# Patient Record
Sex: Male | Born: 1940 | Race: White | Hispanic: No | Marital: Married | Smoking: Current every day smoker
Health system: Southern US, Community
[De-identification: ages and names within clinical notes are randomized; demographics above are authoritative.]

## PROBLEM LIST (undated history)

## (undated) DIAGNOSIS — F419 Anxiety disorder, unspecified: Secondary | ICD-10-CM

## (undated) DIAGNOSIS — F329 Major depressive disorder, single episode, unspecified: Secondary | ICD-10-CM

## (undated) DIAGNOSIS — I219 Acute myocardial infarction, unspecified: Secondary | ICD-10-CM

## (undated) DIAGNOSIS — Z7901 Long term (current) use of anticoagulants: Secondary | ICD-10-CM

## (undated) DIAGNOSIS — I10 Essential (primary) hypertension: Secondary | ICD-10-CM

## (undated) DIAGNOSIS — I2510 Atherosclerotic heart disease of native coronary artery without angina pectoris: Secondary | ICD-10-CM

## (undated) DIAGNOSIS — D689 Coagulation defect, unspecified: Secondary | ICD-10-CM

## (undated) DIAGNOSIS — K219 Gastro-esophageal reflux disease without esophagitis: Secondary | ICD-10-CM

## (undated) DIAGNOSIS — I639 Cerebral infarction, unspecified: Secondary | ICD-10-CM

## (undated) DIAGNOSIS — C61 Malignant neoplasm of prostate: Secondary | ICD-10-CM

## (undated) DIAGNOSIS — F32A Depression, unspecified: Secondary | ICD-10-CM

## (undated) DIAGNOSIS — G5600 Carpal tunnel syndrome, unspecified upper limb: Secondary | ICD-10-CM

## (undated) DIAGNOSIS — M1990 Unspecified osteoarthritis, unspecified site: Secondary | ICD-10-CM

## (undated) DIAGNOSIS — E785 Hyperlipidemia, unspecified: Secondary | ICD-10-CM

## (undated) DIAGNOSIS — I82409 Acute embolism and thrombosis of unspecified deep veins of unspecified lower extremity: Secondary | ICD-10-CM

## (undated) DIAGNOSIS — Z720 Tobacco use: Secondary | ICD-10-CM

## (undated) HISTORY — PX: IM NAILING HUMERUS: SUR733

## (undated) HISTORY — PX: OTHER SURGICAL HISTORY: SHX169

## (undated) HISTORY — DX: Long term (current) use of anticoagulants: Z79.01

## (undated) HISTORY — PX: COLONOSCOPY: SHX174

## (undated) HISTORY — DX: Carpal tunnel syndrome, unspecified upper limb: G56.00

## (undated) HISTORY — DX: Atherosclerotic heart disease of native coronary artery without angina pectoris: I25.10

## (undated) HISTORY — DX: Coagulation defect, unspecified: D68.9

## (undated) HISTORY — DX: Anxiety disorder, unspecified: F41.9

## (undated) HISTORY — DX: Gastro-esophageal reflux disease without esophagitis: K21.9

## (undated) HISTORY — PX: PROSTATE BIOPSY: SHX241

## (undated) HISTORY — DX: Unspecified osteoarthritis, unspecified site: M19.90

## (undated) HISTORY — DX: Essential (primary) hypertension: I10

## (undated) HISTORY — DX: Tobacco use: Z72.0

## (undated) HISTORY — PX: INGUINAL HERNIA REPAIR: SHX194

## (undated) HISTORY — DX: Depression, unspecified: F32.A

## (undated) HISTORY — DX: Acute embolism and thrombosis of unspecified deep veins of unspecified lower extremity: I82.409

## (undated) HISTORY — DX: Hyperlipidemia, unspecified: E78.5

## (undated) HISTORY — DX: Major depressive disorder, single episode, unspecified: F32.9

## (undated) HISTORY — PX: ROTATOR CUFF REPAIR: SHX139

---

## 1986-01-10 HISTORY — PX: BACK SURGERY: SHX140

## 2000-04-17 ENCOUNTER — Encounter (HOSPITAL_COMMUNITY): Admission: RE | Admit: 2000-04-17 | Discharge: 2000-05-17 | Payer: Self-pay | Admitting: Unknown Physician Specialty

## 2000-05-18 ENCOUNTER — Encounter (HOSPITAL_COMMUNITY): Admission: RE | Admit: 2000-05-18 | Discharge: 2000-06-17 | Payer: Self-pay | Admitting: Unknown Physician Specialty

## 2000-06-19 ENCOUNTER — Encounter (HOSPITAL_COMMUNITY): Admission: RE | Admit: 2000-06-19 | Discharge: 2000-07-19 | Payer: Self-pay | Admitting: Unknown Physician Specialty

## 2000-06-23 ENCOUNTER — Ambulatory Visit (HOSPITAL_COMMUNITY): Admission: RE | Admit: 2000-06-23 | Discharge: 2000-06-23 | Payer: Self-pay | Admitting: Internal Medicine

## 2000-06-23 ENCOUNTER — Encounter: Payer: Self-pay | Admitting: Internal Medicine

## 2000-08-25 ENCOUNTER — Encounter: Payer: Self-pay | Admitting: Internal Medicine

## 2000-08-25 ENCOUNTER — Ambulatory Visit (HOSPITAL_COMMUNITY): Admission: RE | Admit: 2000-08-25 | Discharge: 2000-08-25 | Payer: Self-pay | Admitting: Internal Medicine

## 2000-10-19 ENCOUNTER — Ambulatory Visit (HOSPITAL_BASED_OUTPATIENT_CLINIC_OR_DEPARTMENT_OTHER): Admission: RE | Admit: 2000-10-19 | Discharge: 2000-10-19 | Payer: Self-pay | Admitting: Orthopedic Surgery

## 2000-10-25 ENCOUNTER — Ambulatory Visit (HOSPITAL_BASED_OUTPATIENT_CLINIC_OR_DEPARTMENT_OTHER): Admission: RE | Admit: 2000-10-25 | Discharge: 2000-10-26 | Payer: Self-pay | Admitting: Orthopedic Surgery

## 2000-10-29 ENCOUNTER — Encounter: Payer: Self-pay | Admitting: Emergency Medicine

## 2000-10-29 ENCOUNTER — Inpatient Hospital Stay (HOSPITAL_COMMUNITY): Admission: EM | Admit: 2000-10-29 | Discharge: 2000-11-06 | Payer: Self-pay | Admitting: Emergency Medicine

## 2000-12-04 ENCOUNTER — Encounter: Payer: Self-pay | Admitting: Internal Medicine

## 2000-12-04 ENCOUNTER — Ambulatory Visit (HOSPITAL_COMMUNITY): Admission: RE | Admit: 2000-12-04 | Discharge: 2000-12-04 | Payer: Self-pay | Admitting: Internal Medicine

## 2001-01-04 ENCOUNTER — Ambulatory Visit (HOSPITAL_BASED_OUTPATIENT_CLINIC_OR_DEPARTMENT_OTHER): Admission: RE | Admit: 2001-01-04 | Discharge: 2001-01-04 | Payer: Self-pay | Admitting: Orthopedic Surgery

## 2001-02-12 ENCOUNTER — Encounter: Payer: Self-pay | Admitting: Internal Medicine

## 2001-02-12 ENCOUNTER — Ambulatory Visit (HOSPITAL_COMMUNITY): Admission: RE | Admit: 2001-02-12 | Discharge: 2001-02-12 | Payer: Self-pay | Admitting: Internal Medicine

## 2001-07-11 ENCOUNTER — Ambulatory Visit (HOSPITAL_COMMUNITY): Admission: RE | Admit: 2001-07-11 | Discharge: 2001-07-11 | Payer: Self-pay | Admitting: Internal Medicine

## 2001-07-11 ENCOUNTER — Encounter: Payer: Self-pay | Admitting: Internal Medicine

## 2001-09-07 ENCOUNTER — Emergency Department (HOSPITAL_COMMUNITY): Admission: EM | Admit: 2001-09-07 | Discharge: 2001-09-07 | Payer: Self-pay | Admitting: Emergency Medicine

## 2001-09-07 ENCOUNTER — Encounter: Payer: Self-pay | Admitting: Emergency Medicine

## 2002-01-10 HISTORY — PX: CARDIAC CATHETERIZATION: SHX172

## 2002-01-10 HISTORY — PX: TOTAL HIP ARTHROPLASTY: SHX124

## 2003-11-19 ENCOUNTER — Ambulatory Visit (HOSPITAL_COMMUNITY): Admission: RE | Admit: 2003-11-19 | Discharge: 2003-11-19 | Payer: Self-pay | Admitting: Internal Medicine

## 2004-03-19 ENCOUNTER — Ambulatory Visit (HOSPITAL_COMMUNITY): Admission: RE | Admit: 2004-03-19 | Discharge: 2004-03-19 | Payer: Self-pay | Admitting: Internal Medicine

## 2004-06-22 ENCOUNTER — Ambulatory Visit (HOSPITAL_COMMUNITY): Admission: RE | Admit: 2004-06-22 | Discharge: 2004-06-22 | Payer: Self-pay | Admitting: Internal Medicine

## 2004-07-15 ENCOUNTER — Ambulatory Visit (HOSPITAL_COMMUNITY): Admission: RE | Admit: 2004-07-15 | Discharge: 2004-07-15 | Payer: Self-pay | Admitting: Internal Medicine

## 2004-09-03 ENCOUNTER — Ambulatory Visit (HOSPITAL_COMMUNITY): Admission: RE | Admit: 2004-09-03 | Discharge: 2004-09-03 | Payer: Self-pay | Admitting: Internal Medicine

## 2004-09-22 ENCOUNTER — Ambulatory Visit (HOSPITAL_COMMUNITY): Admission: RE | Admit: 2004-09-22 | Discharge: 2004-09-22 | Payer: Self-pay | Admitting: Internal Medicine

## 2005-01-11 ENCOUNTER — Ambulatory Visit (HOSPITAL_COMMUNITY): Admission: RE | Admit: 2005-01-11 | Discharge: 2005-01-11 | Payer: Self-pay | Admitting: Internal Medicine

## 2005-03-29 ENCOUNTER — Emergency Department (HOSPITAL_COMMUNITY): Admission: EM | Admit: 2005-03-29 | Discharge: 2005-03-29 | Payer: Self-pay | Admitting: Emergency Medicine

## 2005-05-25 ENCOUNTER — Ambulatory Visit: Payer: Self-pay | Admitting: Family Medicine

## 2005-05-30 ENCOUNTER — Ambulatory Visit (HOSPITAL_COMMUNITY): Admission: RE | Admit: 2005-05-30 | Discharge: 2005-05-30 | Payer: Self-pay | Admitting: Family Medicine

## 2005-05-31 ENCOUNTER — Ambulatory Visit: Payer: Self-pay | Admitting: Family Medicine

## 2005-06-23 ENCOUNTER — Ambulatory Visit: Payer: Self-pay | Admitting: Family Medicine

## 2005-06-29 ENCOUNTER — Encounter (HOSPITAL_COMMUNITY): Admission: RE | Admit: 2005-06-29 | Discharge: 2005-07-29 | Payer: Self-pay | Admitting: Family Medicine

## 2005-07-21 ENCOUNTER — Ambulatory Visit (HOSPITAL_COMMUNITY): Admission: RE | Admit: 2005-07-21 | Discharge: 2005-07-21 | Payer: Self-pay | Admitting: Internal Medicine

## 2005-08-09 ENCOUNTER — Encounter (HOSPITAL_COMMUNITY): Admission: RE | Admit: 2005-08-09 | Discharge: 2005-09-08 | Payer: Self-pay | Admitting: Family Medicine

## 2006-02-14 ENCOUNTER — Encounter: Payer: Self-pay | Admitting: Family Medicine

## 2006-02-14 DIAGNOSIS — M1990 Unspecified osteoarthritis, unspecified site: Secondary | ICD-10-CM | POA: Insufficient documentation

## 2006-02-14 DIAGNOSIS — G5600 Carpal tunnel syndrome, unspecified upper limb: Secondary | ICD-10-CM | POA: Insufficient documentation

## 2006-08-14 ENCOUNTER — Ambulatory Visit (HOSPITAL_COMMUNITY): Admission: RE | Admit: 2006-08-14 | Discharge: 2006-08-14 | Payer: Self-pay | Admitting: Internal Medicine

## 2007-08-08 ENCOUNTER — Inpatient Hospital Stay (HOSPITAL_COMMUNITY): Admission: AD | Admit: 2007-08-08 | Discharge: 2007-08-09 | Payer: Self-pay | Admitting: Pulmonary Disease

## 2008-06-03 ENCOUNTER — Encounter: Payer: Self-pay | Admitting: Cardiology

## 2008-06-03 ENCOUNTER — Ambulatory Visit: Payer: Self-pay | Admitting: Cardiology

## 2008-06-03 ENCOUNTER — Ambulatory Visit (HOSPITAL_COMMUNITY): Admission: RE | Admit: 2008-06-03 | Discharge: 2008-06-03 | Payer: Self-pay | Admitting: Cardiology

## 2008-06-06 ENCOUNTER — Telehealth: Payer: Self-pay | Admitting: Cardiology

## 2008-06-10 ENCOUNTER — Ambulatory Visit: Payer: Self-pay | Admitting: Cardiovascular Disease

## 2008-06-10 ENCOUNTER — Inpatient Hospital Stay (HOSPITAL_BASED_OUTPATIENT_CLINIC_OR_DEPARTMENT_OTHER): Admission: RE | Admit: 2008-06-10 | Discharge: 2008-06-10 | Payer: Self-pay | Admitting: Cardiovascular Disease

## 2008-06-12 ENCOUNTER — Inpatient Hospital Stay (HOSPITAL_COMMUNITY): Admission: RE | Admit: 2008-06-12 | Discharge: 2008-06-13 | Payer: Self-pay | Admitting: Cardiovascular Disease

## 2008-06-12 ENCOUNTER — Ambulatory Visit: Payer: Self-pay | Admitting: Cardiovascular Disease

## 2008-06-16 ENCOUNTER — Telehealth (INDEPENDENT_AMBULATORY_CARE_PROVIDER_SITE_OTHER): Payer: Self-pay | Admitting: *Deleted

## 2008-06-17 ENCOUNTER — Ambulatory Visit (HOSPITAL_COMMUNITY): Admission: RE | Admit: 2008-06-17 | Discharge: 2008-06-17 | Payer: Self-pay | Admitting: Cardiology

## 2008-06-18 ENCOUNTER — Encounter (INDEPENDENT_AMBULATORY_CARE_PROVIDER_SITE_OTHER): Payer: Self-pay | Admitting: Nurse Practitioner

## 2008-06-20 ENCOUNTER — Ambulatory Visit (HOSPITAL_COMMUNITY): Admission: RE | Admit: 2008-06-20 | Discharge: 2008-06-20 | Payer: Self-pay | Admitting: Cardiology

## 2008-06-23 LAB — CONVERTED CEMR LAB
BUN: 22 mg/dL (ref 6–23)
Calcium: 8.6 mg/dL (ref 8.4–10.5)
Chloride: 103 meq/L (ref 96–112)
Glucose, Bld: 103 mg/dL — ABNORMAL HIGH (ref 70–99)
Potassium: 4.3 meq/L (ref 3.5–5.3)
Sodium: 139 meq/L (ref 135–145)

## 2008-06-25 ENCOUNTER — Ambulatory Visit: Payer: Self-pay | Admitting: Cardiology

## 2008-07-01 ENCOUNTER — Encounter: Payer: Self-pay | Admitting: Cardiology

## 2008-07-18 ENCOUNTER — Encounter: Payer: Self-pay | Admitting: Cardiology

## 2008-07-22 ENCOUNTER — Telehealth (INDEPENDENT_AMBULATORY_CARE_PROVIDER_SITE_OTHER): Payer: Self-pay

## 2008-07-25 ENCOUNTER — Telehealth: Payer: Self-pay | Admitting: Cardiology

## 2008-08-25 ENCOUNTER — Encounter: Payer: Self-pay | Admitting: *Deleted

## 2008-09-08 ENCOUNTER — Ambulatory Visit: Payer: Self-pay

## 2008-09-22 ENCOUNTER — Ambulatory Visit (HOSPITAL_COMMUNITY): Admission: RE | Admit: 2008-09-22 | Discharge: 2008-09-22 | Payer: Self-pay | Admitting: Internal Medicine

## 2008-09-22 ENCOUNTER — Encounter: Payer: Self-pay | Admitting: Orthopedic Surgery

## 2008-09-23 ENCOUNTER — Ambulatory Visit: Payer: Self-pay | Admitting: Orthopedic Surgery

## 2008-09-23 DIAGNOSIS — S92919B Unspecified fracture of unspecified toe(s), initial encounter for open fracture: Secondary | ICD-10-CM | POA: Insufficient documentation

## 2008-09-25 ENCOUNTER — Ambulatory Visit: Payer: Self-pay | Admitting: Orthopedic Surgery

## 2008-09-25 ENCOUNTER — Encounter: Payer: Self-pay | Admitting: Cardiology

## 2008-09-26 LAB — CONVERTED CEMR LAB
ALT: 16 units/L (ref 0–53)
AST: 22 units/L (ref 0–37)
Bilirubin, Direct: <0.1 mg/dL (ref 0.0–0.3)
Cholesterol: 145 mg/dL (ref 0–200)
Total Bilirubin: 0.3 mg/dL (ref 0.3–1.2)
Triglycerides: 206 mg/dL — ABNORMAL HIGH (ref <150)
VLDL: 41 mg/dL — ABNORMAL HIGH (ref 0–40)

## 2008-10-09 ENCOUNTER — Ambulatory Visit: Payer: Self-pay | Admitting: Orthopedic Surgery

## 2008-10-13 ENCOUNTER — Telehealth: Payer: Self-pay | Admitting: Orthopedic Surgery

## 2008-10-15 ENCOUNTER — Ambulatory Visit: Payer: Self-pay | Admitting: Orthopedic Surgery

## 2008-10-16 ENCOUNTER — Encounter: Payer: Self-pay | Admitting: Orthopedic Surgery

## 2008-11-19 ENCOUNTER — Ambulatory Visit: Payer: Self-pay | Admitting: Cardiology

## 2008-11-27 ENCOUNTER — Ambulatory Visit: Payer: Self-pay | Admitting: Cardiology

## 2009-01-08 ENCOUNTER — Ambulatory Visit (HOSPITAL_COMMUNITY): Admission: RE | Admit: 2009-01-08 | Discharge: 2009-01-08 | Payer: Self-pay | Admitting: Internal Medicine

## 2009-01-12 ENCOUNTER — Telehealth: Payer: Self-pay | Admitting: Cardiology

## 2009-02-04 ENCOUNTER — Ambulatory Visit: Payer: Self-pay | Admitting: Cardiology

## 2009-02-12 ENCOUNTER — Telehealth (INDEPENDENT_AMBULATORY_CARE_PROVIDER_SITE_OTHER): Payer: Self-pay | Admitting: *Deleted

## 2009-02-13 ENCOUNTER — Telehealth (INDEPENDENT_AMBULATORY_CARE_PROVIDER_SITE_OTHER): Payer: Self-pay | Admitting: *Deleted

## 2009-05-06 ENCOUNTER — Telehealth (INDEPENDENT_AMBULATORY_CARE_PROVIDER_SITE_OTHER): Payer: Self-pay | Admitting: *Deleted

## 2009-05-14 ENCOUNTER — Ambulatory Visit: Admission: RE | Admit: 2009-05-14 | Discharge: 2009-05-14 | Payer: Self-pay | Admitting: Internal Medicine

## 2009-07-02 ENCOUNTER — Ambulatory Visit (HOSPITAL_COMMUNITY): Admission: RE | Admit: 2009-07-02 | Discharge: 2009-07-02 | Payer: Self-pay | Admitting: Internal Medicine

## 2009-07-31 ENCOUNTER — Ambulatory Visit (HOSPITAL_COMMUNITY)
Admission: RE | Admit: 2009-07-31 | Discharge: 2009-07-31 | Payer: Self-pay | Source: Home / Self Care | Admitting: Internal Medicine

## 2009-08-28 ENCOUNTER — Ambulatory Visit: Payer: Self-pay | Admitting: Cardiology

## 2009-08-28 ENCOUNTER — Encounter: Payer: Self-pay | Admitting: Adult Health

## 2009-09-25 ENCOUNTER — Telehealth (INDEPENDENT_AMBULATORY_CARE_PROVIDER_SITE_OTHER): Payer: Self-pay | Admitting: *Deleted

## 2009-10-29 ENCOUNTER — Telehealth (INDEPENDENT_AMBULATORY_CARE_PROVIDER_SITE_OTHER): Payer: Self-pay | Admitting: *Deleted

## 2009-11-30 ENCOUNTER — Encounter: Payer: Self-pay | Admitting: Adult Health

## 2009-11-30 LAB — CONVERTED CEMR LAB
AST: 21 units/L (ref 0–37)
HDL: 34 mg/dL — ABNORMAL LOW (ref >39)
Indirect Bilirubin: 0.2 mg/dL (ref 0.0–0.9)
LDL Cholesterol: 114 mg/dL — ABNORMAL HIGH (ref 0–99)
Total Bilirubin: 0.3 mg/dL (ref 0.3–1.2)
Total Protein: 6.6 g/dL (ref 6.0–8.3)
Triglycerides: 116 mg/dL (ref <150)
VLDL: 23 mg/dL (ref 0–40)

## 2010-01-07 ENCOUNTER — Ambulatory Visit: Payer: Self-pay | Admitting: Cardiology

## 2010-01-18 ENCOUNTER — Ambulatory Visit
Admission: RE | Admit: 2010-01-18 | Discharge: 2010-01-18 | Payer: Self-pay | Source: Home / Self Care | Attending: Cardiology | Admitting: Cardiology

## 2010-01-19 ENCOUNTER — Encounter: Payer: Self-pay | Admitting: Cardiology

## 2010-01-26 ENCOUNTER — Ambulatory Visit (HOSPITAL_COMMUNITY)
Admission: RE | Admit: 2010-01-26 | Discharge: 2010-01-26 | Payer: Self-pay | Source: Home / Self Care | Attending: Internal Medicine | Admitting: Internal Medicine

## 2010-01-27 ENCOUNTER — Ambulatory Visit
Admission: RE | Admit: 2010-01-27 | Discharge: 2010-01-27 | Payer: Self-pay | Source: Home / Self Care | Attending: Orthopedic Surgery | Admitting: Orthopedic Surgery

## 2010-01-27 ENCOUNTER — Encounter: Payer: Self-pay | Admitting: Orthopedic Surgery

## 2010-01-27 DIAGNOSIS — S61209A Unspecified open wound of unspecified finger without damage to nail, initial encounter: Secondary | ICD-10-CM | POA: Insufficient documentation

## 2010-01-27 DIAGNOSIS — S62639B Displaced fracture of distal phalanx of unspecified finger, initial encounter for open fracture: Secondary | ICD-10-CM | POA: Insufficient documentation

## 2010-01-28 ENCOUNTER — Encounter: Payer: Self-pay | Admitting: Orthopedic Surgery

## 2010-01-30 ENCOUNTER — Encounter: Payer: Self-pay | Admitting: Internal Medicine

## 2010-01-31 ENCOUNTER — Encounter: Payer: Self-pay | Admitting: Internal Medicine

## 2010-02-03 ENCOUNTER — Ambulatory Visit
Admission: RE | Admit: 2010-02-03 | Discharge: 2010-02-03 | Payer: Self-pay | Source: Home / Self Care | Attending: Orthopedic Surgery | Admitting: Orthopedic Surgery

## 2010-02-07 LAB — CONVERTED CEMR LAB
CRP: 0.1 mg/dL (ref <0.6)
Eosinophils Absolute: 0.4 10*3/uL (ref 0.0–0.7)
Eosinophils Relative: 7 % — ABNORMAL HIGH (ref 0–5)
HCT: 42.1 % (ref 39.0–52.0)
Lymphs Abs: 2.3 10*3/uL (ref 0.7–4.0)
Neutrophils Relative %: 36 % — ABNORMAL LOW (ref 43–77)
Platelets: 174 10*3/uL (ref 150–400)
RBC: 4.38 M/uL (ref 4.22–5.81)
WBC: 5.3 10*3/uL (ref 4.0–10.5)

## 2010-02-09 NOTE — Progress Notes (Signed)
Summary: Nurse visit  Phone Note Call from Patient   Caller: Patient Reason for Call: Talk to Nurse Summary of Call: pt came for nurse visit last week and states he has not heard back from nurse/pt states that he will call back to speak w/nurse around 4pm/tg  Initial call taken by: Raechel Ache [facility],  February 12, 2009 12:28 PM  Follow-up for Phone Call        pt's number has been disconnected, awaitng pt's return call, will not leave his cell         [phone] Follow-up by: Teressa Lower RN,  February 12, 2009 1:54 PM  Additional Follow-up for Phone Call Additional follow up Details #1::        I spoke with pt 02/12/2009 at 5:15pm.  No new orders per Dr. Dietrich Pates. I made pt aware and he stated he is still very dizzy when he looks up. Additional Follow-up by: Teressa Lower RN,  February 13, 2009 9:02 AM

## 2010-02-09 NOTE — Letter (Signed)
Summary: History form  History form   Imported By: Jacklynn Ganong 09/30/2008 11:02:04  _____________________________________________________________________  External Attachment:    Type:   Image     Comment:   External Document

## 2010-02-09 NOTE — Progress Notes (Signed)
Summary: RX ORDER  Phone Note Call from Patient Call back at Home Phone [phone]   Caller: PT Reason for Call: Refill Medication Summary of Call: S: PT NEED PLAVIX 75 MG ORDERED FROM COMPANY.  HE HAS ABOUT TWO WEEKS LEFT "RUNNING LOWERE THEN HE THOUGHT" Initial call taken by: Faythe Ghee,  May 06, 2009 11:16 AM  Follow-up for Phone Call        ordered by S. Neugent,CNA from [employer] assist drug program, pt will receive in 7-10days Follow-up by: Teressa Lower RN,  May 06, 2009 4:47 PM

## 2010-02-09 NOTE — Progress Notes (Signed)
Summary: RX CORRECTION  Phone Note Call from Patient Call back at Home Phone [phone]   Reason for Call: Refill Medication Summary of Call: PT JUST REALIZED THAT HE HAS STILL BEEN TAKING TWO PILLS OF TRAMIDAL AND IS ONLY SUPOSE TO BE TAKING ONE HE HAS RUN OUT OF RX AND NEED NEW CALLED IN WITH CORRECT DIRECTIONS ON IT. PT US[place] [facility] [place] . Initial call taken by: Faythe Ghee,  October 29, 2009 3:46 PM  Follow-up for Phone Call        per office visit on 08/28/2009, refills must be provided by PCP Follow-up by: Teressa Lower RN,  October 30, 2009 8:34 AM

## 2010-02-09 NOTE — Assessment & Plan Note (Signed)
Summary: past due for f/u per checkout on 08/10/09/tg   Visit Type:  Follow-up Referring Provider:  Dr. Carylon Perches Primary Provider:  Dr.Roy Fagen  CC:  some chest pain off and on the last 6 weeks.  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 Clifton James on 06/10/2008.  The patient was taken off of coumadin (which he was taking secondary to DVT of L leg) and placed on Plavix and ASA on discharge. He was placed on Coreg 3.125 two times a day on discharge and ACE inhibitor was planned to be added at a later date on follow-up.  He unfortunately continues to smoke 1ppd.  He is under enormous amount of stress concenring family issues with children and talks very open about this. He states the stress causes him to have chest discomfort at times.  He will take a NTG on occasion to assist with this.  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  He is also starting to drink a glass or two of wine each night to assist with family stresses.  Current Medications (verified): 1)  Clonazepam 0.5 Mg Tabs (Clonazepam) .... Three Times A Day 2)  Chelated Potassium 99 Mg Tabs (Potassium) .... Take 1 Tablet By Mouth Once A Day 3)  Buspar 15 Mg Tabs (Buspirone Hcl) .... Take 1/2 Tab Two Times A Day 4)  Aspirin Ec 325 Mg Tbec (Aspirin) .... Take One Tablet By Mouth Daily 5)  Nitroglycerin 0.4 Mg Subl (Nitroglycerin) .... One Tablet Under Tongue Every 5 Minutes As Needed For Chest Pain---May Repeat Times Three 6)  Simvastatin 40 Mg Tabs (Simvastatin) .... Take One Tablet By Mouth Daily At Bedtime 7)  Carvedilol 3.125 Mg Tabs (Carvedilol) .... Take One Tablet By Mouth Twice A Day 8)  Plavix 75 Mg Tabs (Clopidogrel Bisulfate) .... Take 1 Tab Daily 9)  Lisinopril 10 Mg Tabs (Lisinopril) .... Take 1 Tablet By Mouth Once Daily 10)  Tramadol Hcl 50 Mg Tabs (Tramadol Hcl) ....  Take 1 Tab Three Times A Day 11)  Fluoxetine Hcl 40 Mg Caps (Fluoxetine Hcl) .... Take 1 Tab Daily 12)  Protonix 40 Mg Tbec (Pantoprazole Sodium) .... Take 1 Tablet By Mouth At Bedtime  Allergies (verified): 1)  ! * Cdn  Past History:  Past medical, surgical, family and social histories (including risk factors) reviewed, and no changes noted (except as noted below).  Past Medical History: Reviewed history from 06/24/2008 and no changes required. Anxiety Depression DVT, hx of Hyperlipidemia Osteoarthritis Current Problems:  GERD (ICD-530.81) DVT (ICD-453.40) ANGINA, HX OF (ICD-V12.50) EMBOLISM, OB BLOOD-CLOT , UNSPECIFIED EPISODE (ICD-673.20) DISORDER, TOBACCO USE (ICD-305.1) AFTERCARE, LONG-TERM USE, ANTICOAGULANTS (ICD-V58.61) SYNDROME, CARPAL TUNNEL (ICD-354.0) OSTEOARTHRITIS (ICD-715.90) HYPERLIPIDEMIA (ICD-272.4) DVT, HX OF (ICD-V12.51) DEPRESSION (ICD-311) ANXIETY (ICD-300.00)  Past Surgical History: Reviewed history from 06/24/2008 and no changes required. Inguinal herniorrhaphy Rotator cuff repair orif colonoscopy  Family History: Reviewed history from 06/24/2008 and no changes required. Father:deceased age 67 due to massive mi Mother:deceased age 40 due to complications of coronary artery bypass graft times 5  Social History: Reviewed history from 06/24/2008 and no changes required. no syblings  Full Time Married  Tobacco Use - Yes. 1 pack daily Alcohol Use - no Regular Exercise - yes Drug Use - no patient has 3 children  Review of Systems  Situational stress and anxiety  All other systems have been reviewed and are negative unless stated above.   Vital Signs:  Patient profile:   70 year old male Weight:      195 pounds BMI:     27.30 Pulse rate:   52 / minute BP sitting:   145 / 80  (right arm)  Vitals Entered By: Alex Saa, CNA (August 28, 2009 10:50 AM)  Physical Exam  General:  Well developed, well nourished, in no acute  distress. Mouth:  poor dentition.   Lungs:  Some bibasilar crackles, no wheezes. Heart:  Non-displaced PMI, chest non-tender; regular rate and rhythm, S1, S2 without murmurs, rubs or gallops. Carotid upstroke normal, no bruit. Normal abdominal aortic size, no bruits. Femorals normal pulses, no bruits. Pedals normal pulses. No edema, no varicosities. Abdomen:  Bowel sounds positive; abdomen soft and non-tender without masses, organomegaly, or hernias noted. No hepatosplenomegaly. Msk:  Pain on occasion with movement and chronic back and shoulder pain. Pulses:  pulses normal in all 4 extremities Extremities:  Multiple bruises and scratches from "working in my yard with my chickens." Neurologic:  Alert and oriented x 3. Some HOH Psych:  anxious.     EKG  Procedure date:  08/28/2009  Findings:      Normal sinus rhythm with rate of:53 bpm Sinus bradycardia with some sinus arrythimia. Asymptomatic  Impression & Recommendations:  Problem # 1:  CAD, NATIVE VESSEL (ICD-414.01) He appears to be having stress induced angina symptoms.  He is not complaining of exerional chest pain.  He will continue on his BB and I have given him refills of NTG.  He is to do his best to eliminate stress of family issues as he can.  I have explained the affect of stress on coronary artery disease.  I have advised him not to overindulge on wine. His updated medication list for this problem includes:    Aspirin Ec 325 Mg Tbec (Aspirin) .Marland Kitchen... Take one tablet by mouth daily    Nitroglycerin 0.4 Mg Subl (Nitroglycerin) ..... One tablet under tongue every 5 minutes as needed for chest pain---may repeat times three    Carvedilol 3.125 Mg Tabs (Carvedilol) .Marland Kitchen... Take one tablet by mouth twice a day    Plavix 75 Mg Tabs (Clopidogrel bisulfate) .Marland Kitchen... Take 1 tab daily    Lisinopril 10 Mg Tabs (Lisinopril) .Marland Kitchen... Take 1 tablet by mouth once daily  Problem # 2:  TOBACCO ABUSE (ICD-305.1) Assessment: Comment Only Counselling is  completed.  No plans to change this.  Problem # 3:  OSTEOARTHRITIS (ICD-715.90) I have increased his tramadol to 100mg  three times a day but advised him to see primary care physician for continued refills on this.  Other Orders: Future Orders: T-Lipid Profile ([id number]) ... 11/30/2009 T-Hepatic Function [id number]) ... 11/30/2009  Patient Instructions: 1)  Your physician recommends that you schedule a follow-up appointment in: 6 months 2)  Your physician recommends that you return for lab work in: 3 months 3)  Your physician has recommended you make the following change in your medication: start taking Protonix 40mg  by mouth at bedtime and we refilled your Tramadol for a 1 month supply, please refer to you primary MD to continue refills. Prescriptions: PROTONIX 40 MG TBEC (PANTOPRAZOLE SODIUM) take 1 tablet by mouth at bedtime  #30 x 6   Entered by:   Alex Lara   Authorized by:   Alex Reining, Alex Lara   Signed by:   Alex Lara on  08/28/2009   Method used:   Electronically to        [facility]  [address] (retail)       [address]       [place], [place]  [address]       Ph: [phone]       Fax: [phone]   RxID:   [id number] TRAMADOL HCL 50 MG TABS (TRAMADOL HCL) take 1 tab three times a day  #90 x 0   Entered by:   Alex Lara   Authorized by:   Alex Reining, Alex Lara   Signed by:   Alex Lara on 84/13/2440   Method used:   Electronically to        [facility]  [address] (retail)       [address]       [place], [place]  [address]       Ph: [phone]       Fax: [phone]   RxID:   [id number] LISINOPRIL 10 MG TABS (LISINOPRIL) Take 1 tablet by mouth once daily  #30 x 6   Entered by:   Alex Lara   Authorized by:   Alex Reining, Alex Lara   Signed by:   Alex Lara on 30/16/0109   Method used:   Electronically to        [facility]  [address] (retail)       [address]       [place],  [place]  [address]       Ph: [phone]       Fax: [phone]   RxID:   [id number] NITROGLYCERIN 0.4 MG SUBL (NITROGLYCERIN) One tablet under tongue every 5 minutes as needed for chest pain---may repeat times three  #25 x 3   Entered by:   Alex Lara   Authorized by:   Alex Reining, Alex Lara   Signed by:   Alex Lara on 48/54/6270   Method used:   Electronically to        [facility]  [address] (retail)       [address]       [place], [place]  [address]       Ph: [phone]       Fax: [phone]   RxID:   [id number]

## 2010-02-09 NOTE — Progress Notes (Signed)
  Phone Note Call from Patient   Caller: Patient Call For: dizzy Summary of Call: pt states that when he looks up or down, he gets dizzy and sometimes has nausea.  I suggested he see his pcp, he stated he had been treated for " inner ear" in the past.  He also stated that when he felt this way he would take an extra lisinopril and I asked him to stop that practice.  He stated he would make an appt with Dr. Ouida Sills Initial call taken by: Teressa Lower RN,  February 13, 2009 9:24 AM  Follow-up for Phone Call        Dulce work.  I agree. Follow-up by: Kathlen Brunswick, MD, [facility],  February 13, 2009 9:39 AM

## 2010-02-09 NOTE — Letter (Signed)
Summary: [facility] [employer], [employer] at [employer]  [address], [place] [address]   Phone: [phone]  Fax: [phone]     August 28, 2009 MRN: [id number]   Alex Lara [address] [place], [place]  [address]      YOUR LAB WORK IS DUE  November 30, 2009 _________________________________________  Please go to [facility], located across the street from [facility] on the second floor.  Hours are Monday - Friday 7am until 7:30pm         Saturday 8am until 12noon    _X_  DO NOT EAT OR DRINK AFTER MIDNIGHT EVENING PRIOR TO LABWORK  __ YOUR LABWORK IS NOT FASTING --YOU MAY EAT PRIOR TO LABWORK

## 2010-02-09 NOTE — Progress Notes (Signed)
  Phone Note Call from Patient   Caller: Patient Reason for Call: Talk to Nurse Summary of Call: Yusef said there is no need for you, Tammy, to call Dr. Corie Chiquito office about refilling the tramodal.  They will not refill untill the 31st.  Dallyn wanted to know if Samara Deist would give him a rx for the tramodal.  If she will not no further action is required.  His phone number is [phone]. Initial call taken by: Alexis Goodell  Follow-up for Phone Call        pt had a rx for 90pills on 08/28/09, no refill Follow-up by: Teressa Lower RN,  September 25, 2009 1:16 PM

## 2010-02-09 NOTE — Progress Notes (Signed)
Summary: Question regarding Plavix  Phone Note Call from Patient Call back at [phone]   Caller: Patient Reason for Call: Talk to Nurse Summary of Call: pt wants to know how long he needs to be off of coumadin prior to going to dentist/tg Initial call taken by: Raechel Ache [facility],  January 12, 2009 2:02 PM  Follow-up for Phone Call        pt does not have to stop plavix for dentist per new guidelines Follow-up by: Teressa Lower RN,  January 12, 2009 3:25 PM     Appended Document: Question regarding Plavix Overdue for appointment.  Needs to be seen before I can assist with management of his meds.  [place] Bing, M.D.

## 2010-02-09 NOTE — Assessment & Plan Note (Signed)
Summary: bp check per pt request/tg  Nurse Visit   Vital Signs:  Patient profile:   70 year old male Height:      71 inches Weight:      205 pounds O2 Sat:      97 % on Room air Pulse rate:   56 / minute BP sitting:   158 / 71  (left arm)  Vitals Entered By: Teressa Lower RN (February 04, 2009 3:58 PM)  O2 Flow:  Room air  Visit Type:  nurse visit Referring Provider:  Dr. Carylon Perches Primary Provider:  Dr.Roy Fagen   History of Present Illness: pt feeling dizzy when he looks up,having a headache,  pt has a ringing in his ear.  right arm 152/95    58 shortness of breath, nausea ocass.  swelling in left leg   Current Medications (verified): 1)  Clonazepam 0.5 Mg Tabs (Clonazepam) .... Three Times A Day 2)  Paxil 40 Mg Tabs (Paroxetine Hcl) .... Once Daily 3)  Fish Oil 1000 Mg Caps (Omega-3 Fatty Acids) .... Take 1 Tablet By Mouth Once A Day 4)  Chelated Potassium 99 Mg Tabs (Potassium) .... Take 1 Tablet By Mouth Once A Day 5)  Eq Omeprazole 20 Mg Tbec (Omeprazole) .... Take 1 Tablet By Mouth Once A Day 6)  Buspar 15 Mg Tabs (Buspirone Hcl) .... Take 1 Tablet By Mouth Once A Day 7)  Aspirin Ec 325 Mg Tbec (Aspirin) .... Take One Tablet By Mouth Daily 8)  Nitroglycerin 0.4 Mg Subl (Nitroglycerin) .... One Tablet Under Tongue Every 5 Minutes As Needed For Chest Pain---May Repeat Times Three 9)  Simvastatin 40 Mg Tabs (Simvastatin) .... Take One Tablet By Mouth Daily At Bedtime 10)  Carvedilol 3.125 Mg Tabs (Carvedilol) .... Take One Tablet By Mouth Twice A Day 11)  Plavix 75 Mg Tabs (Clopidogrel Bisulfate) .... Take 1 Tab Daily 12)  Lisinopril 10 Mg Tabs (Lisinopril) .... Take 1 Tablet By Mouth Once Daily  Allergies (verified): 1)  ! * Cdn

## 2010-02-11 NOTE — Assessment & Plan Note (Signed)
Summary: fx finger/xr ap/fagan/bsf   Vital Signs:  Patient profile:   70 year old male Height:      72 inches Weight:      202 pounds Pulse rate:   70 / minute Resp:     16 per minute  Vitals Entered By: Fuller Canada MD (January 27, 2010 11:20 AM)  Visit Type:  new problem Referring Provider:  Dr. Carylon Perches Primary Provider:  Dr.Roy Fagen  CC:  left middle finger cut.  History of Present Illness: I saw Alex Lara in the office today for an initial visit.  He is a 70 years old man with the complaint of:  left middle finger injury.  DOI 01/25/10 cut with a skill saw.  Xrays taken [facility] 01/26/10 left middle finger.  PCP gave on Keflex 500mg  one by mouth three times a day for 10 days, will pick up today.  Tramadol for pain, also takes Clonazepam, Lisinopril, Plavix, ASA, Prozac, Meclizine.  Patient injured his finger with a skill saw he has a laceration at the end of the finger.  Looks clean.  Dressed with Xeroform and a gauze and a metal splint.        Allergies: 1)  ! * Cdn  Past History:  Past Surgical History: Inguinal herniorrhaphy stents in heart Rotator cuff repair orif colonoscopy  Social History: no syblings  Married  Tobacco Use - Yes. 1 pack daily Alcohol Use - no Regular Exercise - yes Drug Use - no patient has 3 children 12th grade ed  Review of Systems Constitutional:  Denies weight loss, weight gain, fever, chills, and fatigue. Cardiovascular:  Denies chest pain, palpitations, fainting, and murmurs. Respiratory:  Complains of snoring; denies short of breath, wheezing, couch, tightness, pain on inspiration, and snoring . Gastrointestinal:  Complains of heartburn; denies nausea, vomiting, diarrhea, constipation, and blood in your stools. Genitourinary:  Denies frequency, urgency, difficulty urinating, painful urination, flank pain, and bleeding in urine. Neurologic:  Complains of dizziness; denies numbness, tingling, unsteady gait,  tremors, and seizure. Musculoskeletal:  Complains of muscle pain; denies joint pain, swelling, instability, stiffness, redness, and heat. Endocrine:  Complains of heat or cold intolerance; denies excessive thirst and exessive urination. Psychiatric:  Complains of nervousness, depression, and anxiety; denies hallucinations. Skin:  Denies changes in the skin, poor healing, rash, itching, and redness. HEENT:  Denies blurred or double vision, eye pain, redness, and watering. Immunology:  Denies seasonal allergies, sinus problems, and allergic to bee stings. Hemoatologic:  Complains of brusing; denies easy bleeding.   Other Orders: Est. Patient Level II (78295)  Patient Instructions: 1)  Please schedule a follow-up appointment in 1 week.   Orders Added: 1)  Est. Patient Level II [62130]

## 2010-02-11 NOTE — Letter (Signed)
Summary: History form  History form   Imported By: Jacklynn Ganong 02/02/2010 15:07:27  _____________________________________________________________________  External Attachment:    Type:   Image     Comment:   External Document

## 2010-02-11 NOTE — Assessment & Plan Note (Signed)
Summary: 6 mthf/u per checkout on 08/28/09/tg   Visit Type:  Follow-up Referring Burns Timson:  Dr. Carylon Perches Primary Zaelyn Noack:  Dr.Roy Fagen   History of Present Illness: [name] is a  70 y/o CM with known history ofCAD with DES to RCA and mid LAD in 06/2008, now on ASA and Plavix.  On last visit he complained about stress induced chest pain requiring use of NTG.  He continues to smoke and drink wine (3-4) glasses at night.  He states that his stress level is better with his family and his chest pain is almost completely gone, as he has had to take Ntg twice only since last visit.  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  Preventive Screening-Counseling & Management  Alcohol-Tobacco     Alcohol drinks/day: 3     Alcohol type: wine     Alcohol Counseling: to decrease amount and/or frequency of alcohol intake     Smoking Status: current     Smoking Cessation Counseling: yes     Smoke Cessation Stage: precontemplative     Packs/Day: 1.5  Caffeine-Diet-Exercise     Does Patient Exercise: no     Exercise Counseling: to improve exercise regimen  Current Medications (verified): 1)  Clonazepam 0.5 Mg Tabs (Clonazepam) .... Three Times A Day 2)  Chelated Potassium 99 Mg Tabs (Potassium) .... Take 1 Tablet By Mouth Once A Day 3)  Buspar 15 Mg Tabs (Buspirone Hcl) .... Take 1/2 Tab Two Times A Day 4)  Aspirin Ec 325 Mg Tbec (Aspirin) .... Take One Tablet By Mouth Daily 5)  Nitroglycerin 0.4 Mg Subl (Nitroglycerin) .... One Tablet Under Tongue Every 5 Minutes As Needed For Chest Pain---May Repeat Times Three 6)  Simvastatin 40 Mg Tabs (Simvastatin) .... Take One Tablet By Mouth Daily At Bedtime 7)  Carvedilol 3.125 Mg Tabs (Carvedilol) .... Take One Tablet By Mouth Twice A Day 8)  Plavix 75 Mg Tabs (Clopidogrel Bisulfate) .... Take 1 Tab Daily 9)  Lisinopril 10 Mg Tabs (Lisinopril) .... Take 1 Tablet By Mouth Once Daily 10)  Tramadol Hcl 50 Mg Tabs (Tramadol Hcl)  .... Take 1 Tab Three Times A Day 11)  Fluoxetine Hcl 40 Mg Caps (Fluoxetine Hcl) .... Take 1 Tab Daily 12)  Fish Oil Double Strength 1200 Mg Caps (Omega-3 Fatty Acids) .... Take 1 Cap Daily 13)  Vitamin E 400 Unit Caps (Vitamin E) .... Take 1 Tab Daily 14)  Antivert 25 Mg Tabs (Meclizine Hcl) .... Take 1 Tab Three Times A Day 15)  Prilosec 20 Mg Cpdr (Omeprazole) .... Take Prn  Allergies (verified): 1)  ! * Cdn  Comments:  Nurse/Medical Assistant: patient brought meds reviewed with patient he takes prilosec occasional [facility] in [place] is patients pharmacy  Social History: Alcohol drinks/day:  3 Packs/Day:  1.5 Does Patient Exercise:  no  Review of Systems       All other systems have been reviewed and are negative unless stated above.   Vital Signs:  Patient profile:   70 year old male Weight:      205 pounds BMI:     28.70 Pulse rate:   48 / minute BP sitting:   148 / 87  (right arm)  Vitals Entered By: Dreama Saa, CNA (January 07, 2010 2:46 PM)  Physical Exam  General:  normal appearance.   Lungs:  Some inspiratory wheezing. No crackles. Heart:  Distant without MRG.  RRR. Pulses are palpable. Abdomen:  Bowel  sounds positive; abdomen soft and non-tender without masses, organomegaly, or hernias noted. No hepatosplenomegaly. Msk:  Back normal, normal gait. Muscle strength and tone normal. Pulses:  pulses normal in all 4 extremities Extremities:  No clubbing or cyanosis. Neurologic:  Alert and oriented x 3. Psych:  Normal affect.   Impression & Recommendations:  Problem # 1:  CAD, NATIVE VESSEL (ICD-414.01) He is without complaints of frequent chest pain as he complained about on last visit.  I have told him of my concerns with continued smoking in the setting of CAD. He has no desire to quit at this time.  He remains active and only complains of pain when stressed. We will see him in a year unless he becomes symptomatic.  We will repeat stress test at that  time. His updated medication list for this problem includes:    Aspirin Ec 325 Mg Tbec (Aspirin) .Marland Kitchen... Take one tablet by mouth daily    Nitroglycerin 0.4 Mg Subl (Nitroglycerin) ..... One tablet under tongue every 5 minutes as needed for chest pain---may repeat times three    Carvedilol 3.125 Mg Tabs (Carvedilol) .Marland Kitchen... Take one tablet by mouth twice a day    Plavix 75 Mg Tabs (Clopidogrel bisulfate) .Marland Kitchen... Take 1 tab daily    Lisinopril 10 Mg Tabs (Lisinopril) .Marland Kitchen... Take 1 tablet by mouth once daily  Problem # 2:  HYPERTENSION (ICD-401.9) Moderately controlled.  Will monitor this.  May need to increase coreg to 6.25 two times a day to keep BP less than 140/80 consistantly.   His updated medication list for this problem includes:    Aspirin Ec 325 Mg Tbec (Aspirin) .Marland Kitchen... Take one tablet by mouth daily    Carvedilol 3.125 Mg Tabs (Carvedilol) .Marland Kitchen... Take one tablet by mouth twice a day    Lisinopril 10 Mg Tabs (Lisinopril) .Marland Kitchen... Take 1 tablet by mouth once daily  Problem # 3:  TOBACCO ABUSE (ICD-305.1) He has no desire to quit.  Patient Instructions: 1)  Your physician recommends that you schedule a follow-up appointment in: 1 year 2)  Your physician recommends that you continue on your current medications as directed. Please refer to the Current Medication list given to you today.

## 2010-02-11 NOTE — Assessment & Plan Note (Signed)
Summary: 1 wk RE-CK LT MIDDLE FINGER/MEDICARE/CAF   Visit Type:  Follow-up Referring Provider:  Dr. Carylon Perches Primary Provider:  Dr.Roy Fagen  CC:  recheck finger.  History of Present Illness: I saw Alex Lara in the office today for an initial visit.  He is a 70 years old man with the complaint of:  left middle finger injury.  DOI 01/25/10 cut with a skill saw.  Xrays taken [facility] 01/26/10 left middle finger.  PCP gave on Keflex 500mg  one by mouth three times a day for 10 days,   Tramadol for pain, also takes Clonazepam, Lisinopril, Plavix, ASA, Prozac, Meclizine.  Patient injured his finger with a skill saw he has a laceration at the end of the finger.  Today is 1 week recheck wound.  Doing well.  Still on ATBS.  His finger continues to improve. He has an eschar at the tip. He has decreased sensation from previous injury. He should continue with his dressing changes and protection and come back in 3 weeks       Allergies: 1)  ! * Cdn   Other Orders: Est. Patient Level II (16109)  Patient Instructions: 1)  Please schedule a follow-up appointment as needed. 2)  Change to 3 weeks per  patient request   Orders Added: 1)  Est. Patient Level II [60454]

## 2010-02-11 NOTE — Letter (Signed)
Summary: Referral notes from Dr. Ouida Sills  Referral notes from Dr. Ouida Sills   Imported By: Jacklynn Ganong 02/05/2010 12:57:44  _____________________________________________________________________  External Attachment:    Type:   Image     Comment:   External Document

## 2010-02-11 NOTE — Assessment & Plan Note (Signed)
Summary: NURSE VISIT CHEST PAIN  Nurse Visit   Vital Signs:  Patient profile:   70 year old male Height:      71 inches Weight:      203 pounds O2 Sat:      94 % on Room air Pulse rate:   56 / minute BP sitting:   136 / 79  (left arm)  Vitals Entered By: Teressa Lower RN (January 18, 2010 4:09 PM)  O2 Flow:  Room air   Current Medications (verified): 1)  Clonazepam 0.5 Mg Tabs (Clonazepam) .... Three Times A Day 2)  Chelated Potassium 99 Mg Tabs (Potassium) .... Take 1 Tablet By Mouth Once A Day 3)  Buspar 15 Mg Tabs (Buspirone Hcl) .... Take 1/2 Tab Two Times A Day 4)  Aspirin Ec 325 Mg Tbec (Aspirin) .... Take One Tablet By Mouth Daily 5)  Nitroglycerin 0.4 Mg Subl (Nitroglycerin) .... One Tablet Under Tongue Every 5 Minutes As Needed For Chest Pain---May Repeat Times Three 6)  Simvastatin 40 Mg Tabs (Simvastatin) .... Take One Tablet By Mouth Daily At Bedtime 7)  Carvedilol 3.125 Mg Tabs (Carvedilol) .... Take One Tablet By Mouth Twice A Day 8)  Plavix 75 Mg Tabs (Clopidogrel Bisulfate) .... Take 1 Tab Daily 9)  Lisinopril 10 Mg Tabs (Lisinopril) .... Take 1 Tablet By Mouth Once Daily 10)  Tramadol Hcl 50 Mg Tabs (Tramadol Hcl) .... Take 1 Tab Three Times A Day 11)  Fluoxetine Hcl 40 Mg Caps (Fluoxetine Hcl) .... Take 1 Tab Daily 12)  Fish Oil Double Strength 1200 Mg Caps (Omega-3 Fatty Acids) .... Take 1 Cap Daily 13)  Vitamin E 400 Unit Caps (Vitamin E) .... Take 1 Tab Daily 14)  Antivert 25 Mg Tabs (Meclizine Hcl) .... Take 1 Tab Three Times A Day 15)  Prilosec 20 Mg Cpdr (Omeprazole) .... Take Prn  Allergies (verified): 1)  ! * Cdn  Visit Type:  triage nurse visit for cp Referring Provider:  Dr. Carylon Perches Primary Provider:  Dr.Roy Fagen   History of Present Illness: S: pt c/o cp over the weekend B: ov on 01/07/2010, no changes at that time A: cp with ekg, sinus brady, no other co, took nitro once on Friday, Saturday and Sunday R:   He has a history of stress  induced chest discomfort and continues to smoke. No appointment necessary at present unless chest pain becomes worse or more frequent.  He should go to ER if this occurs.  Joni Reining NP  Left detailed message on pt's phone after 2 message for pt to return call

## 2010-04-19 LAB — CBC
HCT: 41.7 % (ref 39.0–52.0)
Hemoglobin: 14.4 g/dL (ref 13.0–17.0)
Platelets: 139 10*3/uL — ABNORMAL LOW (ref 150–400)
Platelets: 146 10*3/uL — ABNORMAL LOW (ref 150–400)
RDW: 14 % (ref 11.5–15.5)
RDW: 14.4 % (ref 11.5–15.5)
WBC: 6.2 10*3/uL (ref 4.0–10.5)
WBC: 6.7 10*3/uL (ref 4.0–10.5)

## 2010-04-19 LAB — BASIC METABOLIC PANEL
BUN: 24 mg/dL — ABNORMAL HIGH (ref 6–23)
Calcium: 8.5 mg/dL (ref 8.4–10.5)
Chloride: 102 mEq/L (ref 96–112)
GFR calc Af Amer: >60 mL/min (ref >60)
GFR calc non Af Amer: >60 mL/min (ref >60)
Glucose, Bld: 97 mg/dL (ref 70–99)
Potassium: 3.9 mEq/L (ref 3.5–5.1)
Potassium: 4 mEq/L (ref 3.5–5.1)
Sodium: 134 mEq/L — ABNORMAL LOW (ref 135–145)
Sodium: 138 mEq/L (ref 135–145)

## 2010-04-19 LAB — PROTIME-INR: Prothrombin Time: 15.1 seconds (ref 11.6–15.2)

## 2010-04-29 ENCOUNTER — Other Ambulatory Visit: Payer: Self-pay | Admitting: Adult Health

## 2010-04-29 ENCOUNTER — Other Ambulatory Visit: Payer: Self-pay | Admitting: Cardiology

## 2010-05-25 NOTE — H&P (Signed)
NAME:  Alex Lara, Alex Lara                ACCOUNT NO.:  [id number]   MEDICAL RECORD NO.:  [id number]          PATIENT TYPE:  INP   LOCATION:  A314                          FACILITY:  [facility]   PHYSICIAN:  Edward L. Juanetta Gosling, M.D.DATE OF BIRTH:  1940/05/04   DATE OF ADMISSION:  08/08/2007  DATE OF DISCHARGE:  [facility]                              HISTORY & PHYSICAL   REASON FOR ADMISSION:  Elevated prothrombin time.   HISTORY:  Alex Lara is a 70 year old who has been on Coumadin for some  time and who went to get his protime checked because he was having a  little bit of a nosebleed, and after he lost his teeth, he had some  bleeding around his gums and he was noted to have a PT that was greater  than 90 and INR of greater than 9.  He was advised to come to the  hospital at that point.   PAST MEDICAL HISTORY:  His past medical history is positive for DVT,  which is a complication of a leg fracture.  He had been on Coumadin he  says for about 3 or 4 years.  He has not had any changes in medication.  He has not had any other changes.  He has not had any changes in his  Coumadin dose.  His Coumadin dose has been stable for months and he has  been feeling okay.  His past medical history is positive for anxiety and  depression.  Surgically, he has had a hernia repair and rotator cuff  repair.   SOCIAL HISTORY:  He does not use any alcohol.  He works as doing odd  jobs.  He has a previous history of smoking, but he does not use any  alcohol.  He does not use any illicit drugs.  He is married, lives at  home with his family.   FAMILY HISTORY:  Negative for any sort of clotting problems as far as he  knows.   CURRENT MEDICATIONS:  1. Klonopin 0.5 p.o. 4 times a day on a p.r.n. basis.  2. Ibuprofen 400 mg b.i.d. p.r.n.  3. BuSpar 15 mg one-half tablet p.o. b.i.d.  4. Prozac 40 mg daily.  5. Coumadin, he takes a total of 8 mg daily.  He says when it was      checked about a month ago, it was okay.   ALLERGIES:  He is allergic to VIOXX and CODEINE.   PHYSICAL EXAMINATION:  GENERAL:  Well-developed, well-nourished male who  is in no acute distress.  VITALS SIGNS:  Temperature is 97.8, pulse 67, respirations 18, blood  pressure 126/95, O2 sats 99% on room air, height 73 inches, and weight  93.89 kg.  HEENT:  Pupils are reactive.  Nose and throat are clear.  NECK:  Supple without masses.  He does not have any jugular venous  distention.  CHEST:  Clear without wheezes, rales, or rhonchi.  HEART:  Regular without murmur, gallop, or rub.  ABDOMEN:  Soft.  He has not noticed any blood in his stool.  I did not  do a rectal  exam now.  He is going to have stools collected for blood.   ASSESSMENT:  He has a markedly elevated prothrombin time, which is of  unknown cause.  He has not changed any medications.  He has not had any  other changes.  He has not changed the dose.   PLAN:  My plan then is for him to go and get vitamin K.  Since he is not  actively bleeding right now, I am not going to give him any fresh frozen  plasma, but I will if he develops any active bleeding.  He is going to  be on a regular diet and we will recheck his labs in the morning.  His  CBC is pending as is his __________.  If he is anemic, then I probably  will go ahead and transfuse him, and at that point, probably give some  fresh frozen plasma.      Edward L. Juanetta Gosling, M.D.  Electronically Signed     ELH/MEDQ  D:  08/08/2007  T:  08/09/2007  Job:  [id number]

## 2010-05-25 NOTE — Assessment & Plan Note (Signed)
[facility]                       [place] CARDIOLOGY OFFICE NOTE   KINGSTON, SHAWGO                       MRN:          [id number]  DATE:06/25/2008                            DOB:          Feb 01, 1940    CARDIOLOGIST:  Gerrit Friends. Dietrich Pates, MD, [facility]   PRIMARY CARE PHYSICIAN:  Kingsley Callander. Ouida Sills, MD.   REASON FOR VISIT:  Postcatheterization follow-up.   HISTORY OF PRESENT ILLNESS:  Alex Lara is a 70 year old male patient who  recently was evaluated by Gerrit Friends. Dietrich Pates, MD, [facility] for chest  discomfort.  He was set up for cardiac catheterization.  This revealed  two-vessel CAD with an 80% mid LAD stenosis and a totally occluded mid  RCA with left-to-right collaterals.  He also had evidence of ischemic  cardiomyopathy with an EF of 35% and hypokinesis of the inferobasal  wall.  He was brought back for percutaneous intervention by Verne Carrow, MD on June 12, 2008.  He received two overlapping Xience drug-  eluting stents to the proximal and mid LAD.  It was noted that he would  need to remain on Plavix and aspirin for at least a year.  ACE inhibitor  therapy was also initiated.  The patient had some baseline bradycardia  and had been placed on Toprol recently, but seemed to be tolerating it.  He returns to office today for follow-up.  He notes that he is  dramatically better.  He denies any significant chest discomfort or  shortness of breath with exertion.  He has had a couple of atypical  chest pains that have lasted just briefly.  He denies orthopnea, PND or  pedal edema.  He denies any pain at his groin site.  He denies syncope  or near-syncope.   CURRENT MEDICATIONS:  The patient did not bring his medicines with him  today.  We believe that he is taking:  Clonazepam 0.5 mg four times a day p.r.n., Warfarin as directed, Fish  oil 1000 mg daily, Aspirin 81 mg daily, Fluoxetine 40 mg daily, Plavix  75 mg daily,  Toprol XL 25 mg daily,  Simvastatin 40 mg daily, Lisinopril 10 mg daily,  BuSpar 15 mg b.i.d., Potassium 8 mEq daily, Tramadol p.r.n., Advil  p.r.n., Nitroglycerin p.r.n.   PHYSICAL EXAMINATION:  He is a well-nourished, well-developed male in no  acute distress.  Blood pressure is 116/75, pulse 53, weight 197 pounds.  HEENT:  Normal.  NECK:  Without JVD.  LYMPH:  Without lymphadenopathy.  CARDIAC:  Normal S1 and S2.  Regular rate and rhythm.  LUNGS:  Clear to auscultation bilaterally.  ABDOMEN:  Soft and nontender.  EXTREMITIES:  Without edema.  NEUROLOGIC:  He is alert and oriented x3.  Cranial nerves II-XII are  grossly intact.  SKIN:  Warm and dry.  VASCULAR EXAM:  Right femoral arteriotomy site without hematoma or  bruit.   Electrocardiogram reveals sinus bradycardia with a heart rate of 51,  leftward axis, inferior Q-waves with T-wave inversions, PACs,  interventricular conduction delay.  No significant change since previous  tracing.   ASSESSMENT/PLAN:  1. Coronary artery disease.  As noted above, the patient has a totally      occluded right coronary artery with left-to-right collaterals.  He      is status post drug-eluting stent placement x2 to the left anterior      descending artery.  He is now on Plavix and full-dose aspirin.  I      discussed this case further with Gerrit Friends. Dietrich Pates, MD, [facility] and      we have recommended that he discontinue Coumadin secondary to      increased risks of bleeding.  The patient had a deep vein      thrombosis post leg fracture 6 years ago.  He has not had a      recurrence of deep vein thrombosis since that time.  The patient      will remain on Plavix and aspirin for at least a year.  In the      event that he comes off of Plavix we can certainly consider whether      or not Coumadin should be reinitiated at that time.  We will be in      touch with Kingsley Callander. Ouida Sills, MD to inform him of this medication      change.  2. Ischemic cardiomyopathy with an ejection  fraction of 35%.  This      will likely recover post percutaneous coronary intervention.      However, the patient will be treated medically at this time.  His      Toprol will be transitioned over her carvedilol 3.125 mg b.i.d.  I      will bring him back in a couple of weeks after the change to      reassess his heart rate and blood pressure.  Otherwise, he seems to      be on a good medical regimen with angiotension-converting enzyme      inhibitor therapy.  He will likely require follow up      echocardiography in the next 2-3 months to reassess his left      ventricular function.  If there is no recovery he may be a      candidate for prophylactic implantable cardioverter-defibrillator      implantation.  3. Hypertension.  This is very well-controlled.  He will continue his      current medications as outlined above with medication changes as      outlined.  4. Dyslipidemia.  He will follow-up lipids and LFTs in 3 months.  5. Chronic obstructive pulmonary disease with ongoing tobacco abuse.      He has been advised to discontinue smoking.  He is trying to quit.   DISPOSITION:  He will be brought back in follow up with Gerrit Friends.  Dietrich Pates, MD, [facility] in the next 3 months or sooner p.r.n.     Tereso Newcomer, PA-C  Electronically Signed      Gerrit Friends. Dietrich Pates, MD, [facility]  Electronically Signed   SW/MedQ  DD: 06/25/2008  DT: 06/25/2008  Job #: [id number]   cc:   Kingsley Callander. Ouida Sills, MD

## 2010-05-25 NOTE — Cardiovascular Report (Signed)
NAME:  Alex Lara, Alex Lara NO.:  [id number]   MEDICAL RECORD NO.:  [id number]          PATIENT TYPE:  INP   LOCATION:  2501                         FACILITY:  [facility]   PHYSICIAN:  Verne Carrow, MDDATE OF BIRTH:  [birth year]   DATE OF PROCEDURE:  06/12/2008  DATE OF DISCHARGE:                            CARDIAC CATHETERIZATION   PRIMARY CARDIOLOGIST:  Gerrit Friends. Dietrich Pates, MD, [facility]   PRIMARY CARE PHYSICIAN:  Kingsley Callander. Ouida Sills, MD   PROCEDURES PERFORMED:  Percutaneous coronary intervention with placement  of 2 overlapping Xience drug-eluting stent in the proximal and mid left  anterior descending coronary artery.   OPERATOR:  Verne Carrow, MD   INDICATIONS:  This is a 70 year old patient with a history of  longstanding tobacco abuse, hypertension, hyperlipidemia and a strong  family history of coronary artery disease who has had recent complaints  of chest discomfort.  He was brought into the outpatient cardiac  catheterization laboratory on June 10, 2008, and had a diagnostic left  heart catheterization.  This showed a total obstruction in the right  coronary artery with left-to-right collaterals.  It also showed high-  grade stenosis in the proximal and mid left anterior descending coronary  artery.  The patient was brought back into our inpatient cardiac  catheterization laboratory today for planned percutaneous coronary  intervention.   PROCEDURE IN DETAIL:  The patient was brought to the main cardiac  catheterization laboratory after signing informed consent for procedure.  The right groin was prepped and draped in sterile fashion.  Lidocaine 1%  was used for local anesthesia.  A 6-French sheath was inserted into the  right femoral artery without difficulty.  Once the sheath was  successfully entered, the patient was given a bolus of Angiomax and a  drip started.  Of note, the patient had been loaded with 300 mg of  Plavix 2 days ago.  An XB LAD 4.0  guiding catheter was used to  selectively engage the left main coronary artery.  Once the ACT was  greater than 200 a cougar Intracoronary wire was passed down into the  distal left anterior descending coronary artery without difficulty.  I  started by performing inflations with an 2.5- x 20-mm balloon extending  from the mid LAD back to the proximal segment.  This showed that the  stenosis yielded well to balloon angioplasty.  I then placed a 2.5- x 15-  mm Xience drug-eluting stent in the mid left anterior descending.  There  was a considerable size discrepancy of the left anterior descending in  the midportion as compared to the proximal portion.  The vessel tapered  in caliber after the takeoff of a large diagonal branch.  Next, deployed  a 3.0- x 23-mm.  I excise ENT:  A drug-eluting stent in overlapping  fashion with first stent in the proximal LAD.  This stent balloon was  removed and a 2.75 x 12-mm noncompliant balloon was passed down to the  distal stent and placed in the mid LAD.  This was inflated to 10  atmospheres.  This gave [place] a diameter of  2.77 mm.  I then passed a 3.25  x 12-mm noncompliant balloon down into the stented segment.  The overlap  of the stents was dilated with this balloon.  This balloon was pulled  back to the proximal edge of the proximal stent and inflated again.  This was taken up to 16 atmospheres which gave [place] a diameter of 3.36 mm.  There was an excellent angiographic result.  The stenosis.  The stenosis  taken from 80% down to 0%.  There was TIMI III flow down the LAD at the  end of the procedure.  Of note, there was slight plaque shift into the  ostium of the diagonal branch.  However, there was good flow down this  vessel at the end of the procedure.  The patient tolerated procedure  well and taken to the holding area in stable condition.   IMPRESSION:  1. Successful percutaneous coronary intervention with placement of two      overlapping signs  Xience drug-eluting stents in the proximal and      mid left anterior descending coronary artery.   RECOMMENDATIONS:  The patient should be continued on aspirin, Plavix for  at least a year.  He does have a history of bradycardia, but has been  tolerating low-dose beta-blocker at this time.  We will plan on  continuing his beta-blocker and statin therapy.  I will start him on an  ACE inhibitor secondary to his elevated blood pressure and his mild-to-  moderate ischemic cardiomyopathy.  The patient be monitored closely  today and then we will discharged to home tomorrow.      Verne Carrow, MD  Electronically Signed     CM/MEDQ  D:  06/12/2008  T:  06/12/2008  Job:  [id number]

## 2010-05-25 NOTE — Cardiovascular Report (Signed)
NAME:  Alex Lara, Alex Lara NO.:  [id number]   MEDICAL RECORD NO.:  [id number]          PATIENT TYPE:  OIB   LOCATION:  1961                         FACILITY:  [facility]   PHYSICIAN:  Verne Carrow, MDDATE OF BIRTH:  08-21-1940   DATE OF PROCEDURE:  06/10/2008  DATE OF DISCHARGE:  06/10/2008                            CARDIAC CATHETERIZATION   PRIMARY CARDIOLOGIST:  Gerrit Friends. Dietrich Pates, MD, [facility]   PRIMARY CARE PHYSICIAN:  Kingsley Callander. Ouida Sills, MD   PROCEDURES PERFORMED:  1. Left heart catheterization.  2. Selective coronary angiography.  3. Left ventricular angiogram.   OPERATOR:  Verne Carrow, MD   INDICATIONS:  Chest pain in a 70 year old Caucasian male with a history  of longstanding tobacco abuse, hypertension, hyperlipidemia, and a  strong family history of coronary artery disease.   PROCEDURE IN DETAIL:  The patient was brought into the outpatient  cardiac catheterization laboratory after signing informed consent for  the procedure.  The right groin was prepped and draped in a sterile  fashion.  Lidocaine 1% was used for local anesthesia.  A 4-French sheath  was inserted into the right femoral artery without difficulty.  A JL-5  diagnostic catheter was used to selectively engage the left coronary  system.  A 3DRC diagnostic catheter was used to selectively engage and  inject the right coronary artery.  A 4-French pigtail catheter was used  to perform the left ventricular angiogram.  The patient tolerated the  procedure well.   HEMODYNAMIC FINDINGS:  Central aortic pressure 149/75.  Left ventricular  pressure 151/19.  Left ventricular end-diastolic pressure 29.   ANGIOGRAPHIC FINDINGS:  1. The left main coronary artery had no evidence of disease.  2. The left anterior descending coronary artery is a large vessel that      courses to the apex and gives off a large size diagonal branch of      the midportion.  There are several moderate-to-large sized  septal      perforator branches.  There is a segment in the midportion of the      vessel that has serial 80% lesions.  The first diagonal branch has      plaque disease only.  The distal LAD has minimal plaque disease of      20%.  3. Circumflex coronary artery gives off two marginal branches.  The      first marginal branch is a moderate-to-large size branch, has      minimal plaque disease only.  There is no significant disease noted      in this system.  4. The right coronary artery is a dominant vessel that has a 30%      proximal occlusion and a 100% mid occlusion.  The distal right      coronary artery posterior descending artery and posterolateral      branch fills from left-to-right collaterals.  5. Left ventricular angiogram was performed in the RAO projection and      shows segmental left ventricular dysfunction with inferobasilar      hypokinesis.  Overall ejection fraction is estimated at 35%.  IMPRESSION:  1. Double-vessel coronary artery disease.  2. Moderate segmental left ventricular dysfunction.   RECOMMENDATIONS:  The patient will be loaded with 300 mg of Plavix  today.  We will then start aspirin 325 mg once daily and Plavix 75 mg  once daily.  We will continue to hold his Coumadin and we will continue  his beta-blocker.  We will plan on performing percutaneous coronary  intervention of the mid left anterior descending coronary artery on  Friday, June 13, 2008, in the main cath lab.  The patient will be  discharged home later today if he is clinically stable after his 2 hours  of bedrest.      Verne Carrow, MD  Electronically Signed     CM/MEDQ  D:  06/10/2008  T:  06/10/2008  Job:  [id number]   cc:   Gerrit Friends. Dietrich Pates, MD, [facility]  Kingsley Callander. Ouida Sills, MD

## 2010-05-25 NOTE — Discharge Summary (Signed)
NAME:  Alex Lara, SASSO NO.:  [id number]   MEDICAL RECORD NO.:  [id number]          PATIENT TYPE:  INP   LOCATION:  2501                         FACILITY:  [facility]   PHYSICIAN:  Verne Carrow, MDDATE OF BIRTH:  November 15, 1940   DATE OF ADMISSION:  06/12/2008  DATE OF DISCHARGE:  06/13/2008                               DISCHARGE SUMMARY   PRIMARY CARE PHYSICIAN:  Kingsley Callander. Ouida Sills, MD   CARDIOLOGIST:  Verne Carrow, MD   DISCHARGING DIAGNOSES:  1. Coronary artery disease, status post cardiac catheterization with      successful percutaneous coronary intervention with placement of two      overlapping XIENCE drug-eluting stents in the proximal and mid left      anterior descending artery.  2. Ongoing tobacco abuse.  3. Hypertension.  4. Dyslipidemia.  5. History of deep venous thromboses, requiring chronic      anticoagulation therapy.  6. History of depression and anxiety.   HOSPITAL COURSE:  Alex Lara is a 70 year old Caucasian gentleman who  recently evaluated by Dr. Dietrich Pates at the [facility] office for  complaints of chest discomfort.  The patient was referred to [facility]  for further evaluation and underwent cardiac catheterization, outpatient  on June 10, 2008.  The patient was found to have double-vessel coronary  artery disease with moderate segmental left ventricular dysfunction with  an ejection fraction of 35%.  The patient was loaded with 300 mg of  Plavix and then started on aspirin and Plavix with plans to return for  intervention, which the patient did not show on June 12, 2008.  Results  as stated above.  The patient tolerated procedure without complications.  Dr. Clifton James has been examined and assessed the patient on day of  discharge.  Vital signs stable.  Potassium 3.9, creatinine 0.8.  Hematocrit 41.7, platelets 139,000.  The patient is being discharged  home after cardiac rehabilitation education has been provided.  He has  been given  the post PCI instructions.  He will follow up with Dr.  Clifton James in 2-3 weeks.  I will arrange this prior to him leaving today.  He will follow up with [facility] for PT/INR on Tuesday.  He will  also have a BMET drawn at that time, I have given him a prescription for  that lab work.  Dr.  Ouida Sills manages the patient's Coumadin level.  The  chemistry will be faxed to Dr. Gibson Ramp office.   MEDICATIONS AT TIME OF DISCHARGE:  1. Aspirin 81.  2. Plavix 75.  3. Toprol-XL 25.  4. Simvastatin 40.  5. Lisinopril 10.  6. Warfarin, the patient previously took 6 mg a day, will continue      this dose at time of discharge.  His INR was 1.2 yesterday.  7. Clonazepam as previously taken.  8. Fish oil 1000 mg daily.  9. BuSpar 50 mg b.i.d.  10.Fluoxetine 40 mg daily.  11.KCl 8 mEq daily.  12.Nitroglycerin as needed.  The patient was instructed to stop the      Prilosec.   DURATION OF DISCHARGE ENCOUNTERED:  Over  30 minutes.      Dorian Pod, ACNP      Verne Carrow, MD  Electronically Signed    MB/MEDQ  D:  06/13/2008  T:  06/13/2008  Job:  [id number]

## 2010-05-25 NOTE — Letter (Signed)
Jun 03, 2008    Kingsley Callander. Ouida Sills, MD  [address]  [place], [place] [address]   RE:  Alex Lara, Alex Lara  MRN:  [id number]  /  DOB:  [birth year]   Dear Channing Mutters:   It was my pleasure evaluating Mr. Dartanyan Deasis in the office today in  consultation at your request for chest discomfort.  This gentleman has  significant cardiovascular risk factors, most notably 50 pack-year  history of cigarette smoking that continues at one pack per day and  moderate hyperlipidemia with total cholesterol of approximately 270 and  LDL of 170.  He was treated briefly for hyperlipidemia in the past, but  discontinued that medication.  He has had borderline hypertension, but  has refused to take medication for that problem.  He has a strong family  history for coronary disease.   Over the past 2 months, he has noted decreased exercise tolerance in his  work in [employer].  With moderate exercise, he notes chest discomfort  that is mild to moderate and somewhat oppressive and difficult to  characterize, associated with dyspnea, which is the predominant symptom.  With a few minutes of rest, the symptoms resolve.  Once he resumes  activity, he often does not experience any additional chest discomfort  or dyspnea, even if he increases his level of activity.  He notes no  nausea nor diaphoresis.  There has been no progression in the frequency  or severity of these symptoms.   PAST MEDICAL HISTORY:  Otherwise notable for an ORIF for a left leg  fracture in 2004.  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  He has mild  residual edema in the left leg without discomfort or skin breakdown.   CURRENT MEDICATIONS:  1. Clonazepam 0.5 mg b.i.d. p.r.n.  2. Warfarin as directed.  3. Fish oil 1000 mg daily.  4. KCl 8 mEq daily.  5. Omeprazole 20 mg daily.  6. Aspirin 81 mg daily.  7. Buspirone 15 mg b.i.d.  8. Fluoxetine 40 mg daily.  9. Tramadol and Advil  p.r.n.   ALLERGIES:  Mr. Grant has had no true drug allergies.  He experiences  nausea with CODEINE.   SOCIAL HISTORY:  Self employed in a Dealer; married with  3 adult children; no excessive use of alcohol.   FAMILY HISTORY:  Father died at age 48 due to myocardial infarction.  Mother underwent CABG surgery late in life and subsequently died due to  cardiac disease.   REVIEW OF SYSTEMS:  Notable for need for corrective lenses for reading,  some hearing loss, upper dentures, occasional palpitations, a history of  murmur as a child, history of peptic ulcer disease at age 23, a remote  history of jaundice, GERD.  All other systems reviewed and are negative.   EXAM:  CONSTITUTIONAL:  Very pleasant, talkative gentleman in no acute  distress.  VITAL SIGNS:  The weight is 203, blood pressure 155/100, heart rate 65  and regular, respirations 14 and unlabored.  Afebrile.  HEENT:  Anicteric sclerae; normal lids and conjunctivae; normal oral  mucosa.  NECK:  No jugular venous distention; normal carotid upstrokes without  bruits.  ENDOCRINE:  No thyromegaly.  HEMATOPOIETIC:  No adenopathy.  SKIN:  No significant lesions.  PSYCHIATRIC:  Alert and oriented; normal affect.  LUNGS:  Increased AP diameter; increased expiratory phase without  wheezing or rhonchi.  CARDIAC:  Normal first and second heart sounds; no  murmur nor gallop  appreciated.  ABDOMEN:  Soft and nontender; normal bowel sounds; no organomegaly.  EXTREMITIES:  Distal pulses intact; no edema.  NEUROMUSCULAR:  Symmetric strength and tone; normal cranial nerves.   LABORATORY:  From August 2009 revealed a normal urine analysis, a normal  CBC and a normal chemistry profile.  Total cholesterol was 271 with  triglycerides of 321, HDL 31 and LDL of 176.   EKG:  Normal sinus rhythm; left axis deviation; right ventricular  conduction delay; slightly delayed R-wave progression.   IMPRESSION:  Mr. Grundman has symptoms  that are highly suggestive of  exertional angina.  The only non-classic aspect of his description is  the resolution with a single episode and subsequent ability to exercise  to a higher degree of activity.  This pattern is suggestive of walks  through angina.  After 2 months of symptoms, it is unlikely they will  resolve spontaneously.  Accordingly, a stress test will probably not be  of much value with a probability of disease of at least 50%.  The risks  and benefits of cardiac catheterization were discussed including death,  myocardial infarction, CVA and renal failure.  The patient agrees to  proceed.  An outpatient procedure will be set up.  He will hold  Coumadin.  Basic labs and his chest x-ray are pending.  I will reassess  this nice gentleman after coronary are angiography has been completed.   He certainly requires improved control of risk factors.  Metoprolol will  be started for borderline hypertension and for coronary disease.  Due to  his resting bradycardia, he may not tolerate much of this drug.  Starring dose will be 25 mg.  Aspirin will also be started as well  simvastatin 40 mg daily.  He was given a prescription for sublingual  nitroglycerin and told to use these for any prolonged episodes and come  to the emergency department should one occur.  He was encouraged to  reconsider his previous decision not to stop smoking.    Sincerely,      Gerrit Friends. Dietrich Pates, MD, [facility]  Electronically Signed    RMR/MedQ  DD: 06/03/2008  DT: 06/03/2008  Job #: [id number]

## 2010-05-25 NOTE — Discharge Summary (Signed)
NAME:  Alex Lara, Alex Lara                ACCOUNT NO.:  [id number]   MEDICAL RECORD NO.:  [id number]          PATIENT TYPE:  INP   LOCATION:  A314                          FACILITY:  [facility]   PHYSICIAN:  Kingsley Callander. Ouida Sills, MD       DATE OF BIRTH:  Nov 06, 1940   DATE OF ADMISSION:  08/08/2007  DATE OF DISCHARGE:  07/30/2009LH                               DISCHARGE SUMMARY   DISCHARGE DIAGNOSES:  1. Epistaxis.  2. Supratherapeutic INR  3. Depression/anxiety.  4. History of deep venous thrombosis.   DISCHARGE MEDICATIONS:  1. BuSpar 7.5 mg b.i.d.  2. Klonopin 0.5 mg q.i.d. p.r.n.  3. Fluoxetine 40 mg daily.   HOSPITAL COURSE:  This patient is a 70 year old male who presented with  epistaxis and bleeding from several superficial skin areas.  He was  anticoagulated with Coumadin.  His Coumadin dose had been stable since  February.  His hemoglobin was 14.7.  His INR was greater than 9.8.  Coumadin was stopped.  He was treated with vitamin K twice.  His INR  dropped to 7.4 on the morning of August 09, 2007.  His bleeding stopped.  He was hemodynamically stable and was felt to be stable for discharge on  the evening of August 09, 2007.  He will have another INR tomorrow morning  and we will hold Coumadin and we will avoid any anti-inflammatories  including aspirin, ibuprofen, or naproxen.   Additional modifications in his Coumadin therapy will be made as an  outpatient.      Kingsley Callander. Ouida Sills, MD  Electronically Signed     ROF/MEDQ  D:  08/09/2007  T:  08/10/2007  Job:  [id number]

## 2010-05-28 NOTE — Op Note (Signed)
[place]. [facility]  Patient:    Alex Lara, Alex Lara Visit Number: [id number] MRN: [id number]          Service Type: DSU Location: [facility] Attending Physician:  Ronne Binning Dictated by:   Nicki Reaper, M.D. Proc. Date: 10/25/00 Admit Date:  10/25/2000 Discharge Date: 10/26/2000                             Operative Report  PREOPERATIVE DIAGNOSIS:  Status post cat bite, incision and drainage felon with extension and probable flexor sheath infection, left thumb.  POSTOPERATIVE DIAGNOSIS:  Status post cat bite, incision and drainage felon with extension and probable flexor sheath infection, left thumb.  OPERATION PERFORMED:  Incision and drainage radial bursa, left thumb, placement of irrigation catheters.  SURGEON:  Nicki Reaper, M.D.  ANESTHESIA:  General.  ANESTHESIOLOGIST:  Dr. Katrinka Blazing.  INDICATIONS FOR PROCEDURE:  The patient is a 70 year old male who suffered a cat bite to the pulp of his thumb.  He has undergone incision and drainage; however, has had progressive pain and swelling.  He is now seen with swelling in the flexor sheath and forearm which has increased over the past 24 hours.  DESCRIPTION OF PROCEDURE:  The patient was brought to the operating room where a general anesthetic was carried out without difficulty.  He was prepped and draped using Betadine scrub and solution with the left arm free.  The limb was elevated for exsanguination.  Tourniquet was inflated to 250 mmHg after exsanguination of the forearm proximal to the wrist and above the elbow.  The prior area of drainage was opened.  Purulent material was immediately encountered.  A second incision was then made along the flexor sheath, metacarpophalangeal joint and purulence was encountered at the flexor sheath. This area was opened.  A third incision was made on the distal forearm, just radial to the palmaris longus.  Dissection carried down radial to the medial nerve and  flexor tendons ulnar to flexor carpi radialis protecting the palmar cutaneous branch of the median nerve.  The FPL tendon sheath was opened proximally and purulence was again noted.  Cultures were taken from both aerobic cultures.  A #5 infant feeding tube was then threaded proximally to distally to the midportion of the forearm.  The sheath was then copiously irrigated with bacitracin containing solution from proximal to distal with good flow through the metacarpophalangeal area opening.  An IV catheter was then placed in the distal wound and the sheath irrigated from a distal to proximal direction, again exiting at the metacarpophalangeal joint incision. After copiously irrigating the distal component, the IV catheter was placed into the metacarpophalangeal joint incision.  The sheath was then irrigated until easy flow was obtained through the catheter from a proximal to a distal direction.  A vessel loop was placed into the distal wound.  A sterile compressive dressing and splint was applied.  The patient tolerated the procedure well and was taken to the recovery room for observation in satisfactory condition.  He will be admitted for overnight stay for pain control, irrigation and IV antibiotics. Dictated by:   Nicki Reaper, M.D. Attending Physician:  Ronne Binning DD:  10/25/00 TD:  10/26/00 Job: [id number]

## 2010-05-28 NOTE — Consult Note (Signed)
[place]. [facility]  Patient:    Alex Lara, Alex Lara Visit Number: [id number] MRN: [id number]          Service Type: SUR Location: 5000 [id number] Attending Physician:  Marlowe Shores Dictated by:   Artist Pais Mina Marble, M.D. Proc. Date: 10/29/00 Admit Date:  10/29/2000                            Consultation Report  REASON FOR CONSULTATION:  Left hand pain and swelling status post cat bite, status post I&D this past Wednesday on his left hand, wrist, and thumb.  HISTORY OF PRESENT ILLNESS:  Mr. Diop is a 70 year old right-hand dominant male who was bitten by a cat approximately 10-15 days ago.  Has been treated by my partner, Dr. Cindee Salt, with an I&D this past Wednesday, the 16th, involving the thumb and forearm who presents today with a 12-24 hour history of increasing pain and swelling despite intravenous Unasyn therapy.  He is an otherwise healthy 70 year old male right-hand dominant.  ALLERGIES:  CODEINE.  CURRENT MEDICATIONS:  None.  PAST MEDICAL HISTORY:  None.  PAST SURGICAL HISTORY:  Shoulder surgery in the past two years.  FAMILY HISTORY:  Noncontributory.  SOCIAL HISTORY:  Noncontributory.  PHYSICAL EXAMINATION  GENERAL:  Well-nourished male, pleasant.  Alert and oriented x 3.  EXTREMITIES:  Hand and wrist on the left:  He has significant swelling on the forearms and antecubital fossa distally.  He has no streaking up the arm, no significant adenopathy, but significant swelling.  He has a 2.5 cm incision in the midline over the palmaris longus area with purulent material in it.  He also has a fair amount of ______ formed swelling in the hand and wrist area and the thumb.  He has pain over the mid palmar space, pain in the thenar space, and subjective numbness and tingling in his fingers.  LABORATORIES:  X-rays in the emergency department show some questionable soft tissue gas in the thumb soft tissues as well as possible  foreign body consistent with possible cat tooth remnant.  IMPRESSION:  A 70 year old male status post cat bite with persistent pain and swelling despite one incision and drainage and intravenous antibiotics.  At this point in time recommendations would be to take him to the operating room for more extensive irrigation and debridement, possible flexor sheath I&D with indwelling catheters.  We also contacted Dr. Ninetta Lights at the infectious disease service to see if there is any other antibiotic therapy that needs to be undertaken.  We will get him admitted to the hospital. Dictated by:   Artist Pais. Mina Marble, M.D. Attending Physician:  Marlowe Shores DD:  10/29/00 TD:  10/30/00 Job: [id number]

## 2010-05-28 NOTE — Op Note (Signed)
[place]. [facility]  Patient:    Alex Lara, Alex Lara Visit Number: [id number] MRN: [id number]          Service Type: Attending:  Nicki Reaper, M.D. Dictated by:   Nicki Reaper, M.D. Proc. Date: 01/04/01                             Operative Report  PREOPERATIVE DIAGNOSIS:  Status post cat bite with infection left arm.  POSTOPERATIVE DIAGNOSIS:  Status post cat bite with infection left arm.  OPERATION:  Incision and drainage left forearm.  SURGEON:  Nicki Reaper, M.D.  ASSISTANT:  Neita Carp, R.N.  ANESTHESIA:  Axillary block.  ANESTHESIOLOGIST:  Maren Beach, M.D.  INDICATIONS:  The patient is a 70 year old male who has had an infection secondary to a cat bite.  He has undergone multiple incision and drainages for this including his entire forearm carpal tunnel.  He is admitted now with a reformation of an abscess on the distal forearm.  DESCRIPTION OF PROCEDURE:  The patient was brought to the operating room where an axillary block was carried out without difficulty.  He was prepped and draped using Betadine scrub and solution with the left arm free.  The arm was elevated for exsanguination and the tourniquet placed high on the arm.  The tourniquet was inflated to 250 mmHg.  The pointing area was opened.  Purulent material was immediately encountered.  This was found to be down to the flexor to the ring finger.  The abscess cavity was followed up to the drainage area in the palm which had sealed.  An irrigation catheter was placed, and the area debrided, and then copiously irrigated with Bacitracin containing saline solution.  The irrigation catheter was left in place.  The proximal wound was packed open which had been significantly opened.  A sterile compressive dressing and splint were applied.  The patient tolerated the procedure well. DISPOSITION:  He is discharged on Augmentin, and Talwin NX.  He will return on Monday for whirlpool and  repacking. Dictated by:   Nicki Reaper, M.D. Attending:  Nicki Reaper, M.D. DD:  01/04/01 TD:  01/04/01 Job: [id number]

## 2010-05-28 NOTE — Consult Note (Signed)
NAME:  Alex Lara, Alex Lara                          ACCOUNT NO.:  [id number]   MEDICAL RECORD NO.:  [id number]                   PATIENT TYPE:  EMS   LOCATION:  ED                                   FACILITY:  [facility]   PHYSICIAN:  Gracelyn Nurse, M.D.              DATE OF BIRTH:  1940/05/04   DATE OF CONSULTATION:  DATE OF DISCHARGE:  09/07/2001                                   CONSULTATION   REASON FOR CONSULTATION:  Chest pain.   HISTORY OF PRESENT ILLNESS:  This is a 70 year old white male who presents  with left-sided chest pain.  It started last night.  The pain lasted a few  minutes.  The pain is provoked by lying down and relieved by sitting up.  The pain is under the left rib cage but does not radiate.  He has no  shortness of breath.  No nausea or vomiting.   PAST MEDICAL HISTORY:  Hyperlipidemia, tobacco abuse, depression, and right  shoulder surgery.  Also a normal stress test five years ago.   ALLERGIES:  1. CODEINE.  2. VIOXX.   MEDICATIONS:  Lorazepam p.r.n. and Effexor 37.5 mg.  Both medications were  stopped by the patient.   SOCIAL HISTORY:  Tobacco--smokes a pack of cigarettes a day.  Drinks no  alcohol.  He is married, three children.  He works as a Music therapist.   FAMILY HISTORY:  Mother 60 had a CABG.  Father 31 died of MI.   REVIEW OF SYSTEMS:  As per HPI.  He has depression.  All other systems are  reviewed and are normal.   PHYSICAL EXAMINATION:  VITAL SIGNS:  Temperature 99.3 degrees, pulse 64,  respirations 24, blood pressure 107/76.  GENERAL:  This is a well-nourished white male in no acute distress.  HEENT:  Pupils are equal, round, and reactive to light.  Equal ocular  movement intact.  Oral mucosa is moist.  CARDIOVASCULAR:  Regular rate and rhythm.  No murmurs.  LUNGS:  Clear to auscultation.  ABDOMEN:  Soft, nontender, nondistended.  Bowel sounds are positive.  EXTREMITIES:  No edema.  NEUROLOGIC:  Cranial nerves II-XII grossly intact.  SKIN:   Well tanned and moist.  No rash.   LABORATORY DATA:  EKG shows sinus bradycardia with a left anterior  fascicular block.  Compared to EKG on March 05, 1998, it is unchanged.  CT of the chest shows no pericardial effusion and no PE.   Labs:  Sodium is 135, potassium 4.0, chloride 106, CO2 24, BUN 17,  creatinine 0.9, glucose 101, CK 59, MB 2.4, troponin-I 0.01.  White blood  cell 7.4, hemoglobin 16.8, platelets 255.   ASSESSMENT AND PLAN:  Atypical chest pain.  Cardiac enzymes are negative.  EKG is unchanged from previous.  CT scan is negative for pleural effusion  and pericardial effusion.  The patient had a stress test five years ago  which was normal.  The chest pain is very atypical with being provoked by  lying down and relieved by sitting up.  I feel this is noncardiac in nature;  however, feel like he does need to have some stress testing at sometime in  the future.  Discussed this with the patient.  He feels comfortable with  going home.  He has a followup appointment with Dr. Ouida Sills on Tuesday.  I  discussed about following up with him and possibly scheduling for a stress  test as an outpatient.  Also, I warned the patient that if he has any other  symptoms,  return of chest pain, shortness of breath, radiation of chest  pain, any of these symptoms to return to the emergency room as soon as  possible.                                                Gracelyn Nurse, M.D.    JDJ/MEDQ  D:  09/07/2001  T:  09/08/2001  Job:  [id number]   cc:   Kingsley Callander. Ouida Sills, M.D.   Janetta Hora. Hulan Saas, M.D.

## 2010-05-28 NOTE — Op Note (Signed)
[place]. [facility]  Patient:    BABYBOY, LOYA Visit Number: [id number] MRN: [id number]          Service Type: SUR Location: 5000 5023 01 Attending Physician:  Marlowe Shores Dictated by:   Nicki Reaper, M.D. Proc. Date: 11/01/00 Admit Date:  10/29/2000                             Operative Report  PREOPERATIVE DIAGNOSIS:  Status post cat bite, left thumb.  POSTOPERATIVE DIAGNOSIS:  Status post cat bite, left thumb.  PROCEDURE:  Incision and drainage of deep palmar space, forearm, left arm.  SURGEON:  Nicki Reaper, M.D.  ASSISTANT:  Joaquin Courts, R.N.  ANESTHESIA:  General.  ANESTHESIOLOGIST:  Halford Decamp, M.D.  HISTORY:  The patient is a 70 year old male who was bitten by a cat in the pulp of his thumb.  He has undergone several procedures in an effort to get this infection under control, including drainage of a flexor pollicis longus tendon sheath infection, a palmar space infection.  He is admitted now with continued swelling, pain, fever.  DESCRIPTION OF PROCEDURE:  The patient is brought to the operating room, where a general endotracheal intubation anesthesia was carried out without difficulty.  He was prepped and draped using Betadine scrub and solution and left arm free.  The old sutures were removed from the palmar space thenar eminence drainage.  The wound was extended proximally and distally, the carpal tunnel released.  Very significant pressure was present.  A deep palmar space infection was immediately encountered on sweeping away the flexor tendons. This area was entirely opened.  The forearm was opened with purulence deep to the flexor tendon.  The flexor tendon showed moderate swelling, moderate fluid formation to all flexor, superficialis, and profundus.  The median nerve showed significant compression with significant hyperemia and hour glass deformity as it passed through the carpal canal.  The entire wound was  then copiously irrigated with saline and the area debrided of necrotic tissue, which was primarily tenosynovium.  The FPL tendons were intact.  One liter of bacitracin-containing saline solution was placed.  A vessel loop drain was placed in the depths of the wound and the remainder packed.  One 3-0 nylon suture was used to close the zigzag portion of the incision for the carpal retinaculum, carpal tunnel release, to cover the median nerve was applied.  A sterile compressive dressing and splint to the thumb and fingers was applied. The patient tolerated the procedure well and was taken to the recovery room for observation in satisfactory condition.  He is admitted to the hospital for IV antibiotics, and infectious disease consultation, which has been obtained. Dictated by:   Nicki Reaper, M.D. Attending Physician:  Marlowe Shores DD:  11/01/00 TD:  11/02/00 Job: [id number]

## 2010-05-28 NOTE — Op Note (Signed)
[place]. [facility]  Patient:    ZEFERINO, MOUNTS Visit Number: [id number] MRN: [id number]          Service Type: SUR Location: 5000 5023 01 Attending Physician:  Marlowe Shores Dictated by:   Artist Pais Mina Marble, M.D. Proc. Date: 10/29/00 Admit Date:  10/29/2000                             Operative Report  PREOPERATIVE DIAGNOSIS: Chronic infection of left hand, status post cat bite.  POSTOPERATIVE DIAGNOSIS: Chronic infection of left hand, status post cat bite.  OPERATION/PROCEDURE: Decompression and irrigation and debridement of mid palmar space and thenar space, forearm antebrachial fascia and flexor pollicis longus tendon sheath, as well as dorsum of the hand x 2.  SURGEON: Artist Pais. Mina Marble, M.D.  ASSISTANT: None.  ANESTHESIA: General.  ANESTHETIST: ______.  TOURNIQUET TIME: Thirty-five minutes.  COMPLICATIONS: None.  DRAINS: None.  Two indwelling catheters were left in the FPL sheath proximally and distally, as well as the carpal canal, and the wounds were packed open with Betadine-soaked 4 x 4s.  DESCRIPTION OF PROCEDURE: The patient was taken to the operating room and after the induction of adequate general anesthesia the left upper extremity was prepped and draped in the usual sterile fashion.  The arm was elevated for five minutes and the tourniquet inflated to 250 mm Hg.  At this point in time, previous I&D incision at the MP flexion crease and distally in the thumb and in the forearm, distally at the wrist flexion crease, were extended.  Cultures were taken from the FPL tendon sheath and purulent material was identified at the MP flexion crease as well as distally.  Distally debridement was undertaken in the soft tissues down to the FPL insertion and in the MP flexion crease it was extended over toward the thenar area and the thenar space was opened and decompressed as well.  At this point in time a mid palmar  incision was made over the index, long, and ring fingers and dissection was carried down through the subcutaneous tissue to the palmar fascia until the flexion sheaths were identified.  The flexor sheaths had edema-type fluid but no purulent material in them, and this area was fully irrigated.  The proximal-most incision in the forearm in the area of the wrist flexion crease was extended for 5 cm and the median nerve identified and retracted to the ulnar side.  A significant amount of scarring and debris was removed from the median nerve proximal to the carpal canal.  The FPL tendon was identified and it, too, was debrided of a fair amount of clot and nonviable material, and at this point in time a #5 pediatric feeding tube was placed into the FPL sheath; i.e., the carpal canal, and irrigated using antibiotic impregnated solution. Two separate dorsal incisions were made over the dorsum of the hand and purulent material was also encountered there, and it was cultured as well.  At this point in time, the FPL sheath both proximally and distally as well as the carpal tunnel were irrigated with a liter of antibiotic impregnated normal saline, followed by a liter of normal saline.  Next, the indwelling catheters were left, one at the FPL area in the A-1 pulley and the MP flexion, one proximally in the area of the carpal canal.  These wounds were then loosely closed with 4-0 nylon in simple sutures before packing was placed  in each of the wounds using Betadine-soaked 4 x 4s.  Xeroform was used to pack the distal thumb wound.  The patient was then placed in a sterile dressing of 4 x 4s, Kerlix, casting and a volar splint, and a Coban wrap.  The patient tolerated the procedure well and went to the recovery room in stable fashion. Dictated by:   Artist Pais Mina Marble, M.D. Attending Physician:  Marlowe Shores DD:  10/29/00 TD:  10/30/00 Job: [id number]

## 2010-05-28 NOTE — Op Note (Signed)
[place]. [facility]  Patient:    CORMAC, WINT Visit Number: [id number] MRN: [id number]          Service Type: Attending:  Nicki Reaper, M.D. Dictated by:   Nicki Reaper, M.D. Proc. Date: 10/19/00 Adm. Date:  10/19/00                             Operative Report  PREOPERATIVE DIAGNOSIS:  Cat bite felon left thumb.  POSTOPERATIVE DIAGNOSIS:  Cat bite felon left thumb.  OPERATION:  Incision and drainage cat bite left thumb.  SURGEON:  Nicki Reaper, M.D.  ASSISTANT:  R.N.  ANESTHESIA:  Metacarpal block.  HISTORY:  The patient is a 70 year old right hand dominant male who suffered a cat bite to his left thumb approximately one week ago.  He has had one attempt at drainage which was unsuccessful.  He has been on Augmentin.  PROCEDURE:  The patient was brought to the operating room.  A metacarpal block given with 1% xylocaine without epinephrine.  He was prepped and draped using DuraPrep.  A tourniquet was placed high on the arm and was inflated to 250 mmHg.  An oblique incision was made using the old incision and carried down through the subcutaneous tissues.  With blunt and sharp dissection this was carried down to the depths of the wound down into the distal phalanx.  The entire area was opened and cultures were taken.  A mild amount of serous purulent fluid was encountered.  Cultures were taken for both aerobic and anaerobic cultures.  The wound was irrigated and packed open.  A sterile compressive dressing was applied.  The patient tolerated the procedure well.  He was discharged home to return on Monday on Augmentin and Talwin NX. Dictated by:   Nicki Reaper, M.D. Attending:  Nicki Reaper, M.D. DD:  10/19/00 TD:  10/19/00 Job: [id number]

## 2010-05-28 NOTE — Discharge Summary (Signed)
[place]. [facility]  Patient:    CHRISTY, FRIEDE Visit Number: [id number] MRN: [id number]          Service Type: SUR Location: 5000 5023 01 Attending Physician:  Marlowe Shores Dictated by:   Artist Pais Mina Marble, M.D. Admit Date:  10/29/2000 Discharge Date: 11/06/2000                             Discharge Summary  DISCHARGE DIAGNOSIS:  Left hand infection, status post cat fight.  PROCEDURE:  Irrigation and debridement of left hand, wrist and thumb on multiple occasions.  HISTORY OF PRESENT ILLNESS:  Mr. Zander Ingham is a 70 year old male who had underwent I&D of his left thumb and wrist by Dr. Cindee Salt three days prior to admission.  He presented on October 20, with pain, swelling and discharge from his operative sites.  He was an otherwise healthy, 70 year old, male.  ALLERGY:  CODEINE.  MEDICATION:  Unasyn.  PAST MEDICAL HISTORY:  Left shoulder surgery two years ago.  FAMILY HISTORY:  No significant family history.  SOCIAL HISTORY:  No significant social history.  HOSPITAL COURSE:  He was taken to the operating room that night for irrigation and debridement.  Postoperatively, he was placed on continued Unasyn. Infectious disease was also consulted and vancomycin was added as he did grow out some beta Streptococcus.  He continued doing fairly well postoperatively. However, on postop day #4, he had significant pain and swelling.  He was taken back to the operating room.  He was redrained where a large abscess was found. He had two subsequent washouts for a total of three washouts in the hospital. His care was taken over by Dr. Merlyn Lot.  He was placed on the antibiotics and his wounds were left open.  He was discharged on October 28.  FOLLOWUP:  Follow up with Dr. Merlyn Lot the next day in the office.  DISCHARGE MEDICATIONS:  IV vancomycin and IV Unasyn per ID. Dictated by:   Artist Pais Mina Marble, M.D. Attending Physician:  Marlowe Shores DD:  11/22/00 TD:  11/22/00 Job: [id number]

## 2010-06-29 ENCOUNTER — Ambulatory Visit: Payer: Self-pay | Admitting: Orthopedic Surgery

## 2010-06-29 ENCOUNTER — Encounter: Payer: Self-pay | Admitting: Orthopedic Surgery

## 2010-09-01 ENCOUNTER — Other Ambulatory Visit: Payer: Self-pay

## 2010-09-01 ENCOUNTER — Telehealth: Payer: Self-pay | Admitting: Cardiology

## 2010-09-01 MED ORDER — CLOPIDOGREL BISULFATE 75 MG PO TABS: 75.0000 mg | ORAL_TABLET | Freq: Every day | ORAL | Status: DC

## 2010-09-01 NOTE — Telephone Encounter (Signed)
Patient wants to know if he has been taken off of Plavix.  Says if he has not, he needs RX called in. He was initially getting his Plavix from [facility]. Will need refill called in to [facility] in [place]. / tg

## 2010-10-08 LAB — CBC
HCT: 43
Hemoglobin: 14.7
Hemoglobin: 15.1
MCHC: 34.1
RDW: 14.5
WBC: 6.7

## 2010-10-08 LAB — COMPREHENSIVE METABOLIC PANEL
Alkaline Phosphatase: 74
BUN: 20
CO2: 24
Chloride: 107
GFR calc non Af Amer: >60
Glucose, Bld: 98
Potassium: 3.7
Total Bilirubin: 0.5

## 2010-10-08 LAB — DIFFERENTIAL
Basophils Absolute: 0
Basophils Absolute: 0
Basophils Relative: 0
Basophils Relative: 1
Monocytes Absolute: 0.7
Monocytes Absolute: 0.9
Neutro Abs: 3.2
Neutro Abs: 4.3
Neutrophils Relative %: 47

## 2010-10-08 LAB — PROTIME-INR
INR: >9.8
Prothrombin Time: >90 — ABNORMAL HIGH

## 2010-10-08 LAB — OCCULT BLOOD X 1 CARD TO LAB, STOOL
Fecal Occult Bld: NEGATIVE
Fecal Occult Bld: NEGATIVE

## 2010-10-19 ENCOUNTER — Other Ambulatory Visit (HOSPITAL_COMMUNITY): Payer: Self-pay | Admitting: Internal Medicine

## 2010-10-19 DIAGNOSIS — N281 Cyst of kidney, acquired: Secondary | ICD-10-CM

## 2010-10-19 DIAGNOSIS — R109 Unspecified abdominal pain: Secondary | ICD-10-CM

## 2010-10-21 ENCOUNTER — Ambulatory Visit (HOSPITAL_COMMUNITY)
Admission: RE | Admit: 2010-10-21 | Discharge: 2010-10-21 | Disposition: A | Discharge status: Home / Self Care | Payer: Medicare Other | Source: Ambulatory Visit | Attending: Internal Medicine | Admitting: Internal Medicine

## 2010-10-21 DIAGNOSIS — R1031 Right lower quadrant pain: Secondary | ICD-10-CM | POA: Insufficient documentation

## 2010-10-21 DIAGNOSIS — N281 Cyst of kidney, acquired: Secondary | ICD-10-CM

## 2010-10-21 DIAGNOSIS — R109 Unspecified abdominal pain: Secondary | ICD-10-CM

## 2011-01-12 ENCOUNTER — Encounter: Payer: Self-pay | Admitting: Cardiology

## 2011-01-12 ENCOUNTER — Ambulatory Visit (INDEPENDENT_AMBULATORY_CARE_PROVIDER_SITE_OTHER): Payer: BLUE CROSS/BLUE SHIELD | Admitting: Cardiology

## 2011-01-12 DIAGNOSIS — I10 Essential (primary) hypertension: Secondary | ICD-10-CM

## 2011-01-12 DIAGNOSIS — M1990 Unspecified osteoarthritis, unspecified site: Secondary | ICD-10-CM

## 2011-01-12 DIAGNOSIS — I82409 Acute embolism and thrombosis of unspecified deep veins of unspecified lower extremity: Secondary | ICD-10-CM | POA: Insufficient documentation

## 2011-01-12 DIAGNOSIS — I2510 Atherosclerotic heart disease of native coronary artery without angina pectoris: Secondary | ICD-10-CM

## 2011-01-12 DIAGNOSIS — K219 Gastro-esophageal reflux disease without esophagitis: Secondary | ICD-10-CM | POA: Insufficient documentation

## 2011-01-12 DIAGNOSIS — Z720 Tobacco use: Secondary | ICD-10-CM

## 2011-01-12 DIAGNOSIS — E785 Hyperlipidemia, unspecified: Secondary | ICD-10-CM | POA: Insufficient documentation

## 2011-01-12 DIAGNOSIS — F17200 Nicotine dependence, unspecified, uncomplicated: Secondary | ICD-10-CM

## 2011-01-12 MED ORDER — LISINOPRIL 20 MG PO TABS: 20.0000 mg | ORAL_TABLET | Freq: Every day | ORAL | Status: DC

## 2011-01-12 MED ORDER — CARVEDILOL 12.5 MG PO TABS: 12.5000 mg | ORAL_TABLET | Freq: Two times a day (BID) | ORAL | Status: DC

## 2011-01-12 NOTE — Assessment & Plan Note (Signed)
Mr. Sahakian is doing generally well with respect to coronary disease.  His current symptoms likely reflect physical deconditioning and being overweight.  Increased activity and decrease caloric intake were recommended.

## 2011-01-12 NOTE — Assessment & Plan Note (Signed)
Based upon the most recent lipid profile from 1 year ago, modification of the patient's lipid lowering regime will likely be necessary.  Repeat serum measurements will be obtained prior to changing his medication.

## 2011-01-12 NOTE — Patient Instructions (Signed)
Your physician recommends that you schedule a follow-up appointment in: 2 months  Your physician recommends that you return for lab work in: Within the week  Your physician has recommended you make the following change in your medication:  1 - INCREASE Coreg (Carvedilol) to 6.25 mg twice a day for 2 weeks then 12.5 mg twice a day thereafter 2 - INCREASE Lisinopril to 20 mg daily  Your physician has requested that you regularly monitor and record your blood pressure readings at home. Please use the same machine at the same time of day to check your readings and record them to bring to your follow-up visit.

## 2011-01-12 NOTE — Assessment & Plan Note (Addendum)
Importance of tobacco cessation discussed with patient.  He has never previously even attempted to quit.  He will do so over the next few months.  If unsuccessful, pharmacologic therapy will be considered.

## 2011-01-12 NOTE — Assessment & Plan Note (Addendum)
Blood pressure control somewhat suboptimal.   Carvedilol will be increased in a stepwise fashion to 12.5 mg b.i.d. And lisinopril to 20 mg per day.  Patient will monitor blood pressure at a local pharmacy.

## 2011-01-12 NOTE — Progress Notes (Signed)
Patient ID: Alex Lara, male   DOB: Oct 27, 1940, 71 y.o.   MRN: [id number] HPI: Return visit as scheduled for this very pleasant gentleman with coronary artery disease and multiple cardiovascular risk factors.  Since his last visit, he has done quite well.  He experiences momentary episodes of dyspnea on rare occasions and similarly has very infrequent episodes of atypical chest discomfort.  In season, he is active in the [employer], but he has been sedentary of late with some increasing dyspnea and decline in exercise tolerance.  Patient was seen in the emergency department for abdominal discomfort in 2011.  CT Scan at that time was unremarkable except for small bilateral inguinal hernias, small renal cysts and mild atherosclerotic changes of the abdominal aorta.  Prior to Admission medications   Medication Sig Start Date End Date Taking? Authorizing Provider  Ascorbic Acid (VITAMIN C) 1000 MG tablet Take 1,000 mg by mouth daily.     Yes Historical Provider, MD  aspirin 81 MG tablet Take by mouth daily.     Yes Historical Provider, MD  busPIRone (BUSPAR) 15 MG tablet Take 7.5 mg by mouth daily.     Yes Historical Provider, MD  cholecalciferol (VITAMIN D) 400 UNITS TABS Take 400 Units by mouth daily.     Yes Historical Provider, MD  clonazePAM (KLONOPIN) 0.5 MG tablet Take 0.5 mg by mouth 4 (four) times daily as needed.     Yes Historical Provider, MD  clopidogrel (PLAVIX) 75 MG tablet Take 1 tablet (75 mg total) by mouth daily. 09/01/10 09/01/11 Yes Alex Reining, NP  fish oil-omega-3 fatty acids 1000 MG capsule Take 2 g by mouth daily.     Yes Historical Provider, MD  FLUoxetine (PROZAC) 20 MG capsule Take 40 mg by mouth daily.     Yes Historical Provider, MD  ibuprofen (ADVIL,MOTRIN) 100 MG tablet Take 100 mg by mouth every 6 (six) hours as needed.     Yes Historical Provider, MD  Misc Natural Products (OSTEO BI-FLEX JOINT SHIELD PO) Take by mouth.     Yes Historical Provider, MD    nitroGLYCERIN (NITROSTAT) 0.4 MG SL tablet Place 0.4 mg under the tongue every 5 (five) minutes as needed.     Yes Historical Provider, MD  pantoprazole (PROTONIX) 40 MG tablet Take 40 mg by mouth daily.     Yes Historical Provider, MD  simvastatin (ZOCOR) 40 MG tablet Take 40 mg by mouth at bedtime.     Yes Historical Provider, MD  TraMADol HCl 50 MG TBDP Take 50 mg by mouth 3 (three) times daily.     Yes Historical Provider, MD  vitamin E (VITAMIN E) 400 UNIT capsule Take 400 Units by mouth daily.     Yes Historical Provider, MD  carvedilol (COREG) 12.5 MG tablet Take 1 tablet (12.5 mg total) by mouth 2 (two) times daily with a meal. 01/12/11 01/12/12  Alex Friends. Wen Merced, MD  lisinopril (PRINIVIL,ZESTRIL) 20 MG tablet Take 1 tablet (20 mg total) by mouth daily. 01/12/11 01/12/12  Alex Friends. Alex Pates, MD    No Known Allergies    Past medical history, social history, and family history reviewed and updated.  ROS: Denies orthopnea, PND, pedal edema, palpitations, lightheadedness or syncope.  PHYSICAL EXAM: BP 156/89  Pulse 58  Ht 6\' 1"  (1.854 m)  Wt 98.884 kg (218 lb)  BMI 28.76 kg/m2; 15 pound weight gain over the past year General-Well developed; no acute distress Body habitus-proportionate weight and height Neck-No JVD; no carotid  bruits Lungs-clear lung fields; resonant to percussion; increased A-P diameter Cardiovascular-normal PMI; normal S1 and S2; modest systolic ejection murmur Abdomen-normal bowel sounds; soft and non-tender without masses or organomegaly Musculoskeletal-No deformities, no cyanosis or clubbing Neurologic-Normal cranial nerves; symmetric strength and tone Skin-Warm, no significant lesions Extremities-distal pulses intact except for a decreased left posterior tibial; trace edema  EKG: Sinus bradycardia at a rate of 59 bpm; within normal limits.  No previous tracing for comparison.  ASSESSMENT AND PLAN:  [place] Bing, MD 01/12/2011 3:44 PM

## 2011-01-14 LAB — CBC
HCT: 40.4 % (ref 39.0–52.0)
MCHC: 33.2 g/dL (ref 30.0–36.0)
Platelets: 157 10*3/uL (ref 150–400)
RDW: 14.2 % (ref 11.5–15.5)
WBC: 4.1 10*3/uL (ref 4.0–10.5)

## 2011-01-14 LAB — COMPREHENSIVE METABOLIC PANEL
ALT: 23 U/L (ref 0–53)
AST: 32 U/L (ref 0–37)
Albumin: 4.1 g/dL (ref 3.5–5.2)
Alkaline Phosphatase: 78 U/L (ref 39–117)
Potassium: 4.7 mEq/L (ref 3.5–5.3)
Sodium: 137 mEq/L (ref 135–145)
Total Protein: 6.8 g/dL (ref 6.0–8.3)

## 2011-01-14 LAB — LIPID PANEL
Total CHOL/HDL Ratio: 6.2 Ratio
VLDL: 47 mg/dL — ABNORMAL HIGH (ref 0–40)

## 2011-02-03 ENCOUNTER — Telehealth: Payer: Self-pay | Admitting: Cardiology

## 2011-02-03 ENCOUNTER — Other Ambulatory Visit: Payer: Self-pay | Admitting: Cardiology

## 2011-02-03 NOTE — Telephone Encounter (Signed)
PT WAS TOOK OFF OF CARVEDILOL AND SWITCHED TO LISINOPRIL. HE SAYS IT MAKES HIS STOMIC HURT EVERYDAY AND WOULD LIKE [place] TO CALL HIM IN LISINOPRIL AGAIN

## 2011-02-04 ENCOUNTER — Telehealth: Payer: Self-pay | Admitting: *Deleted

## 2011-02-04 ENCOUNTER — Other Ambulatory Visit: Payer: Self-pay | Admitting: *Deleted

## 2011-02-18 ENCOUNTER — Telehealth: Payer: Self-pay | Admitting: *Deleted

## 2011-02-18 NOTE — Telephone Encounter (Signed)
Patient having stomach cramps since starting on Coreg.  Please advise.

## 2011-02-19 NOTE — Telephone Encounter (Signed)
According to the series of messages, patient was changed to lisinopril, which also caused abdominal discomfort, but according to the OV note he was already taking lisinopril.  It is unclear to me what he is now taking.  He had a history of abdominal discomfort not apparently related to medication.  These issues would best to managed by Dr. Ouida Sills.  I will defer adjustment to antihypertensive regime to him.

## 2011-02-21 ENCOUNTER — Other Ambulatory Visit (HOSPITAL_COMMUNITY): Payer: Self-pay | Admitting: Internal Medicine

## 2011-02-21 ENCOUNTER — Telehealth: Payer: Self-pay | Admitting: *Deleted

## 2011-02-21 ENCOUNTER — Ambulatory Visit (HOSPITAL_COMMUNITY)
Admission: RE | Admit: 2011-02-21 | Discharge: 2011-02-21 | Disposition: A | Discharge status: Home / Self Care | Payer: Medicare Other | Source: Ambulatory Visit | Attending: Internal Medicine | Admitting: Internal Medicine

## 2011-02-21 DIAGNOSIS — R0602 Shortness of breath: Secondary | ICD-10-CM

## 2011-02-21 NOTE — Telephone Encounter (Signed)
Patient advised to contact Dr Ouida Sills if cramping continued, per Dr Dietrich Pates, as he does not feel it is from his medication regimen.  Pt is to see PCP today and states that stomach cramping has subsided at this point.

## 2011-02-22 ENCOUNTER — Other Ambulatory Visit (HOSPITAL_COMMUNITY): Payer: Self-pay | Admitting: Internal Medicine

## 2011-02-22 DIAGNOSIS — R0602 Shortness of breath: Secondary | ICD-10-CM

## 2011-02-22 DIAGNOSIS — R7989 Other specified abnormal findings of blood chemistry: Secondary | ICD-10-CM

## 2011-02-24 ENCOUNTER — Other Ambulatory Visit (HOSPITAL_COMMUNITY): Payer: Self-pay | Admitting: Internal Medicine

## 2011-02-24 ENCOUNTER — Ambulatory Visit (HOSPITAL_COMMUNITY)
Admission: RE | Admit: 2011-02-24 | Discharge: 2011-02-24 | Disposition: A | Discharge status: Home / Self Care | Payer: Medicare Other | Source: Ambulatory Visit | Attending: Internal Medicine | Admitting: Internal Medicine

## 2011-02-24 DIAGNOSIS — R7989 Other specified abnormal findings of blood chemistry: Secondary | ICD-10-CM

## 2011-02-24 DIAGNOSIS — R0602 Shortness of breath: Secondary | ICD-10-CM | POA: Insufficient documentation

## 2011-02-24 DIAGNOSIS — R911 Solitary pulmonary nodule: Secondary | ICD-10-CM | POA: Insufficient documentation

## 2011-02-24 MED ORDER — IOHEXOL 350 MG/ML SOLN
100.0000 mL | Freq: Once | INTRAVENOUS | Status: AC | PRN
  Administered 2011-02-24: 100 mL via INTRAVENOUS

## 2011-04-01 ENCOUNTER — Ambulatory Visit: Payer: BLUE CROSS/BLUE SHIELD | Admitting: Cardiology

## 2011-04-11 ENCOUNTER — Ambulatory Visit: Payer: Self-pay | Admitting: Cardiology

## 2011-05-03 ENCOUNTER — Ambulatory Visit (INDEPENDENT_AMBULATORY_CARE_PROVIDER_SITE_OTHER): Payer: Medicare Other | Admitting: Adult Health

## 2011-05-03 ENCOUNTER — Encounter: Payer: Self-pay | Admitting: Adult Health

## 2011-05-03 VITALS — BP 124/78 | HR 56 | Resp 16 | Ht 73.0 in | Wt 212.0 lb

## 2011-05-03 DIAGNOSIS — E785 Hyperlipidemia, unspecified: Secondary | ICD-10-CM

## 2011-05-03 DIAGNOSIS — I2510 Atherosclerotic heart disease of native coronary artery without angina pectoris: Secondary | ICD-10-CM

## 2011-05-03 DIAGNOSIS — Z720 Tobacco use: Secondary | ICD-10-CM

## 2011-05-03 DIAGNOSIS — I10 Essential (primary) hypertension: Secondary | ICD-10-CM

## 2011-05-03 DIAGNOSIS — F17200 Nicotine dependence, unspecified, uncomplicated: Secondary | ICD-10-CM

## 2011-05-03 NOTE — Assessment & Plan Note (Signed)
He is followed by Dr Ouida Sills for assessment of this per yearly physical.

## 2011-05-03 NOTE — Assessment & Plan Note (Signed)
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 He had an episode of what appears to be CHF with fluid retention which occurred slowly over a 2 week period with wt gain.  Dr. Ouida Sills diuresed him back to his baseline wt, and he is feeling better. He continues on lasix prn, which he states is a 3 times a week. I would like him to be on daily lasix at least 20 mg. He wants to continue the prn dosing.  I will have an echocardiogram completed to evaluate his systolic function on current medical management with need to adjust his medications. If he continues to have significant LV dysfx, will need to discuss the possibility of ICD or repeat CV study for continued ischemia with known CAD. This will be at Dr. Marvel Plan discretion on follow-up.

## 2011-05-03 NOTE — Patient Instructions (Signed)
**Note De-identified Barnes Florek Obfuscation** Your physician recommends that you continue on your current medications as directed. Please refer to the Current Medication list given to you today. Your physician has requested that you have an echocardiogram. Echocardiography is a painless test that uses sound waves to create images of your heart. It provides your doctor with information about the size and shape of your heart and how well your heart's chambers and valves are working. This procedure takes approximately one hour. There are no restrictions for this procedure. Your physician recommends that you schedule a follow-up appointment in: 1 month. 

## 2011-05-03 NOTE — Assessment & Plan Note (Signed)
We have discussed this and he has no plans to quit at this time. I have discussed that this increases his risk of recurrent ischemic event. He verbalizes understanding but states he is under too much stress right now.

## 2011-05-03 NOTE — Assessment & Plan Note (Signed)
Blood pressure is well controlled at present.

## 2011-05-03 NOTE — Progress Notes (Signed)
HPI: Alex Lara is a 71 y/o patient of Dr. Dietrich Pates we are seeing for ongoing assessment and treatment of CAD with history of hypertension, hyperlipidemia, ongoing tobacco abuse. He was recently seen by Dr.Fagen about 5 weeks ago for increased LEE and fluid retention. He was placed on Lasix 40 mg with a 16 lb wt loss. He had follow-up labs (which I am requesting) after diureses, which the patient states were "ok." He states he still feels a little tired but is not working regularly. His most recent cardiac catheterization in 2010 demonstrated EF of 35%. Review of records does not show that he had been on diuretics regularly. He is now on lasix 40 mg prn only.      He unfortunately continues to smoke. He states that he is under a lot of stress with work and family life and has no plans to quit. He continues on antianxiety medications prescribed by Dr. Ouida Sills. He is without complaint today of DOE, or chest discomfort.  No Known Allergies  Current Outpatient Prescriptions  Medication Sig Dispense Refill  . Ascorbic Acid (VITAMIN C) 1000 MG tablet Take 1,000 mg by mouth daily.        Marland Kitchen aspirin 81 MG tablet Take by mouth daily.        . busPIRone (BUSPAR) 15 MG tablet Take 7.5 mg by mouth daily.        . carvedilol (COREG) 12.5 MG tablet Take 1 tablet (12.5 mg total) by mouth 2 (two) times daily with a meal.  180 tablet  2  . cholecalciferol (VITAMIN D) 400 UNITS TABS Take 400 Units by mouth daily.        . clonazePAM (KLONOPIN) 0.5 MG tablet Take 0.5 mg by mouth 4 (four) times daily as needed.        . clopidogrel (PLAVIX) 75 MG tablet Take 1 tablet (75 mg total) by mouth daily.  30 tablet  6  . dicyclomine (BENTYL) 10 MG capsule Take 10 mg by mouth as needed.      . fish oil-omega-3 fatty acids 1000 MG capsule Take 2 g by mouth daily.        Marland Kitchen FLUoxetine (PROZAC) 20 MG capsule Take 40 mg by mouth daily.        . furosemide (LASIX) 40 MG tablet Take 40 mg by mouth as needed.      Marland Kitchen lisinopril  (PRINIVIL,ZESTRIL) 20 MG tablet Take 1 tablet (20 mg total) by mouth daily.  90 tablet  3  . nitroGLYCERIN (NITROSTAT) 0.4 MG SL tablet Place 0.4 mg under the tongue every 5 (five) minutes as needed.        . pantoprazole (PROTONIX) 40 MG tablet Take 40 mg by mouth daily.        . simvastatin (ZOCOR) 40 MG tablet Take 40 mg by mouth at bedtime.        . TraMADol HCl 50 MG TBDP Take 50 mg by mouth 3 (three) times daily.        . vitamin E (VITAMIN E) 400 UNIT capsule Take 400 Units by mouth daily.          Past Medical History  Diagnosis Date  . Arteriosclerotic cardiovascular disease (ASCVD)     06/2008: DES to the RCA and LAD  . Hyperlipidemia   . Tobacco abuse   . Chronic anticoagulation   . Deep vein thrombosis   . Degenerative joint disease   . Carpal tunnel syndrome   . Anxiety and  depression   . Gastroesophageal reflux disease   . Hypertension     Past Surgical History  Procedure Date  . Inguinal hernia repair   . Rotator cuff repair   . Orif-unknown fracture   . Colonoscopy ?  Date  . Total hip arthroplasty 2004    Left    JXB:JYNWGN of systems complete and found to be negative unless listed above  PHYSICAL EXAM BP 124/78  Pulse 56  Resp 16  Ht 6\' 1"  (1.854 m)  Wt 212 lb (96.163 kg)  BMI 27.97 kg/m2  General: Well developed, well nourished, in no acute distress Head: Eyes PERRLA, No xanthomas.   Normal cephalic and atramatic  Lungs: Clear bilaterally to auscultation and percussion. Heart: HRRR S1 S2, without MRG.  Pulses are 2+ & equal.  No carotid bruit. No JVD.  No abdominal bruits. No femoral bruits. Abdomen: Bowel sounds are positive, abdomen soft and non-tender without masses or  Hernia's noted. Msk:  Back normal, normal gait. Normal strength and tone for age. Extremities: No clubbing, cyanosis or non-pitting edema to the left leg.  DP +1 Neuro: Alert and oriented X 3. Psych:  Good affect, responds appropriately    ASSESSMENT AND PLAN

## 2011-05-05 ENCOUNTER — Ambulatory Visit (HOSPITAL_COMMUNITY)
Admission: RE | Admit: 2011-05-05 | Discharge: 2011-05-05 | Disposition: A | Discharge status: Home / Self Care | Payer: Medicare Other | Source: Ambulatory Visit | Attending: Adult Health | Admitting: Adult Health

## 2011-05-05 DIAGNOSIS — I369 Nonrheumatic tricuspid valve disorder, unspecified: Secondary | ICD-10-CM

## 2011-05-05 DIAGNOSIS — E785 Hyperlipidemia, unspecified: Secondary | ICD-10-CM | POA: Insufficient documentation

## 2011-05-05 DIAGNOSIS — I2510 Atherosclerotic heart disease of native coronary artery without angina pectoris: Secondary | ICD-10-CM | POA: Insufficient documentation

## 2011-05-05 DIAGNOSIS — I10 Essential (primary) hypertension: Secondary | ICD-10-CM | POA: Insufficient documentation

## 2011-05-05 NOTE — Progress Notes (Signed)
*  PRELIMINARY RESULTS* Echocardiogram 2D Echocardiogram has been performed.  Alex Lara 05/05/2011, 12:39 PM

## 2011-05-22 ENCOUNTER — Other Ambulatory Visit: Payer: Self-pay | Admitting: Adult Health

## 2011-06-02 ENCOUNTER — Ambulatory Visit: Payer: Medicare Other | Admitting: Cardiology

## 2011-06-02 ENCOUNTER — Ambulatory Visit: Payer: Medicare Other | Admitting: Adult Health

## 2011-07-27 ENCOUNTER — Other Ambulatory Visit: Payer: Self-pay | Admitting: Cardiology

## 2012-01-05 ENCOUNTER — Emergency Department (HOSPITAL_COMMUNITY): Payer: Medicare Other

## 2012-01-05 ENCOUNTER — Emergency Department (HOSPITAL_COMMUNITY)
Admission: EM | Admit: 2012-01-05 | Discharge: 2012-01-05 | Disposition: A | Discharge status: Home / Self Care | Payer: Medicare Other | Attending: Emergency Medicine | Admitting: Emergency Medicine

## 2012-01-05 DIAGNOSIS — Z8739 Personal history of other diseases of the musculoskeletal system and connective tissue: Secondary | ICD-10-CM | POA: Insufficient documentation

## 2012-01-05 DIAGNOSIS — Z86718 Personal history of other venous thrombosis and embolism: Secondary | ICD-10-CM | POA: Insufficient documentation

## 2012-01-05 DIAGNOSIS — I10 Essential (primary) hypertension: Secondary | ICD-10-CM | POA: Insufficient documentation

## 2012-01-05 DIAGNOSIS — W278XXA Contact with other nonpowered hand tool, initial encounter: Secondary | ICD-10-CM | POA: Insufficient documentation

## 2012-01-05 DIAGNOSIS — S61209A Unspecified open wound of unspecified finger without damage to nail, initial encounter: Secondary | ICD-10-CM

## 2012-01-05 DIAGNOSIS — Y92009 Unspecified place in unspecified non-institutional (private) residence as the place of occurrence of the external cause: Secondary | ICD-10-CM | POA: Insufficient documentation

## 2012-01-05 DIAGNOSIS — F17200 Nicotine dependence, unspecified, uncomplicated: Secondary | ICD-10-CM | POA: Insufficient documentation

## 2012-01-05 DIAGNOSIS — IMO0002 Reserved for concepts with insufficient information to code with codable children: Secondary | ICD-10-CM | POA: Insufficient documentation

## 2012-01-05 DIAGNOSIS — E785 Hyperlipidemia, unspecified: Secondary | ICD-10-CM | POA: Insufficient documentation

## 2012-01-05 DIAGNOSIS — I2510 Atherosclerotic heart disease of native coronary artery without angina pectoris: Secondary | ICD-10-CM | POA: Insufficient documentation

## 2012-01-05 DIAGNOSIS — F341 Dysthymic disorder: Secondary | ICD-10-CM | POA: Insufficient documentation

## 2012-01-05 DIAGNOSIS — Z7982 Long term (current) use of aspirin: Secondary | ICD-10-CM | POA: Insufficient documentation

## 2012-01-05 DIAGNOSIS — Z79899 Other long term (current) drug therapy: Secondary | ICD-10-CM | POA: Insufficient documentation

## 2012-01-05 DIAGNOSIS — Y9389 Activity, other specified: Secondary | ICD-10-CM | POA: Insufficient documentation

## 2012-01-05 DIAGNOSIS — K219 Gastro-esophageal reflux disease without esophagitis: Secondary | ICD-10-CM | POA: Insufficient documentation

## 2012-01-05 MED ORDER — SULFAMETHOXAZOLE-TRIMETHOPRIM 800-160 MG PO TABS: 1.0000 | ORAL_TABLET | Freq: Two times a day (BID) | ORAL | Status: DC

## 2012-01-05 NOTE — ED Provider Notes (Signed)
History     CSN: [id number]  Arrival date & time 01/05/12  [place]   First MD Initiated Contact with Patient 01/05/12 1855      No chief complaint on file.   (Consider location/radiation/quality/duration/timing/severity/associated sxs/prior treatment) HPI Comments: Complains of laceration to left thumb. Patient reports that he accidentally cut the end of his thumb on a table saw.. Occurred at home just prior to arrival. Patient complains of mild to moderate pain.   Past Medical History  Diagnosis Date  . Arteriosclerotic cardiovascular disease (ASCVD)     06/2008: DES to the RCA and LAD  . Hyperlipidemia   . Tobacco abuse   . Chronic anticoagulation   . Deep vein thrombosis   . Degenerative joint disease   . Carpal tunnel syndrome   . Anxiety and depression   . Gastroesophageal reflux disease   . Hypertension     Past Surgical History  Procedure Date  . Inguinal hernia repair   . Rotator cuff repair   . Orif-unknown fracture   . Colonoscopy ?  Date  . Total hip arthroplasty 2004    Left    Family History  Problem Relation Age of Onset  . Coronary artery disease Father   . Coronary artery disease Mother     died post-CABG    History  Substance Use Topics  . Smoking status: Current Every Day Smoker -- 1.0 packs/day for 55 years  . Smokeless tobacco: Not on file  . Alcohol Use: No      Review of Systems  Skin: Positive for wound.    Allergies  Review of patient's allergies indicates no known allergies.  Home Medications   Current Outpatient Rx  Name  Route  Sig  Dispense  Refill  . VITAMIN C 1000 MG PO TABS   Oral   Take 1,000 mg by mouth daily.           . ASPIRIN 81 MG PO TABS   Oral   Take by mouth daily.           . BUSPIRONE HCL 15 MG PO TABS   Oral   Take 7.5 mg by mouth daily.           Marland Kitchen CARVEDILOL 12.5 MG PO TABS   Oral   Take 1 tablet (12.5 mg total) by mouth 2 (two) times daily with a meal.   180 tablet   2   .  CHOLECALCIFEROL 400 UNITS PO TABS   Oral   Take 400 Units by mouth daily.           Marland Kitchen CLONAZEPAM 0.5 MG PO TABS   Oral   Take 0.5 mg by mouth 4 (four) times daily as needed.           . CLOPIDOGREL BISULFATE 75 MG PO TABS      TAKE ONE TABLET BY MOUTH EVERY DAY   30 tablet   6   . DICYCLOMINE HCL 10 MG PO CAPS   Oral   Take 10 mg by mouth as needed.         . OMEGA-3 FATTY ACIDS 1000 MG PO CAPS   Oral   Take 2 g by mouth daily.           Marland Kitchen FLUOXETINE HCL 20 MG PO CAPS   Oral   Take 40 mg by mouth daily.           . FUROSEMIDE 40 MG PO TABS   Oral  Take 40 mg by mouth as needed.         Marland Kitchen LISINOPRIL 20 MG PO TABS   Oral   Take 1 tablet (20 mg total) by mouth daily.   90 tablet   3   . NITROSTAT 0.4 MG SL SUBL      DISSOLVE ONE TABLET UNDER THE TONGUE EVERY 5 MINUTES AS NEEDED FOR CHEST PAIN.  DO NOT EXCEED A TOTAL OF 3 DOSES IN 15 MINUTES   25 each   1     Patient needs office visit appointment.   Marland Kitchen PANTOPRAZOLE SODIUM 40 MG PO TBEC   Oral   Take 40 mg by mouth daily.           Marland Kitchen SIMVASTATIN 40 MG PO TABS   Oral   Take 40 mg by mouth at bedtime.           . TRAMADOL HCL 50 MG PO TBDP   Oral   Take 50 mg by mouth 3 (three) times daily.           Marland Kitchen VITAMIN E 400 UNITS PO CAPS   Oral   Take 400 Units by mouth daily.             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 Oozing blood.    ED Course  Procedures (including critical care time)  Labs Reviewed - No data to display No results found.   No diagnosis found.    MDM  Patient presents for evaluation of a laceration to the left thumb. Patient cut his thumb on a table saw. He has an avulsion of the distal tip of the thumb just across the distal nail bed. No repairs  necessary. Mood was cleaned here in the ER. He has continuous oozing of blood because he takes Plavix. Hemostatic dressing was placed on the wound. Patient will keep this in place until tomorrow, then perform local wound care. Patient reports that his tetanus is up to date, last administered approximately 3 years ago.        Gilda Crease, MD 01/05/12 2035

## 2012-01-05 NOTE — ED Notes (Signed)
Cut L thumb w/table saw.  Cut off tip of thumb just above top of nail.

## 2012-01-05 NOTE — ED Notes (Signed)
Patient with no complaints at this time. Respirations even and unlabored. Skin warm/dry. Discharge instructions reviewed with patient at this time. Patient given opportunity to voice concerns/ask questions. Patient discharged at this time and left Emergency Department with steady gait.   

## 2012-01-05 NOTE — ED Notes (Signed)
Dressing applied to thumb using quick-clot gauze.

## 2012-03-08 ENCOUNTER — Other Ambulatory Visit: Payer: Self-pay | Admitting: Cardiology

## 2012-03-08 NOTE — Telephone Encounter (Signed)
Patient has either cancelled or no showed last 9 appointments.  Letter will be sent to schedule appointment and script will be deferred to PCP until he is seen in the office.

## 2012-03-26 ENCOUNTER — Telehealth: Payer: Self-pay | Admitting: *Deleted

## 2012-03-26 ENCOUNTER — Other Ambulatory Visit: Payer: Self-pay | Admitting: *Deleted

## 2012-03-26 MED ORDER — CARVEDILOL 12.5 MG PO TABS: 12.5000 mg | ORAL_TABLET | Freq: Two times a day (BID) | ORAL | Status: DC

## 2012-03-26 NOTE — Telephone Encounter (Signed)
Discussed with patient that lisinopril needed to be refilled by Dr Ouida Sills, since dose has been managed by him.  1 month of Coreg sent and a follow up appointment made with Joni Reining on Friday to review all meds and plan, as he is past due for follow up.  Will notify pharmacy.  Verbalized understanding.

## 2012-03-26 NOTE — Telephone Encounter (Signed)
Noted incoming fax from [facility] to request refill on pt carvedilol 12.5mg  one tablet by mouth BID w meals, noted pt last OV pt experienced cramping from BP medication and pt was advised to have PCP manage BP medication regimen also noted instructions on incoming fax for the refills to come from pt PCP, declined refill and noted per PCP to fill

## 2012-03-28 NOTE — Progress Notes (Signed)
HPI: Mr. Broner is a 72 y/o patient of Dr. Dietrich Pates we are seeing for ongoing assessment and treatment of CAD with history of hypertension, hyperlipidemia, ongoing tobacco abuse.  He was placed on Lasix 40 mg with a 16 lb wt loss. He had follow-up labs (which I am requesting) after diureses, which the patient states were "ok." He states he still feels a little tired but is not working regularly. His most recent cardiac catheterization in 2010 demonstrated EF of 35%. He is now on lasix 40 mg prn only. He was last seen in April 2013 and had a followup echocardiogram completed shortly thereafter. This revealed normal LV function with an EF of 55-60% with grade 1 diastolic dysfunction. He is here for medication refills and ongoing follow up.   Has been seen by Dr. Ouida Sills within the last 6 weeks and has been placed on Lasix 40 mg daily. Has lost wt again. Feels better. No further edema but is complaining of DOE. He admits to eating a lot of hotdogs, chips and cold cuts, but does not add salt to his foods. He denies chest pain or LEE. He is active.        No Known Allergies  Current Outpatient Prescriptions  Medication Sig Dispense Refill  . Ascorbic Acid (VITAMIN C) 1000 MG tablet Take 1,000 mg by mouth daily.        Marland Kitchen aspirin 81 MG tablet Take by mouth daily.        . carvedilol (COREG) 12.5 MG tablet Take 12.5 mg by mouth daily. TAKES HALF TAB IN AM, HALF TAB IN PM      . clonazePAM (KLONOPIN) 0.5 MG tablet Take 0.5 mg by mouth 4 (four) times daily as needed. TAKE 2 TABS IN AM, TWO TABS AT LUNCH AND ONE TAB AT NIGHT      . clopidogrel (PLAVIX) 75 MG tablet TAKE ONE TABLET BY MOUTH EVERY DAY  30 tablet  6  . fish oil-omega-3 fatty acids 1000 MG capsule Take 2 g by mouth daily.        Marland Kitchen FLUoxetine (PROZAC) 20 MG capsule Take 40 mg by mouth daily.        . furosemide (LASIX) 40 MG tablet Take 40 mg by mouth daily.       Marland Kitchen lisinopril (PRINIVIL,ZESTRIL) 20 MG tablet Take 20 mg by mouth daily. TAKE HALF  TABLET IN AM, HALF TABLET AT NIGHT      . NEXIUM 40 MG capsule       . NITROSTAT 0.4 MG SL tablet DISSOLVE ONE TABLET UNDER THE TONGUE EVERY 5 MINUTES AS NEEDED FOR CHEST PAIN.  DO NOT EXCEED A TOTAL OF 3 DOSES IN 15 MINUTES  25 each  1  . simvastatin (ZOCOR) 40 MG tablet Take 20 mg by mouth at bedtime.       . TraMADol HCl 50 MG TBDP Take 50 mg by mouth 3 (three) times daily.         No current facility-administered medications for this visit.    Past Medical History  Diagnosis Date  . Arteriosclerotic cardiovascular disease (ASCVD)     06/2008: DES to the RCA and LAD  . Hyperlipidemia   . Tobacco abuse   . Chronic anticoagulation   . Deep vein thrombosis   . Degenerative joint disease   . Carpal tunnel syndrome   . Anxiety and depression   . Gastroesophageal reflux disease   . Hypertension     Past Surgical History  Procedure Laterality Date  . Inguinal hernia repair    . Rotator cuff repair    . Orif-unknown fracture    . Colonoscopy  ?  Date  . Total hip arthroplasty  2004    Left    ZOX:[id number] of systems complete and found to be negative unless listed above  PHYSICAL EXAM BP 115/70  Pulse 62  Ht 6' (1.829 m)  Wt 208 lb 4 oz (94.462 kg)  BMI 28.24 kg/m2  General: Well developed, well nourished, in no acute distress Head: Eyes PERRLA, No xanthomas.   Normal cephalic and atraumatic Several missing teeth.  Lungs: Clear bilaterally to auscultation and percussion. Heart: HRRR S1 S2, without MRG.  Pulses are 2+ & equal.            No carotid bruit. No JVD.  No abdominal bruits. No femoral bruits. Abdomen: Bowel sounds are positive, abdomen soft and non-tender without masses or                  Hernia's noted. Msk:  Back normal, normal gait. Normal strength and tone for age. Extremities: No clubbing, cyanosis or edema.  DP +1 Neuro: Alert and oriented X 3. Psych:  Good affect, responds appropriately  WUJ:WJXBJ bradycardia rate of 58 bpm.  ASSESSMENT AND PLAN

## 2012-03-29 ENCOUNTER — Ambulatory Visit (INDEPENDENT_AMBULATORY_CARE_PROVIDER_SITE_OTHER): Payer: Medicare Other | Admitting: Adult Health

## 2012-03-29 ENCOUNTER — Encounter: Payer: Self-pay | Admitting: Adult Health

## 2012-03-29 ENCOUNTER — Other Ambulatory Visit: Payer: Self-pay | Admitting: Adult Health

## 2012-03-29 VITALS — BP 115/70 | HR 62 | Ht 72.0 in | Wt 208.2 lb

## 2012-03-29 DIAGNOSIS — Z720 Tobacco use: Secondary | ICD-10-CM

## 2012-03-29 DIAGNOSIS — E785 Hyperlipidemia, unspecified: Secondary | ICD-10-CM

## 2012-03-29 DIAGNOSIS — I2510 Atherosclerotic heart disease of native coronary artery without angina pectoris: Secondary | ICD-10-CM

## 2012-03-29 DIAGNOSIS — F17200 Nicotine dependence, unspecified, uncomplicated: Secondary | ICD-10-CM

## 2012-03-29 DIAGNOSIS — I7090 Unspecified atherosclerosis: Secondary | ICD-10-CM

## 2012-03-29 DIAGNOSIS — I10 Essential (primary) hypertension: Secondary | ICD-10-CM

## 2012-03-29 NOTE — Assessment & Plan Note (Signed)
Ending of increased dyspnea on exertion but denies chest pain, fluid retention, or fatigue. He has been seen by Dr. Ouida Sills within the last 6 weeks due to fluid overload. He was placed on Lasix 40 mg daily as opposed to when necessary. He has loss 6-8 pounds since starting to take Lasix daily. I would not plan a stress Myoview at this time is he is not having complaints of chest pressure or discomfort and remains active.  Due to use of diuretic, I will check a BMET to evaluate kidney function. I discussed with him low-sodium diet. He eats a lot of cold cuts and a minimum of 5-6 hot dogs a day. I have discussed with him the salt content of hot dogs and other cold cuts. He is aware of it but states that he will try and cut back as that is one of his favorite foods. Last echocardiogram one year ago revealed normal LV function, echocardiogram a year prior to that revealed an EF of 35%. We will repeat this echo as he has been having ongoing complaints of fluid retention and is in need of Lasix daily.

## 2012-03-29 NOTE — Assessment & Plan Note (Signed)
Unfortunately he continues to smoke. I have discussed smoking cessation with him along with cardiovascular risk factors associated with ongoing use. He verbalizes understanding but is not willing to stop at this time. I advised him that his breathing status may be related to lung disease associated with his smoking habit.

## 2012-03-29 NOTE — Patient Instructions (Addendum)
Your physician recommends that you schedule a follow-up appointment in: 3 months  Your physician has requested that you have an echocardiogram. Echocardiography is a painless test that uses sound waves to create images of your heart. It provides your doctor with information about the size and shape of your heart and how well your heart's chambers and valves are working. This procedure takes approximately one hour. There are no restrictions for this procedure.  Low salt diet

## 2012-03-29 NOTE — Assessment & Plan Note (Signed)
He continues on statin therapy. Lab work is usually drawn by Dr. Ouida Sills. He is to see Dr. Ouida Sills next month for physical exam. We will defer fasting studies to Dr. Ouida Sills.

## 2012-03-29 NOTE — Assessment & Plan Note (Signed)
Blood pressure is excellent control on current medication regimen. We will not make any changes in his medicines, he will continue take labetalol 12.5 mg daily, along with lisinopril 20 mg daily. BMET is ordered.

## 2012-03-29 NOTE — Progress Notes (Deleted)
Name: Alex Lara    DOB: 05-Apr-1940  Age: 72 y.o.  MR#: [id number]       PCP:  Carylon Perches, MD      Insurance: Payor: [employer] [place] [employer]  Plan: BLUE MEDICARE  Product Type: *No Product type*    CC:    Chief Complaint  Patient presents with  . Hypertension  NOTED MULTIPLE MEDICATION CHANGES, FAGAN CHANGED CARVEDILOL 12.5MG  AND LISINOPRIL 20MG  AS HALF TAB IN THE AM AND HALF A TAB IN PM DUE TO FEELING DRUNK, NOTED CHANGE LAST FALL AND NO CONCERNS SINCE CHANGE   VS Filed Vitals:   03/29/12 1526  BP: 115/70  Pulse: 62  Height: 6' (1.829 m)  Weight: 208 lb 4 oz (94.462 kg)    Weights Current Weight  03/29/12 208 lb 4 oz (94.462 kg)  01/05/12 203 lb (92.08 kg)  05/03/11 212 lb (96.163 kg)    Blood Pressure  BP Readings from Last 3 Encounters:  03/29/12 115/70  01/05/12 169/99  05/03/11 124/78     Admit date:  (Not on file) Last encounter with RMR:  Visit date not found   Allergy Review of patient's allergies indicates no known allergies.  Current Outpatient Prescriptions  Medication Sig Dispense Refill  . Ascorbic Acid (VITAMIN C) 1000 MG tablet Take 1,000 mg by mouth daily.        Marland Kitchen aspirin 81 MG tablet Take by mouth daily.        . carvedilol (COREG) 12.5 MG tablet Take 12.5 mg by mouth daily. TAKES HALF TAB IN AM, HALF TAB IN PM      . clonazePAM (KLONOPIN) 0.5 MG tablet Take 0.5 mg by mouth 4 (four) times daily as needed. TAKE 2 TABS IN AM, TWO TABS AT LUNCH AND ONE TAB AT NIGHT      . clopidogrel (PLAVIX) 75 MG tablet TAKE ONE TABLET BY MOUTH EVERY DAY  30 tablet  6  . fish oil-omega-3 fatty acids 1000 MG capsule Take 2 g by mouth daily.        Marland Kitchen FLUoxetine (PROZAC) 20 MG capsule Take 40 mg by mouth daily.        . furosemide (LASIX) 40 MG tablet Take 40 mg by mouth daily.       Marland Kitchen lisinopril (PRINIVIL,ZESTRIL) 20 MG tablet Take 20 mg by mouth daily. TAKE HALF TABLET IN AM, HALF TABLET AT NIGHT      . NEXIUM 40 MG capsule       . NITROSTAT 0.4 MG SL  tablet DISSOLVE ONE TABLET UNDER THE TONGUE EVERY 5 MINUTES AS NEEDED FOR CHEST PAIN.  DO NOT EXCEED A TOTAL OF 3 DOSES IN 15 MINUTES  25 each  1  . simvastatin (ZOCOR) 40 MG tablet Take 20 mg by mouth at bedtime.       . TraMADol HCl 50 MG TBDP Take 50 mg by mouth 3 (three) times daily.         No current facility-administered medications for this visit.    Discontinued Meds:    Medications Discontinued During This Encounter  Medication Reason  . busPIRone (BUSPAR) 15 MG tablet Error  . cholecalciferol (VITAMIN D) 400 UNITS TABS Error  . dicyclomine (BENTYL) 10 MG capsule Error  . vitamin E (VITAMIN E) 400 UNIT capsule Error  . sulfamethoxazole-trimethoprim (SEPTRA DS) 800-160 MG per tablet Error  . pantoprazole (PROTONIX) 40 MG tablet Error  . carvedilol (COREG) 12.5 MG tablet   . lisinopril (PRINIVIL,ZESTRIL)  20 MG tablet     Patient Active Problem List  Diagnosis  . OSTEOARTHRITIS  . Arteriosclerotic cardiovascular disease (ASCVD)  . Hyperlipidemia  . Tobacco abuse  . Deep vein thrombosis  . Gastroesophageal reflux disease  . Hypertension    LABS    Component Value Date/Time   NA 137 01/12/2011 1505   NA 139 06/18/2008 0144   NA 134* 06/13/2008 0555   K 4.7 01/12/2011 1505   K 4.3 06/18/2008 0144   K 3.9 06/13/2008 0555   CL 102 01/12/2011 1505   CL 103 06/18/2008 0144   CL 102 06/13/2008 0555   CO2 22 01/12/2011 1505   CO2 23 06/18/2008 0144   CO2 25 06/13/2008 0555   GLUCOSE 105* 01/12/2011 1505   GLUCOSE 103* 06/18/2008 0144   GLUCOSE 103* 06/13/2008 0555   BUN 17 01/12/2011 1505   BUN 22 06/18/2008 0144   BUN 16 06/13/2008 0555   CREATININE 1.32 01/12/2011 1505   CREATININE 1.17 06/18/2008 0144   CREATININE 0.88 06/13/2008 0555   CREATININE 0.95 06/12/2008 0603   CALCIUM 9.1 01/12/2011 1505   CALCIUM 8.6 06/18/2008 0144   CALCIUM 8.6 06/13/2008 0555   GFRNONAA >60 06/13/2008 0555   GFRNONAA >60 06/12/2008 0603   GFRNONAA >60 08/08/2007 1657   GFRAA  Value: >60        The eGFR has been calculated  using the MDRD equation. This calculation has not been validated in all clinical situations. eGFR's persistently <60 mL/min signify possible Chronic Kidney Disease. 06/13/2008 0555   GFRAA  Value: >60        The eGFR has been calculated using the MDRD equation. This calculation has not been validated in all clinical situations. eGFR's persistently <60 mL/min signify possible Chronic Kidney Disease. 06/12/2008 0603   GFRAA  Value: >60        The eGFR has been calculated using the MDRD equation. This calculation has not been validated in all clinical 08/08/2007 1657   CMP     Component Value Date/Time   NA 137 01/12/2011 1505   K 4.7 01/12/2011 1505   CL 102 01/12/2011 1505   CO2 22 01/12/2011 1505   GLUCOSE 105* 01/12/2011 1505   BUN 17 01/12/2011 1505   CREATININE 1.32 01/12/2011 1505   CREATININE 1.17 06/18/2008 0144   CALCIUM 9.1 01/12/2011 1505   PROT 6.8 01/12/2011 1505   ALBUMIN 4.1 01/12/2011 1505   AST 32 01/12/2011 1505   ALT 23 01/12/2011 1505   ALKPHOS 78 01/12/2011 1505   BILITOT 0.5 01/12/2011 1505   GFRNONAA >60 06/13/2008 0555   GFRAA  Value: >60        The eGFR has been calculated using the MDRD equation. This calculation has not been validated in all clinical situations. eGFR's persistently <60 mL/min signify possible Chronic Kidney Disease. 06/13/2008 0555       Component Value Date/Time   WBC 4.1 01/12/2011 1505   WBC 5.3 10/15/2008 0000   WBC 6.2 06/13/2008 0550   HGB 13.4 01/12/2011 1505   HGB 13.7 10/15/2008 0000   HGB 14.4 06/13/2008 0550   HCT 40.4 01/12/2011 1505   HCT 42.1 10/15/2008 0000   HCT 41.7 06/13/2008 0550   MCV 93.1 01/12/2011 1505   MCV 96.1 10/15/2008 0000   MCV 94.1 06/13/2008 0550    Lipid Panel     Component Value Date/Time   CHOL 156 01/12/2011 1505   TRIG 233* 01/12/2011 1505   HDL 25* 01/12/2011 1505  CHOLHDL 6.2 01/12/2011 1505   VLDL 47* 01/12/2011 1505   LDLCALC 84 01/12/2011 1505    ABG No results found for this basename: phart, pco2, pco2art, po2, po2art, hco3, tco2, acidbasedef, o2sat      No results found for this basename: TSH   BNP (last 3 results) No results found for this basename: PROBNP,  in the last 8760 hours Cardiac Panel (last 3 results) No results found for this basename: CKTOTAL, CKMB, TROPONINI, RELINDX,  in the last 72 hours  Iron/TIBC/Ferritin No results found for this basename: iron, tibc, ferritin     EKG Orders placed in visit on 03/29/12  . EKG 12-LEAD     Prior Assessment and Plan Problem List as of 03/29/2012     ICD-9-CM   OSTEOARTHRITIS   Arteriosclerotic cardiovascular disease (ASCVD)   Last Assessment & Plan   05/03/2011 Office Visit Written 05/03/2011  4:21 PM by Jodelle Gross, NP     Per cardiac cath he has systolic dysfunction with EF of 35% but has had no follow-up echo since then. He had an episode of what appears to be CHF with fluid retention which occurred slowly over a 2 week period with wt gain.  Dr. Ouida Sills diuresed him back to his baseline wt, and he is feeling better. He continues on lasix prn, which he states is a 3 times a week. I would like him to be on daily lasix at least 20 mg. He wants to continue the prn dosing.  I will have an echocardiogram completed to evaluate his systolic function on current medical management with need to adjust his medications. If he continues to have significant LV dysfx, will need to discuss the possibility of ICD or repeat CV study for continued ischemia with known CAD. This will be at Dr. Marvel Plan discretion on follow-up.    Hyperlipidemia   Last Assessment & Plan   05/03/2011 Office Visit Written 05/03/2011  4:23 PM by Jodelle Gross, NP     He is followed by Dr Ouida Sills for assessment of this per yearly physical.    Tobacco abuse   Last Assessment & Plan   05/03/2011 Office Visit Written 05/03/2011  4:24 PM by Jodelle Gross, NP     We have discussed this and he has no plans to quit at this time. I have discussed that this increases his risk of recurrent ischemic event. He verbalizes  understanding but states he is under too much stress right now.     Deep vein thrombosis   Gastroesophageal reflux disease   Hypertension   Last Assessment & Plan   05/03/2011 Office Visit Written 05/03/2011  4:21 PM by Jodelle Gross, NP     Blood pressure is well controlled at present.        Imaging: No results found.

## 2012-03-30 ENCOUNTER — Encounter: Payer: Self-pay | Admitting: *Deleted

## 2012-03-30 LAB — BASIC METABOLIC PANEL
BUN: 32 mg/dL — ABNORMAL HIGH (ref 6–23)
CO2: 25 mEq/L (ref 19–32)
Chloride: 104 mEq/L (ref 96–112)
Creat: 1.31 mg/dL (ref 0.50–1.35)
Potassium: 4 mEq/L (ref 3.5–5.3)

## 2012-04-05 ENCOUNTER — Ambulatory Visit (HOSPITAL_COMMUNITY)
Admission: RE | Admit: 2012-04-05 | Discharge: 2012-04-05 | Disposition: A | Discharge status: Home / Self Care | Payer: Medicare Other | Source: Ambulatory Visit | Attending: Adult Health | Admitting: Adult Health

## 2012-04-05 DIAGNOSIS — I2510 Atherosclerotic heart disease of native coronary artery without angina pectoris: Secondary | ICD-10-CM

## 2012-04-05 DIAGNOSIS — F17200 Nicotine dependence, unspecified, uncomplicated: Secondary | ICD-10-CM | POA: Insufficient documentation

## 2012-04-05 DIAGNOSIS — E785 Hyperlipidemia, unspecified: Secondary | ICD-10-CM | POA: Insufficient documentation

## 2012-04-05 DIAGNOSIS — I509 Heart failure, unspecified: Secondary | ICD-10-CM | POA: Insufficient documentation

## 2012-04-05 DIAGNOSIS — I10 Essential (primary) hypertension: Secondary | ICD-10-CM

## 2012-04-05 DIAGNOSIS — I517 Cardiomegaly: Secondary | ICD-10-CM

## 2012-04-05 NOTE — Progress Notes (Signed)
*  PRELIMINARY RESULTS* Echocardiogram 2D Echocardiogram has been performed.  Alex Lara 04/05/2012, 11:57 AM

## 2012-05-18 ENCOUNTER — Other Ambulatory Visit: Payer: Self-pay | Admitting: Adult Health

## 2012-06-01 ENCOUNTER — Other Ambulatory Visit: Payer: Self-pay | Admitting: Adult Health

## 2012-06-02 ENCOUNTER — Other Ambulatory Visit: Payer: Self-pay | Admitting: Adult Health

## 2012-06-05 NOTE — Telephone Encounter (Signed)
rx sent to pharmacy by e-script  

## 2012-07-18 ENCOUNTER — Other Ambulatory Visit: Payer: Self-pay | Admitting: *Deleted

## 2012-07-18 MED ORDER — CLOPIDOGREL BISULFATE 75 MG PO TABS: ORAL_TABLET | ORAL | Status: DC

## 2012-11-01 ENCOUNTER — Encounter: Payer: Self-pay | Admitting: Adult Health

## 2012-11-01 ENCOUNTER — Ambulatory Visit (INDEPENDENT_AMBULATORY_CARE_PROVIDER_SITE_OTHER): Payer: Medicare Other | Admitting: Adult Health

## 2012-11-01 VITALS — BP 147/84 | HR 65 | Ht 73.0 in | Wt 209.0 lb

## 2012-11-01 DIAGNOSIS — I82402 Acute embolism and thrombosis of unspecified deep veins of left lower extremity: Secondary | ICD-10-CM

## 2012-11-01 DIAGNOSIS — I2510 Atherosclerotic heart disease of native coronary artery without angina pectoris: Secondary | ICD-10-CM

## 2012-11-01 DIAGNOSIS — F17200 Nicotine dependence, unspecified, uncomplicated: Secondary | ICD-10-CM

## 2012-11-01 DIAGNOSIS — I10 Essential (primary) hypertension: Secondary | ICD-10-CM

## 2012-11-01 DIAGNOSIS — Z720 Tobacco use: Secondary | ICD-10-CM

## 2012-11-01 DIAGNOSIS — I7090 Unspecified atherosclerosis: Secondary | ICD-10-CM

## 2012-11-01 DIAGNOSIS — E785 Hyperlipidemia, unspecified: Secondary | ICD-10-CM

## 2012-11-01 DIAGNOSIS — I82409 Acute embolism and thrombosis of unspecified deep veins of unspecified lower extremity: Secondary | ICD-10-CM

## 2012-11-01 NOTE — Assessment & Plan Note (Signed)
Currently moderately controlled. He is on lasix with lisinopril and coreg. He continues to eat salty foods despite education. He has decreased his lasix per Dr. Ouida Sills due to urinary frequency at night. He states that he is also being treated for prostatitis.  Continue to monitor his status. No changes in medications at this time.

## 2012-11-01 NOTE — Progress Notes (Signed)
Name: Alex Lara    DOB: 1940/09/17  Age: 72 y.o.  MR#: [id number]       PCP:  Carylon Perches, MD      Insurance: Payor: [employer] / Plan: BLUE MEDICARE / Product Type: *No Product type* /   CC:    Chief Complaint  Patient presents with  . Coronary Artery Disease  . Hypertension    VS Filed Vitals:   11/01/12 1308  BP: 147/84  Pulse: 65  Height: 6\' 1"  (1.854 m)  Weight: 209 lb (94.802 kg)    Weights Current Weight  11/01/12 209 lb (94.802 kg)  03/29/12 208 lb 4 oz (94.462 kg)  01/05/12 203 lb (92.08 kg)    Blood Pressure  BP Readings from Last 3 Encounters:  11/01/12 147/84  03/29/12 115/70  01/05/12 169/99     Admit date:  (Not on file) Last encounter with RMR:  06/02/2012   Allergy Review of patient's allergies indicates no known allergies.  Current Outpatient Prescriptions  Medication Sig Dispense Refill  . Ascorbic Acid (VITAMIN C) 1000 MG tablet Take 1,000 mg by mouth daily.        Marland Kitchen aspirin 81 MG tablet Take by mouth daily.        . carvedilol (COREG) 12.5 MG tablet Take 12.5 mg by mouth daily. TAKES HALF TAB IN AM, HALF TAB IN PM      . carvedilol (COREG) 12.5 MG tablet TAKE ONE TABLET BY MOUTH TWICE DAILY  60 tablet  3  . clonazePAM (KLONOPIN) 0.5 MG tablet Take 0.5 mg by mouth 4 (four) times daily as needed. TAKE 2 TABS IN AM, TWO TABS AT LUNCH AND ONE TAB AT NIGHT      . clopidogrel (PLAVIX) 75 MG tablet TAKE ONE TABLET BY MOUTH ONCE DAILY  30 tablet  0  . fish oil-omega-3 fatty acids 1000 MG capsule Take 2 g by mouth daily.        Marland Kitchen FLUoxetine (PROZAC) 20 MG capsule Take 40 mg by mouth daily.        . furosemide (LASIX) 40 MG tablet Take 20 mg by mouth daily.       . Multiple Vitamins-Minerals (CENTRUM SILVER PO) Take 1 tablet by mouth daily.      Marland Kitchen NEXIUM 40 MG capsule       . NITROSTAT 0.4 MG SL tablet DISSOLVE ONE TABLET UNDER THE TONGUE EVERY 5 MINUTES AS NEEDED FOR CHEST PAIN.  DO NOT EXCEED A TOTAL OF 3 DOSES IN 15 MINUTES   25 each  1  . simvastatin (ZOCOR) 40 MG tablet Take 20 mg by mouth at bedtime.       . TraMADol HCl 50 MG TBDP Take 50 mg by mouth 3 (three) times daily.        Marland Kitchen lisinopril (PRINIVIL,ZESTRIL) 20 MG tablet Take 20 mg by mouth daily. TAKE HALF TABLET IN AM, HALF TABLET AT NIGHT       No current facility-administered medications for this visit.    Discontinued Meds:   There are no discontinued medications.  Patient Active Problem List   Diagnosis Date Noted  . Arteriosclerotic cardiovascular disease (ASCVD)   . Hyperlipidemia   . Tobacco abuse   . Deep vein thrombosis   . Gastroesophageal reflux disease   . Hypertension   . OSTEOARTHRITIS 02/14/2006    LABS    Component Value Date/Time   NA 137 03/29/2012 1713   NA 137 01/12/2011 1505  NA 139 06/18/2008 0144   K 4.0 03/29/2012 1713   K 4.7 01/12/2011 1505   K 4.3 06/18/2008 0144   CL 104 03/29/2012 1713   CL 102 01/12/2011 1505   CL 103 06/18/2008 0144   CO2 25 03/29/2012 1713   CO2 22 01/12/2011 1505   CO2 23 06/18/2008 0144   GLUCOSE 82 03/29/2012 1713   GLUCOSE 105* 01/12/2011 1505   GLUCOSE 103* 06/18/2008 0144   BUN 32* 03/29/2012 1713   BUN 17 01/12/2011 1505   BUN 22 06/18/2008 0144   CREATININE 1.31 03/29/2012 1713   CREATININE 1.32 01/12/2011 1505   CREATININE 1.17 06/18/2008 0144   CREATININE 0.88 06/13/2008 0555   CREATININE 0.95 06/12/2008 0603   CALCIUM 9.2 03/29/2012 1713   CALCIUM 9.1 01/12/2011 1505   CALCIUM 8.6 06/18/2008 0144   GFRNONAA >60 06/13/2008 0555   GFRNONAA >60 06/12/2008 0603   GFRNONAA >60 08/08/2007 1657   GFRAA  Value: >60        The eGFR has been calculated using the MDRD equation. This calculation has not been validated in all clinical situations. eGFR's persistently <60 mL/min signify possible Chronic Kidney Disease. 06/13/2008 0555   GFRAA  Value: >60        The eGFR has been calculated using the MDRD equation. This calculation has not been validated in all clinical situations. eGFR's persistently <60 mL/min signify possible  Chronic Kidney Disease. 06/12/2008 0603   GFRAA  Value: >60        The eGFR has been calculated using the MDRD equation. This calculation has not been validated in all clinical 08/08/2007 1657   CMP     Component Value Date/Time   NA 137 03/29/2012 1713   K 4.0 03/29/2012 1713   CL 104 03/29/2012 1713   CO2 25 03/29/2012 1713   GLUCOSE 82 03/29/2012 1713   BUN 32* 03/29/2012 1713   CREATININE 1.31 03/29/2012 1713   CREATININE 1.17 06/18/2008 0144   CALCIUM 9.2 03/29/2012 1713   PROT 6.8 01/12/2011 1505   ALBUMIN 4.1 01/12/2011 1505   AST 32 01/12/2011 1505   ALT 23 01/12/2011 1505   ALKPHOS 78 01/12/2011 1505   BILITOT 0.5 01/12/2011 1505   GFRNONAA >60 06/13/2008 0555   GFRAA  Value: >60        The eGFR has been calculated using the MDRD equation. This calculation has not been validated in all clinical situations. eGFR's persistently <60 mL/min signify possible Chronic Kidney Disease. 06/13/2008 0555       Component Value Date/Time   WBC 4.1 01/12/2011 1505   WBC 5.3 10/15/2008 0000   WBC 6.2 06/13/2008 0550   HGB 13.4 01/12/2011 1505   HGB 13.7 10/15/2008 0000   HGB 14.4 06/13/2008 0550   HCT 40.4 01/12/2011 1505   HCT 42.1 10/15/2008 0000   HCT 41.7 06/13/2008 0550   MCV 93.1 01/12/2011 1505   MCV 96.1 10/15/2008 0000   MCV 94.1 06/13/2008 0550    Lipid Panel     Component Value Date/Time   CHOL 156 01/12/2011 1505   TRIG 233* 01/12/2011 1505   HDL 25* 01/12/2011 1505   CHOLHDL 6.2 01/12/2011 1505   VLDL 47* 01/12/2011 1505   LDLCALC 84 01/12/2011 1505    ABG No results found for this basename: phart, pco2, pco2art, po2, po2art, hco3, tco2, acidbasedef, o2sat     No results found for this basename: TSH   BNP (last 3 results) No results found for this basename:  PROBNP,  in the last 8760 hours Cardiac Panel (last 3 results) No results found for this basename: CKTOTAL, CKMB, TROPONINI, RELINDX,  in the last 72 hours  Iron/TIBC/Ferritin No results found for this basename: iron, tibc, ferritin     EKG Orders  placed in visit on 03/29/12  . EKG 12-LEAD     Prior Assessment and Plan Problem List as of 11/01/2012     Cardiovascular and Mediastinum   Arteriosclerotic cardiovascular disease (ASCVD)   Last Assessment & Plan   03/29/2012 Office Visit Written 03/29/2012  4:07 PM by Jodelle Gross, NP     Ending of increased dyspnea on exertion but denies chest pain, fluid retention, or fatigue. He has been seen by Dr. Ouida Sills within the last 6 weeks due to fluid overload. He was placed on Lasix 40 mg daily as opposed to when necessary. He has loss 6-8 pounds since starting to take Lasix daily. I would not plan a stress Myoview at this time is he is not having complaints of chest pressure or discomfort and remains active.  Due to use of diuretic, I will check a BMET to evaluate kidney function. I discussed with him low-sodium diet. He eats a lot of cold cuts and a minimum of 5-6 hot dogs a day. I have discussed with him the salt content of hot dogs and other cold cuts. He is aware of it but states that he will try and cut back as that is one of his favorite foods. Last echocardiogram one year ago revealed normal LV function, echocardiogram a year prior to that revealed an EF of 35%. We will repeat this echo as he has been having ongoing complaints of fluid retention and is in need of Lasix daily.    Deep vein thrombosis   Hypertension   Last Assessment & Plan   03/29/2012 Office Visit Written 03/29/2012  4:07 PM by Jodelle Gross, NP     Blood pressure is excellent control on current medication regimen. We will not make any changes in his medicines, he will continue take labetalol 12.5 mg daily, along with lisinopril 20 mg daily. BMET is ordered.      Digestive   Gastroesophageal reflux disease     Musculoskeletal and Integument   OSTEOARTHRITIS     Other   Hyperlipidemia   Last Assessment & Plan   03/29/2012 Office Visit Written 03/29/2012  4:08 PM by Jodelle Gross, NP     He continues on  statin therapy. Lab work is usually drawn by Dr. Ouida Sills. He is to see Dr. Ouida Sills next month for physical exam. We will defer fasting studies to Dr. Ouida Sills.    Tobacco abuse   Last Assessment & Plan   03/29/2012 Office Visit Written 03/29/2012  4:09 PM by Jodelle Gross, NP     Unfortunately he continues to smoke. I have discussed smoking cessation with him along with cardiovascular risk factors associated with ongoing use. He verbalizes understanding but is not willing to stop at this time. I advised him that his breathing status may be related to lung disease associated with his smoking habit.        Imaging: No results found.

## 2012-11-01 NOTE — Assessment & Plan Note (Signed)
Now between 1-1 1/2 ppd. He states he "burns more that he smokes." He does not want to entertain quitting at this time despite his CVRF and CAD.

## 2012-11-01 NOTE — Assessment & Plan Note (Signed)
He is without cardiac complaints. He continues to smoke and eat unhealthy foods which I have again re-educated him about. He does not appear inclined to want to make changes, despite my warnings to the contrary. We will continue to do our best with risk management strategies. He will continue on ASA,Coreg and statin.

## 2012-11-01 NOTE — Patient Instructions (Signed)
Your physician recommends that you schedule a follow-up appointment in: 6 months You will receive a reminder letter two months in advance reminding you to call and schedule your appointment. If you don't receive this letter, please contact our office.  Your physician recommends that you continue on your current medications as directed. Please refer to the Current Medication list given to you today.     

## 2012-11-01 NOTE — Assessment & Plan Note (Signed)
Continues on Plavix with chronic LLE edema.

## 2012-11-01 NOTE — Progress Notes (Signed)
HPI: [name] is a 72 year-old former patient of Dr. Dietrich Pates we are seeing for ongoing assessment of treatment of CAD with history of hypertension, hyperlipidemia, ongoing tobacco abuse. He will be reassigned to Dr. Christiane Ha branch. In the office in March of 2014. The patient is having issues with fluid retention, with most recent echocardiogram demonstrating an EF of 55-60% with grade 1 diastolic dysfunction. The patient admits to dietary noncompliance. Echocardiogram was repeated.   This revealed LVEF of 60-65% with small region of mild basilar hypokinesis. The cavity size is normal wall thickness was increased in a pattern of mild LVH.    He comes today with no change in status. He continues to smoke 1 ppd and eat salty foods. He states that he got a phone call from his daughter yesterday stating that she has been diagnosed with Stage IV melanoma. He is distressed about that. 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 He is now reduced on his lasix to 20 mg daily from 40 mg daily due to nocturnal urinary frequency.      No Known Allergies  Current Outpatient Prescriptions  Medication Sig Dispense Refill  . Ascorbic Acid (VITAMIN C) 1000 MG tablet Take 1,000 mg by mouth daily.        Marland Kitchen aspirin 81 MG tablet Take by mouth daily.        . carvedilol (COREG) 12.5 MG tablet Take 12.5 mg by mouth daily. TAKES HALF TAB IN AM, HALF TAB IN PM      . carvedilol (COREG) 12.5 MG tablet TAKE ONE TABLET BY MOUTH TWICE DAILY  60 tablet  3  . clonazePAM (KLONOPIN) 0.5 MG tablet Take 0.5 mg by mouth 4 (four) times daily as needed. TAKE 2 TABS IN AM, TWO TABS AT LUNCH AND ONE TAB AT NIGHT      . clopidogrel (PLAVIX) 75 MG tablet TAKE ONE TABLET BY MOUTH ONCE DAILY  30 tablet  0  . fish oil-omega-3 fatty acids 1000 MG capsule Take 2 g by mouth daily.        Marland Kitchen FLUoxetine (PROZAC) 20 MG capsule Take 40 mg by mouth daily.        . furosemide (LASIX) 40 MG tablet Take 20 mg by mouth daily.       . Multiple  Vitamins-Minerals (CENTRUM SILVER PO) Take 1 tablet by mouth daily.      Marland Kitchen NEXIUM 40 MG capsule       . NITROSTAT 0.4 MG SL tablet DISSOLVE ONE TABLET UNDER THE TONGUE EVERY 5 MINUTES AS NEEDED FOR CHEST PAIN.  DO NOT EXCEED A TOTAL OF 3 DOSES IN 15 MINUTES  25 each  1  . simvastatin (ZOCOR) 40 MG tablet Take 20 mg by mouth at bedtime.       . TraMADol HCl 50 MG TBDP Take 50 mg by mouth 3 (three) times daily.        Marland Kitchen lisinopril (PRINIVIL,ZESTRIL) 20 MG tablet Take 20 mg by mouth daily. TAKE HALF TABLET IN AM, HALF TABLET AT NIGHT       No current facility-administered medications for this visit.    Past Medical History  Diagnosis Date  . Arteriosclerotic cardiovascular disease (ASCVD)     06/2008: DES to the RCA and LAD  . Hyperlipidemia   . Tobacco abuse   . Chronic anticoagulation   . Deep vein thrombosis   . Degenerative joint disease   . Carpal tunnel syndrome   . Anxiety and  depression   . Gastroesophageal reflux disease   . Hypertension     Past Surgical History  Procedure Laterality Date  . Inguinal hernia repair    . Rotator cuff repair    . Orif-unknown fracture    . Colonoscopy  ?  Date  . Total hip arthroplasty  2004    Left    ROS: Review of systems complete and found to be negative unless listed above  PHYSICAL EXAM BP 147/84  Pulse 65  Ht 6\' 1"  (1.854 m)  Wt 209 lb (94.802 kg)  BMI 27.58 kg/m2  General: Well developed, well nourished, in no acute distress Head: Eyes PERRLA, No xanthomas.   Normal cephalic and atramatic  Lungs: Inspiratory wheezes, with bibasilar crackles, cleared cough.  Heart: HRRR S1 S2, without MRG.  Pulses are 2+ & equal.            No carotid bruit. No JVD.  No abdominal bruits. No femoral bruits. Abdomen: Bowel sounds are positive, abdomen soft, distended and non-tender without masses or                  Hernia's noted. Msk:  Back normal, normal gait. Normal strength and tone for age. Extremities: No clubbing, cyanosis 1+  pitting edema L>R..  DP +1 Neuro: Alert and oriented X 3. Psych:  Good affect, responds appropriately    ASSESSMENT AND PLAN

## 2012-11-01 NOTE — Assessment & Plan Note (Signed)
He is followed by Dr. Ouida Sills for labs. He was recently seen by Dr. Ouida Sills yesterday. Adjustment of atorvastatin per PCP.

## 2012-11-28 ENCOUNTER — Other Ambulatory Visit: Payer: Self-pay | Admitting: Adult Health

## 2013-02-13 ENCOUNTER — Other Ambulatory Visit (HOSPITAL_COMMUNITY): Payer: Self-pay | Admitting: Internal Medicine

## 2013-02-13 ENCOUNTER — Ambulatory Visit (HOSPITAL_COMMUNITY)
Admission: RE | Admit: 2013-02-13 | Discharge: 2013-02-13 | Disposition: A | Discharge status: Home / Self Care | Payer: Medicare Other | Source: Ambulatory Visit | Attending: Internal Medicine | Admitting: Internal Medicine

## 2013-02-13 DIAGNOSIS — M7989 Other specified soft tissue disorders: Secondary | ICD-10-CM | POA: Insufficient documentation

## 2013-02-13 DIAGNOSIS — L97809 Non-pressure chronic ulcer of other part of unspecified lower leg with unspecified severity: Secondary | ICD-10-CM | POA: Insufficient documentation

## 2013-02-13 DIAGNOSIS — L97909 Non-pressure chronic ulcer of unspecified part of unspecified lower leg with unspecified severity: Secondary | ICD-10-CM

## 2013-02-13 DIAGNOSIS — M7730 Calcaneal spur, unspecified foot: Secondary | ICD-10-CM | POA: Insufficient documentation

## 2013-04-29 NOTE — Progress Notes (Signed)
HPI: Mr. Greenleaf is a 73 year-old former patient of Dr. Lattie Haw we are seeing for ongoing assessment of treatment of CAD with history of hypertension, hyperlipidemia, ongoing tobacco abuse. He will be reassigned to Dr. Rockney Ghee office.. The patient was having issues with fluid retention, with most recent echocardiogram demonstrating an EF of 60-65% 2014 with small region of mild basilar hypokinesis. LVH was noted. The patient was counseled on last visit on low sodium diet.  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 The patient unfortunately continues to smoke, and drinks a fifth and a half of whiskey daily. He also unfortunately continues to salty foods.    He is currently being treated for a pretibial wound on the left and has been at the wound clinic. Has stopped going but is wearing support dose and treating with over-the-counter antibiotics. As far as I can see in his past medical records he has not had ABIs completed but has had venous take place in the past and had been ruled out for DVT. He offers no complaints of intermittent claudication symptoms.    No Known Allergies  Current Outpatient Prescriptions  Medication Sig Dispense Refill  . Ascorbic Acid (VITAMIN C) 1000 MG tablet Take 1,000 mg by mouth daily.        Marland Kitchen aspirin 81 MG tablet Take by mouth daily.        . carvedilol (COREG) 12.5 MG tablet TAKE ONE TABLET BY MOUTH TWICE DAILY  60 tablet  6  . clonazePAM (KLONOPIN) 0.5 MG tablet Take 0.5 mg by mouth 4 (four) times daily as needed. TAKE 2 TABS IN AM, TWO TABS AT LUNCH AND ONE TAB AT NIGHT      . clopidogrel (PLAVIX) 75 MG tablet Take 1 tablet (75 mg total) by mouth once.  30 tablet  6  . fish oil-omega-3 fatty acids 1000 MG capsule Take 2 g by mouth daily.        Marland Kitchen FLUoxetine (PROZAC) 20 MG capsule Take 40 mg  by mouth daily.        . furosemide (LASIX) 40 MG tablet Take 20 mg by mouth daily.       Marland Kitchen lisinopril (PRINIVIL,ZESTRIL) 20 MG tablet Take 1 tablet (20 mg total) by mouth daily. TAKE HALF TABLET IN AM, HALF TABLET AT NIGHT  90 tablet  3  . Multiple Vitamins-Minerals (CENTRUM SILVER PO) Take 1 tablet by mouth daily.      Marland Kitchen NEXIUM 40 MG capsule       . NITROSTAT 0.4 MG SL tablet DISSOLVE ONE TABLET UNDER THE TONGUE EVERY 5 MINUTES AS NEEDED FOR CHEST PAIN.  DO NOT EXCEED A TOTAL OF 3 DOSES IN 15 MINUTES  25 each  1  . simvastatin (ZOCOR) 40 MG tablet Take 20 mg by mouth at bedtime.       . TraMADol HCl 50 MG TBDP Take 50 mg by mouth 3 (three) times daily.         No current facility-administered medications for this visit.    Past Medical History  Diagnosis Date  . Arteriosclerotic cardiovascular disease (ASCVD)     06/2008: DES to the RCA and LAD  . Hyperlipidemia   . Tobacco abuse   . Chronic anticoagulation   . Deep vein thrombosis   . Degenerative joint disease   . Carpal  tunnel syndrome   . Anxiety and depression   . Gastroesophageal reflux disease   . Hypertension     Past Surgical History  Procedure Laterality Date  . Inguinal hernia repair    . Rotator cuff repair    . Orif-unknown fracture    . Colonoscopy  ?  Date  . Total hip arthroplasty  2004    Left    ROS: Review of systems complete and found to be negative unless listed above  PHYSICAL EXAM BP 138/71  Pulse 55  Ht 6' (1.829 m)  Wt 208 lb (94.348 kg)  BMI 28.20 kg/m2 General: Well developed, well nourished, in no acute distress Head: Eyes PERRLA, No xanthomas.   Normal cephalic and atramatic  Lungs: Clear bilaterally to auscultation and percussion. Heart: HRRR S1 S2, bradycardia without MRG.  Pulses are 2+ & equal.            No carotid bruit. No JVD.  No abdominal bruits. No femoral bruits. Abdomen: Bowel sounds are positive, abdomen soft and non-tender without masses or                  Hernia's  noted. Obese but nondistended Msk:  Back normal, limping gait on the left. Normal strength and tone for age. Extremities: No clubbing, cyanosis or edema.  DP +1 Neuro: Alert and oriented X 3. Hard of hearing Psych:  Good affect, responds appropriately  EKG:  Sinus bradycardia with sinus arrhythmia, PACs, rate of 53 beats per minute.  ASSESSMENT AND PLAN

## 2013-04-30 ENCOUNTER — Ambulatory Visit (INDEPENDENT_AMBULATORY_CARE_PROVIDER_SITE_OTHER): Payer: Medicare Other | Admitting: Adult Health

## 2013-04-30 ENCOUNTER — Encounter: Payer: Self-pay | Admitting: Adult Health

## 2013-04-30 ENCOUNTER — Encounter: Payer: Self-pay | Admitting: *Deleted

## 2013-04-30 VITALS — BP 138/71 | HR 55 | Ht 72.0 in | Wt 208.0 lb

## 2013-04-30 DIAGNOSIS — I10 Essential (primary) hypertension: Secondary | ICD-10-CM

## 2013-04-30 DIAGNOSIS — Z720 Tobacco use: Secondary | ICD-10-CM

## 2013-04-30 DIAGNOSIS — F17200 Nicotine dependence, unspecified, uncomplicated: Secondary | ICD-10-CM

## 2013-04-30 DIAGNOSIS — I7090 Unspecified atherosclerosis: Secondary | ICD-10-CM

## 2013-04-30 DIAGNOSIS — E785 Hyperlipidemia, unspecified: Secondary | ICD-10-CM

## 2013-04-30 DIAGNOSIS — I2510 Atherosclerotic heart disease of native coronary artery without angina pectoris: Secondary | ICD-10-CM

## 2013-04-30 MED ORDER — LISINOPRIL 20 MG PO TABS: 20.0000 mg | ORAL_TABLET | Freq: Every day | ORAL | Status: DC

## 2013-04-30 NOTE — Patient Instructions (Addendum)
Your physician recommends that you schedule a follow-up appointment in: 6 months with Dr Virgina Jock will receive a reminder letter two months in advance reminding you to call and schedule your appointment. If you don't receive this letter, please contact our office.  Your physician recommends that you continue on your current medications as directed. Please refer to the Current Medication list given to you today.   Your physician has requested that you have en exercise stress myoview. For further information please visit [email or web address]. Please follow instruction sheet, as given.

## 2013-04-30 NOTE — Assessment & Plan Note (Signed)
There are significant concerns her cardiac status with ongoing tobacco abuse EtOH abuse and noncompliance issues. He is becoming more fatigued but tries to remain active. His had stents to his hospital left anterior descending artery which was completed in 2010. With no followup stress test completed since that time. With ongoing risk factors which have not been modified, Howard P. stress Myoview to evaluate for worsening ischemia are new areas of concern. He has been advised in the clinic to hold his carvedilol the night before and the morning of his stress test. Otherwise he is systolic smoking and drinking alcohol.  Most recent echocardiogram was completed in 2014 revealing a normal ejection fraction of 6065% with a small region of mild bibasilar hypokinesis. He does have some mild LVH noted present study. We will rereview his EF based on nuclear medicine study. He will continue carvedilol, aspirin, Lasix, lisinopril and simvastatin.

## 2013-04-30 NOTE — Assessment & Plan Note (Signed)
Labs are followed by Dr. Willey Blade to his office. He will continue to have this done per Dr. Willey Blade timing.

## 2013-04-30 NOTE — Assessment & Plan Note (Signed)
He has been counseled yet again on his smoking. This is not a lifestyle change is willing to make at this time. I discussed with him progression of CAD as a result along with lung disease. He verbalizes understanding

## 2013-04-30 NOTE — Assessment & Plan Note (Signed)
Pressure is currently well controlled. Will not make any changes in his medication regimen.

## 2013-04-30 NOTE — Progress Notes (Deleted)
Name: Alex Lara    DOB: February 20, 1940  Age: 73 y.o.  MR#: [id number]       PCP:  Asencion Noble, MD      Insurance: Payor: [employer] / Plan: BLUE MEDICARE / Product Type: *No Product type* /   CC:    Chief Complaint  Patient presents with  . Coronary Artery Disease    VS Filed Vitals:   04/30/13 1308  BP: 138/71  Pulse: 55  Height: 6' (1.829 m)  Weight: 208 lb (94.348 kg)    Weights Current Weight  04/30/13 208 lb (94.348 kg)  11/01/12 209 lb (94.802 kg)  03/29/12 208 lb 4 oz (94.462 kg)    Blood Pressure  BP Readings from Last 3 Encounters:  04/30/13 138/71  11/01/12 147/84  03/29/12 115/70     Admit date:  (Not on file) Last encounter with RMR:  11/28/2012   Allergy Review of patient's allergies indicates no known allergies.  Current Outpatient Prescriptions  Medication Sig Dispense Refill  . Ascorbic Acid (VITAMIN C) 1000 MG tablet Take 1,000 mg by mouth daily.        Marland Kitchen aspirin 81 MG tablet Take by mouth daily.        . carvedilol (COREG) 12.5 MG tablet TAKE ONE TABLET BY MOUTH TWICE DAILY  60 tablet  6  . clonazePAM (KLONOPIN) 0.5 MG tablet Take 0.5 mg by mouth 4 (four) times daily as needed. TAKE 2 TABS IN AM, TWO TABS AT LUNCH AND ONE TAB AT NIGHT      . clopidogrel (PLAVIX) 75 MG tablet Take 1 tablet (75 mg total) by mouth once.  30 tablet  6  . fish oil-omega-3 fatty acids 1000 MG capsule Take 2 g by mouth daily.        Marland Kitchen FLUoxetine (PROZAC) 20 MG capsule Take 40 mg by mouth daily.        . furosemide (LASIX) 40 MG tablet Take 20 mg by mouth daily.       Marland Kitchen lisinopril (PRINIVIL,ZESTRIL) 20 MG tablet Take 1 tablet (20 mg total) by mouth daily. TAKE HALF TABLET IN AM, HALF TABLET AT NIGHT  90 tablet  3  . Multiple Vitamins-Minerals (CENTRUM SILVER PO) Take 1 tablet by mouth daily.      Marland Kitchen NEXIUM 40 MG capsule       . NITROSTAT 0.4 MG SL tablet DISSOLVE ONE TABLET UNDER THE TONGUE EVERY 5 MINUTES AS NEEDED FOR CHEST PAIN.  DO NOT EXCEED A  TOTAL OF 3 DOSES IN 15 MINUTES  25 each  1  . simvastatin (ZOCOR) 40 MG tablet Take 20 mg by mouth at bedtime.       . TraMADol HCl 50 MG TBDP Take 50 mg by mouth 3 (three) times daily.         No current facility-administered medications for this visit.    Discontinued Meds:    Medications Discontinued During This Encounter  Medication Reason  . carvedilol (COREG) 12.5 MG tablet Error  . lisinopril (PRINIVIL,ZESTRIL) 20 MG tablet Reorder    Patient Active Problem List   Diagnosis Date Noted  . Arteriosclerotic cardiovascular disease (ASCVD)   . Hyperlipidemia   . Tobacco abuse   . Deep vein thrombosis   . Gastroesophageal reflux disease   . Hypertension   . OSTEOARTHRITIS 02/14/2006    LABS    Component Value Date/Time   NA 137 03/29/2012 1713   NA 137 01/12/2011 1505  NA 139 06/18/2008 0144   K 4.0 03/29/2012 1713   K 4.7 01/12/2011 1505   K 4.3 06/18/2008 0144   CL 104 03/29/2012 1713   CL 102 01/12/2011 1505   CL 103 06/18/2008 0144   CO2 25 03/29/2012 1713   CO2 22 01/12/2011 1505   CO2 23 06/18/2008 0144   GLUCOSE 82 03/29/2012 1713   GLUCOSE 105* 01/12/2011 1505   GLUCOSE 103* 06/18/2008 0144   BUN 32* 03/29/2012 1713   BUN 17 01/12/2011 1505   BUN 22 06/18/2008 0144   CREATININE 1.31 03/29/2012 1713   CREATININE 1.32 01/12/2011 1505   CREATININE 1.17 06/18/2008 0144   CREATININE 0.88 06/13/2008 0555   CREATININE 0.95 06/12/2008 0603   CALCIUM 9.2 03/29/2012 1713   CALCIUM 9.1 01/12/2011 1505   CALCIUM 8.6 06/18/2008 0144   GFRNONAA >60 06/13/2008 0555   GFRNONAA >60 06/12/2008 0603   GFRNONAA >60 08/08/2007 1657   GFRAA  Value: >60        The eGFR has been calculated using the MDRD equation. This calculation has not been validated in all clinical situations. eGFR's persistently <60 mL/min signify possible Chronic Kidney Disease. 06/13/2008 0555   GFRAA  Value: >60        The eGFR has been calculated using the MDRD equation. This calculation has not been validated in all clinical situations. eGFR's  persistently <60 mL/min signify possible Chronic Kidney Disease. 06/12/2008 0603   GFRAA  Value: >60        The eGFR has been calculated using the MDRD equation. This calculation has not been validated in all clinical 08/08/2007 1657   CMP     Component Value Date/Time   NA 137 03/29/2012 1713   K 4.0 03/29/2012 1713   CL 104 03/29/2012 1713   CO2 25 03/29/2012 1713   GLUCOSE 82 03/29/2012 1713   BUN 32* 03/29/2012 1713   CREATININE 1.31 03/29/2012 1713   CREATININE 1.17 06/18/2008 0144   CALCIUM 9.2 03/29/2012 1713   PROT 6.8 01/12/2011 1505   ALBUMIN 4.1 01/12/2011 1505   AST 32 01/12/2011 1505   ALT 23 01/12/2011 1505   ALKPHOS 78 01/12/2011 1505   BILITOT 0.5 01/12/2011 1505   GFRNONAA >60 06/13/2008 0555   GFRAA  Value: >60        The eGFR has been calculated using the MDRD equation. This calculation has not been validated in all clinical situations. eGFR's persistently <60 mL/min signify possible Chronic Kidney Disease. 06/13/2008 0555       Component Value Date/Time   WBC 4.1 01/12/2011 1505   WBC 5.3 10/15/2008 0000   WBC 6.2 06/13/2008 0550   HGB 13.4 01/12/2011 1505   HGB 13.7 10/15/2008 0000   HGB 14.4 06/13/2008 0550   HCT 40.4 01/12/2011 1505   HCT 42.1 10/15/2008 0000   HCT 41.7 06/13/2008 0550   MCV 93.1 01/12/2011 1505   MCV 96.1 10/15/2008 0000   MCV 94.1 06/13/2008 0550    Lipid Panel     Component Value Date/Time   CHOL 156 01/12/2011 1505   TRIG 233* 01/12/2011 1505   HDL 25* 01/12/2011 1505   CHOLHDL 6.2 01/12/2011 1505   VLDL 47* 01/12/2011 1505   LDLCALC 84 01/12/2011 1505    ABG No results found for this basename: phart, pco2, pco2art, po2, po2art, hco3, tco2, acidbasedef, o2sat     No results found for this basename: TSH   BNP (last 3 results) No results found for this basename:  PROBNP,  in the last 8760 hours Cardiac Panel (last 3 results) No results found for this basename: CKTOTAL, CKMB, TROPONINI, RELINDX,  in the last 72 hours  Iron/TIBC/Ferritin No results found for this basename:  iron, tibc, ferritin     EKG Orders placed in visit on 04/30/13  . EKG 12-LEAD     Prior Assessment and Plan Problem List as of 04/30/2013     Cardiovascular and Mediastinum   Arteriosclerotic cardiovascular disease (ASCVD)   Last Assessment & Plan   11/01/2012 Office Visit Written 11/01/2012  1:32 PM by Lendon Colonel, NP     He is without cardiac complaints. He continues to smoke and eat unhealthy foods which I have again re-educated him about. He does not appear inclined to want to make changes, despite my warnings to the contrary. We will continue to do our best with risk management strategies. He will continue on ASA,Coreg and statin.    Deep vein thrombosis   Last Assessment & Plan   11/01/2012 Office Visit Written 11/01/2012  1:36 PM by Lendon Colonel, NP     Continues on Plavix with chronic LLE edema.     Hypertension   Last Assessment & Plan   11/01/2012 Office Visit Written 11/01/2012  1:35 PM by Lendon Colonel, NP     Currently moderately controlled. He is on lasix with lisinopril and coreg. He continues to eat salty foods despite education. He has decreased his lasix per Dr. Willey Blade due to urinary frequency at night. He states that he is also being treated for prostatitis.  Continue to monitor his status. No changes in medications at this time.      Digestive   Gastroesophageal reflux disease     Musculoskeletal and Integument   OSTEOARTHRITIS     Other   Hyperlipidemia   Last Assessment & Plan   11/01/2012 Office Visit Written 11/01/2012  1:33 PM by Lendon Colonel, NP     He is followed by Dr. Willey Blade for labs. He was recently seen by Dr. Willey Blade yesterday. Adjustment of atorvastatin per PCP.    Tobacco abuse   Last Assessment & Plan   11/01/2012 Office Visit Written 11/01/2012  1:36 PM by Lendon Colonel, NP     Now between 1-1 1/2 ppd. He states he "burns more that he smokes." He does not want to entertain quitting at this time despite his CVRF  and CAD.        Imaging: No results found.

## 2013-06-26 ENCOUNTER — Encounter (HOSPITAL_BASED_OUTPATIENT_CLINIC_OR_DEPARTMENT_OTHER): Payer: Medicare Other | Attending: General Surgery

## 2013-10-15 ENCOUNTER — Other Ambulatory Visit: Payer: Self-pay | Admitting: Adult Health

## 2013-11-05 ENCOUNTER — Emergency Department (HOSPITAL_COMMUNITY)
Admission: EM | Admit: 2013-11-05 | Discharge: 2013-11-05 | Disposition: A | Discharge status: Home / Self Care | Payer: Medicare Other | Attending: Emergency Medicine | Admitting: Emergency Medicine

## 2013-11-05 ENCOUNTER — Emergency Department (HOSPITAL_COMMUNITY): Payer: Medicare Other

## 2013-11-05 ENCOUNTER — Encounter (HOSPITAL_COMMUNITY): Payer: Self-pay | Admitting: Emergency Medicine

## 2013-11-05 DIAGNOSIS — M1990 Unspecified osteoarthritis, unspecified site: Secondary | ICD-10-CM | POA: Insufficient documentation

## 2013-11-05 DIAGNOSIS — I2510 Atherosclerotic heart disease of native coronary artery without angina pectoris: Secondary | ICD-10-CM | POA: Insufficient documentation

## 2013-11-05 DIAGNOSIS — Z79899 Other long term (current) drug therapy: Secondary | ICD-10-CM | POA: Diagnosis not present

## 2013-11-05 DIAGNOSIS — R4182 Altered mental status, unspecified: Secondary | ICD-10-CM | POA: Diagnosis present

## 2013-11-05 DIAGNOSIS — Z7982 Long term (current) use of aspirin: Secondary | ICD-10-CM | POA: Insufficient documentation

## 2013-11-05 DIAGNOSIS — I10 Essential (primary) hypertension: Secondary | ICD-10-CM | POA: Diagnosis not present

## 2013-11-05 DIAGNOSIS — K219 Gastro-esophageal reflux disease without esophagitis: Secondary | ICD-10-CM | POA: Diagnosis not present

## 2013-11-05 DIAGNOSIS — F329 Major depressive disorder, single episode, unspecified: Secondary | ICD-10-CM | POA: Insufficient documentation

## 2013-11-05 DIAGNOSIS — Z8669 Personal history of other diseases of the nervous system and sense organs: Secondary | ICD-10-CM | POA: Diagnosis not present

## 2013-11-05 DIAGNOSIS — Z7902 Long term (current) use of antithrombotics/antiplatelets: Secondary | ICD-10-CM | POA: Diagnosis not present

## 2013-11-05 DIAGNOSIS — F419 Anxiety disorder, unspecified: Secondary | ICD-10-CM

## 2013-11-05 DIAGNOSIS — Z720 Tobacco use: Secondary | ICD-10-CM | POA: Diagnosis not present

## 2013-11-05 DIAGNOSIS — Z86718 Personal history of other venous thrombosis and embolism: Secondary | ICD-10-CM | POA: Diagnosis not present

## 2013-11-05 DIAGNOSIS — E785 Hyperlipidemia, unspecified: Secondary | ICD-10-CM | POA: Diagnosis not present

## 2013-11-05 LAB — URINALYSIS, ROUTINE W REFLEX MICROSCOPIC
Bilirubin Urine: NEGATIVE
GLUCOSE, UA: NEGATIVE mg/dL
HGB URINE DIPSTICK: NEGATIVE
Ketones, ur: NEGATIVE mg/dL
LEUKOCYTES UA: NEGATIVE
Nitrite: NEGATIVE
PH: 6 (ref 5.0–8.0)
Protein, ur: NEGATIVE mg/dL
SPECIFIC GRAVITY, URINE: 1.013 (ref 1.005–1.030)
Urobilinogen, UA: 0.2 mg/dL (ref 0.0–1.0)

## 2013-11-05 LAB — CBC
HEMATOCRIT: 41.7 % (ref 39.0–52.0)
Hemoglobin: 14.4 g/dL (ref 13.0–17.0)
MCH: 33.6 pg (ref 26.0–34.0)
MCHC: 34.5 g/dL (ref 30.0–36.0)
MCV: 97.2 fL (ref 78.0–100.0)
Platelets: 163 10*3/uL (ref 150–400)
RBC: 4.29 MIL/uL (ref 4.22–5.81)
RDW: 13.7 % (ref 11.5–15.5)
WBC: 4.3 10*3/uL (ref 4.0–10.5)

## 2013-11-05 LAB — COMPREHENSIVE METABOLIC PANEL
ALBUMIN: 3.9 g/dL (ref 3.5–5.2)
ALK PHOS: 86 U/L (ref 39–117)
ALT: 37 U/L (ref 0–53)
ANION GAP: 13 (ref 5–15)
AST: 39 U/L — AB (ref 0–37)
BILIRUBIN TOTAL: 0.3 mg/dL (ref 0.3–1.2)
BUN: 16 mg/dL (ref 6–23)
CHLORIDE: 102 meq/L (ref 96–112)
CO2: 24 mEq/L (ref 19–32)
Calcium: 9.2 mg/dL (ref 8.4–10.5)
Creatinine, Ser: 1.37 mg/dL — ABNORMAL HIGH (ref 0.50–1.35)
GFR calc Af Amer: 58 mL/min — ABNORMAL LOW (ref >90)
GFR calc non Af Amer: 50 mL/min — ABNORMAL LOW (ref >90)
Glucose, Bld: 94 mg/dL (ref 70–99)
POTASSIUM: 4.4 meq/L (ref 3.7–5.3)
SODIUM: 139 meq/L (ref 137–147)
Total Protein: 7.5 g/dL (ref 6.0–8.3)

## 2013-11-05 LAB — PROTIME-INR
INR: 1 (ref 0.00–1.49)
Prothrombin Time: 13.3 seconds (ref 11.6–15.2)

## 2013-11-05 NOTE — Discharge Instructions (Signed)
Generalized Anxiety Disorder Generalized anxiety disorder (GAD) is a mental disorder. It interferes with life functions, including relationships, work, and school. GAD is different from normal anxiety, which everyone experiences at some point in their lives in response to specific life events and activities. Normal anxiety actually helps us prepare for and get through these life events and activities. Normal anxiety goes away after the event or activity is over.  GAD causes anxiety that is not necessarily related to specific events or activities. It also causes excess anxiety in proportion to specific events or activities. The anxiety associated with GAD is also difficult to control. GAD can vary from mild to severe. People with severe GAD can have intense waves of anxiety with physical symptoms (panic attacks).  SYMPTOMS The anxiety and worry associated with GAD are difficult to control. This anxiety and worry are related to many life events and activities and also occur more days than not for 6 months or longer. People with GAD also have three or more of the following symptoms (one or more in children):  Restlessness.   Fatigue.  Difficulty concentrating.   Irritability.  Muscle tension.  Difficulty sleeping or unsatisfying sleep. DIAGNOSIS GAD is diagnosed through an assessment by your health care provider. Your health care provider will ask you questions aboutyour mood,physical symptoms, and events in your life. Your health care provider may ask you about your medical history and use of alcohol or drugs, including prescription medicines. Your health care provider may also do a physical exam and blood tests. Certain medical conditions and the use of certain substances can cause symptoms similar to those associated with GAD. Your health care provider may refer you to a mental health specialist for further evaluation. TREATMENT The following therapies are usually used to treat GAD:    Medication. Antidepressant medication usually is prescribed for long-term daily control. Antianxiety medicines may be added in severe cases, especially when panic attacks occur.   Talk therapy (psychotherapy). Certain types of talk therapy can be helpful in treating GAD by providing support, education, and guidance. A form of talk therapy called cognitive behavioral therapy can teach you healthy ways to think about and react to daily life events and activities.  Stress managementtechniques. These include yoga, meditation, and exercise and can be very helpful when they are practiced regularly. A mental health specialist can help determine which treatment is best for you. Some people see improvement with one therapy. However, other people require a combination of therapies. Document Released: 04/23/2012 Document Revised: 05/13/2013 Document Reviewed: 04/23/2012 [employer] Patient Information 2015 [employer]. This information is not intended to replace advice given to you by your health care provider. Make sure you discuss any questions you have with your health care provider.  

## 2013-11-05 NOTE — ED Notes (Signed)
Pt. Reports alerted mental status, per wife has slowly been progressing x 1 year. Pt. States "I don't have control over myself". Also reports increased urination and intermittent lower abdominal pain that radiates to right flank.

## 2013-11-05 NOTE — ED Notes (Signed)
Per Daughter: Pt has had an increasing ETOH consumption for past several months. Pt is noncompliant with medication/ETOH cessation. Pt admits to increasing depression x2 months. Denies any HI/SI. No plan or means at this time. "I just don't feel right in the head."

## 2013-11-05 NOTE — ED Provider Notes (Signed)
CSN: [id number]     Arrival date & time 11/05/13  1243 History   First MD Initiated Contact with Patient 11/05/13 1757     Chief Complaint  Patient presents with  . Altered Mental Status     (Consider location/radiation/quality/duration/timing/severity/associated sxs/prior Treatment) HPI Comments: Patient presents to the emergency department with chief complaint of reported altered mental status. He is accompanied by family members, state that he has become forgetful, forgetting what the week it is, as well as forgetting what activities he is currently involved in. The patient's wife states that the symptoms and worsening over the past year, but have been much worse this week. Patient denies any headaches, weakness, numbness. Denies any slurred speech. Denies any history of stroke. He denies any associated chest pain, abdominal pain, nausea, vomiting. States that he does have intermittent shortness of breath, but has not noticed any recently. He has a history of CAD and mild CHF, for which he is taking Lasix. Additionally, patient states that he feels anxious and depressed frequently. He denies any SI or HI. He states that his anxiety is starting to control his life.  The history is provided by the patient. No language interpreter was used.    Past Medical History  Diagnosis Date  . Arteriosclerotic cardiovascular disease (ASCVD)     06/2008: DES to the RCA and LAD  . Hyperlipidemia   . Tobacco abuse   . Chronic anticoagulation   . Deep vein thrombosis   . Degenerative joint disease   . Carpal tunnel syndrome   . Anxiety and depression   . Gastroesophageal reflux disease   . Hypertension    Past Surgical History  Procedure Laterality Date  . Inguinal hernia repair    . Rotator cuff repair    . Orif-unknown fracture    . Colonoscopy  ?  Date  . Total hip arthroplasty  2004    Left   Family History  Problem Relation Age of Onset  . Coronary artery disease Father   . Coronary  artery disease Mother     died post-CABG   History  Substance Use Topics  . Smoking status: Current Every Day Smoker -- 1.00 packs/day for 55 years  . Smokeless tobacco: Not on file  . Alcohol Use: Yes     Comment: 1.5 bottles daily of whiskey - 2 bottles of wine daily    Review of Systems  Constitutional: Negative for fever and chills.  Respiratory: Negative for shortness of breath.   Cardiovascular: Negative for chest pain.  Gastrointestinal: Negative for nausea, vomiting, diarrhea and constipation.  Genitourinary: Negative for dysuria.  Psychiatric/Behavioral: Positive for confusion and agitation. The patient is nervous/anxious.   All other systems reviewed and are negative.     Allergies  Codeine  Home Medications   Prior to Admission medications   Medication Sig Start Date End Date Taking? Authorizing Provider  Ascorbic Acid (VITAMIN C) 1000 MG tablet Take 1,000 mg by mouth daily.      Historical Provider, MD  aspirin 81 MG tablet Take by mouth daily.      Historical Provider, MD  carvedilol (COREG) 12.5 MG tablet TAKE ONE TABLET BY MOUTH TWICE DAILY 11/28/12   Lendon Colonel, NP  clonazePAM (KLONOPIN) 0.5 MG tablet Take 0.5 mg by mouth 4 (four) times daily as needed. TAKE 2 TABS IN AM, TWO TABS AT LUNCH AND ONE TAB AT NIGHT    Historical Provider, MD  clopidogrel (PLAVIX) 75 MG tablet TAKE ONE TABLET  BY MOUTH ONCE DAILY 10/15/13   Lendon Colonel, NP  fish oil-omega-3 fatty acids 1000 MG capsule Take 2 g by mouth daily.      Historical Provider, MD  FLUoxetine (PROZAC) 20 MG capsule Take 40 mg by mouth daily.      Historical Provider, MD  furosemide (LASIX) 40 MG tablet Take 20 mg by mouth daily.     Historical Provider, MD  lisinopril (PRINIVIL,ZESTRIL) 20 MG tablet Take 1 tablet (20 mg total) by mouth daily. TAKE HALF TABLET IN AM, HALF TABLET AT NIGHT 04/30/13 07/16/14  Lendon Colonel, NP  Multiple Vitamins-Minerals (CENTRUM SILVER PO) Take 1 tablet by mouth  daily.    Historical Provider, MD  NEXIUM 40 MG capsule  03/01/12   Historical Provider, MD  NITROSTAT 0.4 MG SL tablet DISSOLVE ONE TABLET UNDER THE TONGUE EVERY 5 MINUTES AS NEEDED FOR CHEST PAIN.  DO NOT EXCEED A TOTAL OF 3 DOSES IN 15 MINUTES 07/27/11   Yehuda Savannah, MD  simvastatin (ZOCOR) 40 MG tablet Take 20 mg by mouth at bedtime.     Historical Provider, MD  TraMADol HCl 50 MG TBDP Take 50 mg by mouth 3 (three) times daily.      Historical Provider, MD   BP 147/80  Pulse 65  Temp(Src) 98.5 F (36.9 C) (Oral)  Resp 18  SpO2 99% Physical Exam  Nursing note and vitals reviewed. Constitutional: He is oriented to person, place, and time. He appears well-developed and well-nourished.  HENT:  Head: Normocephalic and atraumatic.  Eyes: Conjunctivae and EOM are normal. Pupils are equal, round, and reactive to light. Right eye exhibits no discharge. Left eye exhibits no discharge. No scleral icterus.  Neck: Normal range of motion. Neck supple. No JVD present.  Cardiovascular: Normal rate, regular rhythm and normal heart sounds.  Exam reveals no gallop and no friction rub.   No murmur heard. Pulmonary/Chest: Effort normal and breath sounds normal. No respiratory distress. He has no wheezes. He has no rales. He exhibits no tenderness.  Abdominal: Soft. He exhibits no distension and no mass. There is no tenderness. There is no rebound and no guarding.  Musculoskeletal: Normal range of motion. He exhibits no edema and no tenderness.  Neurological: He is alert and oriented to person, place, and time.  Skin: Skin is warm and dry.  Psychiatric: He has a normal mood and affect. His behavior is normal. Judgment and thought content normal.    ED Course  Procedures (including critical care time) Results for orders placed during the hospital encounter of 11/05/13  CBC      Result Value Ref Range   WBC 4.3  4.0 - 10.5 K/uL   RBC 4.29  4.22 - 5.81 MIL/uL   Hemoglobin 14.4  13.0 - 17.0 g/dL    HCT 41.7  39.0 - 52.0 %   MCV 97.2  78.0 - 100.0 fL   MCH 33.6  26.0 - 34.0 pg   MCHC 34.5  30.0 - 36.0 g/dL   RDW 13.7  11.5 - 15.5 %   Platelets 163  150 - 400 K/uL  COMPREHENSIVE METABOLIC PANEL      Result Value Ref Range   Sodium 139  137 - 147 mEq/L   Potassium 4.4  3.7 - 5.3 mEq/L   Chloride 102  96 - 112 mEq/L   CO2 24  19 - 32 mEq/L   Glucose, Bld 94  70 - 99 mg/dL   BUN 16  6 -  23 mg/dL   Creatinine, Ser 1.37 (*) 0.50 - 1.35 mg/dL   Calcium 9.2  8.4 - 10.5 mg/dL   Total Protein 7.5  6.0 - 8.3 g/dL   Albumin 3.9  3.5 - 5.2 g/dL   AST 39 (*) 0 - 37 U/L   ALT 37  0 - 53 U/L   Alkaline Phosphatase 86  39 - 117 U/L   Total Bilirubin 0.3  0.3 - 1.2 mg/dL   GFR calc non Af Amer 50 (*) >90 mL/min   GFR calc Af Amer 58 (*) >90 mL/min   Anion gap 13  5 - 15  PROTIME-INR      Result Value Ref Range   Prothrombin Time 13.3  11.6 - 15.2 seconds   INR 1.00  0.00 - 1.49  URINALYSIS, ROUTINE W REFLEX MICROSCOPIC      Result Value Ref Range   Color, Urine YELLOW  YELLOW   APPearance CLOUDY (*) CLEAR   Specific Gravity, Urine 1.013  1.005 - 1.030   pH 6.0  5.0 - 8.0   Glucose, UA NEGATIVE  NEGATIVE mg/dL   Hgb urine dipstick NEGATIVE  NEGATIVE   Bilirubin Urine NEGATIVE  NEGATIVE   Ketones, ur NEGATIVE  NEGATIVE mg/dL   Protein, ur NEGATIVE  NEGATIVE mg/dL   Urobilinogen, UA 0.2  0.0 - 1.0 mg/dL   Nitrite NEGATIVE  NEGATIVE   Leukocytes, UA NEGATIVE  NEGATIVE   Ct Head Wo Contrast  11/05/2013   CLINICAL DATA:  Increasing alcohol consumption for several months. Depression. Confusion.  EXAM: CT HEAD WITHOUT CONTRAST  TECHNIQUE: Contiguous axial images were obtained from the base of the skull through the vertex without intravenous contrast.  COMPARISON:  07/12/2011  FINDINGS: Prominence of the sulci and ventricles identified compatible with brain atrophy. The cerebral and cerebellar hemispheres are otherwise normal in attenuation and morphology. No acute cortical infarct,  hemorrhage, or mass lesion ispresent. No significant extra-axial fluid collection is present. The paranasal sinuses andmastoid air cells are clear. The osseous skull is intact.  IMPRESSION: 1. No acute intracranial abnormalities. 2. Mild brain atrophy.   Electronically Signed   By: Kerby Moors M.D.   On: 11/05/2013 18:42      EKG Interpretation None      MDM   Final diagnoses:  Altered mental status  Anxiety    Patient with complaint of becoming forgetful.  No true altered mental status.  Patient is appropriate.  He is alert and oriented.  He states that he thinks that most of his symptoms are related to anxiety.  He is currently being treated for anxiety and depression.  He denies any SI/HI.  Labs and workup today are reassuring.  Discussed the patient with Dr. Audie Pinto, who agrees that the patient can be discharged to home.  Offered TTS consult tonight for anxiety, but patient declined and will instead take outpatient resources.  He feels well and is stable and ready for discharge. He states that he would like to go home.    Montine Circle, PA-C 11/06/13 [phone]

## 2013-11-05 NOTE — ED Notes (Signed)
Patient transported to CT 

## 2013-11-07 NOTE — ED Provider Notes (Signed)
Medical screening examination/treatment/procedure(s) were performed by non-physician practitioner and as supervising physician I was immediately available for consultation/collaboration.   Dot Lanes, MD 11/07/13 [phone]

## 2013-11-25 NOTE — Progress Notes (Signed)
HPI: [name] is a 73 y/o patient yet to be established with Dr. Bronson Lara or Dr. Harl Lara we are following for ongoing assessment and management of CAD, Hypertension and Hyperlipidemia. He was last seen in April of 2015. He was stable from cardiac standpoint at that time and continued on his medical regimen. He is here for 6 month follow up.   He comes today without cardiac complaints but talks at length about drinking heavily and his depression. He was drinking up to 3 bottles of wine a day and continued to take his Klonopin, antidepressant and Plavix. He was seen in ER for AMS on 11/05/2013.Marland Kitchen He refused admission to [facility] for alcoholism. States he will beat it on his own.  Allergies  Allergen Reactions  . Codeine Nausea And Vomiting and Other (See Comments)    "go fuzzy"    Current Outpatient Prescriptions  Medication Sig Dispense Refill  . Ascorbic Acid (VITAMIN C) 1000 MG tablet Take 1,000 mg by mouth daily.      Marland Kitchen aspirin 81 MG tablet Take by mouth daily.      . clonazePAM (KLONOPIN) 0.5 MG tablet Take 0.5 mg by mouth 3 (three) times daily as needed for anxiety.     . clopidogrel (PLAVIX) 75 MG tablet Take 75 mg by mouth daily.   0  . FLUoxetine (PROZAC) 20 MG capsule Take 40 mg by mouth daily.      . furosemide (LASIX) 40 MG tablet Take 20 mg by mouth daily.     . Ibuprofen (ADVIL) 200 MG CAPS Take 600 mg by mouth 3 (three) times daily as needed (pain).    . Ibuprofen-Diphenhydramine HCl (ADVIL PM) 200-25 MG CAPS Take 1 tablet by mouth at bedtime as needed (sleep).    Marland Kitchen lisinopril (PRINIVIL,ZESTRIL) 20 MG tablet Take 20 mg by mouth daily.     . magnesium oxide (MAG-OX) 400 MG tablet Take 400 mg by mouth daily.    . Multiple Vitamins-Minerals (CENTRUM SILVER PO) Take 1 tablet by mouth daily.    Marland Kitchen NEXIUM 40 MG capsule Take 40 mg by mouth daily.     . simvastatin (ZOCOR) 40 MG tablet Take 20 mg by mouth at bedtime.     . carvedilol (COREG) 12.5 MG tablet Take 12.5 mg by  mouth 2 (two) times daily with a meal.    . fish oil-omega-3 fatty acids 1000 MG capsule Take 2 g by mouth daily.       No current facility-administered medications for this visit.    Past Medical History  Diagnosis Date  . Arteriosclerotic cardiovascular disease (ASCVD)     06/2008: DES to the RCA and LAD  . Hyperlipidemia   . Tobacco abuse   . Chronic anticoagulation   . Deep vein thrombosis   . Degenerative joint disease   . Carpal tunnel syndrome   . Anxiety and depression   . Gastroesophageal reflux disease   . Hypertension     Past Surgical History  Procedure Laterality Date  . Inguinal hernia repair    . Rotator cuff repair    . Orif-unknown fracture    . Colonoscopy  ?  Date  . Total hip arthroplasty  2004    Left    ROS: Review of systems complete and found to be negative unless listed above  PHYSICAL EXAM BP 128/82 mmHg  Pulse 69  Ht 6' (1.829 m)  Wt 210 lb (95.255 kg)  BMI 28.47 kg/m2 General: Well developed, well  nourished, in no acute distress Head: Eyes PERRLA, No xanthomas.   Normal cephalic and atramatic  Lungs: Clear bilaterally to auscultation and percussion. Heart: HRRR S1 S2, without MRG.  Pulses are 2+ & equal.            No carotid bruit. No JVD.  No abdominal bruits. No femoral bruits. Abdomen: Bowel sounds are positive, abdomen soft and non-tender without masses or                  Hernia's noted. Msk:  Back normal, normal gait. Normal strength and tone for age. Extremities: No clubbing, cyanosis or edema.  DP +1 Neuro: Alert and oriented X 3. Psych:  Good affect, responds appropriately, talks at length about ETOH abuse problem and family hx, suicide attempts.    ASSESSMENT AND PLAN

## 2013-11-26 ENCOUNTER — Encounter: Payer: Self-pay | Admitting: Adult Health

## 2013-11-26 ENCOUNTER — Ambulatory Visit (INDEPENDENT_AMBULATORY_CARE_PROVIDER_SITE_OTHER): Payer: Medicare Other | Admitting: Adult Health

## 2013-11-26 VITALS — BP 128/82 | HR 69 | Ht 72.0 in | Wt 210.0 lb

## 2013-11-26 DIAGNOSIS — Z720 Tobacco use: Secondary | ICD-10-CM

## 2013-11-26 DIAGNOSIS — F1010 Alcohol abuse, uncomplicated: Secondary | ICD-10-CM

## 2013-11-26 DIAGNOSIS — I2510 Atherosclerotic heart disease of native coronary artery without angina pectoris: Secondary | ICD-10-CM

## 2013-11-26 DIAGNOSIS — I10 Essential (primary) hypertension: Secondary | ICD-10-CM

## 2013-11-26 NOTE — Progress Notes (Deleted)
Name: Alex Lara    DOB: 03/08/1940  Age: 73 y.o.  MR#: [id number]       PCP:  Asencion Noble, MD      Insurance: Payor: [place] / Plan: BLUE MEDICARE / Product Type: *No Product type* /   CC:    Chief Complaint  Patient presents with  . Coronary Artery Disease  . Hypertension    VS Filed Vitals:   11/26/13 1500  BP: 128/82  Pulse: 69  Height: 6' (1.829 m)  Weight: 210 lb (95.255 kg)    Weights Current Weight  11/26/13 210 lb (95.255 kg)  04/30/13 208 lb (94.348 kg)  11/01/12 209 lb (94.802 kg)    Blood Pressure  BP Readings from Last 3 Encounters:  11/26/13 128/82  11/05/13 152/89  04/30/13 138/71     Admit date:  (Not on file) Last encounter with [facility]:  10/15/2013   Allergy Codeine  Current Outpatient Prescriptions  Medication Sig Dispense Refill  . Ascorbic Acid (VITAMIN C) 1000 MG tablet Take 1,000 mg by mouth daily.      Marland Kitchen aspirin 81 MG tablet Take by mouth daily.      . clonazePAM (KLONOPIN) 0.5 MG tablet Take 0.5 mg by mouth 3 (three) times daily as needed for anxiety.     . clopidogrel (PLAVIX) 75 MG tablet Take 75 mg by mouth daily.   0  . FLUoxetine (PROZAC) 20 MG capsule Take 40 mg by mouth daily.      . furosemide (LASIX) 40 MG tablet Take 20 mg by mouth daily.     . Ibuprofen (ADVIL) 200 MG CAPS Take 600 mg by mouth 3 (three) times daily as needed (pain).    . Ibuprofen-Diphenhydramine HCl (ADVIL PM) 200-25 MG CAPS Take 1 tablet by mouth at bedtime as needed (sleep).    Marland Kitchen lisinopril (PRINIVIL,ZESTRIL) 20 MG tablet Take 20 mg by mouth daily.     . magnesium oxide (MAG-OX) 400 MG tablet Take 400 mg by mouth daily.    . Multiple Vitamins-Minerals (CENTRUM SILVER PO) Take 1 tablet by mouth daily.    Marland Kitchen NEXIUM 40 MG capsule Take 40 mg by mouth daily.     . simvastatin (ZOCOR) 40 MG tablet Take 20 mg by mouth at bedtime.     . carvedilol (COREG) 12.5 MG tablet Take 12.5 mg by mouth 2 (two) times daily with a meal.    . fish  oil-omega-3 fatty acids 1000 MG capsule Take 2 g by mouth daily.       No current facility-administered medications for this visit.    Discontinued Meds:   There are no discontinued medications.  Patient Active Problem List   Diagnosis Date Noted  . Arteriosclerotic cardiovascular disease (ASCVD)   . Hyperlipidemia   . Tobacco abuse   . Deep vein thrombosis   . Gastroesophageal reflux disease   . Hypertension   . OSTEOARTHRITIS 02/14/2006    LABS    Component Value Date/Time   NA 139 11/05/2013 1321   NA 137 03/29/2012 1713   NA 137 01/12/2011 1505   K 4.4 11/05/2013 1321   K 4.0 03/29/2012 1713   K 4.7 01/12/2011 1505   CL 102 11/05/2013 1321   CL 104 03/29/2012 1713   CL 102 01/12/2011 1505   CO2 24 11/05/2013 1321   CO2 25 03/29/2012 1713   CO2 22 01/12/2011 1505   GLUCOSE 94 11/05/2013 1321   GLUCOSE 82 03/29/2012 1713  GLUCOSE 105* 01/12/2011 1505   BUN 16 11/05/2013 1321   BUN 32* 03/29/2012 1713   BUN 17 01/12/2011 1505   CREATININE 1.37* 11/05/2013 1321   CREATININE 1.31 03/29/2012 1713   CREATININE 1.32 01/12/2011 1505   CREATININE 1.17 06/18/2008 0144   CREATININE 0.88 06/13/2008 0555   CALCIUM 9.2 11/05/2013 1321   CALCIUM 9.2 03/29/2012 1713   CALCIUM 9.1 01/12/2011 1505   GFRNONAA 50* 11/05/2013 1321   GFRNONAA >60 06/13/2008 0555   GFRNONAA >60 06/12/2008 0603   GFRAA 58* 11/05/2013 1321   GFRAA  06/13/2008 0555    >60        The eGFR has been calculated using the MDRD equation. This calculation has not been validated in all clinical situations. eGFR's persistently <60 mL/min signify possible Chronic Kidney Disease.   GFRAA  06/12/2008 0603    >60        The eGFR has been calculated using the MDRD equation. This calculation has not been validated in all clinical situations. eGFR's persistently <60 mL/min signify possible Chronic Kidney Disease.   CMP     Component Value Date/Time   NA 139 11/05/2013 1321   K 4.4 11/05/2013  1321   CL 102 11/05/2013 1321   CO2 24 11/05/2013 1321   GLUCOSE 94 11/05/2013 1321   BUN 16 11/05/2013 1321   CREATININE 1.37* 11/05/2013 1321   CREATININE 1.31 03/29/2012 1713   CALCIUM 9.2 11/05/2013 1321   PROT 7.5 11/05/2013 1321   ALBUMIN 3.9 11/05/2013 1321   AST 39* 11/05/2013 1321   ALT 37 11/05/2013 1321   ALKPHOS 86 11/05/2013 1321   BILITOT 0.3 11/05/2013 1321   GFRNONAA 50* 11/05/2013 1321   GFRAA 58* 11/05/2013 1321       Component Value Date/Time   WBC 4.3 11/05/2013 1321   WBC 4.1 01/12/2011 1505   WBC 5.3 10/15/2008 0000   HGB 14.4 11/05/2013 1321   HGB 13.4 01/12/2011 1505   HGB 13.7 10/15/2008 0000   HCT 41.7 11/05/2013 1321   HCT 40.4 01/12/2011 1505   HCT 42.1 10/15/2008 0000   MCV 97.2 11/05/2013 1321   MCV 93.1 01/12/2011 1505   MCV 96.1 10/15/2008 0000    Lipid Panel     Component Value Date/Time   CHOL 156 01/12/2011 1505   TRIG 233* 01/12/2011 1505   HDL 25* 01/12/2011 1505   CHOLHDL 6.2 01/12/2011 1505   VLDL 47* 01/12/2011 1505   LDLCALC 84 01/12/2011 1505    ABG No results found for: PHART, PCO2ART, PO2ART, HCO3, TCO2, ACIDBASEDEF, O2SAT   No results found for: TSH BNP (last 3 results) No results for input(s): PROBNP in the last 8760 hours. Cardiac Panel (last 3 results) No results for input(s): CKTOTAL, CKMB, TROPONINI, RELINDX in the last 72 hours.  Iron/TIBC/Ferritin/ %Sat No results found for: IRON, TIBC, FERRITIN, IRONPCTSAT   EKG Orders placed or performed in visit on 04/30/13  . EKG 12-Lead     Prior Assessment and Plan Problem List as of 11/26/2013      Cardiovascular and Mediastinum   Arteriosclerotic cardiovascular disease (ASCVD)   Last Assessment & Plan   04/30/2013 Office Visit Written 04/30/2013  1:47 PM by Lendon Colonel, NP    There are significant concerns her cardiac status with ongoing tobacco abuse EtOH abuse and noncompliance issues. He is becoming more fatigued but tries to remain active. His had  stents to his hospital left anterior descending artery which was completed in 2010. With  no followup stress test completed since that time. With ongoing risk factors which have not been modified, Howard P. stress Myoview to evaluate for worsening ischemia are new areas of concern. He has been advised in the clinic to hold his carvedilol the night before and the morning of his stress test. Otherwise he is systolic smoking and drinking alcohol.  Most recent echocardiogram was completed in 2014 revealing a normal ejection fraction of 6065% with a small region of mild bibasilar hypokinesis. He does have some mild LVH noted present study. We will rereview his EF based on nuclear medicine study. He will continue carvedilol, aspirin, Lasix, lisinopril and simvastatin.    Deep vein thrombosis   Last Assessment & Plan   11/01/2012 Office Visit Written 11/01/2012  1:36 PM by Lendon Colonel, NP    Continues on Plavix with chronic LLE edema.     Hypertension   Last Assessment & Plan   04/30/2013 Office Visit Written 04/30/2013  1:47 PM by Lendon Colonel, NP    Pressure is currently well controlled. Will not make any changes in his medication regimen.      Digestive   Gastroesophageal reflux disease     Musculoskeletal and Integument   OSTEOARTHRITIS     Other   Hyperlipidemia   Last Assessment & Plan   04/30/2013 Office Visit Written 04/30/2013  1:48 PM by Lendon Colonel, NP    Labs are followed by Dr. Willey Blade to his office. He will continue to have this done per Dr. Willey Blade timing.    Tobacco abuse   Last Assessment & Plan   04/30/2013 Office Visit Written 04/30/2013  1:48 PM by Lendon Colonel, NP     He has been counseled yet again on his smoking. This is not a lifestyle change is willing to make at this time. I discussed with him progression of CAD as a result along with lung disease. He verbalizes understanding        Imaging: Ct Head Wo Contrast  11/05/2013   CLINICAL DATA:   Increasing alcohol consumption for several months. Depression. Confusion.  EXAM: CT HEAD WITHOUT CONTRAST  TECHNIQUE: Contiguous axial images were obtained from the base of the skull through the vertex without intravenous contrast.  COMPARISON:  07/12/2011  FINDINGS: Prominence of the sulci and ventricles identified compatible with brain atrophy. The cerebral and cerebellar hemispheres are otherwise normal in attenuation and morphology. No acute cortical infarct, hemorrhage, or mass lesion ispresent. No significant extra-axial fluid collection is present. The paranasal sinuses andmastoid air cells are clear. The osseous skull is intact.  IMPRESSION: 1. No acute intracranial abnormalities. 2. Mild brain atrophy.   Electronically Signed   By: Kerby Moors M.D.   On: 11/05/2013 18:42

## 2013-11-26 NOTE — Assessment & Plan Note (Signed)
Currently well controlled. No changes in medications with warnings as discussed above.

## 2013-11-26 NOTE — Assessment & Plan Note (Signed)
Ongoing. No plans to quit despite multiple recommendations and counseling to do so.

## 2013-11-26 NOTE — Patient Instructions (Signed)
Your physician recommends that you schedule a follow-up appointment in: [facility] physician recommends that you return for lab work CBC/CMP  [place]  Your physician recommends that you continue on your current medications as directed. Please refer to the Current Medication list given to you today.  Thank you for choosing [facility]!!

## 2013-11-26 NOTE — Assessment & Plan Note (Signed)
I am very concerned about his ETOH abuse in the setting of Plavix use. I have explained to him that alcohol should not be taken wit his cardiac medications. If he were to become drunk and fall, he could injure himself and cause bleeding on this antiplatlet medication. He is also on anxiety medications with the ETOH, which is equally dangerous, with use of Plavix.  Although he verbalizes understanding, he does not wish to seek help with ETOH abuse. I have warned him of the dangers of continuing this lifestyle. I have given him literature on AA, and local counselors from [place].

## 2013-11-27 LAB — CBC WITH DIFFERENTIAL/PLATELET
BASOS ABS: 0 10*3/uL (ref 0.0–0.1)
Basophils Relative: 0 % (ref 0–1)
Eosinophils Absolute: 0.2 10*3/uL (ref 0.0–0.7)
Eosinophils Relative: 3 % (ref 0–5)
HCT: 38.8 % — ABNORMAL LOW (ref 39.0–52.0)
Hemoglobin: 13.7 g/dL (ref 13.0–17.0)
LYMPHS PCT: 34 % (ref 12–46)
Lymphs Abs: 1.9 10*3/uL (ref 0.7–4.0)
MCH: 33.1 pg (ref 26.0–34.0)
MCHC: 35.3 g/dL (ref 30.0–36.0)
MCV: 93.7 fL (ref 78.0–100.0)
MONOS PCT: 15 % — AB (ref 3–12)
MPV: 10.9 fL (ref 9.4–12.4)
Monocytes Absolute: 0.9 10*3/uL (ref 0.1–1.0)
NEUTROS ABS: 2.7 10*3/uL (ref 1.7–7.7)
Neutrophils Relative %: 48 % (ref 43–77)
PLATELETS: 204 10*3/uL (ref 150–400)
RBC: 4.14 MIL/uL — ABNORMAL LOW (ref 4.22–5.81)
RDW: 14.2 % (ref 11.5–15.5)
WBC: 5.7 10*3/uL (ref 4.0–10.5)

## 2013-11-27 LAB — COMPLETE METABOLIC PANEL WITH GFR
ALBUMIN: 4.1 g/dL (ref 3.5–5.2)
ALK PHOS: 74 U/L (ref 39–117)
ALT: 27 U/L (ref 0–53)
AST: 27 U/L (ref 0–37)
BUN: 19 mg/dL (ref 6–23)
CALCIUM: 8.7 mg/dL (ref 8.4–10.5)
CHLORIDE: 103 meq/L (ref 96–112)
CO2: 26 mEq/L (ref 19–32)
Creat: 1.38 mg/dL — ABNORMAL HIGH (ref 0.50–1.35)
GFR, Est African American: 59 mL/min — ABNORMAL LOW
GFR, Est Non African American: 51 mL/min — ABNORMAL LOW
GLUCOSE: 106 mg/dL — AB (ref 70–99)
Potassium: 3.5 mEq/L (ref 3.5–5.3)
SODIUM: 136 meq/L (ref 135–145)
Total Bilirubin: 0.4 mg/dL (ref 0.2–1.2)
Total Protein: 7 g/dL (ref 6.0–8.3)

## 2013-11-29 ENCOUNTER — Telehealth: Payer: Self-pay

## 2013-11-29 ENCOUNTER — Encounter: Payer: Self-pay | Admitting: *Deleted

## 2013-11-29 NOTE — Telephone Encounter (Signed)
-----   Message from Lendon Colonel, NP sent at 11/28/2013 11:59 AM EST ----- Labs reviewed. Essentially unchanged since hospitalization. Continue medications that we have corrected and discussed on last office visit. He is to continue to stop clonidine and procardia. Take the metoprolol

## 2013-11-29 NOTE — Telephone Encounter (Signed)
Pt mailed letter, have attempted to call with no answer or call back for 2 days.

## 2014-02-03 ENCOUNTER — Encounter: Payer: Self-pay | Admitting: Orthopedic Surgery

## 2014-02-03 ENCOUNTER — Ambulatory Visit (INDEPENDENT_AMBULATORY_CARE_PROVIDER_SITE_OTHER): Payer: Medicare Other | Admitting: Orthopedic Surgery

## 2014-02-03 ENCOUNTER — Ambulatory Visit (INDEPENDENT_AMBULATORY_CARE_PROVIDER_SITE_OTHER): Payer: Medicare Other

## 2014-02-03 VITALS — BP 118/73 | Ht 72.0 in | Wt 210.0 lb

## 2014-02-03 DIAGNOSIS — M67912 Unspecified disorder of synovium and tendon, left shoulder: Secondary | ICD-10-CM

## 2014-02-03 DIAGNOSIS — M25512 Pain in left shoulder: Secondary | ICD-10-CM

## 2014-02-03 DIAGNOSIS — S46112A Strain of muscle, fascia and tendon of long head of biceps, left arm, initial encounter: Secondary | ICD-10-CM

## 2014-02-03 DIAGNOSIS — S46212A Strain of muscle, fascia and tendon of other parts of biceps, left arm, initial encounter: Secondary | ICD-10-CM

## 2014-02-03 NOTE — Progress Notes (Signed)
Patient ID: Alex Lara, male   DOB: [birth year], 74 y.o.   MRN: [id number]  Chief Complaint  Patient presents with  . Shoulder Problem    Left shoulder pain, no known injury    HPI Alex Lara is a 74 y.o. male.  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 He rates the pain 6 out of 10 and says it's constant. He tried some Advil didn't help. He did not lose any for elevation. He had a right rotator cuff repair distal clavicle excision and acromioplasty many years back. Review of systems fatigue during the years dental problem shortness of breath breathing issues at times ankle leg pain frequent urination depression anxiety back pain muscle weakness and joint pain the other systems were reviewed and were normal  Medical history of mental illness history of blood clot heart attack alcoholism  Previous right shoulder surgery and back surgery. Medications are listed and will be recorded in the scanned documents. Medication allergies none he said his family history is negative social history he does smoke he self-employed  I will list his medicines. He has delayed release E's omeprazole Plavix fluoxetine lisinopril prednisone levofloxacin pro-air magnesium oxide clonazepam simvastatin tramadol Prozac Coreg Lasix aspirin HPI  Review of Systems Review of Systems   Past Medical History  Diagnosis Date  . Arteriosclerotic cardiovascular disease (ASCVD)     06/2008: DES to the RCA and LAD  . Hyperlipidemia   . Tobacco abuse   . Chronic anticoagulation   . Deep vein thrombosis   . Degenerative joint disease   . Carpal tunnel syndrome   . Anxiety and depression   . Gastroesophageal reflux disease   . Hypertension     Past Surgical History  Procedure Laterality Date  . Inguinal hernia repair    . Rotator  cuff repair    . Orif-unknown fracture    . Colonoscopy  ?  Date  . Total hip arthroplasty  2004    Left    Family History  Problem Relation Age of Onset  . Coronary artery disease Father   . Coronary artery disease Mother     died post-CABG    Social History History  Substance Use Topics  . Smoking status: Current Every Day Smoker -- 1.00 packs/day for 55 years  . Smokeless tobacco: Not on file  . Alcohol Use: Yes     Comment: 1.5 bottles daily of whiskey - 2 bottles of wine daily    Allergies  Allergen Reactions  . Codeine Nausea And Vomiting and Other (See Comments)    "go fuzzy"    Current Outpatient Prescriptions  Medication Sig Dispense Refill  . Ascorbic Acid (VITAMIN C) 1000 MG tablet Take 1,000 mg by mouth daily.      Marland Kitchen aspirin 81 MG tablet Take by mouth daily.      . carvedilol (COREG) 12.5 MG tablet Take 12.5 mg by mouth 2 (two) times daily with a meal.    . clonazePAM (KLONOPIN) 0.5 MG tablet Take 0.5 mg by mouth 3 (three) times daily as needed for anxiety.     . clopidogrel (PLAVIX) 75 MG tablet Take 75 mg by mouth daily.   0  . fish oil-omega-3 fatty acids 1000 MG capsule Take 2 g by mouth daily.      Marland Kitchen FLUoxetine (PROZAC) 20  MG capsule Take 40 mg by mouth daily.      . furosemide (LASIX) 40 MG tablet Take 20 mg by mouth daily.     . Ibuprofen (ADVIL) 200 MG CAPS Take 600 mg by mouth 3 (three) times daily as needed (pain).    . Ibuprofen-Diphenhydramine HCl (ADVIL PM) 200-25 MG CAPS Take 1 tablet by mouth at bedtime as needed (sleep).    Marland Kitchen lisinopril (PRINIVIL,ZESTRIL) 20 MG tablet Take 20 mg by mouth daily.     . magnesium oxide (MAG-OX) 400 MG tablet Take 400 mg by mouth daily.    . Multiple Vitamins-Minerals (CENTRUM SILVER PO) Take 1 tablet by mouth daily.    Marland Kitchen NEXIUM 40 MG capsule Take 40 mg by mouth daily.     . simvastatin (ZOCOR) 40 MG tablet Take 20 mg by mouth at bedtime.      No current facility-administered medications for this visit.        Physical Exam Blood pressure 118/73, height 6' (1.829 m), weight 210 lb (95.255 kg). Physical Exam Gen. appearance normal frame small ectomorphic. Oriented 3. Mood affect normal. Ambulation normal. Cervical and thoracic spine no tenderness to palpation slight decreased range of motion in the cervical spine no instability muscle tone normal skin intact  Thoracic spine skin normal muscle tone normal slight kyphosis slight decrease extension no tenderness  Right shoulder previous surgical scars noted strength in the cuff is normal stability normal motion normal no tenderness He has palpable tenderness in the front of the shoulder over the biceps tendon has a Popeye sign his shoulder motion is actually normal shoulder stable he has good strength in his rotator cuff scans intact he has good distal pulse sensation is normal lymph nodes are negative   Medical decision making Data Reviewed Today's x-ray ordered reviewed independent interpretation 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  . Left shoulder pain   . Disorder of left rotator cuff Yes   biceps tendon proximal rupture?      Plan    He decided on nonoperative treatment so we gave him a subacromial injection  Follow-up as needed  Procedure note the subacromial injection shoulder left   Verbal consent was obtained to inject the  Left   Shoulder  Timeout was completed to confirm the injection site is a subacromial space of the  left  shoulder  Medication used Depo-Medrol 40 mg and lidocaine 1% 3 cc  Anesthesia was provided by ethyl chloride  The injection was performed in the left  posterior subacromial space. After pinning the skin with alcohol and anesthetized the skin with ethyl chloride the subacromial space was injected using a 20-gauge needle. There were no complications  Sterile dressing was applied.

## 2014-02-21 ENCOUNTER — Telehealth: Payer: Self-pay | Admitting: *Deleted

## 2014-02-21 NOTE — Telephone Encounter (Signed)
Pt wants to know if he can start giving blood again. He will call us back for answer/tmj

## 2014-02-21 NOTE — Telephone Encounter (Signed)
Will forward to Ms Purcell Nails NP for advise

## 2014-05-05 ENCOUNTER — Other Ambulatory Visit: Payer: Self-pay | Admitting: Adult Health

## 2014-05-19 ENCOUNTER — Other Ambulatory Visit: Payer: Self-pay | Admitting: Adult Health

## 2014-06-10 ENCOUNTER — Ambulatory Visit (INDEPENDENT_AMBULATORY_CARE_PROVIDER_SITE_OTHER): Payer: Medicare Other | Admitting: Orthopedic Surgery

## 2014-06-10 ENCOUNTER — Encounter: Payer: Self-pay | Admitting: Orthopedic Surgery

## 2014-06-10 VITALS — BP 122/83 | Ht 72.0 in | Wt 213.0 lb

## 2014-06-10 DIAGNOSIS — M67911 Unspecified disorder of synovium and tendon, right shoulder: Secondary | ICD-10-CM

## 2014-06-10 DIAGNOSIS — M67912 Unspecified disorder of synovium and tendon, left shoulder: Secondary | ICD-10-CM

## 2014-06-10 NOTE — Progress Notes (Signed)
Patient ID: MARQUIE ADERHOLD, male   DOB: July 23, 1940, 74 y.o.   MRN: [id number] Chief Complaint  Patient presents with  . Follow-up    bilateral shoulder pain   Patient has requested injections in both shoulders BP 122/83 mmHg  Ht 6' (1.829 m)  Wt 213 lb (96.616 kg)  BMI 28.88 kg/m2  Procedure note the subacromial injection shoulder left   Verbal consent was obtained to inject the  Left   Shoulder  Timeout was completed to confirm the injection site is a subacromial space of the  left  shoulder  Medication used Depo-Medrol 40 mg and lidocaine 1% 3 cc  Anesthesia was provided by ethyl chloride  The injection was performed in the left  posterior subacromial space. After pinning the skin with alcohol and anesthetized the skin with ethyl chloride the subacromial space was injected using a 20-gauge needle. There were no complications  Sterile dressing was applied.    Procedure note the subacromial injection shoulder RIGHT  Verbal consent was obtained to inject the  RIGHT   Shoulder  Timeout was completed to confirm the injection site is a subacromial space of the  RIGHT  shoulder   Medication used Depo-Medrol 40 mg and lidocaine 1% 3 cc  Anesthesia was provided by ethyl chloride  The injection was performed in the RIGHT  posterior subacromial space. After pinning the skin with alcohol and anesthetized the skin with ethyl chloride the subacromial space was injected using a 20-gauge needle. There were no complications  Sterile dressing was applied.

## 2014-09-19 ENCOUNTER — Other Ambulatory Visit: Payer: Self-pay | Admitting: Adult Health

## 2014-09-25 ENCOUNTER — Ambulatory Visit (INDEPENDENT_AMBULATORY_CARE_PROVIDER_SITE_OTHER): Payer: Medicare Other | Admitting: Adult Health

## 2014-09-25 ENCOUNTER — Encounter: Payer: Self-pay | Admitting: Adult Health

## 2014-09-25 VITALS — BP 130/78 | HR 65 | Ht 72.0 in | Wt 208.6 lb

## 2014-09-25 DIAGNOSIS — I10 Essential (primary) hypertension: Secondary | ICD-10-CM | POA: Diagnosis not present

## 2014-09-25 MED ORDER — CLOPIDOGREL BISULFATE 75 MG PO TABS: 75.0000 mg | ORAL_TABLET | Freq: Every day | ORAL | Status: DC

## 2014-09-25 MED ORDER — SIMVASTATIN 40 MG PO TABS: 40.0000 mg | ORAL_TABLET | Freq: Every day | ORAL | Status: DC

## 2014-09-25 MED ORDER — METOPROLOL TARTRATE 25 MG PO TABS: 12.5000 mg | ORAL_TABLET | Freq: Two times a day (BID) | ORAL | Status: DC

## 2014-09-25 NOTE — Patient Instructions (Signed)
Your physician wants you to follow-up in: 6 months with Dr. Harl Bowie. You will receive a reminder letter in the mail two months in advance. If you don't receive a letter, please call our office to schedule the follow-up appointment.  Your physician has recommended you make the following change in your medication:   START TAKING:   PLAVIX 75 MG DAILY SIMVASTATIN 40 MG DAILY METOPROLOL 12.5 MG TWO TIMES DAILY  Thank you for choosing [facility]!

## 2014-09-25 NOTE — Progress Notes (Signed)
Cardiology Office Note   Date:  09/25/2014   ID:  Alex Lara, DOB Jun 06, 1940, MRN [id number]  PCP:  Asencion Noble, MD  Cardiologist:  To be est Jory Sims, NP   Chief Complaint  Patient presents with  . Coronary Artery Disease      History of Present Illness: Alex Lara is a 74 y.o. male who presents for Alex Lara is a 74 y/o patient yet to be established with Dr. Bronson Ing or Dr. Harl Bowie we are following for ongoing assessment and management of CAD, Hypertension and Hyperlipidemia. He was last seen in April of 2015. He was stable from cardiac standpoint at that time and continued on his medical regimen.  He comes today without complaints. He is not taking several of his cardiac medications, these include carvedilol, Plavix, lisinopril and simvastatin. He states that they made him feel bad and he just stopped taking them. He continues to drink beer but has cut down to a 6 pk a week. He occasionally takes lasix prn. He is "managing my own body."   Labs are followed by Dr. Willey Blade.     Past Medical History  Diagnosis Date  . Arteriosclerotic cardiovascular disease (ASCVD)     06/2008: DES to the RCA and LAD  . Hyperlipidemia   . Tobacco abuse   . Chronic anticoagulation   . Deep vein thrombosis   . Degenerative joint disease   . Carpal tunnel syndrome   . Anxiety and depression   . Gastroesophageal reflux disease   . Hypertension     Past Surgical History  Procedure Laterality Date  . Inguinal hernia repair    . Rotator cuff repair    . Orif-unknown fracture    . Colonoscopy  ?  Date  . Total hip arthroplasty  2004    Left     Current Outpatient Prescriptions  Medication Sig Dispense Refill  . aspirin 81 MG tablet Take by mouth daily.      . clonazePAM (KLONOPIN) 0.5 MG tablet Take 0.5 mg by mouth 3 (three) times daily as needed for anxiety.     . fish oil-omega-3 fatty acids 1000 MG capsule Take 2 g by mouth daily.      . Ibuprofen-Diphenhydramine HCl (ADVIL  PM) 200-25 MG CAPS Take 1 tablet by mouth at bedtime as needed (sleep).    Marland Kitchen NEXIUM 40 MG capsule Take 40 mg by mouth daily.     . Ascorbic Acid (VITAMIN C) 1000 MG tablet Take 1,000 mg by mouth daily.      . carvedilol (COREG) 12.5 MG tablet Take 12.5 mg by mouth 2 (two) times daily with a meal.    . clopidogrel (PLAVIX) 75 MG tablet Take 1 tablet (75 mg total) by mouth daily. 90 tablet 3  . FLUoxetine (PROZAC) 20 MG capsule Take 40 mg by mouth daily.      . furosemide (LASIX) 40 MG tablet Take 20 mg by mouth daily.     . Ibuprofen (ADVIL) 200 MG CAPS Take 600 mg by mouth 3 (three) times daily as needed (pain).    Marland Kitchen lisinopril (PRINIVIL,ZESTRIL) 20 MG tablet Take 20 mg by mouth daily.     . magnesium oxide (MAG-OX) 400 MG tablet Take 400 mg by mouth daily.    . metoprolol tartrate (LOPRESSOR) 25 MG tablet Take 0.5 tablets (12.5 mg total) by mouth 2 (two) times daily. 90 tablet 3  . Multiple Vitamins-Minerals (CENTRUM SILVER PO) Take 1 tablet by mouth daily.    Marland Kitchen  simvastatin (ZOCOR) 40 MG tablet Take 1 tablet (40 mg total) by mouth at bedtime. 90 tablet 3   No current facility-administered medications for this visit.    Allergies:   Codeine    Social History:  The patient  reports that he has been smoking.  He does not have any smokeless tobacco history on file. He reports that he drinks alcohol.   Family History:  The patient's family history includes Coronary artery disease in his father and mother.    ROS: All other systems are reviewed and negative. Unless otherwise mentioned in H&P    PHYSICAL EXAM: VS:  BP 130/78 mmHg  Pulse 65  Ht 6' (1.829 m)  Wt 208 lb 9.6 oz (94.62 kg)  BMI 28.28 kg/m2  SpO2 94% , BMI Body mass index is 28.28 kg/(m^2). GEN: Well nourished, well developed, in no acute distress HEENT: normal Neck: no JVD, carotid bruits, or masses Cardiac: RRR; no murmurs, rubs, or gallops,no edema  Respiratory:  clear to auscultation bilaterally, normal work of  breathing GI: soft, nontender, nondistended, + BS MS: no deformity or atrophyDiminsihed pulses in the left LE.  Skin: warm and dry, no rash Neuro:  Strength and sensation are intact Psych: euthymic mood, full affect  Recent Labs: 11/26/2013: ALT 27; BUN 19; Creat 1.38*; Hemoglobin 13.7; Platelets 204; Potassium 3.5; Sodium 136    Lipid Panel    Component Value Date/Time   CHOL 156 01/12/2011 1505   TRIG 233* 01/12/2011 1505   HDL 25* 01/12/2011 1505   CHOLHDL 6.2 01/12/2011 1505   VLDL 47* 01/12/2011 1505   LDLCALC 84 01/12/2011 1505      Wt Readings from Last 3 Encounters:  09/25/14 208 lb 9.6 oz (94.62 kg)  06/10/14 213 lb (96.616 kg)  02/03/14 210 lb (95.255 kg)      ASSESSMENT AND PLAN:  1.  CAD: Stents to RCA and LAD. He has not been adherent to his medication regimen. I have counseled him on this and the need to have cardio[proetcitve medications on board.Marland Kitchen He is willing to go back on BB. I will change this to metoprolol 12.5 mg BID, he will restart simvastatin and Plavix. He will come back in 3 months to reassess his status.   2. Hypertension: Essentially controlled. He is not on ACE. I will wait to do this on next appt to avoid hypotension with reinstitution of low dose BB. He took lasix yesterday and states he urinated a lot. He takes this prn.   3. Ongoing tobacco abuse: I have counseled him on smoking cessation <10 minutes. He is not willing to stop at this time  4.ETOH abuse: He states he has cut down to a 6pk a week.   5. Medical Non-adherence: He is counseled on the need to take medications as directed to improve cardiac status and prevent further occurrences. He verbalizes understanding.   Current medicines are reviewed at length with the patient today.    Labs/ tests ordered today include: None  Orders Placed This Encounter  Procedures  . EKG 12-Lead   EKG: NSR with sinus bradycardia. Rate of 59 bpm.  Disposition:   FU with 6  months,  Signed, Jory Sims, NP  09/25/2014 4:10 PM    [facility] [address], [place], [place] [address] Phone: [phone]; Fax: [phone]

## 2014-09-25 NOTE — Progress Notes (Deleted)
Name: Alex Lara    DOB: 1940-02-18  Age: 74 y.o.  MR#: [id number]       PCP:  Asencion Noble, MD      Insurance: Payor: [place] / Plan: BCBS MEDICARE / Product Type: *No Product type* /   CC:    Chief Complaint  Patient presents with  . Coronary Artery Disease    VS Filed Vitals:   09/25/14 1406  BP: 130/78  Pulse: 65  Height: 6' (1.829 m)  Weight: 208 lb 9.6 oz (94.62 kg)  SpO2: 94%    Weights Current Weight  09/25/14 208 lb 9.6 oz (94.62 kg)  06/10/14 213 lb (96.616 kg)  02/03/14 210 lb (95.255 kg)    Blood Pressure  BP Readings from Last 3 Encounters:  09/25/14 130/78  06/10/14 122/83  02/03/14 118/73     Admit date:  (Not on file) Last encounter with RMR:  09/19/2014   Allergy Codeine  Current Outpatient Prescriptions  Medication Sig Dispense Refill  . aspirin 81 MG tablet Take by mouth daily.      . clonazePAM (KLONOPIN) 0.5 MG tablet Take 0.5 mg by mouth 3 (three) times daily as needed for anxiety.     . fish oil-omega-3 fatty acids 1000 MG capsule Take 2 g by mouth daily.      . Ibuprofen-Diphenhydramine HCl (ADVIL PM) 200-25 MG CAPS Take 1 tablet by mouth at bedtime as needed (sleep).    Marland Kitchen NEXIUM 40 MG capsule Take 40 mg by mouth daily.     . Ascorbic Acid (VITAMIN C) 1000 MG tablet Take 1,000 mg by mouth daily.      . carvedilol (COREG) 12.5 MG tablet Take 12.5 mg by mouth 2 (two) times daily with a meal.    . carvedilol (COREG) 12.5 MG tablet TAKE ONE TABLET BY MOUTH TWICE DAILY 60 tablet 11  . clopidogrel (PLAVIX) 75 MG tablet Take 75 mg by mouth daily.   0  . clopidogrel (PLAVIX) 75 MG tablet TAKE ONE TABLET BY MOUTH ONCE DAILY ** PLEASE MAKE APPOINTMENT FOR FURTHER REFILLS ** 30 tablet 1  . FLUoxetine (PROZAC) 20 MG capsule Take 40 mg by mouth daily.      . furosemide (LASIX) 40 MG tablet Take 20 mg by mouth daily.     . Ibuprofen (ADVIL) 200 MG CAPS Take 600 mg by mouth 3 (three) times daily as needed (pain).    Marland Kitchen lisinopril  (PRINIVIL,ZESTRIL) 20 MG tablet Take 20 mg by mouth daily.     Marland Kitchen lisinopril (PRINIVIL,ZESTRIL) 20 MG tablet TAKE ONE-HALF TABLET BY MOUTH IN THE MORNING AND ONE-HALF TAB AT NIGHT 90 tablet 0  . magnesium oxide (MAG-OX) 400 MG tablet Take 400 mg by mouth daily.    . Multiple Vitamins-Minerals (CENTRUM SILVER PO) Take 1 tablet by mouth daily.    . simvastatin (ZOCOR) 40 MG tablet Take 20 mg by mouth at bedtime.      No current facility-administered medications for this visit.    Discontinued Meds:   There are no discontinued medications.  Patient Active Problem List   Diagnosis Date Noted  . Arteriosclerotic cardiovascular disease (ASCVD)   . Hyperlipidemia   . Tobacco abuse   . Deep vein thrombosis   . Gastroesophageal reflux disease   . Hypertension   . OSTEOARTHRITIS 02/14/2006    LABS    Component Value Date/Time   NA 136 11/26/2013 1033   NA 139 11/05/2013 1321   NA 137 03/29/2012  1713   K 3.5 11/26/2013 1033   K 4.4 11/05/2013 1321   K 4.0 03/29/2012 1713   CL 103 11/26/2013 1033   CL 102 11/05/2013 1321   CL 104 03/29/2012 1713   CO2 26 11/26/2013 1033   CO2 24 11/05/2013 1321   CO2 25 03/29/2012 1713   GLUCOSE 106* 11/26/2013 1033   GLUCOSE 94 11/05/2013 1321   GLUCOSE 82 03/29/2012 1713   BUN 19 11/26/2013 1033   BUN 16 11/05/2013 1321   BUN 32* 03/29/2012 1713   CREATININE 1.38* 11/26/2013 1033   CREATININE 1.37* 11/05/2013 1321   CREATININE 1.31 03/29/2012 1713   CREATININE 1.32 01/12/2011 1505   CREATININE 1.17 06/18/2008 0144   CREATININE 0.88 06/13/2008 0555   CALCIUM 8.7 11/26/2013 1033   CALCIUM 9.2 11/05/2013 1321   CALCIUM 9.2 03/29/2012 1713   GFRNONAA 51* 11/26/2013 1033   GFRNONAA 50* 11/05/2013 1321   GFRNONAA >60 06/13/2008 0555   GFRNONAA >60 06/12/2008 0603   GFRAA 59* 11/26/2013 1033   GFRAA 58* 11/05/2013 1321   GFRAA  06/13/2008 0555    >60        The eGFR has been calculated using the MDRD equation. This calculation has not  been validated in all clinical situations. eGFR's persistently <60 mL/min signify possible Chronic Kidney Disease.   GFRAA  06/12/2008 0603    >60        The eGFR has been calculated using the MDRD equation. This calculation has not been validated in all clinical situations. eGFR's persistently <60 mL/min signify possible Chronic Kidney Disease.   CMP     Component Value Date/Time   NA 136 11/26/2013 1033   K 3.5 11/26/2013 1033   CL 103 11/26/2013 1033   CO2 26 11/26/2013 1033   GLUCOSE 106* 11/26/2013 1033   BUN 19 11/26/2013 1033   CREATININE 1.38* 11/26/2013 1033   CREATININE 1.37* 11/05/2013 1321   CALCIUM 8.7 11/26/2013 1033   PROT 7.0 11/26/2013 1033   ALBUMIN 4.1 11/26/2013 1033   AST 27 11/26/2013 1033   ALT 27 11/26/2013 1033   ALKPHOS 74 11/26/2013 1033   BILITOT 0.4 11/26/2013 1033   GFRNONAA 51* 11/26/2013 1033   GFRNONAA 50* 11/05/2013 1321   GFRAA 59* 11/26/2013 1033   GFRAA 58* 11/05/2013 1321       Component Value Date/Time   WBC 5.7 11/26/2013 1033   WBC 4.3 11/05/2013 1321   WBC 4.1 01/12/2011 1505   HGB 13.7 11/26/2013 1033   HGB 14.4 11/05/2013 1321   HGB 13.4 01/12/2011 1505   HCT 38.8* 11/26/2013 1033   HCT 41.7 11/05/2013 1321   HCT 40.4 01/12/2011 1505   MCV 93.7 11/26/2013 1033   MCV 97.2 11/05/2013 1321   MCV 93.1 01/12/2011 1505    Lipid Panel     Component Value Date/Time   CHOL 156 01/12/2011 1505   TRIG 233* 01/12/2011 1505   HDL 25* 01/12/2011 1505   CHOLHDL 6.2 01/12/2011 1505   VLDL 47* 01/12/2011 1505   LDLCALC 84 01/12/2011 1505    ABG No results found for: PHART, PCO2ART, PO2ART, HCO3, TCO2, ACIDBASEDEF, O2SAT   No results found for: TSH BNP (last 3 results) No results for input(s): BNP in the last 8760 hours.  ProBNP (last 3 results) No results for input(s): PROBNP in the last 8760 hours.  Cardiac Panel (last 3 results) No results for input(s): CKTOTAL, CKMB, TROPONINI, RELINDX in the last 72 hours.   Iron/TIBC/Ferritin/ %Sat No  results found for: IRON, TIBC, FERRITIN, IRONPCTSAT   EKG Orders placed or performed in visit on 09/25/14  . EKG 12-Lead     Prior Assessment and Plan Problem List as of 09/25/2014      Cardiovascular and Mediastinum   Arteriosclerotic cardiovascular disease (ASCVD)   Last Assessment & Plan 11/26/2013 Office Visit Written 11/26/2013  4:31 PM by Lendon Colonel, NP    I am very concerned about his ETOH abuse in the setting of Plavix use. I have explained to him that alcohol should not be taken wit his cardiac medications. If he were to become drunk and fall, he could injure himself and cause bleeding on this antiplatlet medication. He is also on anxiety medications with the ETOH, which is equally dangerous, with use of Plavix.  Although he verbalizes understanding, he does not wish to seek help with ETOH abuse. I have warned him of the dangers of continuing this lifestyle. I have given him literature on AA, and local counselors from [employer].       Deep vein thrombosis   Last Assessment & Plan 11/01/2012 Office Visit Written 11/01/2012  1:36 PM by Lendon Colonel, NP    Continues on Plavix with chronic LLE edema.       Hypertension   Last Assessment & Plan 11/26/2013 Office Visit Written 11/26/2013  4:31 PM by Lendon Colonel, NP    Currently well controlled. No changes in medications with warnings as discussed above.         Digestive   Gastroesophageal reflux disease     Musculoskeletal and Integument   OSTEOARTHRITIS     Other   Hyperlipidemia   Last Assessment & Plan 04/30/2013 Office Visit Written 04/30/2013  1:48 PM by Lendon Colonel, NP    Labs are followed by Dr. Willey Blade to his office. He will continue to have this done per Dr. Willey Blade timing.      Tobacco abuse   Last Assessment & Plan 11/26/2013 Office Visit Written 11/26/2013  4:32 PM by Lendon Colonel, NP    Ongoing. No plans to quit despite multiple recommendations and  counseling to do so.           Imaging: No results found.

## 2014-09-26 ENCOUNTER — Other Ambulatory Visit: Payer: Self-pay

## 2014-09-26 MED ORDER — CLOPIDOGREL BISULFATE 75 MG PO TABS: 75.0000 mg | ORAL_TABLET | Freq: Every day | ORAL | Status: DC

## 2014-11-04 ENCOUNTER — Telehealth: Payer: Self-pay

## 2014-11-04 NOTE — Telephone Encounter (Signed)
Wife reports pt saw [place] on 9/15 and on 9/20 his personally changed. He sits in chair all day sleeping,stays awake at night.states he has "worries in his mind" and waves at his head.Has history of altered mental status in past.Per Arnold Long NP, she suggested he should keep follow up with Dr.Fagan as planned. Also told wife to take pt to ED if he becomes a threat to himself or others. Wife agreed with plan

## 2014-11-13 ENCOUNTER — Encounter: Payer: Self-pay | Admitting: Orthopedic Surgery

## 2014-11-13 ENCOUNTER — Ambulatory Visit (INDEPENDENT_AMBULATORY_CARE_PROVIDER_SITE_OTHER): Payer: Medicare Other | Admitting: Orthopedic Surgery

## 2014-11-13 ENCOUNTER — Ambulatory Visit (INDEPENDENT_AMBULATORY_CARE_PROVIDER_SITE_OTHER): Payer: Medicare Other

## 2014-11-13 VITALS — BP 122/70 | Ht 72.0 in | Wt 208.6 lb

## 2014-11-13 DIAGNOSIS — S46112A Strain of muscle, fascia and tendon of long head of biceps, left arm, initial encounter: Secondary | ICD-10-CM

## 2014-11-13 DIAGNOSIS — M25512 Pain in left shoulder: Secondary | ICD-10-CM

## 2014-11-13 DIAGNOSIS — M67911 Unspecified disorder of synovium and tendon, right shoulder: Secondary | ICD-10-CM | POA: Diagnosis not present

## 2014-11-13 DIAGNOSIS — S46212A Strain of muscle, fascia and tendon of other parts of biceps, left arm, initial encounter: Secondary | ICD-10-CM

## 2014-11-13 DIAGNOSIS — M67912 Unspecified disorder of synovium and tendon, left shoulder: Secondary | ICD-10-CM | POA: Diagnosis not present

## 2014-11-13 NOTE — Progress Notes (Signed)
Chief complaint pain left shoulder  The patient said he was seen here in May and then 3 days later fell and injured his left shoulder picking up something with his arm supinated and below the horizontal. Sounds more like a biceps tendon rupture and he does have Popeye sign but he is also complaining of aching pain in the deltoid region upper shoulder and anterior joint line with decreased range of motion weakness in and him increased symptoms with overhead activity  System review his chest pain is under control with stents were placed after his heart attack is not short of breath no fever no chills but he has depression  His exam was as follows  Past Medical History  Diagnosis Date  . Arteriosclerotic cardiovascular disease (ASCVD)     06/2008: DES to the RCA and LAD  . Hyperlipidemia   . Tobacco abuse   . Chronic anticoagulation   . Deep vein thrombosis   . Degenerative joint disease   . Carpal tunnel syndrome   . Anxiety and depression   . Gastroesophageal reflux disease   . Hypertension      BP 122/70 mmHg  Ht 6' (1.829 m)  Wt 208 lb 9.6 oz (94.62 kg)  BMI 28.28 kg/m2  He is thin framed small frame and oriented 3 mood flat axillary lymph nodes bilaterally normal he has Popeye sign on the left tenderness in the anterior joint line decreased range of motion in the shoulder. Flexion 120 active 150 passive pain between 100 2050. Negative apprehension stable. Mild weakness rotator cuff supraspinous. Skin intact sensation normal pulses good  Right shoulder positive impingement sign stiffness clicking popping  Note left shoulder was popping as well with motion  X-ray left shoulder chronic rotator cuff changes with proximal migration of the humerus this was personally reviewed and interpreted by me see my report.  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 After pinning the skin with alcohol and anesthetized the skin with ethyl chloride the subacromial space was injected using a 20-gauge needle. There were no complications  Sterile dressing was applied.    As he was finishing of the visit he says  can't I have one of my right shoulder to  So we put one in his right shoulder when he had a positive impingement exam   Procedure note the subacromial injection shoulder RIGHT  Verbal consent was obtained to inject the  RIGHT   Shoulder  Timeout was completed to confirm the injection site is a subacromial space of the  RIGHT  shoulder   Medication used Depo-Medrol 40 mg and lidocaine 1% 3 cc  Anesthesia was provided by ethyl chloride  The injection was performed in the RIGHT  posterior subacromial space. After pinning the skin with alcohol and anesthetized the skin with ethyl chloride the subacromial space was injected using a 20-gauge needle. There were no complications  Sterile dressing was applied.

## 2015-01-08 ENCOUNTER — Ambulatory Visit (HOSPITAL_COMMUNITY): Payer: Self-pay | Admitting: Psychiatry

## 2015-02-25 ENCOUNTER — Ambulatory Visit (HOSPITAL_COMMUNITY): Payer: Self-pay | Admitting: Psychiatry

## 2015-03-06 ENCOUNTER — Telehealth: Payer: Self-pay | Admitting: Adult Health

## 2015-03-06 NOTE — Telephone Encounter (Signed)
Please send letter stating that he has DES that was placed in 2014. He is safe to proceed with MRI with this stent. No restrictions on drug eluding stents for MRI;s.

## 2015-03-06 NOTE — Telephone Encounter (Signed)
Can't all new stents have MRI's

## 2015-03-06 NOTE — Telephone Encounter (Signed)
Patient states he needs letter stating what kind of stents he has faxed to [phone] Attn:Brenda. Patient needs MRI. /t g

## 2015-03-09 ENCOUNTER — Other Ambulatory Visit (HOSPITAL_COMMUNITY): Payer: Self-pay | Admitting: Orthopedic Surgery

## 2015-03-09 DIAGNOSIS — R52 Pain, unspecified: Secondary | ICD-10-CM

## 2015-03-09 NOTE — Telephone Encounter (Signed)
Letter faxed.

## 2015-03-12 ENCOUNTER — Telehealth (HOSPITAL_COMMUNITY): Payer: Self-pay | Admitting: *Deleted

## 2015-03-19 ENCOUNTER — Ambulatory Visit (HOSPITAL_COMMUNITY)
Admission: RE | Admit: 2015-03-19 | Discharge: 2015-03-19 | Disposition: A | Discharge status: Home / Self Care | Payer: Medicare Other | Source: Ambulatory Visit | Attending: Orthopedic Surgery | Admitting: Orthopedic Surgery

## 2015-03-19 DIAGNOSIS — M25512 Pain in left shoulder: Secondary | ICD-10-CM | POA: Insufficient documentation

## 2015-03-19 DIAGNOSIS — M19012 Primary osteoarthritis, left shoulder: Secondary | ICD-10-CM | POA: Insufficient documentation

## 2015-03-19 DIAGNOSIS — R52 Pain, unspecified: Secondary | ICD-10-CM

## 2015-03-19 DIAGNOSIS — M75102 Unspecified rotator cuff tear or rupture of left shoulder, not specified as traumatic: Secondary | ICD-10-CM | POA: Diagnosis not present

## 2015-03-24 ENCOUNTER — Encounter: Payer: Self-pay | Admitting: *Deleted

## 2015-03-24 ENCOUNTER — Ambulatory Visit (INDEPENDENT_AMBULATORY_CARE_PROVIDER_SITE_OTHER): Payer: Medicare Other | Admitting: Cardiology

## 2015-03-24 ENCOUNTER — Encounter: Payer: Self-pay | Admitting: Cardiology

## 2015-03-24 VITALS — BP 107/77 | HR 74 | Ht 72.0 in | Wt 218.0 lb

## 2015-03-24 DIAGNOSIS — E785 Hyperlipidemia, unspecified: Secondary | ICD-10-CM

## 2015-03-24 DIAGNOSIS — I10 Essential (primary) hypertension: Secondary | ICD-10-CM | POA: Diagnosis not present

## 2015-03-24 DIAGNOSIS — Z136 Encounter for screening for cardiovascular disorders: Secondary | ICD-10-CM

## 2015-03-24 DIAGNOSIS — I2510 Atherosclerotic heart disease of native coronary artery without angina pectoris: Secondary | ICD-10-CM

## 2015-03-24 MED ORDER — PRAVASTATIN SODIUM 20 MG PO TABS: 20.0000 mg | ORAL_TABLET | Freq: Every evening | ORAL | Status: DC

## 2015-03-24 NOTE — Patient Instructions (Signed)
Your physician wants you to follow-up in: [place] DR. BRANCH You will receive a reminder letter in the mail two months in advance. If you don't receive a letter, please call our office to schedule the follow-up appointment.  Your physician has recommended you make the following change in your medication:   STOP PLAVIX  START PRAVASTATIN 20 MG DAILY  Your physician has requested that you have an abdominal aorta duplex. During this test, an ultrasound is used to evaluate the aorta. Allow 30 minutes for this exam. Do not eat after midnight the day before and avoid carbonated beverages  Thank you for choosing [facility]!!

## 2015-03-24 NOTE — Progress Notes (Signed)
Patient ID: [id number] PETRIN, male   DOB: [birth year], 75 y.o.   MRN: [id number]     Clinical Summary Mr. Natt is a 75 y.o.male last seen by NP Purcell Nails, this is our first visit together. He is seen for the following medical problems.  1. CAD - history of  DES x 2 to LAD in 06/2008. Documented PMH on epic reports stent to RCA at that time as well however I do not see this indicated in any cath reports - from prior notes mixed history of medication compliance.   - no recent chest pain. Denies any SOB or DOE.  2. HTN - compliant with meds  3. Hyperlipidemia - he stopped simvastatin on his own. He states it caused him to feel very fatigued.    4. Shoulder surgery - being considered for surgery - reports can walk up 1 flight of stairs without troubles. Can run over 15 minutes without troubles, example running after his chickens at home in his yard.   Past Medical History  Diagnosis Date  . Arteriosclerotic cardiovascular disease (ASCVD)     06/2008: DES to the RCA and LAD  . Hyperlipidemia   . Tobacco abuse   . Chronic anticoagulation   . Deep vein thrombosis ([place])   . Degenerative joint disease   . Carpal tunnel syndrome   . Anxiety and depression   . Gastroesophageal reflux disease   . Hypertension      Allergies  Allergen Reactions  . Codeine Nausea And Vomiting and Other (See Comments)    "go fuzzy"     Current Outpatient Prescriptions  Medication Sig Dispense Refill  . Ascorbic Acid (VITAMIN C) 1000 MG tablet Take 1,000 mg by mouth daily.      Marland Kitchen aspirin 81 MG tablet Take by mouth daily.      . carvedilol (COREG) 12.5 MG tablet Take 12.5 mg by mouth 2 (two) times daily with a meal.    . clonazePAM (KLONOPIN) 0.5 MG tablet Take 0.5 mg by mouth 3 (three) times daily as needed for anxiety.     . clopidogrel (PLAVIX) 75 MG tablet Take 1 tablet (75 mg total) by mouth daily. 90 tablet 3  . fish oil-omega-3 fatty acids 1000 MG capsule Take 2 g by mouth daily.      Marland Kitchen  FLUoxetine (PROZAC) 20 MG capsule Take 40 mg by mouth daily.      . furosemide (LASIX) 40 MG tablet Take 20 mg by mouth daily.     . Ibuprofen (ADVIL) 200 MG CAPS Take 600 mg by mouth 3 (three) times daily as needed (pain).    . Ibuprofen-Diphenhydramine HCl (ADVIL PM) 200-25 MG CAPS Take 1 tablet by mouth at bedtime as needed (sleep).    Marland Kitchen lisinopril (PRINIVIL,ZESTRIL) 20 MG tablet Take 20 mg by mouth daily.     . magnesium oxide (MAG-OX) 400 MG tablet Take 400 mg by mouth daily.    . metoprolol tartrate (LOPRESSOR) 25 MG tablet Take 0.5 tablets (12.5 mg total) by mouth 2 (two) times daily. 90 tablet 3  . Multiple Vitamins-Minerals (CENTRUM SILVER PO) Take 1 tablet by mouth daily.    Marland Kitchen NEXIUM 40 MG capsule Take 40 mg by mouth daily.     . simvastatin (ZOCOR) 40 MG tablet Take 1 tablet (40 mg total) by mouth at bedtime. 90 tablet 3   No current facility-administered medications for this visit.     Past Surgical History  Procedure Laterality Date  . Inguinal hernia  repair    . Rotator cuff repair    . Orif-unknown fracture    . Colonoscopy  ?  Date  . Total hip arthroplasty  2004    Left     Allergies  Allergen Reactions  . Codeine Nausea And Vomiting and Other (See Comments)    "go fuzzy"      Family History  Problem Relation Age of Onset  . Coronary artery disease Father   . Coronary artery disease Mother     died post-CABG     Social History Mr. Stvil reports that he has been smoking.  He does not have any smokeless tobacco history on file. Mr. Running reports that he drinks alcohol.   Review of Systems CONSTITUTIONAL: No weight loss, fever, chills, weakness or fatigue.  HEENT: Eyes: No visual loss, blurred vision, double vision or yellow sclerae.No hearing loss, sneezing, congestion, runny nose or sore throat.  SKIN: No rash or itching.  CARDIOVASCULAR: per HPI RESPIRATORY: No shortness of breath, cough or sputum.  GASTROINTESTINAL: No anorexia, nausea, vomiting  or diarrhea. No abdominal pain or blood.  GENITOURINARY: No burning on urination, no polyuria NEUROLOGICAL: No headache, dizziness, syncope, paralysis, ataxia, numbness or tingling in the extremities. No change in bowel or bladder control.  MUSCULOSKELETAL: No muscle, back pain, joint pain or stiffness.  LYMPHATICS: No enlarged nodes. No history of splenectomy.  PSYCHIATRIC: No history of depression or anxiety.  ENDOCRINOLOGIC: No reports of sweating, cold or heat intolerance. No polyuria or polydipsia.  Marland Kitchen   Physical Examination Filed Vitals:   03/24/15 1324  BP: 107/77  Pulse: 74   Filed Vitals:   03/24/15 1324  Height: 6' (1.829 m)  Weight: 218 lb (98.884 kg)    Gen: resting comfortably, no acute distress HEENT: no scleral icterus, pupils equal round and reactive, no palptable cervical adenopathy,  CV: RRR, no m/r/g, no jvd Resp: Clear to auscultation bilaterally GI: abdomen is soft, non-tender, non-distended, normal bowel sounds, no hepatosplenomegaly MSK: extremities are warm, no edema.  Skin: warm, no rash Neuro:  no focal deficits Psych: appropriate affect   Diagnostic Studies 03/2012 echo Study Conclusions  Left ventricle: LVEF is approximately 60 to 65% with small region of mild basilar hypokinesis. The cavity size was normal. Wall thickness was increased in a pattern of mild LVH.       Transthoracic echocardiography. M-mode, complete 2D, spectral Doppler, and color Doppler. Height: Height: 182.9cm. Height: 72in. Weight: Weight: 94.3kg. Weight: 207.6lb. Body mass index: BMI: 28.2kg/m^2. Body surface area:  BSA: 2.4m^2. Patient status: Outpatient. Location: Echo laboratory.     Assessment and Plan  1. CAD - no current symptoms - continue current meds. Stop plavix as no indication for continued use  2. HTN - at goal, continue current meds  3. Hyperlipidemia - reports symptoms on simva, will try pravastatin 20mg  daily  4.  Preopeartive evaluation - he is being considered for shoulder surgery.He has no active acute cardiac conditions. He can tolerate >4METs without limitation by report. Recommend proceeding with surgery as planned  5. AAA screen - male >84 yo smoke, AAA screening US is indicated.     Arnoldo Lenis, M.D.

## 2015-03-25 ENCOUNTER — Encounter: Payer: Self-pay | Admitting: *Deleted

## 2015-04-01 ENCOUNTER — Other Ambulatory Visit: Payer: Self-pay

## 2015-04-13 ENCOUNTER — Encounter (HOSPITAL_COMMUNITY): Payer: Self-pay

## 2015-04-13 ENCOUNTER — Ambulatory Visit (HOSPITAL_COMMUNITY): Payer: Medicare Other | Attending: Orthopedic Surgery

## 2015-04-13 DIAGNOSIS — M6281 Muscle weakness (generalized): Secondary | ICD-10-CM | POA: Insufficient documentation

## 2015-04-13 DIAGNOSIS — M25512 Pain in left shoulder: Secondary | ICD-10-CM | POA: Insufficient documentation

## 2015-04-13 DIAGNOSIS — Z9889 Other specified postprocedural states: Secondary | ICD-10-CM | POA: Insufficient documentation

## 2015-04-13 DIAGNOSIS — M25612 Stiffness of left shoulder, not elsewhere classified: Secondary | ICD-10-CM

## 2015-04-13 NOTE — Therapy (Signed)
[place] [place], [place], [address] Phone: [phone]   Fax:  [phone]  Occupational Therapy Evaluation  Patient Details  Name: Alex Lara MRN: [id number] Date of Birth: [birth year] Referring Provider: Earlie Server, MD  Encounter Date: 04/13/2015      OT End of Session - 04/13/15 1218    Visit Number 1   Number of Visits 24   Date for OT Re-Evaluation 06/12/15  mini reassess: 05/11/15   Authorization Type BCBS Medicare - $40 co pay   Authorization Time Period before 10th visit   Authorization - Visit Number 1   Authorization - Number of Visits 10   OT Start Time 1105   OT Stop Time 1145   OT Time Calculation (min) 40 min   Activity Tolerance Patient tolerated treatment well   Behavior During Therapy [facility] for tasks assessed/performed      Past Medical History  Diagnosis Date  . Arteriosclerotic cardiovascular disease (ASCVD)     06/2008: DES to the RCA and LAD  . Hyperlipidemia   . Tobacco abuse   . Chronic anticoagulation   . Deep vein thrombosis ([place])   . Degenerative joint disease   . Carpal tunnel syndrome   . Anxiety and depression   . Gastroesophageal reflux disease   . Hypertension     Past Surgical History  Procedure Laterality Date  . Inguinal hernia repair    . Rotator cuff repair    . Orif-unknown fracture    . Colonoscopy  ?  Date  . Total hip arthroplasty  2004    Left    There were no vitals filed for this visit.  Visit Diagnosis:  S/P rotator cuff surgery - Plan: Ot plan of care cert/re-cert  Pain in left shoulder - Plan: Ot plan of care cert/re-cert  Muscle weakness (generalized) - Plan: Ot plan of care cert/re-cert  Stiffness of shoulder joint, left - Plan: Ot plan of care cert/re-cert      Subjective Assessment - 04/13/15 1151    Subjective  S: I don't like ice.    Pertinent History Patient is a 75 y/o male S/P left shoulder scope and RCR. Surgery was completed on  04/08/15 per patient. Patient was told to wear sling at all times for 4 weeks. Dr. French Ana has referred patient to occupational therapy for evaluation and treatment.    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    Currently in Pain? Yes   Pain Score 6    Pain Location Shoulder   Pain Orientation Left   Pain Descriptors / Indicators Constant;Aching   Pain Type Surgical pain   Pain Radiating Towards N/A   Pain Onset 1 to 4 weeks ago   Pain Frequency Constant   Aggravating Factors  Movement   Pain Relieving Factors Pain medication   Effect of Pain on Daily Activities Unable to complete any activity with LUE.    Multiple Pain Sites No           [facility] OT Assessment - 04/13/15 1108    Assessment   Diagnosis left shoulder scope and RCR   Referring Provider Earlie Server, MD   Onset Date 04/08/15  surgery   Prior Therapy None   Precautions   Precautions Shoulder   Type of Shoulder Precautions Pt was told to wear sling for 4 weeks (05/06/15) Complete P/ROM for 4 weeks.  Begin AA/ROM on 05/06/15. Begin A/ROM at 6 weeks (05/20/15)  Begin strengthening at 8 weeks (06/03/15).   Shoulder Interventions Shoulder sling/immobilizer   Restrictions   Weight Bearing Restrictions Yes   Balance Screen   Has the patient fallen in the past 6 months Yes   How many times? 1   Has the patient had a decrease in activity level because of a fear of falling?  No   Is the patient reluctant to leave their home because of a fear of falling?  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    Mobility   Mobility  Status Independent   Written Expression   Dominant Hand Right   Vision - History   Baseline Vision No visual deficits   Cognition   Overall Cognitive Status Within Functional Limits for tasks assessed   ROM / Strength   AROM / PROM / Strength Strength;AROM;PROM   Palpation   Palpation comment Max fascial restrictions in left upper arm, trapezius, and scapularis region.    AROM   Overall AROM  Unable to assess;Due to precautions   Overall AROM Comments --   AROM Assessment Site Shoulder   Right/Left Shoulder Left   Left Shoulder Flexion --   Left Shoulder ABduction --   Left Shoulder Internal Rotation --   Left Shoulder External Rotation --   PROM   Overall PROM Comments Assessed supine. IR/er adducted.   PROM Assessment Site Shoulder   Right/Left Shoulder Left   Left Shoulder Flexion 115 Degrees   Left Shoulder ABduction 120 Degrees   Left Shoulder Internal Rotation 90 Degrees   Left Shoulder External Rotation 35 Degrees   Strength   Overall Strength Unable to assess;Due to precautions   Strength Assessment Site Shoulder   Right/Left Shoulder Left                         OT Education - 04/13/15 1129    Education provided Yes   Education Details table slides, pendulums, A/ROM wrist, P/ROM elbow   Person(s) Educated Patient   Methods Explanation;Demonstration;Verbal cues;Handout;Tactile cues   Comprehension Verbalized understanding;Returned demonstration          OT Short Term Goals - 04/13/15 1226    OT SHORT TERM GOAL #1   Title Patient will be educated and independent with HEP to increase functional performance during daily tasks using LUE.    Time 6   Period Weeks   Status New   OT SHORT TERM GOAL #2   Title Patient will increase P/ROM to WNL to increase ability to get shirts on and off with less difficulty.    Time 6   Period Weeks   Status New   OT SHORT TERM GOAL #3   Title Patient will increase LUE strength to 3+/5 to increase ability  to complete task under shoulder level.    Time 6   Period Weeks   Status New   OT SHORT TERM GOAL #4   Title Patient will decrease fascial restrictions to mod amount to increase functional mobility in LUE that is needed during daily tasks.  Time 6   Period Weeks   Status New   OT SHORT TERM GOAL #5   Title Patient will decrease pain level to 4/10 in LUE during daily tasks.   Time 6   Period Weeks   Status New           OT Long Term Goals - 2015-04-14 1231    OT LONG TERM GOAL #1   Title Patient will return to highest level of independence with all daily tasks using LUE.    Time 12   Period Weeks   Status New   OT LONG TERM GOAL #2   Title Patient will increase A/ROM to WNL to increase ability to complete overhead tasks.    Time 12   Period Weeks   Status New   OT LONG TERM GOAL #3   Title Patient will increase strength in LUE to 4+/5 to be able to return to required work tasks.    Time 12   Period Weeks   Status New   OT LONG TERM GOAL #4   Title Patient will decrease pain level to 2/10 or less when completing daily tasks using LUE.    Time 12   Period Weeks   Status New   OT LONG TERM GOAL #5   Title Patient will decrease fascial restrictions to min amount or less to increase functional mobility needed to complete daily tasks.   Time 12   Period Weeks   Status New               Plan - 2015/04/14 04/13/19    Clinical Impression Statement A: patient is a 75 y/o male S/P left scope and RCR causing increased pain and fascial restrictions and decreased ROM and strength resulting in difficulty with functional performance during daily tasks using LUE.   Pt will benefit from skilled therapeutic intervention in order to improve on the following deficits (Retired) Decreased strength;Pain;Impaired UE functional use;Increased fascial restricitons;Decreased range of motion   Rehab Potential Excellent   OT Frequency 2x / week   OT Duration 12 weeks   OT  Treatment/Interventions Self-care/ADL training;Ultrasound;DME and/or AE instruction;Scar mobilization;Passive range of motion;Patient/family education;Cryotherapy;Electrical Stimulation;Moist Heat;Therapeutic activities;Therapeutic exercises;Manual Therapy   Plan P: Skilled OT services needed to increase functional performance during daily tasks using LUE. Treatment Plan: myofascial release, manual stretching , P/ROM, AA/ROM, A/ROM, general and scapular strengthening.    Consulted and Agree with Plan of Care Patient          G-Codes - 2015-04-14 12-Apr-1236    Functional Assessment Tool Used FOTO score: 19/100 (81% impaired)   Functional Limitation Carrying, moving and handling objects   Carrying, Moving and Handling Objects Current Status [phone]) At least 80 percent but less than 100 percent impaired, limited or restricted   Carrying, Moving and Handling Objects Goal Status [id number]) At least 20 percent but less than 40 percent impaired, limited or restricted      Problem List Patient Active Problem List   Diagnosis Date Noted  . Arteriosclerotic cardiovascular disease (ASCVD)   . Hyperlipidemia   . Tobacco abuse   . Deep vein thrombosis ([place])   . Gastroesophageal reflux disease   . Hypertension   . OSTEOARTHRITIS 02/14/2006    Ailene Ravel, OTR/L,CBIS  [phone]  April 14, 2015, 12:41 PM  [facility] [address] [place], [place], [address] Phone: [phone]   Fax:  [phone]  Name: Alex Lara MRN: [id number] Date of Birth: 1940/05/26

## 2015-04-13 NOTE — Patient Instructions (Signed)
TOWEL SLIDES COMPLETE FOR 1-3 MINUTES, 3-5 TIMES PER DAY  SHOULDER: Flexion On Table   Place hands on table, elbows straight. Move hips away from body. Press hands down into table.  Abduction (Passive)   With arm out to side, resting on table, lower head toward arm, keeping trunk away from table.  Copyright  VHI. All rights reserved.     Internal Rotation (Assistive)   Seated with elbow bent at right angle and held against side, slide arm on table surface in an inward arc. Activity: Use this motion to brush crumbs off the table.  Copyright  VHI. All rights reserved.    COMPLETE PENDULUM EXERCISES FOR 30 SECONDS TO A MINUTE EACH, 3-5 TIMES PER DAY. ROM: Pendulum (Side-to-Side)     http://orth.exer.us/792   Copyright  VHI. All rights reserved.  Pendulum Forward/Back   Bend forward 90 at waist, using table for support. Rock body forward and back to swing arm.  Copyright  VHI. All rights reserved.  Pendulum Circular   Bend forward 90 at waist, leaning on table for support. Rock body in a circular pattern to move arm clockwise then counterclockwise.  Copyright  VHI. All rights reserved.  AROM: Wrist Extension   With right palm down, bend wrist up.   Copyright  VHI. All rights reserved.   AROM: Wrist Flexion   With right palm up, bend wrist up. Repeat ___10_ times per set. Do __1__ sets per session. Do __3__ sessions per day.  Copyright  VHI. All rights reserved.   AROM: Forearm Pronation / Supination   With right arm in handshake position, slowly rotate palm down until stretch is felt. Relax. Then rotate palm up until stretch is felt. Repeat __10__ times per set. Do _1___ sets per session. Do __3__ sessions per day.  Copyright  VHI. All rights reserved.   AFlexion (Passive)   Use other hand to bend elbow, with thumb toward same shoulder. Do NOT force this motion.   Copyright  VHI. All rights reserved.    With left hand palm up, gently  bend elbow as far as possible. Then straighten arm as far as possible.   Copyright  VHI. All rights reserved.

## 2015-04-21 ENCOUNTER — Encounter (HOSPITAL_COMMUNITY): Payer: Self-pay

## 2015-04-21 NOTE — Therapy (Signed)
[place], [place], [address] Phone: [phone]   Fax:  [phone]  Patient Details  Name: Alex Lara MRN: [id number] Date of Birth: 1940/08/08 Referring Provider:  No ref. provider found  Encounter Date: 04/21/2015 OCCUPATIONAL THERAPY DISCHARGE SUMMARY  Visits from Start of Care: 1  Current functional level related to goals / functional outcomes: No goals have been met. Patient only completed initial OT evaluation.    Remaining deficits: Patient called therapy clinic and stated that he needs to cancel all therapy appointments. He said the dr told him that strenuous therapy was being done right now and that's not what he wanted.  Unsure if this is accurate as patient has only completed initial OT evaluation on shoulder and no treatment has been completed.     Education / Equipment: HEP provided including: table slides, pendulums, A/ROM wrist, P/ROM elbow Plan: Patient agrees to discharge.  Patient goals were not met. Patient is being discharged due to meeting the stated rehab goals.  ?????       Ailene Ravel, OTR/L,CBIS  [phone]  04/21/2015, 4:13 PM  [facility] [address] [place], [place], [address] Phone: [phone]   Fax:  [phone]

## 2015-04-22 ENCOUNTER — Other Ambulatory Visit: Payer: Self-pay

## 2015-04-22 ENCOUNTER — Encounter (HOSPITAL_COMMUNITY): Payer: Self-pay

## 2015-04-24 ENCOUNTER — Encounter (HOSPITAL_COMMUNITY): Payer: Self-pay | Admitting: Occupational Therapy

## 2015-04-28 ENCOUNTER — Encounter (HOSPITAL_COMMUNITY): Payer: Self-pay | Admitting: Occupational Therapy

## 2015-04-30 ENCOUNTER — Encounter (HOSPITAL_COMMUNITY): Payer: Self-pay | Admitting: Occupational Therapy

## 2015-05-05 ENCOUNTER — Encounter (HOSPITAL_COMMUNITY): Payer: Self-pay

## 2015-05-07 ENCOUNTER — Encounter (HOSPITAL_COMMUNITY): Payer: Self-pay | Admitting: Occupational Therapy

## 2015-05-12 ENCOUNTER — Encounter (HOSPITAL_COMMUNITY): Payer: Self-pay

## 2015-05-14 ENCOUNTER — Encounter (HOSPITAL_COMMUNITY): Payer: Self-pay

## 2015-05-19 ENCOUNTER — Encounter (HOSPITAL_COMMUNITY): Payer: Self-pay | Admitting: Occupational Therapy

## 2015-05-21 ENCOUNTER — Encounter (HOSPITAL_COMMUNITY): Payer: Self-pay

## 2015-05-26 ENCOUNTER — Encounter (HOSPITAL_COMMUNITY): Payer: Self-pay

## 2015-05-28 ENCOUNTER — Encounter (HOSPITAL_COMMUNITY): Payer: Self-pay

## 2015-10-26 ENCOUNTER — Encounter: Payer: Self-pay | Admitting: Orthopedic Surgery

## 2015-10-26 ENCOUNTER — Ambulatory Visit (INDEPENDENT_AMBULATORY_CARE_PROVIDER_SITE_OTHER): Payer: Medicare Other | Admitting: Orthopedic Surgery

## 2015-10-26 ENCOUNTER — Ambulatory Visit (INDEPENDENT_AMBULATORY_CARE_PROVIDER_SITE_OTHER): Payer: Medicare Other

## 2015-10-26 DIAGNOSIS — M25511 Pain in right shoulder: Secondary | ICD-10-CM

## 2015-10-26 NOTE — Progress Notes (Signed)
Chief Complaint  Patient presents with  . Shoulder Pain    right shoulder pain   HPI   75 year old male had chronic history of right shoulder pain recently had a left rotator cuff repair in [facility] in March 2017  Right shoulder dislocation of the pain the quality is dull ache severity is moderate duration is several months timing is worse with exercise context worsening and recurrent  Review of Systems  Constitutional: Negative for chills and fever.  Cardiovascular: Negative for chest pain.  Psychiatric/Behavioral: Positive for depression.    Past Medical History:  Diagnosis Date  . Anxiety and depression   . Arteriosclerotic cardiovascular disease (ASCVD)    06/2008: DES to the RCA and LAD  . Carpal tunnel syndrome   . Chronic anticoagulation   . Deep vein thrombosis ([place])   . Degenerative joint disease   . Gastroesophageal reflux disease   . Hyperlipidemia   . Hypertension   . Tobacco abuse     Past Surgical History:  Procedure Laterality Date  . COLONOSCOPY  ?  Date  . INGUINAL HERNIA REPAIR    . ORIF-unknown fracture    . ROTATOR CUFF REPAIR    . TOTAL HIP ARTHROPLASTY  2004   Left   Family History  Problem Relation Age of Onset  . Coronary artery disease Father   . Coronary artery disease Mother     died post-CABG   Social History  Substance Use Topics  . Smoking status: Current Every Day Smoker    Packs/day: 1.00    Years: 55.00  . Smokeless tobacco: Not on file  . Alcohol use 0.0 oz/week     Comment: 1.5 bottles daily of whiskey - 2 bottles of wine daily   Current Meds  Medication Sig  . clonazePAM (KLONOPIN) 0.5 MG tablet Take 0.5 mg by mouth 3 (three) times daily as needed for anxiety.   . FLUOXETINE HCL PO Take by mouth.  . Ibuprofen (ADVIL) 200 MG CAPS Take 600 mg by mouth 3 (three) times daily as needed (pain).  . MELATONIN PO Take by mouth.    There were no vitals taken for this visit.  Physical Exam Appearance well-developed  well-nourished male grooming hygiene acceptable poor dentition Oriented 3 Mood normal affect normal Gait and station unremarkable no limp  Ortho Exam Left shoulder anterior scar crepitance but flexion is 120 and there is no weakness  Right shoulder prior incision from 30 year ago surgery he has full range of motion normal strength mild pain with impingement sign no instability skin is otherwise normal good pulses no sensory loss and normal lymph nodes   ASSESSMENT: My personal interpretation of the images:  X-rays show proximal migration of the humeral head decreased acromiohumeral head distance suggestive of chronic rotator cuff disease    PLAN  Procedure note the subacromial injection shoulder RIGHT  Verbal consent was obtained to inject the  RIGHT   Shoulder  Timeout was completed to confirm the injection site is a subacromial space of the  RIGHT  shoulder   Medication used Depo-Medrol 40 mg and lidocaine 1% 3 cc  Anesthesia was provided by ethyl chloride  The injection was performed in the RIGHT  posterior subacromial space. After pinning the skin with alcohol and anesthetized the skin with ethyl chloride the subacromial space was injected using a 20-gauge needle. There were no complications  Sterile dressing was applied.    Arther Abbott, MD 10/26/2015 3:19 PM  .meds

## 2015-12-07 ENCOUNTER — Other Ambulatory Visit (HOSPITAL_COMMUNITY): Payer: Self-pay | Admitting: Internal Medicine

## 2015-12-07 ENCOUNTER — Ambulatory Visit (HOSPITAL_COMMUNITY)
Admission: RE | Admit: 2015-12-07 | Discharge: 2015-12-07 | Disposition: A | Discharge status: Home / Self Care | Payer: Medicare Other | Source: Ambulatory Visit | Attending: Internal Medicine | Admitting: Internal Medicine

## 2015-12-07 DIAGNOSIS — R05 Cough: Secondary | ICD-10-CM | POA: Insufficient documentation

## 2015-12-07 DIAGNOSIS — R059 Cough, unspecified: Secondary | ICD-10-CM

## 2016-02-16 ENCOUNTER — Other Ambulatory Visit: Payer: Self-pay

## 2016-02-16 MED ORDER — NITROGLYCERIN 0.4 MG SL SUBL: 0.4000 mg | SUBLINGUAL_TABLET | SUBLINGUAL | 3 refills | Status: DC | PRN

## 2016-02-17 ENCOUNTER — Ambulatory Visit: Payer: Self-pay | Admitting: Physician Assistant

## 2016-02-19 ENCOUNTER — Ambulatory Visit: Payer: Self-pay | Admitting: Adult Health

## 2016-02-24 ENCOUNTER — Ambulatory Visit: Payer: Self-pay | Admitting: Physician Assistant

## 2016-03-02 ENCOUNTER — Ambulatory Visit: Payer: Self-pay | Admitting: Physician Assistant

## 2016-03-02 ENCOUNTER — Encounter: Payer: Self-pay | Admitting: Physician Assistant

## 2016-03-03 DIAGNOSIS — J449 Chronic obstructive pulmonary disease, unspecified: Secondary | ICD-10-CM | POA: Diagnosis not present

## 2016-03-03 DIAGNOSIS — N401 Enlarged prostate with lower urinary tract symptoms: Secondary | ICD-10-CM | POA: Diagnosis not present

## 2016-03-03 DIAGNOSIS — Z6832 Body mass index (BMI) 32.0-32.9, adult: Secondary | ICD-10-CM | POA: Diagnosis not present

## 2016-05-04 ENCOUNTER — Ambulatory Visit (INDEPENDENT_AMBULATORY_CARE_PROVIDER_SITE_OTHER): Payer: Medicare HMO | Admitting: Physician Assistant

## 2016-05-04 ENCOUNTER — Encounter: Payer: Self-pay | Admitting: Physician Assistant

## 2016-05-04 VITALS — BP 160/100 | HR 61 | Ht 72.5 in | Wt 217.0 lb

## 2016-05-04 DIAGNOSIS — R0789 Other chest pain: Secondary | ICD-10-CM

## 2016-05-04 DIAGNOSIS — I10 Essential (primary) hypertension: Secondary | ICD-10-CM | POA: Diagnosis not present

## 2016-05-04 DIAGNOSIS — R0600 Dyspnea, unspecified: Secondary | ICD-10-CM | POA: Insufficient documentation

## 2016-05-04 DIAGNOSIS — I2510 Atherosclerotic heart disease of native coronary artery without angina pectoris: Secondary | ICD-10-CM

## 2016-05-04 DIAGNOSIS — R0609 Other forms of dyspnea: Secondary | ICD-10-CM | POA: Diagnosis not present

## 2016-05-04 DIAGNOSIS — E785 Hyperlipidemia, unspecified: Secondary | ICD-10-CM

## 2016-05-04 DIAGNOSIS — Z720 Tobacco use: Secondary | ICD-10-CM | POA: Diagnosis not present

## 2016-05-04 MED ORDER — BENAZEPRIL HCL 20 MG PO TABS: 20.0000 mg | ORAL_TABLET | Freq: Every day | ORAL | 6 refills | Status: DC

## 2016-05-04 NOTE — Progress Notes (Signed)
Cardiology Office Note    Date:  05/04/2016   ID:  PEDER ALLUMS, DOB 12/10/1940, MRN [id number]  PCP:  Asencion Noble, MD  Cardiologist: Dr. Harl Bowie  Chief Complaint  Patient presents with  . Chest Pain  . Shortness of Breath    History of Present Illness:  Alex Lara is a 76 y.o. male  with history of CAD status post DES 2 to the LAD 06/2008 Epic notes indicate stent to the RCA at that time but it is not listed in the cath reports.He had a total occlusion of the RCA. Patient also has hypertension and hyperlipidemia. He saw Dr. Harl Bowie 03/2015 for clearance before undergoing shoulder surgery. Patient was cleared for surgery and Plavix was stopped. He recommended screening for AAA because of his age and greater than 84 years of smoking but patient never had it done.  Patient comes in today complaining of 6 month history of worsening dyspnea on exertion and feeling tightness. He he stopped all his medications along time ago. He noticed his blood pressure was up recently and had to take Lasix for fluid buildup. He is being treated for COPD exacerbation. He says when he walks any distance he gets out of work and feels like his chest gets tight because of wheezing. He has no radiation of pain into his neck or down his arm. He doesn't think it feels like when he had a stent. He says he's also struggling with depression. Last office visit meds included Lotensin 20 mg daily Coreg 12.5 mg twice a day and Zocor 40 mg daily none of which she is taking.    Past Medical History:  Diagnosis Date  . Anxiety and depression   . Arteriosclerotic cardiovascular disease (ASCVD)    06/2008: DES to the RCA and LAD  . Carpal tunnel syndrome   . Chronic anticoagulation   . Deep vein thrombosis ([place])   . Degenerative joint disease   . Gastroesophageal reflux disease   . Hyperlipidemia   . Hypertension   . Tobacco abuse     Past Surgical History:  Procedure Laterality Date  . COLONOSCOPY  ?  Date  .  INGUINAL HERNIA REPAIR    . ORIF-unknown fracture    . ROTATOR CUFF REPAIR    . TOTAL HIP ARTHROPLASTY  2004   Left    Current Medications: Outpatient Medications Prior to Visit  Medication Sig Dispense Refill  . clonazePAM (KLONOPIN) 0.5 MG tablet Take 0.5 mg by mouth 3 (three) times daily as needed for anxiety.     . Ibuprofen (ADVIL) 200 MG CAPS Take 600 mg by mouth 3 (three) times daily as needed (pain).    . MELATONIN PO Take by mouth.    . nitroGLYCERIN (NITROSTAT) 0.4 MG SL tablet Place 1 tablet (0.4 mg total) under the tongue every 5 (five) minutes as needed for chest pain. 25 tablet 3  . FLUOXETINE HCL PO Take by mouth.     No facility-administered medications prior to visit.      Allergies:   Codeine   Social History   Social History  . Marital status: Married    Spouse name: N/A  . Number of children: 3  . Years of education: 26   Social History Main Topics  . Smoking status: Current Every Day Smoker    Packs/day: 0.50    Years: 55.00  . Smokeless tobacco: None  . Alcohol use 0.0 oz/week     Comment: 1.5 bottles daily of whiskey -  2 bottles of wine daily  . Drug use: Unknown  . Sexual activity: Not Asked   Other Topics Concern  . None   Social History Narrative  . None     Family History:  The patient's   family history includes Coronary artery disease in his father and mother.   ROS:   Please see the history of present illness.    Review of Systems  Constitution: Negative.  HENT: Negative.   Cardiovascular: Positive for chest pain, dyspnea on exertion and leg swelling.  Respiratory: Positive for wheezing.   Endocrine: Negative.   Hematologic/Lymphatic: Negative.   Musculoskeletal: Negative.   Gastrointestinal: Negative.   Genitourinary: Negative.   Neurological: Negative.   Psychiatric/Behavioral: Positive for depression.   All other systems reviewed and are negative.   PHYSICAL EXAM:   VS:  BP (!) 160/100   Pulse 61   Ht 6' 0.5" (1.842  m)   Wt 217 lb (98.4 kg)   SpO2 97%   BMI 29.03 kg/m   Physical Exam  GEN: Well nourished, well developed, in no acute distress,  HEENT: most of teeth missing, dental caries Neck: no JVD, carotid bruits, or masses Cardiac:RRR; positive S4, distant heart sounds, no murmurs, rubs Respiratory:  Decreased breath sounds throughout with scattered wheezing GI: soft, nontender, nondistended, + BS Ext: without cyanosis, clubbing, or edema, Good distal pulses bilaterally Neuro:  Alert and Oriented x 3 Psych: euthymic mood, full affect  Wt Readings from Last 3 Encounters:  05/04/16 217 lb (98.4 kg)  03/24/15 218 lb (98.9 kg)  03/19/15 223 lb (101.2 kg)      Studies/Labs Reviewed:   EKG:  EKG is  ordered today.  The ekg ordered today demonstrates Sinus bradycardia 52 bpm, no acute change  Recent Labs: No results found for requested labs within last 8760 hours.   Lipid Panel    Component Value Date/Time   CHOL 156 01/12/2011 1505   TRIG 233 (H) 01/12/2011 1505   HDL 25 (L) 01/12/2011 1505   CHOLHDL 6.2 01/12/2011 1505   VLDL 47 (H) 01/12/2011 1505   LDLCALC 84 01/12/2011 1505    Additional studies/ records that were reviewed today include:  03/2012 echo Study Conclusions  Left ventricle: LVEF is approximately 60 to 65% with small region of mild basilar hypokinesis. The cavity size was normal. Wall thickness was increased in a pattern of mild LVH.              Transthoracic echocardiography.  M-mode, complete 2D, spectral Doppler, and color Doppler.  Height: Height: 182.9cm. Height: 72in.  Weight:  Weight: 94.3kg. Weight: 207.6lb.  Body mass index:  BMI: 28.2kg/m^2.  Body surface area:    BSA: 2.80m^2.  Patient status:  Outpatient.  Location:  Echo laboratory.     06/12/2008 cardiac catheterization 2010IMPRESSION:  1. Successful percutaneous coronary intervention with placement of two      overlapping signs Xience drug-eluting stents in the proximal and      mid left anterior  descending coronary artery.    RECOMMENDATIONS:  The patient should be continued on aspirin, Plavix for  at least a year.  He does have a history of bradycardia, but has been  tolerating low-dose beta-blocker at this time.  We will plan on  continuing his beta-blocker and statin therapy.  I will start him on an  ACE inhibitor secondary to his elevated blood pressure and his mild-to-  moderate ischemic cardiomyopathy.  The patient be monitored closely  today and  then we will discharged to home tomorrow.    6/1/2010ANGIOGRAPHIC FINDINGS:  1. The left main coronary artery had no evidence of disease.  2. The left anterior descending coronary artery is a large vessel that      courses to the apex and gives off a large size diagonal branch of      the midportion.  There are several moderate-to-large sized septal      perforator branches.  There is a segment in the midportion of the      vessel that has serial 80% lesions.  The first diagonal branch has      plaque disease only.  The distal LAD has minimal plaque disease of      20%.  3. Circumflex coronary artery gives off two marginal branches.  The      first marginal branch is a moderate-to-large size branch, has      minimal plaque disease only.  There is no significant disease noted      in this system.  4. The right coronary artery is a dominant vessel that has a 30%      proximal occlusion and a 100% mid occlusion.  The distal right      coronary artery posterior descending artery and posterolateral      branch fills from left-to-right collaterals.  5. Left ventricular angiogram was performed in the RAO projection and      shows segmental left ventricular dysfunction with inferobasilar      hypokinesis.  Overall ejection fraction is estimated at 35%.    IMPRESSION:  1. Double-vessel coronary artery disease.  2. Moderate segmental left ventricular dysfunction.    RECOMMENDATIONS:  The patient will be loaded with 300 mg of Plavix   today.  We will then start aspirin 325 mg once daily and Plavix 75 mg  once daily.  We will continue to hold his Coumadin and we will continue  his beta-blocker.  We will plan on performing percutaneous coronary  intervention of the mid left anterior descending coronary artery on  Friday, June 13, 2008, in the main cath lab.  The patient will be  discharged home later today if he is clinically stable after his 2 hours  of bedrest.             ASSESSMENT:    1. Chest tightness   2. Dyspnea on exertion   3. Arteriosclerotic cardiovascular disease (ASCVD)   4. Essential hypertension   5. Tobacco abuse   6. Hyperlipidemia, unspecified hyperlipidemia type      PLAN:  In order of problems listed above:   Chest tightness and dyspnea on exertion in the setting of a COPD exacerbation. The dyspnea on exertion has been going on for over 6 months. Smoking 1-1/2 packs per day before he had the COPD exacerbation but now he says he gets short of breath every time he smokes. In light of his history of CAD will need a stress test but will wait until COPD exacerbation improves. Resume aspirin. Check 2-D echo to reassess LV function. Last time was checked was 2014 and he had normal LV function.  CAD status post stenting to the LAD 2 with a total RCA on cath in 2010 now with exertional dyspnea and chest tightness. Will made exercise Myoview 1 COPD exacerbation improves  Essential hypertension patient stopped all his medications and blood pressure is running high. We'll resume Lotensin 20 mg daily. Cannot start beta blocker back with his wheezing. I'll see him  back next week to reassess blood pressure and hope that we can reinstate beta blocker eventually. 2 g sodium diet.  Tobacco abuse patient smokes 1-1/2 packs of cigarettes daily but is down to one third pack daily with current illness. Smoking cessation discussed  Hyperlipidemia we'll need to resume Zocor and check follow-up lipids in 4-6  weeks..  Medication Adjustments/Labs and Tests Ordered: Current medicines are reviewed at length with the patient today.  Concerns regarding medicines are outlined above.  Medication changes, Labs and Tests ordered today are listed in the Patient Instructions below. Patient Instructions  Your physician recommends that you schedule a follow-up appointment in: 1 Week with Ermalinda Barrios, PA-C  Your physician has recommended you make the following change in your medication:  Start Lotensin 20 mg Daily   Your physician has requested that you have an echocardiogram. Echocardiography is a painless test that uses sound waves to create images of your heart. It provides your doctor with information about the size and shape of your heart and how well your heart's chambers and valves are working. This procedure takes approximately one hour. There are no restrictions for this procedure.  If you need a refill on your cardiac medications before your next appointment, please call your pharmacy.  Thank you for choosing [facility]!       Sumner Boast, PA-C  05/04/2016 11:48 AM    [facility] [place], [place], [place]  [address] Phone: [phone]; Fax: [phone]

## 2016-05-04 NOTE — Patient Instructions (Signed)
Your physician recommends that you schedule a follow-up appointment in: 1 Week with Ermalinda Barrios, PA-C  Your physician has recommended you make the following change in your medication:  Start Lotensin 20 mg Daily   Your physician has requested that you have an echocardiogram. Echocardiography is a painless test that uses sound waves to create images of your heart. It provides your doctor with information about the size and shape of your heart and how well your heart's chambers and valves are working. This procedure takes approximately one hour. There are no restrictions for this procedure.  If you need a refill on your cardiac medications before your next appointment, please call your pharmacy.  Thank you for choosing [facility]!

## 2016-05-09 ENCOUNTER — Ambulatory Visit (HOSPITAL_COMMUNITY)
Admission: RE | Admit: 2016-05-09 | Discharge: 2016-05-09 | Disposition: A | Discharge status: Home / Self Care | Payer: Medicare HMO | Source: Ambulatory Visit | Attending: Physician Assistant | Admitting: Physician Assistant

## 2016-05-09 DIAGNOSIS — K219 Gastro-esophageal reflux disease without esophagitis: Secondary | ICD-10-CM | POA: Insufficient documentation

## 2016-05-09 DIAGNOSIS — E785 Hyperlipidemia, unspecified: Secondary | ICD-10-CM | POA: Diagnosis not present

## 2016-05-09 DIAGNOSIS — Z720 Tobacco use: Secondary | ICD-10-CM | POA: Diagnosis not present

## 2016-05-09 DIAGNOSIS — I10 Essential (primary) hypertension: Secondary | ICD-10-CM | POA: Insufficient documentation

## 2016-05-09 NOTE — Progress Notes (Signed)
*  PRELIMINARY RESULTS* Echocardiogram 2D Echocardiogram has been performed.  Leavy Cella 05/09/2016, 2:39 PM

## 2016-05-10 ENCOUNTER — Telehealth: Payer: Self-pay | Admitting: *Deleted

## 2016-05-10 NOTE — Telephone Encounter (Signed)
-----   Message from Imogene Burn, PA-C sent at 05/10/2016  8:04 AM EDT ----- Heart function is down from last echo and show new wall motion abnormality. Needs Exercise Stress myoview. Please make sure COPD exacerbation is better

## 2016-05-10 NOTE — Telephone Encounter (Signed)
Called patient with test results. No answer. Left message to call back.  

## 2016-05-11 ENCOUNTER — Telehealth: Payer: Self-pay | Admitting: *Deleted

## 2016-05-11 ENCOUNTER — Encounter: Payer: Self-pay | Admitting: Physician Assistant

## 2016-05-11 ENCOUNTER — Encounter: Payer: Self-pay | Admitting: *Deleted

## 2016-05-11 ENCOUNTER — Ambulatory Visit (INDEPENDENT_AMBULATORY_CARE_PROVIDER_SITE_OTHER): Payer: Medicare HMO | Admitting: Physician Assistant

## 2016-05-11 VITALS — BP 142/88 | HR 56 | Ht 72.5 in | Wt 216.0 lb

## 2016-05-11 DIAGNOSIS — E785 Hyperlipidemia, unspecified: Secondary | ICD-10-CM | POA: Diagnosis not present

## 2016-05-11 DIAGNOSIS — R0789 Other chest pain: Secondary | ICD-10-CM

## 2016-05-11 DIAGNOSIS — I10 Essential (primary) hypertension: Secondary | ICD-10-CM

## 2016-05-11 DIAGNOSIS — I2510 Atherosclerotic heart disease of native coronary artery without angina pectoris: Secondary | ICD-10-CM

## 2016-05-11 DIAGNOSIS — Z720 Tobacco use: Secondary | ICD-10-CM

## 2016-05-11 MED ORDER — BENAZEPRIL HCL 40 MG PO TABS: 40.0000 mg | ORAL_TABLET | Freq: Every day | ORAL | 6 refills | Status: DC

## 2016-05-11 NOTE — Telephone Encounter (Signed)
Orders placed. Letter to be given at appt. Today.

## 2016-05-11 NOTE — Patient Instructions (Signed)
Your physician recommends that you schedule a follow-up appointment in: 2-3 Weeks with Dr. Harl Bowie   Your physician has recommended you make the following change in your medication:   Increase Lotensin to 40 mg Daily   Your physician has requested that you have en exercise stress myoview. For further information please visit [email or web address]. Please follow instruction sheet, as given.    If you need a refill on your cardiac medications before your next appointment, please call your pharmacy.  Thank you for choosing [facility]!

## 2016-05-11 NOTE — Progress Notes (Signed)
Cardiology Office Note    Date:  05/11/2016   ID:  Alex Lara, DOB 1940-02-23, MRN [id number]  PCP:  Asencion Noble, MD  Cardiologist: Dr. Harl Bowie   No chief complaint on file.   History of Present Illness:  Alex Lara is a 76 y.o. male with history of CAD status post DES 2 to the LAD 06/2008 Epic notes indicate stent to the RCA at that time but it is not listed in the cath reports.He had a total occlusion of the RCA. Patient also has hypertension and hyperlipidemia. He saw Dr. Harl Bowie 03/2015 for clearance before undergoing shoulder surgery. Patient was cleared for surgery and Plavix was stopped. He recommended screening for AAA because of his age and greater than 66 years of smoking but patient never had it done.   I saw him on 05/04/16 complaining of 6 month history of worsening dyspnea on exertion and chest tightness. He had stopped all his medications. He was also being treated for COPD exacerbation. I resumed his Lotensin 20 mg daily, aspirin, and Zocor. I did not resume his beta blocker because he was wheezing that day. I ordered a 2-D echo that showed mildly reduced LV function EF 45-50% with grade 1 DD, hypokinesis of the basal inferior wall, possible hypokinesis of the mid anterior lateral and apical lateral wall. This is new. Nuclear stress test is scheduled.  Patient is here for two-week follow-up. His blood pressure is better on the current dose of Lotensin. He doesn't know whether he is taking Zocor not. He only complains of sharp shooting chest pains on occasion that are fleeting. He denies any chest tightness at this time. He has chronic dyspnea on exertion. He doesn't like to take Lasix because of severe leg cramps.    Past Medical History:  Diagnosis Date  . Anxiety and depression   . Arteriosclerotic cardiovascular disease (ASCVD)    06/2008: DES to the RCA and LAD  . Carpal tunnel syndrome   . Chronic anticoagulation   . Deep vein thrombosis ([place])   . Degenerative  joint disease   . Gastroesophageal reflux disease   . Hyperlipidemia   . Hypertension   . Tobacco abuse     Past Surgical History:  Procedure Laterality Date  . COLONOSCOPY  ?  Date  . INGUINAL HERNIA REPAIR    . ORIF-unknown fracture    . ROTATOR CUFF REPAIR    . TOTAL HIP ARTHROPLASTY  2004   Left    Current Medications: Outpatient Medications Prior to Visit  Medication Sig Dispense Refill  . acetaminophen (TYLENOL) 650 MG CR tablet Take 650 mg by mouth every 8 (eight) hours as needed for pain.    . benazepril (LOTENSIN) 20 MG tablet Take 1 tablet (20 mg total) by mouth daily. 30 tablet 6  . clonazePAM (KLONOPIN) 0.5 MG tablet Take 0.5 mg by mouth 3 (three) times daily as needed for anxiety.     . FLUOXETINE HCL PO Take by mouth.    . furosemide (LASIX) 40 MG tablet     . Ibuprofen (ADVIL) 200 MG CAPS Take 600 mg by mouth 3 (three) times daily as needed (pain).    . MELATONIN PO Take by mouth.    . nitroGLYCERIN (NITROSTAT) 0.4 MG SL tablet Place 1 tablet (0.4 mg total) under the tongue every 5 (five) minutes as needed for chest pain. 25 tablet 3   No facility-administered medications prior to visit.      Allergies:   Codeine  Social History   Social History  . Marital status: Married    Spouse name: N/A  . Number of children: 3  . Years of education: 53   Social History Main Topics  . Smoking status: Current Every Day Smoker    Packs/day: 1.50    Years: 55.00  . Smokeless tobacco: Never Used  . Alcohol use 0.0 oz/week     Comment: 1.5 bottles daily of whiskey - 2 bottles of wine daily  . Drug use: Unknown  . Sexual activity: Not Asked   Other Topics Concern  . None   Social History Narrative  . None     Family History:  The patient's family history includes Coronary artery disease in his father and mother.   ROS:   Please see the history of present illness.    Review of Systems  Constitution: Negative.  HENT: Negative.   Cardiovascular: Positive  for chest pain and dyspnea on exertion.  Respiratory: Positive for cough, snoring and wheezing.   Endocrine: Negative.   Hematologic/Lymphatic: Negative.   Musculoskeletal: Negative.   Gastrointestinal: Negative.   Genitourinary: Negative.   Neurological: Negative.   Psychiatric/Behavioral: The patient has insomnia.    All other systems reviewed and are negative.   PHYSICAL EXAM:   VS:  BP (!) 142/88   Pulse (!) 56   Ht 6' 0.5" (1.842 m)   Wt 216 lb (98 kg)   SpO2 97%   BMI 28.89 kg/m   Physical Exam  GEN: Obese, in no acute distress  Neck: no JVD, carotid bruits, or masses Cardiac:RRR; positive S4 no murmurs, rubs Respiratory:  Decreased breath sounds throughout but no wheezing today GI: soft, nontender, nondistended, + BS Ext: without cyanosis, clubbing, or edema, Good distal pulses bilaterally Psych: euthymic mood, full affect  Wt Readings from Last 3 Encounters:  05/11/16 216 lb (98 kg)  05/04/16 217 lb (98.4 kg)  03/24/15 218 lb (98.9 kg)      Studies/Labs Reviewed:   EKG:  EKG is not ordered today.  Recent Labs: No results found for requested labs within last 8760 hours.   Lipid Panel    Component Value Date/Time   CHOL 156 01/12/2011 1505   TRIG 233 (H) 01/12/2011 1505   HDL 25 (L) 01/12/2011 1505   CHOLHDL 6.2 01/12/2011 1505   VLDL 47 (H) 01/12/2011 1505   LDLCALC 84 01/12/2011 1505    Additional studies/ records that were reviewed today include:  03/2012 echo Study Conclusions  Left ventricle: LVEF is approximately 60 to 65% with small region of mild basilar hypokinesis. The cavity size was normal. Wall thickness was increased in a pattern of mild LVH.              Transthoracic echocardiography.  M-mode, complete 2D, spectral Doppler, and color Doppler.  Height: Height: 182.9cm. Height: 72in.  Weight:  Weight: 94.3kg. Weight: 207.6lb.  Body mass index:  BMI: 28.2kg/m^2.  Body surface area:    BSA: 2.74m^2.  Patient status:  Outpatient.   Location:  Echo laboratory.     06/12/2008 cardiac catheterization 2010IMPRESSION:  1. Successful percutaneous coronary intervention with placement of two      overlapping signs Xience drug-eluting stents in the proximal and      mid left anterior descending coronary artery.    RECOMMENDATIONS:  The patient should be continued on aspirin, Plavix for  at least a year.  He does have a history of bradycardia, but has been  tolerating low-dose beta-blocker at  this time.  We will plan on  continuing his beta-blocker and statin therapy.  I will start him on an  ACE inhibitor secondary to his elevated blood pressure and his mild-to-  moderate ischemic cardiomyopathy.  The patient be monitored closely  today and then we will discharged to home tomorrow.   6/1/2010ANGIOGRAPHIC FINDINGS:  1. The left main coronary artery had no evidence of disease.  2. The left anterior descending coronary artery is a large vessel that      courses to the apex and gives off a large size diagonal branch of      the midportion.  There are several moderate-to-large sized septal      perforator branches.  There is a segment in the midportion of the      vessel that has serial 80% lesions.  The first diagonal branch has      plaque disease only.  The distal LAD has minimal plaque disease of      20%.  3. Circumflex coronary artery gives off two marginal branches.  The      first marginal branch is a moderate-to-large size branch, has      minimal plaque disease only.  There is no significant disease noted      in this system.  4. The right coronary artery is a dominant vessel that has a 30%      proximal occlusion and a 100% mid occlusion.  The distal right      coronary artery posterior descending artery and posterolateral      branch fills from left-to-right collaterals.  5. Left ventricular angiogram was performed in the RAO projection and      shows segmental left ventricular dysfunction with inferobasilar       hypokinesis.  Overall ejection fraction is estimated at 35%.    IMPRESSION:  1. Double-vessel coronary artery disease.  2. Moderate segmental left ventricular dysfunction.    RECOMMENDATIONS:  The patient will be loaded with 300 mg of Plavix  today.  We will then start aspirin 325 mg once daily and Plavix 75 mg  once daily.  We will continue to hold his Coumadin and we will continue  his beta-blocker.  We will plan on performing percutaneous coronary  intervention of the mid left anterior descending coronary artery on  Friday, June 13, 2008, in the main cath lab.  The patient will be  discharged home later today if he is clinically stable after his 2 hours  of bedrest.     2-D echo 05/09/16  Study Conclusions   - Left ventricle: The cavity size was normal. Wall thickness was   increased in a pattern of mild LVH. Systolic function was mildly   reduced. The estimated ejection fraction was in the range of 45%   to 50%. Doppler parameters are consistent with abnormal left   ventricular relaxation (grade 1 diastolic dysfunction). - Regional wall motion abnormality: Hypokinesis of the basal   inferior myocardium; possible hypokinesis of the mid   anterolateral and apical lateral myocardium. - Aortic valve: Trileaflet; mildly thickened leaflets. Sclerosis   without stenosis. - Aorta: Mild aortic root dilatation. Aortic root dimension: 41 mm   (ED). - Right ventricle: Systolic function was mildly reduced.          ASSESSMENT:    1. Arteriosclerotic cardiovascular disease (ASCVD)   2. Essential hypertension   3. Tobacco abuse   4. Hyperlipidemia, unspecified hyperlipidemia type      PLAN:  In order  of problems listed above:  CAD with hx of exertional chest pain and dyspnea on exertion but now with only sharp shooting chest pain and DOE. 2-D echo with new LV dysfunction EF 45-50% with wall motion abnormalities as described above. Patient is scheduled for exercise Myoview May 8.  He had stopped all his medications but he is back on lisinopril, aspirin and Zocor. Will not restart metoprolol this time with his COPD. Not wheezing today so can consider in the future. Follow-up with Dr. Harl Bowie in 2-3 weeks.  Essential hypertension blood pressure better but still elevated. Will increase Lotensin to 40 mg daily  Tobacco abuse smoking cessation recommended  Hyperlipidemia Zocor was supposed to be resumed but he doesn't think he is taking it. I've asked him to resume this. He will then need  follow-up lipids in 4-6 weeks.    Medication Adjustments/Labs and Tests Ordered: Current medicines are reviewed at length with the patient today.  Concerns regarding medicines are outlined above.  Medication changes, Labs and Tests ordered today are listed in the Patient Instructions below. There are no Patient Instructions on file for this visit.   Signed, Ermalinda Barrios, PA-C  05/11/2016 2:12 PM    [facility] [address], [place], [place]  [address] Phone: [phone]; Fax: [phone]

## 2016-05-11 NOTE — Telephone Encounter (Signed)
-----   Message from Imogene Burn, PA-C sent at 05/10/2016  8:04 AM EDT ----- Heart function is down from last echo and show new wall motion abnormality. Needs Exercise Stress myoview. Please make sure COPD exacerbation is better

## 2016-05-11 NOTE — Addendum Note (Signed)
Addended by: Levonne Hubert on: 05/11/2016 02:30 PM   Modules accepted: Orders

## 2016-05-17 ENCOUNTER — Encounter (HOSPITAL_BASED_OUTPATIENT_CLINIC_OR_DEPARTMENT_OTHER)
Admission: RE | Admit: 2016-05-17 | Discharge: 2016-05-17 | Disposition: A | Discharge status: Home / Self Care | Payer: Medicare HMO | Source: Ambulatory Visit | Attending: Physician Assistant | Admitting: Physician Assistant

## 2016-05-17 ENCOUNTER — Encounter (HOSPITAL_COMMUNITY)
Admission: RE | Admit: 2016-05-17 | Discharge: 2016-05-17 | Disposition: A | Discharge status: Home / Self Care | Payer: Medicare HMO | Source: Ambulatory Visit | Attending: Physician Assistant | Admitting: Physician Assistant

## 2016-05-17 ENCOUNTER — Encounter (HOSPITAL_COMMUNITY): Payer: Self-pay

## 2016-05-17 DIAGNOSIS — I2510 Atherosclerotic heart disease of native coronary artery without angina pectoris: Secondary | ICD-10-CM | POA: Insufficient documentation

## 2016-05-17 DIAGNOSIS — R0789 Other chest pain: Secondary | ICD-10-CM | POA: Insufficient documentation

## 2016-05-17 DIAGNOSIS — R9439 Abnormal result of other cardiovascular function study: Secondary | ICD-10-CM | POA: Diagnosis not present

## 2016-05-17 DIAGNOSIS — I10 Essential (primary) hypertension: Secondary | ICD-10-CM | POA: Diagnosis not present

## 2016-05-17 LAB — NM MYOCAR MULTI W/SPECT W/WALL MOTION / EF
CHL CUP RESTING HR STRESS: 55 {beats}/min
CHL RATE OF PERCEIVED EXERTION: 13
CSEPEDS: 10 s
Estimated workload: 4.9 METS
Exercise duration (min): 3 min
LVDIAVOL: 126 mL (ref 62–150)
LVSYSVOL: 68 mL
MPHR: 146 {beats}/min
NUC STRESS TID: 0.99
Peak HR: 114 {beats}/min
Percent HR: 78 %
RATE: 0.32
SDS: 3
SRS: 2
SSS: 5

## 2016-05-17 MED ORDER — REGADENOSON 0.4 MG/5ML IV SOLN
INTRAVENOUS | Status: AC
  Administered 2016-05-17: 0.4 mg via INTRAVENOUS
  Filled 2016-05-17: qty 5

## 2016-05-17 MED ORDER — SODIUM CHLORIDE 0.9% FLUSH
INTRAVENOUS | Status: AC
  Administered 2016-05-17: 10 mL via INTRAVENOUS
  Filled 2016-05-17: qty 10

## 2016-05-17 MED ORDER — TECHNETIUM TC 99M TETROFOSMIN IV KIT
10.0000 | PACK | Freq: Once | INTRAVENOUS | Status: AC | PRN
  Administered 2016-05-17: 10.3 via INTRAVENOUS

## 2016-05-17 MED ORDER — TECHNETIUM TC 99M TETROFOSMIN IV KIT
30.0000 | PACK | Freq: Once | INTRAVENOUS | Status: AC | PRN
  Administered 2016-05-17: 31 via INTRAVENOUS

## 2016-05-19 ENCOUNTER — Telehealth: Payer: Self-pay | Admitting: *Deleted

## 2016-05-19 NOTE — Telephone Encounter (Signed)
Called patient with test results. No answer. Left message to call back.  

## 2016-05-19 NOTE — Telephone Encounter (Signed)
-----   Message from Imogene Burn, PA-C sent at 05/18/2016  9:55 AM EDT ----- Intermediate risk stress test.  Wasn't having angina when I saw him last. Patient can review symptoms and stress test with Dr. Harl Bowie on 5/24. Call if recurrent chest pain

## 2016-05-20 ENCOUNTER — Telehealth: Payer: Self-pay | Admitting: *Deleted

## 2016-05-20 NOTE — Telephone Encounter (Signed)
-----   Message from Alex Burn, PA-C sent at 05/18/2016  9:55 AM EDT ----- Intermediate risk stress test.  Wasn't having angina when I saw him last. Patient can review symptoms and stress test with Dr. Harl Bowie on 5/24. Call if recurrent chest pain

## 2016-05-20 NOTE — Telephone Encounter (Signed)
Called patient with test results. No answer. Left message to call back.  

## 2016-06-02 ENCOUNTER — Ambulatory Visit (INDEPENDENT_AMBULATORY_CARE_PROVIDER_SITE_OTHER): Payer: Medicare HMO | Admitting: Cardiology

## 2016-06-02 ENCOUNTER — Encounter: Payer: Self-pay | Admitting: Cardiology

## 2016-06-02 VITALS — BP 124/78 | HR 60 | Ht 72.5 in | Wt 212.0 lb

## 2016-06-02 DIAGNOSIS — R0602 Shortness of breath: Secondary | ICD-10-CM | POA: Diagnosis not present

## 2016-06-02 DIAGNOSIS — I10 Essential (primary) hypertension: Secondary | ICD-10-CM

## 2016-06-02 DIAGNOSIS — I2510 Atherosclerotic heart disease of native coronary artery without angina pectoris: Secondary | ICD-10-CM | POA: Diagnosis not present

## 2016-06-02 DIAGNOSIS — N183 Chronic kidney disease, stage 3 (moderate): Secondary | ICD-10-CM | POA: Diagnosis not present

## 2016-06-02 NOTE — Progress Notes (Signed)
Clinical Summary Mr. Dowson is a 76 y.o.male seen today for follow up of the following medical problems.   1. CAD - history of  DES x 2 to LAD in 06/2008. Documented PMH on epic reports stent to RCA at that time as well however I do not see this indicated in any cath reports - from prior notes mixed history of medication compliance.    - recent troubles with dysnpnea and chest pain. He had stopped his meds at the time, also was being treatd for COPD exacerbation - 04/2016 echo LVEF 45-50%, grade I diastolci dysfunction, Hypokinesis of the basal   inferior myocardium; possible hypokinesis of the mid anterolateral and apical lateral myocardium. - 05/2016 lexiscan MPI inferior scar with mild to moderate peri-infarct ischemia.    - one or two episodes since last visit.Dull pain left chest, 2/10 in severity. Lasts just a few seconds, can be positional. Often occurs with laying down.  - SOB is variable. Can sometimes walk up 200 yards to his shop, some days not. Has had some nasal congestion, + cough that's can be productive.   2. HTN - he remains compliant with meds  3. Hyperlipidemia - he stopped simvastatin on his own. He states it caused him to feel very fatigued.  - he was to try pravastatin at last visit, currently not taking.    4. COPD - he reports being told in the past of diagnosis - inhalers have been too expensive.    Past Medical History:  Diagnosis Date  . Anxiety and depression   . Arteriosclerotic cardiovascular disease (ASCVD)    06/2008: DES to the RCA and LAD  . Carpal tunnel syndrome   . Chronic anticoagulation   . Deep vein thrombosis ([place])   . Degenerative joint disease   . Gastroesophageal reflux disease   . Hyperlipidemia   . Hypertension   . Tobacco abuse      Allergies  Allergen Reactions  . Codeine Nausea And Vomiting and Other (See Comments)    "go fuzzy"     Current Outpatient Prescriptions  Medication Sig Dispense Refill  .  acetaminophen (TYLENOL) 650 MG CR tablet Take 650 mg by mouth every 8 (eight) hours as needed for pain.    . benazepril (LOTENSIN) 40 MG tablet Take 1 tablet (40 mg total) by mouth daily. 30 tablet 6  . clonazePAM (KLONOPIN) 0.5 MG tablet Take 0.5 mg by mouth 3 (three) times daily as needed for anxiety.     . FLUOXETINE HCL PO Take by mouth.    . furosemide (LASIX) 40 MG tablet     . Ibuprofen (ADVIL) 200 MG CAPS Take 600 mg by mouth 3 (three) times daily as needed (pain).    . MELATONIN PO Take by mouth.    . nitroGLYCERIN (NITROSTAT) 0.4 MG SL tablet Place 1 tablet (0.4 mg total) under the tongue every 5 (five) minutes as needed for chest pain. 25 tablet 3   No current facility-administered medications for this visit.      Past Surgical History:  Procedure Laterality Date  . COLONOSCOPY  ?  Date  . INGUINAL HERNIA REPAIR    . ORIF-unknown fracture    . ROTATOR CUFF REPAIR    . TOTAL HIP ARTHROPLASTY  2004   Left     Allergies  Allergen Reactions  . Codeine Nausea And Vomiting and Other (See Comments)    "go fuzzy"      Family History  Problem Relation  Age of Onset  . Coronary artery disease Father   . Coronary artery disease Mother        died post-CABG     Social History Mr. Grell reports that he has been smoking.  He has a 82.50 pack-year smoking history. He has never used smokeless tobacco. Mr. Rajan reports that he drinks alcohol.   Review of Systems CONSTITUTIONAL: No weight loss, fever, chills, weakness or fatigue.  HEENT: Eyes: No visual loss, blurred vision, double vision or yellow sclerae.No hearing loss, sneezing, congestion, runny nose or sore throat.  SKIN: No rash or itching.  CARDIOVASCULAR: per hpi RESPIRATORY: No shortness of breath, cough or sputum.  GASTROINTESTINAL: No anorexia, nausea, vomiting or diarrhea. No abdominal pain or blood.  GENITOURINARY: No burning on urination, no polyuria NEUROLOGICAL: No headache, dizziness, syncope,  paralysis, ataxia, numbness or tingling in the extremities. No change in bowel or bladder control.  MUSCULOSKELETAL: No muscle, back pain, joint pain or stiffness.  LYMPHATICS: No enlarged nodes. No history of splenectomy.  PSYCHIATRIC: No history of depression or anxiety.  ENDOCRINOLOGIC: No reports of sweating, cold or heat intolerance. No polyuria or polydipsia.  Marland Kitchen   Physical Examination Vitals:   06/02/16 1253  BP: 124/78  Pulse: 60   Vitals:   06/02/16 1253  Weight: 212 lb (96.2 kg)  Height: 6' 0.5" (1.842 m)    Gen: resting comfortably, no acute distress HEENT: no scleral icterus, pupils equal round and reactive, no palptable cervical adenopathy,  CV: RRR, no m/r/g, no jvd Resp: Clear to auscultation bilaterally GI: abdomen is soft, non-tender, non-distended, normal bowel sounds, no hepatosplenomegaly MSK: extremities are warm, no edema.  Skin: warm, no rash Neuro:  no focal deficits Psych: appropriate affect   Diagnostic Studies 03/2012 echo Study Conclusions  Left ventricle: LVEF is approximately 60 to 65% with small region of mild basilar hypokinesis. The cavity size was normal. Wall thickness was increased in a pattern of mild LVH.       Transthoracic echocardiography. M-mode, complete 2D, spectral Doppler, and color Doppler. Height: Height: 182.9cm. Height: 72in. Weight: Weight: 94.3kg. Weight: 207.6lb. Body mass index: BMI: 28.2kg/m^2. Body surface area:  BSA: 2.25m^2. Patient status: Outpatient. Location: Echo laboratory.  04/2016 Echo Study Conclusions  - Left ventricle: The cavity size was normal. Wall thickness was   increased in a pattern of mild LVH. Systolic function was mildly   reduced. The estimated ejection fraction was in the range of 45%   to 50%. Doppler parameters are consistent with abnormal left   ventricular relaxation (grade 1 diastolic dysfunction). - Regional wall motion abnormality: Hypokinesis of the basal    inferior myocardium; possible hypokinesis of the mid   anterolateral and apical lateral myocardium. - Aortic valve: Trileaflet; mildly thickened leaflets. Sclerosis   without stenosis. - Aorta: Mild aortic root dilatation. Aortic root dimension: 41 mm   (ED). - Right ventricle: Systolic function was mildly reduced.  05/2016 Lexiscan MPI  Blood pressure demonstrated a normal response to exercise.  Changed from exercise to pharmacological stress test due to leg pains and SOB  Horizontal ST segment depression ST segment depression of 1 mm was noted after lexiscan injection in the II, III, aVF, V4, V5 and V6 leads.  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  This is an intermediate risk study.  The left ventricular ejection fraction is mildly decreased (45-54%).  Assessment and Plan   1. CAD - no recent exertional chest pain. Suspect  his SOB is more related to COPD - we will continue current meds  2. HTN - his bp is at goal, continue current meds   3. SOB - we will obtain PFTs to verify diagnosis of COPD and severity - will need affordable inhalers if started, cost was a prior issue   F/u 3 weeks    Arnoldo Lenis, M.D.

## 2016-06-02 NOTE — Patient Instructions (Signed)
Medication Instructions:  START ASPIRIN 81 MG DAILY   Labwork: NONE  Testing/Procedures: Your physician has recommended that you have a pulmonary function test. Pulmonary Function Tests are a group of tests that measure how well air moves in and out of your lungs.    Follow-Up: Your physician recommends that you schedule a follow-up appointment in: 3 WEEKS    Any Other Special Instructions Will Be Listed Below (If Applicable).     If you need a refill on your cardiac medications before your next appointment, please call your pharmacy.

## 2016-06-08 ENCOUNTER — Ambulatory Visit (HOSPITAL_COMMUNITY)
Admission: RE | Admit: 2016-06-08 | Discharge: 2016-06-08 | Disposition: A | Discharge status: Home / Self Care | Payer: Medicare HMO | Source: Ambulatory Visit | Attending: Cardiology | Admitting: Cardiology

## 2016-06-08 DIAGNOSIS — R0602 Shortness of breath: Secondary | ICD-10-CM | POA: Diagnosis not present

## 2016-06-08 LAB — PULMONARY FUNCTION TEST
DL/VA % PRED: 51 %
DL/VA: 2.45 ml/min/mmHg/L
DLCO COR % PRED: 47 %
DLCO COR: 17.13 ml/min/mmHg
DLCO unc % pred: 47 %
DLCO unc: 17.13 ml/min/mmHg
FEF 25-75 POST: 1.31 L/s
FEF 25-75 Pre: 0.89 L/sec
FEF2575-%CHANGE-POST: 46 %
FEF2575-%PRED-PRE: 36 %
FEF2575-%Pred-Post: 53 %
FEV1-%CHANGE-POST: 8 %
FEV1-%Pred-Post: 61 %
FEV1-%Pred-Pre: 56 %
FEV1-Post: 2.1 L
FEV1-Pre: 1.93 L
FEV1FVC-%CHANGE-POST: 2 %
FEV1FVC-%Pred-Pre: 87 %
FEV6-%Change-Post: 5 %
FEV6-%PRED-PRE: 67 %
FEV6-%Pred-Post: 71 %
FEV6-POST: 3.16 L
FEV6-Pre: 2.98 L
FEV6FVC-%Change-Post: 0 %
FEV6FVC-%PRED-POST: 105 %
FEV6FVC-%Pred-Pre: 104 %
FVC-%CHANGE-POST: 5 %
FVC-%PRED-POST: 68 %
FVC-%PRED-PRE: 64 %
FVC-POST: 3.2 L
FVC-PRE: 3.02 L
POST FEV6/FVC RATIO: 99 %
PRE FEV1/FVC RATIO: 64 %
PRE FEV6/FVC RATIO: 99 %
Post FEV1/FVC ratio: 66 %
RV % pred: 140 %
RV: 3.79 L
TLC % pred: 89 %
TLC: 6.79 L

## 2016-06-08 MED ORDER — ALBUTEROL SULFATE (2.5 MG/3ML) 0.083% IN NEBU
2.5000 mg | INHALATION_SOLUTION | Freq: Once | RESPIRATORY_TRACT | Status: AC
Start: 2016-06-08 — End: 2016-06-08
  Administered 2016-06-08: 2.5 mg via RESPIRATORY_TRACT

## 2016-06-09 DIAGNOSIS — K279 Peptic ulcer, site unspecified, unspecified as acute or chronic, without hemorrhage or perforation: Secondary | ICD-10-CM | POA: Diagnosis not present

## 2016-06-14 ENCOUNTER — Telehealth: Payer: Self-pay

## 2016-06-14 NOTE — Telephone Encounter (Signed)
-----   Message from Acquanetta Chain, LPN sent at 09/16/5881  1:55 PM EDT -----   ----- Message ----- From: Arnoldo Lenis, MD Sent: 06/13/2016   1:51 PM To: Acquanetta Chain, LPN  Breathing tests show moderate COPD. He should be started on some inhalers, I know cost was an issue before. What type of insurance does he have? Does he have the info on there prescription plan regarding favored meds   Zandra Abts MD

## 2016-06-14 NOTE — Telephone Encounter (Signed)
Called pt no answer. Left message for pt to return call.

## 2016-06-15 ENCOUNTER — Telehealth: Payer: Self-pay

## 2016-06-15 NOTE — Telephone Encounter (Signed)
Spoke with patient about his results. He still does not have insurance to cover inhalers to help his COPD. He stated he will discuss it further with you next week at your appointment.

## 2016-06-15 NOTE — Telephone Encounter (Signed)
-----   Message from Acquanetta Chain, LPN sent at 01/13/7090  1:55 PM EDT -----   ----- Message ----- From: Arnoldo Lenis, MD Sent: 06/13/2016   1:51 PM To: Acquanetta Chain, LPN  Breathing tests show moderate COPD. He should be started on some inhalers, I know cost was an issue before. What type of insurance does he have? Does he have the info on there prescription plan regarding favored meds   Zandra Abts MD

## 2016-06-21 ENCOUNTER — Encounter: Payer: Self-pay | Admitting: Orthopedic Surgery

## 2016-06-21 ENCOUNTER — Ambulatory Visit (INDEPENDENT_AMBULATORY_CARE_PROVIDER_SITE_OTHER): Payer: Medicare HMO

## 2016-06-21 ENCOUNTER — Ambulatory Visit (INDEPENDENT_AMBULATORY_CARE_PROVIDER_SITE_OTHER): Payer: Medicare HMO | Admitting: Orthopedic Surgery

## 2016-06-21 VITALS — BP 147/96 | HR 61 | Wt 215.0 lb

## 2016-06-21 DIAGNOSIS — M25569 Pain in unspecified knee: Secondary | ICD-10-CM | POA: Diagnosis not present

## 2016-06-21 DIAGNOSIS — M170 Bilateral primary osteoarthritis of knee: Secondary | ICD-10-CM | POA: Diagnosis not present

## 2016-06-21 NOTE — Progress Notes (Signed)
NEW PROBLEM/OFFICE VISIT    Chief Complaint  Patient presents with  . New Problem    Bilateral Knee Pain    76 year old male had a left femoral nailing presents with bilateral dull constant anterior bilateral aching knee pain new-onset no trauma    Review of Systems  Constitutional: Negative for fever.  Respiratory: Negative for shortness of breath.   Cardiovascular: Negative for chest pain.  Neurological: Negative for tingling.     Past Medical History:  Diagnosis Date  . Anxiety and depression   . Arteriosclerotic cardiovascular disease (ASCVD)    06/2008: DES to the RCA and LAD  . Blood clotting disorder ([place])   . Carpal tunnel syndrome   . Chronic anticoagulation   . Deep vein thrombosis ([place])   . Degenerative joint disease   . Gastroesophageal reflux disease   . Hyperlipidemia   . Hypertension   . Tobacco abuse     Past Surgical History:  Procedure Laterality Date  . COLONOSCOPY  ?  Date  . INGUINAL HERNIA REPAIR    . ORIF-unknown fracture    . ROTATOR CUFF REPAIR    . TOTAL HIP ARTHROPLASTY  2004   Left    Family History  Problem Relation Age of Onset  . Coronary artery disease Father   . Coronary artery disease Mother        died post-CABG  . Heart disease Mother    Social History  Substance Use Topics  . Smoking status: Current Every Day Smoker    Packs/day: 1.50    Years: 55.00  . Smokeless tobacco: Never Used  . Alcohol use 0.0 oz/week     Comment: 1.5 bottles daily of whiskey - 2 bottles of wine daily    BP (!) 147/96   Pulse 61   Wt 215 lb (97.5 kg)   BMI 28.76 kg/m   Physical Exam  Constitutional: He appears well-developed and well-nourished.  Vital signs have been reviewed and are stable. Gen. appearance the patient is well-developed and well-nourished with normal grooming and hygiene. The patient is oriented 3 with normal mood and affect.  Neurological: Gait normal.  Vitals reviewed.   Right Knee Exam   Tenderness  The  patient is experiencing tenderness in the medial joint line.  Range of Motion  Extension:  -5 normal  Flexion: normal Right knee flexion: 125.   Muscle Strength   The patient has normal right knee strength.  Tests  Mc[place]ray:  Lateral - negative Drawer:       Anterior - negative    Posterior - negative Varus: negative Valgus: negative  Other  Erythema: absent Scars: absent Sensation: normal Pulse: present Swelling: none   Left Knee Exam   Tenderness  The patient is experiencing tenderness in the medial joint line.  Range of Motion  Extension:  -5 normal  Flexion: normal Left knee flexion: 125.   Muscle Strength   The patient has normal left knee strength.  Tests  McMurray:  Lateral - negative Drawer:       Anterior - negative     Posterior - negative Varus: negative Valgus: negative  Other  Erythema: absent Scars: absent Sensation: normal Pulse: present Swelling: none      Meds ordered this encounter  Medications  . pantoprazole (PROTONIX) 40 MG tablet    Encounter Diagnoses  Name Primary?  . Knee pain, unspecified chronicity, unspecified laterality Yes  . Primary osteoarthritis of both knees      PLAN:  Procedure note left knee injection verbal consent was obtained to inject left knee joint  Timeout was completed to confirm the site of injection  The medications used were 40 mg of Depo-Medrol and 1% lidocaine 3 cc  Anesthesia was provided by ethyl chloride and the skin was prepped with alcohol.  After cleaning the skin with alcohol a 20-gauge needle was used to inject the left knee joint. There were no complications. A sterile bandage was applied.   Procedure note right knee injection verbal consent was obtained to inject right knee joint  Timeout was completed to confirm the site of injection  The medications used were 40 mg of Depo-Medrol and 1% lidocaine 3 cc  Anesthesia was provided by ethyl chloride and the skin was prepped  with alcohol.  After cleaning the skin with alcohol a 20-gauge needle was used to inject the right knee joint. There were no complications. A sterile bandage was applied.  He will come back if he does not improve

## 2016-06-23 ENCOUNTER — Encounter: Payer: Self-pay | Admitting: Cardiology

## 2016-06-23 ENCOUNTER — Ambulatory Visit (INDEPENDENT_AMBULATORY_CARE_PROVIDER_SITE_OTHER): Payer: Medicare HMO | Admitting: Cardiology

## 2016-06-23 VITALS — BP 142/76 | HR 76 | Ht 70.5 in | Wt 212.0 lb

## 2016-06-23 DIAGNOSIS — J449 Chronic obstructive pulmonary disease, unspecified: Secondary | ICD-10-CM

## 2016-06-23 DIAGNOSIS — R0602 Shortness of breath: Secondary | ICD-10-CM

## 2016-06-23 DIAGNOSIS — I2510 Atherosclerotic heart disease of native coronary artery without angina pectoris: Secondary | ICD-10-CM | POA: Diagnosis not present

## 2016-06-23 DIAGNOSIS — R0789 Other chest pain: Secondary | ICD-10-CM

## 2016-06-23 MED ORDER — METOPROLOL TARTRATE 25 MG PO TABS: 25.0000 mg | ORAL_TABLET | Freq: Two times a day (BID) | ORAL | 3 refills | Status: DC

## 2016-06-23 NOTE — Patient Instructions (Signed)
Medication Instructions:  Your physician recommends that you continue on your current medications as directed. Please refer to the Current Medication list given to you today.   Labwork: NONE  Testing/Procedures: NONE  Follow-Up: Your physician recommends that you schedule a follow-up appointment in: 6 WEEKS    Any Other Special Instructions Will Be Listed Below (If Applicable).     If you need a refill on your cardiac medications before your next appointment, please call your pharmacy.   

## 2016-06-23 NOTE — Progress Notes (Signed)
Clinical Summary Mr. Turpin is a 76 y.o.male seen today for follow a focused follow up on his history of CAD and recent chest pain.   1. CAD - history of DES x 2 to LAD in 06/2008. Documented PMH on epic reports stent to RCA at that time as well however I do not see this indicated in any cath reports - from prior notes mixed history of medication compliance.  - recent troubles with dysnpnea and chest pain. He had stopped his meds at the time, also was being treatd for COPD exacerbation - 04/2016 echo LVEF 45-50%, grade I diastolci dysfunction, Hypokinesis of the basal inferior myocardium; possible hypokinesis of the mid anterolateral and apical lateral myocardium. - 05/2016 lexiscan MPI inferior scar with mild to moderate peri-infarct ischemia.    - episodes of chest pain last night, sharp pain. 6/10 in severity. No other associated symptoms. Lasted about 5-10 minutes. Not positional. No other symptoms since last visit.   - he has had mixed compliance with his coreg.    2. COPD - 05/2016 PFTs: moderate OSA, severely reduced DLCO.  - inhalers have been too expensive in the past    Past Medical History:  Diagnosis Date  . Anxiety and depression   . Arteriosclerotic cardiovascular disease (ASCVD)    06/2008: DES to the RCA and LAD  . Blood clotting disorder ([place])   . Carpal tunnel syndrome   . Chronic anticoagulation   . Deep vein thrombosis ([place])   . Degenerative joint disease   . Gastroesophageal reflux disease   . Hyperlipidemia   . Hypertension   . Tobacco abuse      Allergies  Allergen Reactions  . Codeine Nausea And Vomiting and Other (See Comments)    "go fuzzy"     Current Outpatient Prescriptions  Medication Sig Dispense Refill  . acetaminophen (TYLENOL) 650 MG CR tablet Take 650 mg by mouth every 8 (eight) hours as needed for pain.    . benazepril (LOTENSIN) 40 MG tablet Take 1 tablet (40 mg total) by mouth daily. 30 tablet 6  . clonazePAM (KLONOPIN)  0.5 MG tablet Take 0.5 mg by mouth 3 (three) times daily as needed for anxiety.     . FLUOXETINE HCL PO Take by mouth.    . furosemide (LASIX) 40 MG tablet     . Ibuprofen (ADVIL) 200 MG CAPS Take 600 mg by mouth 3 (three) times daily as needed (pain).    . MELATONIN PO Take by mouth.    . nitroGLYCERIN (NITROSTAT) 0.4 MG SL tablet Place 1 tablet (0.4 mg total) under the tongue every 5 (five) minutes as needed for chest pain. 25 tablet 3  . pantoprazole (PROTONIX) 40 MG tablet      No current facility-administered medications for this visit.      Past Surgical History:  Procedure Laterality Date  . COLONOSCOPY  ?  Date  . INGUINAL HERNIA REPAIR    . ORIF-unknown fracture    . ROTATOR CUFF REPAIR    . TOTAL HIP ARTHROPLASTY  2004   Left     Allergies  Allergen Reactions  . Codeine Nausea And Vomiting and Other (See Comments)    "go fuzzy"      Family History  Problem Relation Age of Onset  . Coronary artery disease Father   . Coronary artery disease Mother        died post-CABG  . Heart disease Mother      Social History  Mr. Sweigert reports that he has been smoking.  He has a 82.50 pack-year smoking history. He has never used smokeless tobacco. Mr. Matheny reports that he drinks alcohol.   Review of Systems CONSTITUTIONAL: No weight loss, fever, chills, weakness or fatigue.  HEENT: Eyes: No visual loss, blurred vision, double vision or yellow sclerae.No hearing loss, sneezing, congestion, runny nose or sore throat.  SKIN: No rash or itching.  CARDIOVASCULAR: per hpi RESPIRATORY: occasional SOB GASTROINTESTINAL: No anorexia, nausea, vomiting or diarrhea. No abdominal pain or blood.  GENITOURINARY: No burning on urination, no polyuria NEUROLOGICAL: No headache, dizziness, syncope, paralysis, ataxia, numbness or tingling in the extremities. No change in bowel or bladder control.  MUSCULOSKELETAL: No muscle, back pain, joint pain or stiffness.  LYMPHATICS: No enlarged  nodes. No history of splenectomy.  PSYCHIATRIC: No history of depression or anxiety.  ENDOCRINOLOGIC: No reports of sweating, cold or heat intolerance. No polyuria or polydipsia.  Marland Kitchen   Physical Examination Vitals:   06/23/16 1253  BP: (!) 142/76  Pulse: 76   Vitals:   06/23/16 1253  Weight: 212 lb (96.2 kg)  Height: 5' 10.5" (1.791 m)    Gen: resting comfortably, no acute distress HEENT: no scleral icterus, pupils equal round and reactive, no palptable cervical adenopathy,  CV: RRR, no m/r/g, no jvd Resp: Clear to auscultation bilaterally GI: abdomen is soft, non-tender, non-distended, normal bowel sounds, no hepatosplenomegaly MSK: extremities are warm, no edema.  Skin: warm, no rash Neuro:  no focal deficits Psych: appropriate affect   Diagnostic Studies 03/2012 echo Study Conclusions  Left ventricle: LVEF is approximately 60 to 65% with small region of mild basilar hypokinesis. The cavity size was normal. Wall thickness was increased in a pattern of mild LVH.       Transthoracic echocardiography. M-mode, complete 2D, spectral Doppler, and color Doppler. Height: Height: 182.9cm. Height: 72in. Weight: Weight: 94.3kg. Weight: 207.6lb. Body mass index: BMI: 28.2kg/m^2. Body surface area:  BSA: 2.62m^2. Patient status: Outpatient. Location: Echo laboratory.  04/2016 Echo Study Conclusions  - Left ventricle: The cavity size was normal. Wall thickness was increased in a pattern of mild LVH. Systolic function was mildly reduced. The estimated ejection fraction was in the range of 45% to 50%. Doppler parameters are consistent with abnormal left ventricular relaxation (grade 1 diastolic dysfunction). - Regional wall motion abnormality: Hypokinesis of the basal inferior myocardium; possible hypokinesis of the mid anterolateral and apical lateral myocardium. - Aortic valve: Trileaflet; mildly thickened leaflets. Sclerosis without  stenosis. - Aorta: Mild aortic root dilatation. Aortic root dimension: 41 mm (ED). - Right ventricle: Systolic function was mildly reduced.  05/2016 Lexiscan MPI  Blood pressure demonstrated a normal response to exercise.  Changed from exercise to pharmacological stress test due to leg pains and SOB  Horizontal ST segment depression ST segment depression of 1 mm was noted after lexiscan injection in the II, III, aVF, V4, V5 and V6 leads.  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  This is an intermediate risk study.  The left ventricular ejection fraction is mildly decreased (45-54%).    Assessment and Plan  1. CAD - no recent exertional chest pain. Suspect his SOB is more related to COPD. Infrequent somewhat atypical chest pain - recent nuclear stress test with mild to moderate inschemia. Will treat medically initially, encouraged increased compliance with his beta blocker - we will continue current meds. If progressive symptoms with antianginal therapy and COPD management consider cath.  2. COPD - difficultly affording inhalers, he is to discuss with pcp during visit in the next few days options.    F/u 6 weeks      Arnoldo Lenis, M.D.

## 2016-06-24 ENCOUNTER — Other Ambulatory Visit: Payer: Self-pay | Admitting: Internal Medicine

## 2016-06-24 ENCOUNTER — Other Ambulatory Visit (HOSPITAL_COMMUNITY): Payer: Self-pay | Admitting: Internal Medicine

## 2016-06-24 DIAGNOSIS — R1011 Right upper quadrant pain: Secondary | ICD-10-CM | POA: Diagnosis not present

## 2016-06-24 DIAGNOSIS — R69 Illness, unspecified: Secondary | ICD-10-CM | POA: Diagnosis not present

## 2016-06-27 ENCOUNTER — Ambulatory Visit (HOSPITAL_COMMUNITY)
Admission: RE | Admit: 2016-06-27 | Discharge: 2016-06-27 | Disposition: A | Discharge status: Home / Self Care | Payer: Medicare HMO | Source: Ambulatory Visit | Attending: Internal Medicine | Admitting: Internal Medicine

## 2016-06-27 DIAGNOSIS — R1011 Right upper quadrant pain: Secondary | ICD-10-CM | POA: Insufficient documentation

## 2016-07-15 DIAGNOSIS — R358 Other polyuria: Secondary | ICD-10-CM | POA: Diagnosis not present

## 2016-08-09 ENCOUNTER — Ambulatory Visit (INDEPENDENT_AMBULATORY_CARE_PROVIDER_SITE_OTHER): Payer: Medicare HMO | Admitting: Cardiology

## 2016-08-09 ENCOUNTER — Ambulatory Visit: Payer: Self-pay | Admitting: Cardiology

## 2016-08-09 ENCOUNTER — Encounter: Payer: Self-pay | Admitting: Cardiology

## 2016-08-09 VITALS — BP 137/86 | HR 61 | Ht 70.0 in | Wt 215.0 lb

## 2016-08-09 DIAGNOSIS — I2510 Atherosclerotic heart disease of native coronary artery without angina pectoris: Secondary | ICD-10-CM

## 2016-08-09 DIAGNOSIS — R0789 Other chest pain: Secondary | ICD-10-CM

## 2016-08-09 DIAGNOSIS — Z136 Encounter for screening for cardiovascular disorders: Secondary | ICD-10-CM

## 2016-08-09 DIAGNOSIS — R0602 Shortness of breath: Secondary | ICD-10-CM

## 2016-08-09 NOTE — Progress Notes (Signed)
Clinical Summary Mr. Kissick is a 76 y.o.male seen today for follow a focused follow up on his history of CAD and recent chest pain.   1. CAD - history of DES x 2 to LAD in 06/2008. Documented PMH on epic reports stent to RCA at that time as well however I do not see this indicated in any cath reports - from prior notes mixed history of medication compliance.  - recent troubles with dysnpnea and chest pain. He had stopped his meds at the time, also was being treatd for COPD exacerbation  - 04/2016 echo LVEF 45-50%, grade I diastolci dysfunction, Hypokinesis of the basal inferior myocardium; possible hypokinesis of the mid anterolateral and apical lateral myocardium. - 05/2016 lexiscan MPI inferior scar with mild to moderate peri-infarct ischemia.    - occasional shooting pain with laying down. No exertional symptoms. Prior DOE with walking around at home has resolved.   2. COPD - 05/2016 PFTs: moderate OSA, severely reduced DLCO.  - inhalers have been too expensive in the past  - recently completed abx course, COPD and breathing significantly improved. Cough has resolved.    3. Hyperlipidemia - he stopped simvastatin on his own. He states it caused him to feel very fatigued.  - he was to try pravastatin at last visit, currently not taking.  - he reports has failed multiple statins over time  4. AAA screen - male over 80 with long smoking history   Past Medical History:  Diagnosis Date  . Anxiety and depression   . Arteriosclerotic cardiovascular disease (ASCVD)    06/2008: DES to the RCA and LAD  . 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 Carpal tunnel syndrome   . Chronic anticoagulation   . Deep vein thrombosis ([place])   . Degenerative joint disease   . Gastroesophageal reflux disease   . Hyperlipidemia   . Hypertension   . Tobacco abuse      Allergies  Allergen Reactions  . Codeine Nausea And Vomiting and Other (See Comments)    "go fuzzy"     Current  Outpatient Prescriptions  Medication Sig Dispense Refill  . acetaminophen (TYLENOL) 650 MG CR tablet Take 650 mg by mouth every 8 (eight) hours as needed for pain.    Marland Kitchen aspirin EC 81 MG tablet Take 81 mg by mouth daily.    . benazepril (LOTENSIN) 40 MG tablet Take 1 tablet (40 mg total) by mouth daily. 30 tablet 6  . clonazePAM (KLONOPIN) 0.5 MG tablet Take 0.5 mg by mouth 3 (three) times daily as needed for anxiety.     . FLUOXETINE HCL PO Take by mouth.    . furosemide (LASIX) 40 MG tablet     . Ibuprofen (ADVIL) 200 MG CAPS Take 600 mg by mouth 3 (three) times daily as needed (pain).    . MELATONIN PO Take by mouth.    . metoprolol tartrate (LOPRESSOR) 25 MG tablet Take 1 tablet (25 mg total) by mouth 2 (two) times daily. 180 tablet 3  . nitroGLYCERIN (NITROSTAT) 0.4 MG SL tablet Place 1 tablet (0.4 mg total) under the tongue every 5 (five) minutes as needed for chest pain. 25 tablet 3  . pantoprazole (PROTONIX) 40 MG tablet      No current facility-administered medications for this visit.      Past Surgical History:  Procedure Laterality Date  . COLONOSCOPY  ?  Date  . INGUINAL HERNIA REPAIR    . ORIF-unknown fracture    .  ROTATOR CUFF REPAIR    . TOTAL HIP ARTHROPLASTY  2004   Left     Allergies  Allergen Reactions  . Codeine Nausea And Vomiting and Other (See Comments)    "go fuzzy"      Family History  Problem Relation Age of Onset  . Coronary artery disease Father   . Coronary artery disease Mother        died post-CABG  . Heart disease Mother      Social History Mr. Skora reports that he has been smoking.  He has a 82.50 pack-year smoking history. He has never used smokeless tobacco. Mr. Callander reports that he drinks alcohol.   Review of Systems CONSTITUTIONAL: No weight loss, fever, chills, weakness or fatigue.  HEENT: Eyes: No visual loss, blurred vision, double vision or yellow sclerae.No hearing loss, sneezing, congestion, runny nose or sore throat.    SKIN: No rash or itching.  CARDIOVASCULAR: per hpi RESPIRATORY: per hpi GASTROINTESTINAL: No anorexia, nausea, vomiting or diarrhea. No abdominal pain or blood.  GENITOURINARY: No burning on urination, no polyuria NEUROLOGICAL: No headache, dizziness, syncope, paralysis, ataxia, numbness or tingling in the extremities. No change in bowel or bladder control.  MUSCULOSKELETAL: No muscle, back pain, joint pain or stiffness.  LYMPHATICS: No enlarged nodes. No history of splenectomy.  PSYCHIATRIC: No history of depression or anxiety.  ENDOCRINOLOGIC: No reports of sweating, cold or heat intolerance. No polyuria or polydipsia.  Marland Kitchen   Physical Examination Vitals:   08/09/16 1307  BP: 137/86  Pulse: 61   Vitals:   08/09/16 1307  Weight: 215 lb (97.5 kg)  Height: 5\' 10"  (1.778 m)    Gen: resting comfortably, no acute distress HEENT: no scleral icterus, pupils equal round and reactive, no palptable cervical adenopathy,  CV: RRR, no mr/, no jvd Resp: Clear to auscultation bilaterally GI: abdomen is soft, non-tender, non-distended, normal bowel sounds, no hepatosplenomegaly MSK: extremities are warm, no edema.  Skin: warm, no rash Neuro:  no focal deficits Psych: appropriate affect   Diagnostic Studies 03/2012 echo Study Conclusions  Left ventricle: LVEF is approximately 60 to 65% with small region of mild basilar hypokinesis. The cavity size was normal. Wall thickness was increased in a pattern of mild LVH.       Transthoracic echocardiography. M-mode, complete 2D, spectral Doppler, and color Doppler. Height: Height: 182.9cm. Height: 72in. Weight: Weight: 94.3kg. Weight: 207.6lb. Body mass index: BMI: 28.2kg/m^2. Body surface area:  BSA: 2.42m^2. Patient status: Outpatient. Location: Echo laboratory.  04/2016 Echo Study Conclusions  - Left ventricle: The cavity size was normal. Wall thickness was increased in a pattern of mild LVH. Systolic function  was mildly reduced. The estimated ejection fraction was in the range of 45% to 50%. Doppler parameters are consistent with abnormal left ventricular relaxation (grade 1 diastolic dysfunction). - Regional wall motion abnormality: Hypokinesis of the basal inferior myocardium; possible hypokinesis of the mid anterolateral and apical lateral myocardium. - Aortic valve: Trileaflet; mildly thickened leaflets. Sclerosis without stenosis. - Aorta: Mild aortic root dilatation. Aortic root dimension: 41 mm (ED). - Right ventricle: Systolic function was mildly reduced.  05/2016 Lexiscan MPI  Blood pressure demonstrated a normal response to exercise.  Changed from exercise to pharmacological stress test due to leg pains and SOB  Horizontal ST segment depression ST segment depression of 1 mm was noted after lexiscan injection in the II, III, aVF, V4, V5 and V6 leads.  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  This is an intermediate risk study.  The left ventricular ejection fraction is mildly decreased (45-54%).    Assessment and Plan   1. CAD - recent nuclear stress test with mild to moderate inschemia. - SOB improved with recent COPD management. Occasional mild nonexertional chest pain at times, overall better than previous.  - we will continue current meds. If progressive symptoms with antianginal therapy and COPD management consider cath.  - continue to follow symptoms with increased compliance with meds, if progressive would consider cath  2. COPD - management per pcp  3. AAA screen - male over 17 with tobacco history, plan for AAA [place]  4. Hyperlipidemia - intolerant to statins, he is overall resistant to most medications, unclear if he would be willing to try statin alternative - request labs from pcp, may consider trial of zetia.    F/u 3 months     Arnoldo Lenis, M.D.

## 2016-08-09 NOTE — Patient Instructions (Signed)
Medication Instructions:  Your physician recommends that you continue on your current medications as directed. Please refer to the Current Medication list given to you today.   Labwork: I WILL REQUEST LABS FROM PCP   Testing/Procedures: Your physician has requested that you have an abdominal aorta duplex. During this test, an ultrasound is used to evaluate the aorta. Allow 30 minutes for this exam. Do not eat after midnight the day before and avoid carbonated beverages    Follow-Up: Your physician recommends that you schedule a follow-up appointment in: 3 MONTHS   Any Other Special Instructions Will Be Listed Below (If Applicable).     If you need a refill on your cardiac medications before your next appointment, please call your pharmacy.

## 2016-08-15 ENCOUNTER — Ambulatory Visit (HOSPITAL_COMMUNITY): Admission: RE | Admit: 2016-08-15 | Payer: Medicare HMO | Source: Ambulatory Visit

## 2016-08-18 ENCOUNTER — Ambulatory Visit (HOSPITAL_COMMUNITY)
Admission: RE | Admit: 2016-08-18 | Discharge: 2016-08-18 | Disposition: A | Discharge status: Home / Self Care | Payer: Medicare HMO | Source: Ambulatory Visit | Attending: Cardiology | Admitting: Cardiology

## 2016-08-18 DIAGNOSIS — I714 Abdominal aortic aneurysm, without rupture: Secondary | ICD-10-CM | POA: Diagnosis not present

## 2016-08-18 DIAGNOSIS — Z136 Encounter for screening for cardiovascular disorders: Secondary | ICD-10-CM | POA: Diagnosis present

## 2016-08-19 ENCOUNTER — Ambulatory Visit (INDEPENDENT_AMBULATORY_CARE_PROVIDER_SITE_OTHER): Payer: Medicare HMO | Admitting: Orthopedic Surgery

## 2016-08-19 ENCOUNTER — Encounter: Payer: Self-pay | Admitting: Orthopedic Surgery

## 2016-08-19 DIAGNOSIS — M25511 Pain in right shoulder: Secondary | ICD-10-CM

## 2016-08-19 DIAGNOSIS — M7521 Bicipital tendinitis, right shoulder: Secondary | ICD-10-CM | POA: Diagnosis not present

## 2016-08-19 DIAGNOSIS — M67911 Unspecified disorder of synovium and tendon, right shoulder: Secondary | ICD-10-CM | POA: Diagnosis not present

## 2016-08-19 DIAGNOSIS — M75102 Unspecified rotator cuff tear or rupture of left shoulder, not specified as traumatic: Secondary | ICD-10-CM

## 2016-08-19 NOTE — Progress Notes (Signed)
OLD PROBLEM WORSE // OFFICE VISIT    Chief Complaint  Patient presents with  . Shoulder Pain    bilateral shoulders     HPI 76 year old male status post bilateral shoulder surgeries several years back  He was treated successfully in the past with bilateral injections  He complains of acute onset of pain in the right shoulder more anteriorly than posteriorly although is maintained his range of motion. 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   Cardiovascular: Negative for chest pain.     Past Medical History:  Diagnosis Date  . Anxiety and depression   . Arteriosclerotic cardiovascular disease (ASCVD)    06/2008: DES to the RCA and LAD  . 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 Carpal tunnel syndrome   . Chronic anticoagulation   . Deep vein thrombosis ([place])   . Degenerative joint disease   . Gastroesophageal reflux disease   . Hyperlipidemia   . Hypertension   . Tobacco abuse     Past Surgical History:  Procedure Laterality Date  . COLONOSCOPY  ?  Date  . INGUINAL HERNIA REPAIR    . ORIF-unknown fracture    . ROTATOR CUFF REPAIR    . TOTAL HIP ARTHROPLASTY  2004   Left    Family History  Problem Relation Age of Onset  . Coronary artery disease Father   . Coronary artery disease Mother        died post-CABG  . Heart disease Mother    Social History  Substance Use Topics  . Smoking status: Current Every Day Smoker    Packs/day: 1.50    Years: 55.00  . Smokeless tobacco: Never Used  . Alcohol use 0.0 oz/week     Comment: 1.5 bottles daily of whiskey - 2 bottles of wine daily    There were no vitals taken for  this visit.  Physical Exam  Constitutional: He is oriented to person, place, and time. He appears well-developed and well-nourished.  Vital signs have been reviewed and are stable. Gen. appearance the patient is well-developed and well-nourished with normal grooming and hygiene.   Musculoskeletal:  GAIT IS normal  Neurological: He is alert and oriented to person, place, and time.  Skin: Skin is warm and dry. No erythema.  Psychiatric: He has a normal mood and affect.  Vitals reviewed.   Ortho Exam  right shoulder we have an anterior scar he is tender midportion of that scar has full range of motion and stability normal strength in his rotator cuff skin no erythema neurovascular exam intact  Left shoulder we also have an anterior scar for range of motion mild tenderness posterior to the subacromial or acromial space stability and strength remain normal skin intact neurovascular exam normal   Meds ordered this encounter  Medications  . tamsulosin (FLOMAX) 0.4 MG CAPS capsule    Encounter Diagnoses  Name Primary?  . Acute pain of right shoulder Yes  . Disorder of right rotator cuff      PLAN:  I injected his right shoulder anteriorly for the biceps tendon rotator interval pain and then I  injected his left shoulder for subacromial impingement    Procedure note the subacromial injection shoulder left   Verbal consent was obtained to inject the  Left   Shoulder  Timeout was completed to confirm the injection site is a subacromial space of the  left  shoulder  Medication used Depo-Medrol 40 mg and lidocaine 1% 3 cc  Anesthesia was provided by ethyl chloride  The injection was performed in the left  posterior subacromial space. After pinning the skin with alcohol and anesthetized the skin with ethyl chloride the subacromial space was injected using a 20-gauge needle. There were no complications  Sterile dressing was applied.   procedure note inject left biceps  tendon  Timeout was completed to confirm injection site is biceps tendon right shoulder  Medication used Depo-Medrol 40 mg lidocaine 1% 3 mL  Anesthesia was provided by ethyl chloride  The injection was performed at the point of maximal tenderness there were no complications

## 2016-08-24 ENCOUNTER — Telehealth: Payer: Self-pay

## 2016-08-24 NOTE — Telephone Encounter (Signed)
Called pt, no answer- left message for pt to return call.  

## 2016-08-24 NOTE — Telephone Encounter (Signed)
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

## 2016-08-25 ENCOUNTER — Telehealth: Payer: Self-pay

## 2016-08-25 NOTE — Telephone Encounter (Signed)
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

## 2016-08-25 NOTE — Telephone Encounter (Signed)
Called pt, he was not available to speak at this time. I asked his wife to have him return my call. She voiced understanding.

## 2016-08-29 ENCOUNTER — Telehealth: Payer: Self-pay

## 2016-08-29 NOTE — Telephone Encounter (Signed)
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

## 2016-08-29 NOTE — Telephone Encounter (Signed)
Called pt. No answer. Left message for pt to return call.  

## 2016-09-05 DIAGNOSIS — I2510 Atherosclerotic heart disease of native coronary artery without angina pectoris: Secondary | ICD-10-CM | POA: Diagnosis not present

## 2016-09-05 DIAGNOSIS — I10 Essential (primary) hypertension: Secondary | ICD-10-CM | POA: Diagnosis not present

## 2016-09-05 DIAGNOSIS — R69 Illness, unspecified: Secondary | ICD-10-CM | POA: Diagnosis not present

## 2016-09-05 DIAGNOSIS — N401 Enlarged prostate with lower urinary tract symptoms: Secondary | ICD-10-CM | POA: Diagnosis not present

## 2016-09-19 ENCOUNTER — Telehealth: Payer: Self-pay

## 2016-09-19 NOTE — Telephone Encounter (Signed)
Pt received letter from DS to be triaged. I tried to take a message, but could not. Patient said he will call back because he doesn't answer the phone, he just makes calls. He will call DS back later today or tomorrow when he gets all his information together.

## 2016-09-27 NOTE — Telephone Encounter (Signed)
Noted  

## 2016-09-28 ENCOUNTER — Ambulatory Visit: Payer: Self-pay | Admitting: Cardiology

## 2016-10-06 DIAGNOSIS — R079 Chest pain, unspecified: Secondary | ICD-10-CM | POA: Diagnosis not present

## 2016-10-17 ENCOUNTER — Ambulatory Visit: Payer: Self-pay | Admitting: Orthopedic Surgery

## 2016-11-14 ENCOUNTER — Encounter: Payer: Self-pay | Admitting: Cardiology

## 2016-11-14 ENCOUNTER — Ambulatory Visit: Payer: Medicare HMO | Admitting: Cardiology

## 2016-11-14 VITALS — BP 144/88 | HR 58 | Ht 72.0 in | Wt 218.0 lb

## 2016-11-14 DIAGNOSIS — I714 Abdominal aortic aneurysm, without rupture, unspecified: Secondary | ICD-10-CM

## 2016-11-14 DIAGNOSIS — I2510 Atherosclerotic heart disease of native coronary artery without angina pectoris: Secondary | ICD-10-CM | POA: Diagnosis not present

## 2016-11-14 DIAGNOSIS — E782 Mixed hyperlipidemia: Secondary | ICD-10-CM

## 2016-11-14 NOTE — Progress Notes (Signed)
Clinical Summary Mr. Ciullo is a 76 y.o.male seen today for follow up of the following medical problems.    1. CAD - history of DES x 2 to LAD in 06/2008. Documented PMH on epic reports stent to RCA at that time as well however I do not see this indicated in any cath reports - from prior notes mixed history of medication compliance.  - recent troubles with dysnpnea and chest pain. He had stopped his meds at the time, also was being treatd for COPD exacerbation  - 04/2016 echo LVEF 45-50%, grade I diastolci dysfunction, Hypokinesis of the basal inferior myocardium; possible hypokinesis of the mid anterolateral and apical lateral myocardium. - 05/2016 lexiscan MPI inferior scar with mild to moderate peri-infarct ischemia.   - 2 episodes of chest pain since last visit, he thinks is indigestion. Better with positioning, mainly happens with laying down. No exertional symptoms.    2. COPD - 05/2016 PFTs: moderate OSA, severely reduced DLCO.  - inhalers have been too expensive in the past  - no recent symptoms   3. Hyperlipidemia - he stopped simvastatin on his own. He states it caused him to feel very fatigued.  - he was to try pravastatin at last visit, currently not taking.  - he reports has failed multiple statins over time  4. AAA - mild anerurysm by [place] 08/2016 at 3 cm, requires repeat 08/2019  5. Depression/anxiety - followed by psych   Past Medical History:  Diagnosis Date  . Anxiety and depression   . Arteriosclerotic cardiovascular disease (ASCVD)    06/2008: DES to the RCA and LAD  . 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 Carpal tunnel syndrome   . Chronic anticoagulation   . Deep vein thrombosis ([place])   . Degenerative joint disease   . Gastroesophageal reflux disease   . Hyperlipidemia   . Hypertension   . Tobacco abuse      Allergies  Allergen Reactions  . Codeine Nausea And Vomiting and Other (See Comments)    "go fuzzy"     Current Outpatient  Medications  Medication Sig Dispense Refill  . acetaminophen (TYLENOL) 650 MG CR tablet Take 650 mg by mouth every 8 (eight) hours as needed for pain.    Marland Kitchen aspirin EC 81 MG tablet Take 81 mg by mouth daily.    . benazepril (LOTENSIN) 40 MG tablet Take 1 tablet (40 mg total) by mouth daily. 30 tablet 6  . clonazePAM (KLONOPIN) 0.5 MG tablet Take 0.5 mg by mouth 3 (three) times daily as needed for anxiety.     . FLUOXETINE HCL PO Take by mouth.    . furosemide (LASIX) 40 MG tablet     . Ibuprofen (ADVIL) 200 MG CAPS Take 600 mg by mouth 3 (three) times daily as needed (pain).    . MELATONIN PO Take by mouth.    . metoprolol tartrate (LOPRESSOR) 25 MG tablet Take 1 tablet (25 mg total) by mouth 2 (two) times daily. 180 tablet 3  . nitroGLYCERIN (NITROSTAT) 0.4 MG SL tablet Place 1 tablet (0.4 mg total) under the tongue every 5 (five) minutes as needed for chest pain. 25 tablet 3  . pantoprazole (PROTONIX) 40 MG tablet     . tamsulosin (FLOMAX) 0.4 MG CAPS capsule      No current facility-administered medications for this visit.      Past Surgical History:  Procedure Laterality Date  . COLONOSCOPY  ?  Date  . INGUINAL  HERNIA REPAIR    . ORIF-unknown fracture    . ROTATOR CUFF REPAIR    . TOTAL HIP ARTHROPLASTY  2004   Left     Allergies  Allergen Reactions  . Codeine Nausea And Vomiting and Other (See Comments)    "go fuzzy"      Family History  Problem Relation Age of Onset  . Coronary artery disease Father   . Coronary artery disease Mother        died post-CABG  . Heart disease Mother      Social History Mr. Chopin reports that he has been smoking.  He has a 82.50 pack-year smoking history. he has never used smokeless tobacco. Mr. Macomber reports that he drinks alcohol.   Review of Systems CONSTITUTIONAL: No weight loss, fever, chills, weakness or fatigue.  HEENT: Eyes: No visual loss, blurred vision, double vision or yellow sclerae.No hearing loss, sneezing,  congestion, runny nose or sore throat.  SKIN: No rash or itching.  CARDIOVASCULAR: per hpi RESPIRATORY: per hpi  GASTROINTESTINAL: No anorexia, nausea, vomiting or diarrhea. No abdominal pain or blood.  GENITOURINARY: No burning on urination, no polyuria NEUROLOGICAL: No headache, dizziness, syncope, paralysis, ataxia, numbness or tingling in the extremities. No change in bowel or bladder control.  MUSCULOSKELETAL: No muscle, back pain, joint pain or stiffness.  LYMPHATICS: No enlarged nodes. No history of splenectomy.  PSYCHIATRIC: No history of depression or anxiety.  ENDOCRINOLOGIC: No reports of sweating, cold or heat intolerance. No polyuria or polydipsia.  Marland Kitchen   Physical Examination Vitals:   11/14/16 1312  BP: (!) 144/88  Pulse: (!) 58  SpO2: 98%   Vitals:   11/14/16 1312  Weight: 218 lb (98.9 kg)  Height: 6' (1.829 m)    Gen: resting comfortably, no acute distress HEENT: no scleral icterus, pupils equal round and reactive, no palptable cervical adenopathy,  CV: RRR, no m/r/g, no jvd Resp: Clear to auscultation bilaterally GI: abdomen is soft, non-tender, non-distended, normal bowel sounds, no hepatosplenomegaly MSK: extremities are warm, no edema.  Skin: warm, no rash Neuro:  no focal deficits Psych: appropriate affect   Diagnostic Studies 03/2012 echo Study Conclusions  Left ventricle: LVEF is approximately 60 to 65% with small region of mild basilar hypokinesis. The cavity size was normal. Wall thickness was increased in a pattern of mild LVH.       Transthoracic echocardiography. M-mode, complete 2D, spectral Doppler, and color Doppler. Height: Height: 182.9cm. Height: 72in. Weight: Weight: 94.3kg. Weight: 207.6lb. Body mass index: BMI: 28.2kg/m^2. Body surface area:  BSA: 2.43m^2. Patient status: Outpatient. Location: Echo laboratory.  04/2016 Echo Study Conclusions  - Left ventricle: The cavity size was normal. Wall thickness  was increased in a pattern of mild LVH. Systolic function was mildly reduced. The estimated ejection fraction was in the range of 45% to 50%. Doppler parameters are consistent with abnormal left ventricular relaxation (grade 1 diastolic dysfunction). - Regional wall motion abnormality: Hypokinesis of the basal inferior myocardium; possible hypokinesis of the mid anterolateral and apical lateral myocardium. - Aortic valve: Trileaflet; mildly thickened leaflets. Sclerosis without stenosis. - Aorta: Mild aortic root dilatation. Aortic root dimension: 41 mm (ED). - Right ventricle: Systolic function was mildly reduced.  05/2016 Lexiscan MPI  Blood pressure demonstrated a normal response to exercise.  Changed from exercise to pharmacological stress test due to leg pains and SOB  Horizontal ST segment depression ST segment depression of 1 mm was noted after lexiscan injection in the II, III, aVF, V4, V5  and V6 leads.  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  This is an intermediate risk study.  The left ventricular ejection fraction is mildly decreased (45-54%).    Assessment and Plan  1. CAD - recent nuclear stress test with mild to moderate inschemia. - currently asymptomatic, we are continuing medical therapy. If signficant symptoms would consider cath.   2. COPD - management per pcp  3. AAA - contniue serial imaging, currently just mild aneurysm  4. Hyperlipidemia - intolerant to statins, he is overall resistant to most medications. Not interested in trying alternative    F/u 3 months      Arnoldo Lenis, M.D.

## 2016-11-14 NOTE — Patient Instructions (Signed)
Your physician wants you to follow-up in:6 months  with Dr.Branch You will receive a reminder letter in the mail two months in advance. If you don't receive a letter, please call our office to schedule the follow-up appointment.   Your physician recommends that you continue on your current medications as directed. Please refer to the Current Medication list given to you today.    If you need a refill on your cardiac medications before your next appointment, please call your pharmacy.      No lab work or tests ordered today.       Thank you for choosing [facility] !         

## 2016-11-15 ENCOUNTER — Encounter: Payer: Self-pay | Admitting: Cardiology

## 2016-12-26 ENCOUNTER — Other Ambulatory Visit: Payer: Self-pay | Admitting: Physician Assistant

## 2016-12-26 DIAGNOSIS — R69 Illness, unspecified: Secondary | ICD-10-CM | POA: Diagnosis not present

## 2016-12-26 DIAGNOSIS — J449 Chronic obstructive pulmonary disease, unspecified: Secondary | ICD-10-CM | POA: Diagnosis not present

## 2016-12-26 DIAGNOSIS — I10 Essential (primary) hypertension: Secondary | ICD-10-CM | POA: Diagnosis not present

## 2017-01-30 DIAGNOSIS — R69 Illness, unspecified: Secondary | ICD-10-CM | POA: Diagnosis not present

## 2017-01-30 DIAGNOSIS — K279 Peptic ulcer, site unspecified, unspecified as acute or chronic, without hemorrhage or perforation: Secondary | ICD-10-CM | POA: Diagnosis not present

## 2017-02-28 ENCOUNTER — Ambulatory Visit: Payer: Medicare HMO | Admitting: General Surgery

## 2017-03-14 ENCOUNTER — Ambulatory Visit: Payer: Medicare HMO | Admitting: General Surgery

## 2017-04-06 ENCOUNTER — Ambulatory Visit: Payer: Medicare HMO | Admitting: General Surgery

## 2017-05-01 ENCOUNTER — Telehealth: Payer: Self-pay | Admitting: Cardiology

## 2017-05-01 NOTE — Telephone Encounter (Signed)
Patient states that his legs are "staying tired". Wants to have them checked to see if they are getting enough circulation. / tg

## 2017-05-01 NOTE — Telephone Encounter (Signed)
attempt to reach, vm not set up-cc

## 2017-05-02 NOTE — Telephone Encounter (Signed)
Pt will speak with pcp 

## 2017-05-02 NOTE — Telephone Encounter (Signed)
Poor circulation would not generally caused weakness. Typical symptoms would be cramping of the thighs or calves with walking, better with rest. If main issue is weakness I would suggets he touch base with his pcp to discuss. If he is having cramping pain with walking then yes we can order ABIs   Zandra Abts MD

## 2017-05-02 NOTE — Telephone Encounter (Signed)
Patient says she has been depressed, not getting up much.Notes his legs feels heavy and wonders if he needs tests to check his blood flow

## 2017-06-21 ENCOUNTER — Other Ambulatory Visit (HOSPITAL_COMMUNITY): Payer: Self-pay | Admitting: Internal Medicine

## 2017-06-21 DIAGNOSIS — G4452 New daily persistent headache (NDPH): Secondary | ICD-10-CM

## 2017-06-26 ENCOUNTER — Ambulatory Visit (HOSPITAL_COMMUNITY)
Admission: RE | Admit: 2017-06-26 | Discharge: 2017-06-26 | Disposition: A | Discharge status: Home / Self Care | Payer: Medicare HMO | Source: Ambulatory Visit | Attending: Internal Medicine | Admitting: Internal Medicine

## 2017-06-26 DIAGNOSIS — Z8673 Personal history of transient ischemic attack (TIA), and cerebral infarction without residual deficits: Secondary | ICD-10-CM | POA: Diagnosis not present

## 2017-06-26 DIAGNOSIS — G4452 New daily persistent headache (NDPH): Secondary | ICD-10-CM | POA: Diagnosis present

## 2017-08-18 ENCOUNTER — Other Ambulatory Visit: Payer: Self-pay | Admitting: Cardiology

## 2017-10-10 ENCOUNTER — Other Ambulatory Visit: Payer: Self-pay | Admitting: Cardiology

## 2017-11-30 ENCOUNTER — Ambulatory Visit (INDEPENDENT_AMBULATORY_CARE_PROVIDER_SITE_OTHER): Payer: Medicare HMO | Admitting: Otolaryngology

## 2017-11-30 DIAGNOSIS — H9313 Tinnitus, bilateral: Secondary | ICD-10-CM | POA: Diagnosis not present

## 2017-11-30 DIAGNOSIS — H903 Sensorineural hearing loss, bilateral: Secondary | ICD-10-CM

## 2017-12-30 ENCOUNTER — Other Ambulatory Visit: Payer: Self-pay | Admitting: Cardiology

## 2018-01-11 ENCOUNTER — Other Ambulatory Visit: Payer: Self-pay | Admitting: Cardiology

## 2018-01-17 ENCOUNTER — Ambulatory Visit (INDEPENDENT_AMBULATORY_CARE_PROVIDER_SITE_OTHER): Payer: Medicare HMO | Admitting: Urology

## 2018-01-17 ENCOUNTER — Other Ambulatory Visit (HOSPITAL_COMMUNITY): Payer: Self-pay | Admitting: Urology

## 2018-01-17 DIAGNOSIS — R972 Elevated prostate specific antigen [PSA]: Secondary | ICD-10-CM | POA: Diagnosis not present

## 2018-01-31 ENCOUNTER — Ambulatory Visit (HOSPITAL_COMMUNITY)
Admission: RE | Admit: 2018-01-31 | Discharge: 2018-01-31 | Disposition: A | Discharge status: Home / Self Care | Payer: Medicare HMO | Source: Ambulatory Visit | Attending: Urology | Admitting: Urology

## 2018-01-31 ENCOUNTER — Encounter (HOSPITAL_COMMUNITY): Payer: Self-pay

## 2018-01-31 DIAGNOSIS — R972 Elevated prostate specific antigen [PSA]: Secondary | ICD-10-CM | POA: Diagnosis present

## 2018-01-31 MED ORDER — GENTAMICIN SULFATE 40 MG/ML IJ SOLN
80.0000 mg | Freq: Once | INTRAMUSCULAR | Status: AC
  Administered 2018-01-31: 80 mg via INTRAMUSCULAR

## 2018-01-31 MED ORDER — LIDOCAINE HCL (PF) 2 % IJ SOLN
INTRAMUSCULAR | Status: AC
  Administered 2018-01-31: 10 mL
  Filled 2018-01-31: qty 10

## 2018-01-31 MED ORDER — GENTAMICIN SULFATE 40 MG/ML IJ SOLN
INTRAMUSCULAR | Status: AC
  Filled 2018-01-31: qty 2

## 2018-02-23 ENCOUNTER — Ambulatory Visit: Payer: Medicare HMO | Admitting: Urology

## 2018-02-23 DIAGNOSIS — C61 Malignant neoplasm of prostate: Secondary | ICD-10-CM

## 2018-03-06 ENCOUNTER — Other Ambulatory Visit (HOSPITAL_COMMUNITY): Payer: Self-pay | Admitting: Urology

## 2018-03-06 ENCOUNTER — Telehealth: Payer: Self-pay | Admitting: Radiation Oncology

## 2018-03-06 DIAGNOSIS — C61 Malignant neoplasm of prostate: Secondary | ICD-10-CM

## 2018-03-06 NOTE — Telephone Encounter (Signed)
Received voicemail message from patient requesting to reschedule his consult. Sent inbasket to [employer], [facility], and [employer] in scheduling requesting they contact this patient.

## 2018-03-07 ENCOUNTER — Telehealth (HOSPITAL_COMMUNITY): Payer: Self-pay

## 2018-03-07 ENCOUNTER — Other Ambulatory Visit (HOSPITAL_COMMUNITY): Payer: Self-pay | Admitting: Urology

## 2018-03-07 DIAGNOSIS — C61 Malignant neoplasm of prostate: Secondary | ICD-10-CM

## 2018-03-09 ENCOUNTER — Ambulatory Visit: Payer: Medicare HMO | Admitting: Radiation Oncology

## 2018-03-09 ENCOUNTER — Ambulatory Visit: Payer: Medicare HMO

## 2018-03-13 ENCOUNTER — Encounter (HOSPITAL_COMMUNITY)
Admission: RE | Admit: 2018-03-13 | Discharge: 2018-03-13 | Disposition: A | Discharge status: Home / Self Care | Payer: Medicare HMO | Source: Ambulatory Visit | Attending: Urology | Admitting: Urology

## 2018-03-13 DIAGNOSIS — C61 Malignant neoplasm of prostate: Secondary | ICD-10-CM | POA: Insufficient documentation

## 2018-03-13 MED ORDER — TECHNETIUM TC 99M MEDRONATE IV KIT
20.0000 | PACK | Freq: Once | INTRAVENOUS | Status: AC | PRN
  Administered 2018-03-13: 20.4 via INTRAVENOUS

## 2018-03-23 ENCOUNTER — Ambulatory Visit (HOSPITAL_COMMUNITY)
Admission: RE | Admit: 2018-03-23 | Discharge: 2018-03-23 | Disposition: A | Discharge status: Home / Self Care | Payer: Medicare HMO | Source: Ambulatory Visit | Attending: Urology | Admitting: Urology

## 2018-03-23 ENCOUNTER — Other Ambulatory Visit: Payer: Self-pay

## 2018-03-23 ENCOUNTER — Encounter (HOSPITAL_COMMUNITY): Payer: Self-pay

## 2018-03-23 DIAGNOSIS — C61 Malignant neoplasm of prostate: Secondary | ICD-10-CM | POA: Diagnosis not present

## 2018-03-23 LAB — POCT I-STAT CREATININE: CREATININE: 1.4 mg/dL — AB (ref 0.61–1.24)

## 2018-03-23 MED ORDER — IOHEXOL 300 MG/ML  SOLN
100.0000 mL | Freq: Once | INTRAMUSCULAR | Status: AC | PRN
  Administered 2018-03-23: 100 mL via INTRAVENOUS

## 2018-03-30 ENCOUNTER — Telehealth: Payer: Self-pay | Admitting: Radiation Oncology

## 2018-03-30 ENCOUNTER — Ambulatory Visit: Payer: Self-pay | Admitting: Urology

## 2018-03-30 NOTE — Telephone Encounter (Signed)
Received voicemail message from patient's wife, Blanch Media, requesting a return call. Phoned back as request. Wife is inquiring about when her husband's consult has been rescheduled for. Encouraged them to present on 04/20/2018 at 1415 for consultation. Wife verbalized understanding via [place].

## 2018-04-09 ENCOUNTER — Encounter (HOSPITAL_COMMUNITY): Payer: Self-pay | Admitting: Emergency Medicine

## 2018-04-09 ENCOUNTER — Emergency Department (HOSPITAL_COMMUNITY): Payer: Medicare HMO

## 2018-04-09 ENCOUNTER — Other Ambulatory Visit: Payer: Self-pay

## 2018-04-09 ENCOUNTER — Emergency Department (HOSPITAL_COMMUNITY)
Admission: EM | Admit: 2018-04-09 | Discharge: 2018-04-09 | Disposition: A | Discharge status: Home / Self Care | Payer: Medicare HMO | Attending: Emergency Medicine | Admitting: Emergency Medicine

## 2018-04-09 DIAGNOSIS — Y999 Unspecified external cause status: Secondary | ICD-10-CM | POA: Diagnosis not present

## 2018-04-09 DIAGNOSIS — I2510 Atherosclerotic heart disease of native coronary artery without angina pectoris: Secondary | ICD-10-CM | POA: Insufficient documentation

## 2018-04-09 DIAGNOSIS — Y929 Unspecified place or not applicable: Secondary | ICD-10-CM | POA: Diagnosis not present

## 2018-04-09 DIAGNOSIS — F17210 Nicotine dependence, cigarettes, uncomplicated: Secondary | ICD-10-CM | POA: Insufficient documentation

## 2018-04-09 DIAGNOSIS — Z7982 Long term (current) use of aspirin: Secondary | ICD-10-CM | POA: Insufficient documentation

## 2018-04-09 DIAGNOSIS — Y9389 Activity, other specified: Secondary | ICD-10-CM | POA: Diagnosis not present

## 2018-04-09 DIAGNOSIS — Z96642 Presence of left artificial hip joint: Secondary | ICD-10-CM | POA: Insufficient documentation

## 2018-04-09 DIAGNOSIS — S65511A Laceration of blood vessel of left index finger, initial encounter: Secondary | ICD-10-CM | POA: Diagnosis not present

## 2018-04-09 DIAGNOSIS — S61211A Laceration without foreign body of left index finger without damage to nail, initial encounter: Secondary | ICD-10-CM | POA: Diagnosis present

## 2018-04-09 DIAGNOSIS — W312XXA Contact with powered woodworking and forming machines, initial encounter: Secondary | ICD-10-CM | POA: Diagnosis not present

## 2018-04-09 DIAGNOSIS — T1490XA Injury, unspecified, initial encounter: Secondary | ICD-10-CM

## 2018-04-09 DIAGNOSIS — I10 Essential (primary) hypertension: Secondary | ICD-10-CM | POA: Diagnosis not present

## 2018-04-09 DIAGNOSIS — Z23 Encounter for immunization: Secondary | ICD-10-CM | POA: Diagnosis not present

## 2018-04-09 MED ORDER — TETANUS-DIPHTH-ACELL PERTUSSIS 5-2.5-18.5 LF-MCG/0.5 IM SUSP
0.5000 mL | Freq: Once | INTRAMUSCULAR | Status: AC
  Administered 2018-04-09: 0.5 mL via INTRAMUSCULAR
  Filled 2018-04-09: qty 0.5

## 2018-04-09 NOTE — ED Provider Notes (Signed)
[facility] EMERGENCY DEPARTMENT Provider Note   CSN: [id number] Arrival date & time: 04/09/18  1706    History   Chief Complaint Chief Complaint  Patient presents with  . Laceration    HPI Alex Lara. is a 78 y.o. male.     The history is provided by the patient. No language interpreter was used.  Laceration  Location:  Hand   Past Medical History:  Diagnosis Date  . Anxiety and depression   . Arteriosclerotic cardiovascular disease (ASCVD)    06/2008: DES to the RCA and LAD  . 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 Carpal tunnel syndrome   . Chronic anticoagulation   . Deep vein thrombosis ([place])   . Degenerative joint disease   . Gastroesophageal reflux disease   . Hyperlipidemia   . Hypertension   . Tobacco abuse     Patient Active Problem List   Diagnosis Date Noted  . Chest tightness 05/04/2016  . Dyspnea on exertion 05/04/2016  . Arteriosclerotic cardiovascular disease (ASCVD)   . Hyperlipidemia   . Tobacco abuse   . Deep vein thrombosis ([place])   . Gastroesophageal reflux disease   . Hypertension   . OSTEOARTHRITIS 02/14/2006    Past Surgical History:  Procedure Laterality Date  . COLONOSCOPY  ?  Date  . INGUINAL HERNIA REPAIR    . ORIF-unknown fracture    . ROTATOR CUFF REPAIR    . TOTAL HIP ARTHROPLASTY  2004   Left        Home Medications    Prior to Admission medications   Medication Sig Start Date End Date Taking? Authorizing Provider  aspirin EC 81 MG tablet Take 81 mg by mouth daily.    [provider]  benazepril (LOTENSIN) 40 MG tablet TAKE 1 TABLET BY MOUTH ONCE DAILY 01/11/18   Arnoldo Lenis, MD  clonazePAM (KLONOPIN) 0.5 MG tablet Take 0.5 mg by mouth 3 (three) times daily as needed for anxiety.     [provider]  FLUOXETINE HCL PO Take by mouth.    [provider]  furosemide (LASIX) 40 MG tablet Take 40 mg daily by mouth.  03/28/16   [provider]  Ibuprofen (ADVIL) 200 MG CAPS  Take 600 mg by mouth 3 (three) times daily as needed (pain).    [provider]  MELATONIN PO Take by mouth.    [provider]  metoprolol tartrate (LOPRESSOR) 25 MG tablet TAKE 1 TABLET BY MOUTH TWICE DAILY 01/01/18   Arnoldo Lenis, MD  nitroGLYCERIN (NITROSTAT) 0.4 MG SL tablet Place 1 tablet (0.4 mg total) under the tongue every 5 (five) minutes as needed for chest pain. 02/16/16 08/19/16  Josue Hector, MD  pantoprazole (PROTONIX) 40 MG tablet  06/09/16   [provider]  tamsulosin (FLOMAX) 0.4 MG CAPS capsule  06/25/16   [provider]    Family History Family History  Problem Relation Age of Onset  . Coronary artery disease Father   . Coronary artery disease Mother        died post-CABG  . Heart disease Mother     Social History Social History   Tobacco Use  . Smoking status: Current Every Day Smoker    Packs/day: 1.50    Years: 55.00    Pack years: 82.50  . Smokeless tobacco: Never Used  Substance Use Topics  . Alcohol use: Yes    Alcohol/week: 0.0 standard drinks    Comment: Patient states  he is cutting down. 2 drinks today. none yesterday. History of ETOH abuse  . Drug use: No     Allergies   Codeine   Review of Systems Review of Systems  Skin: Positive for wound.  All other systems reviewed and are negative.    Physical Exam Updated Vital Signs Ht 6\' 1"  (1.854 m)   Wt 95.3 kg   BMI 27.71 kg/m   Physical Exam Vitals signs reviewed.  HENT:     Head: Normocephalic.     Mouth/Throat:     Mouth: Mucous membranes are moist.  Neck:     Musculoskeletal: Normal range of motion.  Cardiovascular:     Rate and Rhythm: Normal rate.     Pulses: Normal pulses.  Musculoskeletal:        General: Tenderness and deformity present.     Comments: Laceration distal left index finger,  Skin completely avulsed,  From  Nv intact, ns intact below   Skin:    General: Skin is warm.  Neurological:     General: No focal deficit  present.     Mental Status: He is alert.  Psychiatric:        Mood and Affect: Mood normal.      ED Treatments / Results  Labs (all labs ordered are listed, but only abnormal results are displayed) Labs Reviewed - No data to display  EKG None  Radiology Dg Finger Index Left  Result Date: 04/09/2018 CLINICAL DATA:  Saw injury to index finger. EXAM: LEFT INDEX FINGER 2+V COMPARISON:  None. FINDINGS: No acute fracture or dislocation. Joint spaces are preserved. Bone mineralization is normal. Soft tissue irregularity and laceration along the dorsal aspect of the distal index finger. No radiopaque foreign body identified. IMPRESSION: 1. Dorsal distal index finger soft tissue injury. No acute osseous abnormality or radiopaque foreign body. Electronically Signed   By: Titus Dubin M.D.   On: 04/09/2018 17:58    Procedures Procedures (including critical care time)  Medications Ordered in ED Medications  Tdap (BOOSTRIX) injection 0.5 mL (0.5 mLs Intramuscular Given 04/09/18 1732)     Initial Impression / Assessment and Plan / ED Course  I have reviewed the triage vital signs and the nursing notes.  Pertinent labs & imaging results that were available during my care of the patient were reviewed by me and considered in my medical decision making (see chart for details).        MDM  Pt counseled on injury.  Pt advised wound care.  Pt advised to schedule to see Dr. Aline Brochure for evaluation in 2-3 days.  Return for wound care if any problems.    Final Clinical Impressions(s) / ED Diagnoses   Final diagnoses:  Injury  Laceration of blood vessel of left index finger, initial encounter    ED Discharge Orders    None    An After Visit Summary was printed and given to the patient.   [name], PA-C 04/09/18 [place], [place], DO 04/11/18 [phone]

## 2018-04-09 NOTE — ED Triage Notes (Signed)
Patient states saw blade was still turning when he reached in to get a wood block. Saw blade caught the end of his finger.

## 2018-04-09 NOTE — Discharge Instructions (Signed)
Return if any problems.

## 2018-04-20 ENCOUNTER — Ambulatory Visit
Admission: RE | Admit: 2018-04-20 | Discharge: 2018-04-20 | Disposition: A | Discharge status: Home / Self Care | Payer: Medicare HMO | Source: Ambulatory Visit | Attending: Radiation Oncology | Admitting: Radiation Oncology

## 2018-04-20 ENCOUNTER — Encounter: Payer: Self-pay | Admitting: Radiation Oncology

## 2018-04-20 ENCOUNTER — Other Ambulatory Visit: Payer: Self-pay

## 2018-04-20 VITALS — Ht 73.0 in | Wt 210.0 lb

## 2018-04-20 DIAGNOSIS — C61 Malignant neoplasm of prostate: Secondary | ICD-10-CM

## 2018-04-20 HISTORY — DX: Malignant neoplasm of prostate: C61

## 2018-04-20 NOTE — Progress Notes (Signed)
GU Location of Tumor / Histology: prostatic adenocarcinoma  If Prostate Cancer, Gleason Score is (4 + 3) and PSA is (20.1). Prostate volume: 38.5  [name] was referred by Dr. Oletha Blend III to Dr. Alyson Ingles in January 2020 for further evaluation of an elevated PSA.   Biopsies of prostate (if applicable) revealed:    Past/Anticipated interventions by urology, if any: Biopsy, CT abd/pelvis (negative), bone scan (negative), referral for consideration of radiotherapy  Past/Anticipated interventions by medical oncology, if any: no  Weight changes, if any: no  Bowel/Bladder complaints, if any: IPSS 20. SHIM 1. Denies dysuria or hematuria. Reports urinary leakage and incontinence. Reports ED. Reports nocturia, hesitancy, and a sensation of not emptying his bladder completely. Reports night sweats have stopped.   Nausea/Vomiting, if any: no  Pain issues, if any:  Denies bone pain.  SAFETY ISSUES:  Prior radiation? no  Pacemaker/ICD? no  Possible current pregnancy? no, male patient  Is the patient on methotrexate? no  Current Complaints / other details:  78 year old male. Married. AX: codeine. Smoker.

## 2018-04-20 NOTE — Progress Notes (Signed)
See progress note under physician encounter. 

## 2018-04-20 NOTE — Progress Notes (Signed)
Radiation Oncology         [phone]) [phone] ________________________________  Initial Outpatient Consultation - Conducted via telephone due to current COVID-19 concerns for limiting patient exposure  Name: Alex Lara. MRN: [id number]  Date: 04/20/2018  DOB: Nov 13, 1940  JS:HFWYO, Alex Manner, MD  McKenzie, Candee Furbish, MD   REFERRING PHYSICIAN: Cleon Gustin, MD  DIAGNOSIS: 78 y.o. gentleman with stage T1c adenocarcinoma of the prostate with a Gleason's score of 4+3 and a PSA of 20.1    ICD-10-CM   1. Malignant neoplasm of prostate ([place]) C61     HISTORY OF PRESENT ILLNESS::Alex Lara. is a 78 y.o. gentleman.  He was noted to have an elevated PSA of 20.1 by his primary care physician, Dr. Willey Blade.  Accordingly, he was referred for evaluation in urology by Dr. Alyson Ingles in [place] on 01/17/18,  digital rectal examination was performed at that time revealing hard lobes, but no nodules.  The patient proceeded to transrectal ultrasound with 12 biopsies of the prostate on 01/31/18.  The prostate volume measured 38.5 cc.  Out of 12 core biopsies, 7 were positive.  The maximum Gleason score was 4+3, and this was seen in all 6 biopsies from the left side.   The patient reviewed the biopsy results with his urologist and he has kindly been referred today for discussion of potential radiation treatment options.   He underwent bone scan on 03/13/2018, which showed no scintigraphic evidence of osseous metastatic disease. He also underwent abdomen/pelvis CT on 03/23/2018, which showed no evidence to suggest metastatic disease.   PREVIOUS RADIATION THERAPY: No  PAST MEDICAL HISTORY:  has a past medical history of Anxiety and depression, Arteriosclerotic cardiovascular disease (ASCVD), Blood clotting disorder (HCC), Carpal tunnel syndrome, Chronic anticoagulation, Deep vein thrombosis (HCC), Degenerative joint disease, Gastroesophageal reflux disease, Hyperlipidemia, Hypertension, Prostate cancer ([place]),  and Tobacco abuse.    PAST SURGICAL HISTORY: Past Surgical History:  Procedure Laterality Date   COLONOSCOPY  ?  Date   INGUINAL HERNIA REPAIR     ORIF-unknown fracture     PROSTATE BIOPSY     ROTATOR CUFF REPAIR     TOTAL HIP ARTHROPLASTY  2004   Left    FAMILY HISTORY: family history includes Breast cancer in his maternal aunt; Coronary artery disease in his father and mother; Heart disease in his mother; Lung cancer in his cousin; Melanoma in his daughter.  SOCIAL HISTORY:  reports that he has been smoking. He has a 82.50 pack-year smoking history. He has never used smokeless tobacco. He reports current alcohol use. He reports that he does not use drugs.  ALLERGIES: Codeine  MEDICATIONS:  Current Outpatient Medications  Medication Sig Dispense Refill   atorvastatin (LIPITOR) 20 MG tablet Take 20 mg by mouth at bedtime.     benazepril (LOTENSIN) 40 MG tablet TAKE 1 TABLET BY MOUTH ONCE DAILY 90 tablet 3   clonazePAM (KLONOPIN) 0.5 MG tablet Take 0.5 mg by mouth 3 (three) times daily as needed for anxiety.      Ibuprofen (ADVIL) 200 MG CAPS Take 600 mg by mouth 3 (three) times daily as needed (pain).     metoprolol tartrate (LOPRESSOR) 25 MG tablet TAKE 1 TABLET BY MOUTH TWICE DAILY 60 tablet 6   nortriptyline (PAMELOR) 10 MG capsule TAKE 1 CAPSULE BY MOUTH AT BEDTIME FOR 7 DAYS THEN TAKE 2 CAPSULES AT BEDTIME FOR 7 DAYS THEN TAKE 3 CAPSULES AT BEDTIME THEREAFTER.     pantoprazole (PROTONIX) 40 MG tablet  sertraline (ZOLOFT) 100 MG tablet Take 100 mg by mouth daily.     nitroGLYCERIN (NITROSTAT) 0.4 MG SL tablet Place 1 tablet (0.4 mg total) under the tongue every 5 (five) minutes as needed for chest pain. 25 tablet 3   tamsulosin (FLOMAX) 0.4 MG CAPS capsule      No current facility-administered medications for this encounter.     REVIEW OF SYSTEMS:  A 15 point review of systems is documented in the electronic medical record. This was obtained by the nursing  staff. However, I reviewed this with the patient to discuss relevant findings and make appropriate changes.  A comprehensive review of systems was negative..  The patient completed an IPSS and IIEF questionnaire.  His IPSS score was 20 indicating severe urinary outflow obstructive symptoms.  He reports urinary leakage and incontinence, nocturia, hesitancy, and the feeling of not emptying his bladder completely.  He indicated that his erectile function is unable to complete sexual activity.   PHYSICAL EXAM: This patient is in no acute distress.  He is alert and oriented. His height is 6\' 1"  (1.854 m) and weight is 210 lb (95.3 kg).  He exhibits no respiratory distress or labored breathing.  He appears neurologically intact.  His mood is pleasant.  His affect is appropriate.  Please note the digital rectal exam findings described above.  KPS = 90  100 - Normal; no complaints; no evidence of disease. 90   - Able to carry on normal activity; minor signs or symptoms of disease. 80   - Normal activity with effort; some signs or symptoms of disease. 92   - Cares for self; unable to carry on normal activity or to do active work. 60   - Requires occasional assistance, but is able to care for most of his personal needs. 50   - Requires considerable assistance and frequent medical care. 59   - Disabled; requires special care and assistance. 95   - Severely disabled; hospital admission is indicated although death not imminent. 33   - Very sick; hospital admission necessary; active supportive treatment necessary. 10   - Moribund; fatal processes progressing rapidly. 0     - Dead  Karnofsky DA, Abelmann [place], Craver LS and Burchenal JH [phone]) The use of the nitrogen mustards in the palliative treatment of carcinoma: with particular reference to bronchogenic carcinoma Cancer 1 634-56   LABORATORY DATA:  Lab Results  Component Value Date   WBC 5.7 11/26/2013   HGB 13.7 11/26/2013   HCT 38.8 (L) 11/26/2013    MCV 93.7 11/26/2013   PLT 204 11/26/2013   Lab Results  Component Value Date   NA 136 11/26/2013   K 3.5 11/26/2013   CL 103 11/26/2013   CO2 26 11/26/2013   Lab Results  Component Value Date   ALT 27 11/26/2013   AST 27 11/26/2013   ALKPHOS 74 11/26/2013   BILITOT 0.4 11/26/2013     RADIOGRAPHY: Ct Abdomen Pelvis W Wo Contrast  Result Date: 03/23/2018 CLINICAL DATA:  78 year old male with history of enlarged prostate gland. EXAM: CT ABDOMEN AND PELVIS WITHOUT AND WITH CONTRAST TECHNIQUE: Multidetector CT imaging of the abdomen and pelvis was performed following the standard protocol before and following the bolus administration of intravenous contrast. CONTRAST:  151mL OMNIPAQUE IOHEXOL 300 MG/ML  SOLN COMPARISON:  CT the abdomen and pelvis 07/31/2009. FINDINGS: Lower chest: Atherosclerotic calcifications in the descending thoracic aorta and the right coronary artery. Hepatobiliary: No suspicious cystic or solid hepatic lesions.  No intra or extrahepatic biliary ductal dilatation. Gallbladder is normal in appearance. Pancreas: No pancreatic mass. No pancreatic ductal dilatation. No pancreatic or peripancreatic fluid or inflammatory changes. Spleen: Unremarkable. Adrenals/Urinary Tract: Although there are several calcifications associated with both renal hila, there appear to be vascular. No definite calculi are identified within either renal collecting system, along the course of either ureter, or within the lumen of the urinary bladder. No hydroureteronephrosis. Several low-attenuation lesions are noted in both kidneys, compatible with simple cysts, largest of which is exophytic in the lower pole of the right kidney measuring 3.4 cm in diameter other subcentimeter low-attenuation lesions in both kidneys, too small to characterize, but statistically likely to represent tiny cysts. Postcontrast delayed images demonstrate no definite filling defects within the collecting system of either kidney,  along the course of either ureter, or within the lumen of the urinary bladder. Urinary bladder is normal in appearance. Stomach/Bowel: Normal appearance of the stomach. No pathologic dilatation of small bowel or colon. The appendix is not confidently identified and may be surgically absent. Regardless, there are no inflammatory changes noted adjacent to the cecum to suggest the presence of an acute appendicitis at this time. Vascular/Lymphatic: Extensive aortic atherosclerosis with fusiform aneurysmal dilatation of the infrarenal abdominal aorta which measures up to 3.2 x 3.4 cm. Circumaortic left renal vein (normal anatomical variant). No lymphadenopathy noted in the abdomen or pelvis. Reproductive: Prostate gland and seminal vesicles are unremarkable in appearance. Other: No significant volume of ascites.  No pneumoperitoneum. Musculoskeletal: There are no aggressive appearing lytic or blastic lesions noted in the visualized portions of the skeleton. Chronic T1 compression fracture with 50% loss of anterior vertebral body height, unchanged compared to 2011. IMPRESSION: 1. No acute findings are noted in the abdomen or pelvis. 2. No evidence to suggest metastatic disease in the abdomen or pelvis. 3. Aortic atherosclerosis, in addition to at least right coronary artery disease. Assessment for potential risk factor modification, dietary therapy or pharmacologic therapy may be warranted, if clinically indicated. 4. Fusiform aneurysmal dilatation of the infrarenal abdominal aorta (3.2 x 3.4 cm). Recommend followup by ultrasound in 3 years. This recommendation follows ACR consensus guidelines: White Paper of the ACR Incidental Findings Committee II on Vascular Findings. J Am Coll Radiol 2013; 10:789-794. 5. Additional incidental findings, as above. Electronically Signed   By: Vinnie Langton M.D.   On: 03/23/2018 14:38   Dg Finger Index Left  Result Date: 04/09/2018 CLINICAL DATA:  Saw injury to index finger. EXAM:  LEFT INDEX FINGER 2+V COMPARISON:  None. FINDINGS: No acute fracture or dislocation. Joint spaces are preserved. Bone mineralization is normal. Soft tissue irregularity and laceration along the dorsal aspect of the distal index finger. No radiopaque foreign body identified. IMPRESSION: 1. Dorsal distal index finger soft tissue injury. No acute osseous abnormality or radiopaque foreign body. Electronically Signed   By: Titus Dubin M.D.   On: 04/09/2018 17:58      IMPRESSION: 78 y.o. gentleman with Stage T1c adenocarcinoma of the prostate with Gleason Score of 4+3, and PSA of 20.1. We discussed the patient's workup and outlines the nature of prostate cancer in this setting. The patient's PSA technically puts him into the high risk group. Accordingly, he is eligible for a variety of potential treatment options including 8 weeks of external radiation or 5 weeks of external radiation with a brachytherapy boost. We discussed the available radiation techniques, and focused on the details and logistics and delivery. We discussed and outlined the risks,  benefits, short and long-term effects associated with radiotherapy and compared and contrasted these with prostatectomy. We discussed the role of SpaceOAR in reducing the rectal toxicity associated with radiotherapy.   We also detailed the role of long term ADT in the treatment of high risk prostate cancer and outlined the associated side effects that could be expected with this therapy.  This visit was conducted via telephone to spare the patient unnecessary potential exposure in the healthcare setting during the current COVID-19 pandemic.  PLAN: At the end of our conversation, the patient would like to move forward with 8 weeks of external beam therapy in combination with ADT. He has not yet received his first Lupron injection. We will contact [facility] to make arrangements for start of ADT and fiducial marker with SpaceOAR to reduce rectal toxicity  from radiotherapy placement prior to simulation. He will be scheduled for simulation in the near future. We will share our discussion with Dr. Alyson Ingles and move forward with treatment planning in anticipation of beginning IMRT in the near future.  Given current concerns for patient exposure during the COVID-19 pandemic, this encounter was conducted via telephone. The patient was notified in advance and was offered a [place] meeting to allow for face to face communication but unfortunately reported that he did not have the appropriate resources/technology to support such a visit and instead preferred to proceed with telephone consult. The patient has given verbal consent for this type of encounter. The time spent during this encounter was 40 minutes. The attendants for this meeting include Tyler Pita MD, [place], and patient Alex Lara. During the encounter, Tyler Pita, MD and scribe, Wilburn Mylar were located at [facility] Radiation Oncology Department.  Patient Alex Lara was located at home.    ------------------------------------------------  Sheral Apley. Tammi Klippel, M.D.   This document serves as a record of services personally performed by Tyler Pita, MD. It was created on his behalf by Wilburn Mylar, a trained medical scribe. The creation of this record is based on the scribe's personal observations and the provider's statements to them. This document has been checked and approved by the attending provider.

## 2018-04-22 DIAGNOSIS — C61 Malignant neoplasm of prostate: Secondary | ICD-10-CM | POA: Insufficient documentation

## 2018-04-23 ENCOUNTER — Telehealth: Payer: Self-pay | Admitting: *Deleted

## 2018-04-23 NOTE — Telephone Encounter (Signed)
CALLED PATIENT TO INFORM OF ADT APPT. WITH DR. Alyson Ingles ON 04-25-18 IN [place] - ARRIVAL TIME - 1:15 PM, SPOKE WITH PATIENT AND HE IS AWARE OF THIS APPT.

## 2018-04-25 ENCOUNTER — Ambulatory Visit (INDEPENDENT_AMBULATORY_CARE_PROVIDER_SITE_OTHER): Payer: Medicare HMO | Admitting: Urology

## 2018-04-25 DIAGNOSIS — C61 Malignant neoplasm of prostate: Secondary | ICD-10-CM | POA: Diagnosis not present

## 2018-04-26 ENCOUNTER — Telehealth: Payer: Self-pay | Admitting: Medical Oncology

## 2018-04-26 NOTE — Telephone Encounter (Signed)
Called Mr. Coole and spoke with his wife to introduce myself as the prostate nurse navigator and my role. I was unable to meet him due to consult being done by telephone due to current COVID-19 concerns for limiting patient exposure. She states the consult went well and he received his hormone injections yesterday. She said the sites are red and hard. I discussed this is a normal reaction and these side effects will lessen with time. I asked her to call with questions or concerns and she voiced understanding.

## 2018-06-06 ENCOUNTER — Other Ambulatory Visit (HOSPITAL_COMMUNITY): Payer: Self-pay | Admitting: Urology

## 2018-06-06 ENCOUNTER — Ambulatory Visit (INDEPENDENT_AMBULATORY_CARE_PROVIDER_SITE_OTHER): Payer: Medicare HMO | Admitting: Urology

## 2018-06-06 DIAGNOSIS — C61 Malignant neoplasm of prostate: Secondary | ICD-10-CM

## 2018-06-20 ENCOUNTER — Ambulatory Visit (HOSPITAL_COMMUNITY)
Admission: RE | Admit: 2018-06-20 | Discharge: 2018-06-20 | Disposition: A | Discharge status: Home / Self Care | Payer: Medicare HMO | Source: Ambulatory Visit | Attending: Urology | Admitting: Urology

## 2018-06-20 ENCOUNTER — Encounter (HOSPITAL_COMMUNITY): Payer: Self-pay

## 2018-06-20 ENCOUNTER — Other Ambulatory Visit: Payer: Self-pay

## 2018-06-20 DIAGNOSIS — C61 Malignant neoplasm of prostate: Secondary | ICD-10-CM

## 2018-06-20 MED ORDER — GENTAMICIN SULFATE 40 MG/ML IJ SOLN
80.0000 mg | Freq: Once | INTRAMUSCULAR | Status: AC
  Administered 2018-06-20: 80 mg via INTRAMUSCULAR

## 2018-06-20 MED ORDER — GENTAMICIN SULFATE 40 MG/ML IJ SOLN
INTRAMUSCULAR | Status: AC
  Administered 2018-06-20: 80 mg via INTRAMUSCULAR
  Filled 2018-06-20: qty 2

## 2018-06-20 MED ORDER — LIDOCAINE HCL (PF) 2 % IJ SOLN
INTRAMUSCULAR | Status: AC
  Filled 2018-06-20: qty 10

## 2018-07-02 ENCOUNTER — Telehealth: Payer: Self-pay | Admitting: *Deleted

## 2018-07-02 NOTE — Telephone Encounter (Signed)
CALLED PATIENT TO INFORM OF SIM APPT. ON 07-06-18 - ARRIVAL TIME- 2:15 PM @ [place], SPOKE WITH PATIENT'S WIFE

## 2018-07-02 NOTE — Telephone Encounter (Signed)
CALLED PATIENT TO INFORM OF SIM APPT. ON 07-06-18 @ 2:30 PM @ [place], NO ANSWER, UNABLE TO LEAVE MESSAGE

## 2018-07-04 ENCOUNTER — Encounter: Payer: Self-pay | Admitting: Urology

## 2018-07-04 NOTE — Progress Notes (Signed)
Patient had fiducial markers only, placed with Dr. Alyson Ingles on 06/20/18 so he will not have a prostate MRI and will need urethral contrast at time of CT [facility] 07/06/18.  Nicholos Johns, MMS, PA-C [place] at [place]: [phone]  Fax: [phone]

## 2018-07-05 ENCOUNTER — Telehealth: Payer: Self-pay | Admitting: *Deleted

## 2018-07-05 NOTE — Telephone Encounter (Signed)
CALLED PATIENT TO REMIND OF SIM APPT. FOR 07-06-18 @ DR. MANNING'S OFFICE, NO ANSWER

## 2018-07-05 NOTE — Telephone Encounter (Signed)
Called patient to remind of sim appt. for 07-06-18 - arrival time- 2:15 pm @ [place], spoke with patient's wife - Blanch Media and she is aware of this appt.

## 2018-07-06 ENCOUNTER — Encounter: Payer: Self-pay | Admitting: Urology

## 2018-07-06 ENCOUNTER — Ambulatory Visit: Payer: Medicare HMO | Admitting: Radiation Oncology

## 2018-07-06 ENCOUNTER — Other Ambulatory Visit: Payer: Self-pay | Admitting: Urology

## 2018-07-06 DIAGNOSIS — C61 Malignant neoplasm of prostate: Secondary | ICD-10-CM

## 2018-07-06 NOTE — Progress Notes (Signed)
Patient scheduled for CT Sim for prostate IMRT today at 2:30 PM but his wife called to inform Alex Lara that he wants to have his treatment in [place] which is closer to his home.  [place] will call them back and advise that they do not need to come today for CT simulation as we will need to make a referral for him to have a consult visit with Dr. Francesca Jewett at [facility] and move forward with treatment planning at that facility.  Nicholos Johns, MMS, PA-C [place] at [place]: [phone]  Fax: [phone]

## 2018-07-10 ENCOUNTER — Ambulatory Visit: Payer: Medicare HMO | Admitting: Urology

## 2018-11-29 ENCOUNTER — Ambulatory Visit (INDEPENDENT_AMBULATORY_CARE_PROVIDER_SITE_OTHER): Payer: Medicare HMO | Admitting: Otolaryngology

## 2018-12-31 ENCOUNTER — Other Ambulatory Visit: Payer: Self-pay | Admitting: Cardiology

## 2019-01-30 ENCOUNTER — Inpatient Hospital Stay (HOSPITAL_COMMUNITY)
Admission: EM | Admit: 2019-01-30 | Discharge: 2019-01-31 | DRG: 066 | Disposition: A | Discharge status: Home / Self Care | Payer: Medicare Other | Attending: Internal Medicine | Admitting: Internal Medicine

## 2019-01-30 ENCOUNTER — Other Ambulatory Visit (HOSPITAL_COMMUNITY): Payer: Medicare Other

## 2019-01-30 ENCOUNTER — Observation Stay (HOSPITAL_COMMUNITY): Payer: Medicare Other

## 2019-01-30 ENCOUNTER — Emergency Department (HOSPITAL_COMMUNITY): Payer: Medicare Other

## 2019-01-30 ENCOUNTER — Other Ambulatory Visit: Payer: Self-pay

## 2019-01-30 DIAGNOSIS — Z8546 Personal history of malignant neoplasm of prostate: Secondary | ICD-10-CM | POA: Diagnosis not present

## 2019-01-30 DIAGNOSIS — Z20822 Contact with and (suspected) exposure to covid-19: Secondary | ICD-10-CM | POA: Diagnosis not present

## 2019-01-30 DIAGNOSIS — I517 Cardiomegaly: Secondary | ICD-10-CM | POA: Diagnosis not present

## 2019-01-30 DIAGNOSIS — E7800 Pure hypercholesterolemia, unspecified: Secondary | ICD-10-CM | POA: Diagnosis not present

## 2019-01-30 DIAGNOSIS — Z720 Tobacco use: Secondary | ICD-10-CM | POA: Diagnosis not present

## 2019-01-30 DIAGNOSIS — R4781 Slurred speech: Secondary | ICD-10-CM | POA: Diagnosis not present

## 2019-01-30 DIAGNOSIS — I6389 Other cerebral infarction: Principal | ICD-10-CM | POA: Diagnosis present

## 2019-01-30 DIAGNOSIS — F418 Other specified anxiety disorders: Secondary | ICD-10-CM | POA: Diagnosis not present

## 2019-01-30 DIAGNOSIS — R29701 NIHSS score 1: Secondary | ICD-10-CM | POA: Diagnosis present

## 2019-01-30 DIAGNOSIS — Z791 Long term (current) use of non-steroidal anti-inflammatories (NSAID): Secondary | ICD-10-CM

## 2019-01-30 DIAGNOSIS — R2990 Unspecified symptoms and signs involving the nervous system: Secondary | ICD-10-CM | POA: Diagnosis present

## 2019-01-30 DIAGNOSIS — F329 Major depressive disorder, single episode, unspecified: Secondary | ICD-10-CM | POA: Diagnosis present

## 2019-01-30 DIAGNOSIS — F419 Anxiety disorder, unspecified: Secondary | ICD-10-CM | POA: Diagnosis present

## 2019-01-30 DIAGNOSIS — Z79899 Other long term (current) drug therapy: Secondary | ICD-10-CM

## 2019-01-30 DIAGNOSIS — Z885 Allergy status to narcotic agent status: Secondary | ICD-10-CM | POA: Diagnosis not present

## 2019-01-30 DIAGNOSIS — R270 Ataxia, unspecified: Secondary | ICD-10-CM | POA: Diagnosis present

## 2019-01-30 DIAGNOSIS — Z801 Family history of malignant neoplasm of trachea, bronchus and lung: Secondary | ICD-10-CM | POA: Diagnosis not present

## 2019-01-30 DIAGNOSIS — I2510 Atherosclerotic heart disease of native coronary artery without angina pectoris: Secondary | ICD-10-CM

## 2019-01-30 DIAGNOSIS — N138 Other obstructive and reflux uropathy: Secondary | ICD-10-CM

## 2019-01-30 DIAGNOSIS — Z8249 Family history of ischemic heart disease and other diseases of the circulatory system: Secondary | ICD-10-CM

## 2019-01-30 DIAGNOSIS — C61 Malignant neoplasm of prostate: Secondary | ICD-10-CM | POA: Diagnosis present

## 2019-01-30 DIAGNOSIS — Z96642 Presence of left artificial hip joint: Secondary | ICD-10-CM | POA: Diagnosis not present

## 2019-01-30 DIAGNOSIS — Z803 Family history of malignant neoplasm of breast: Secondary | ICD-10-CM | POA: Diagnosis not present

## 2019-01-30 DIAGNOSIS — N400 Enlarged prostate without lower urinary tract symptoms: Secondary | ICD-10-CM | POA: Diagnosis present

## 2019-01-30 DIAGNOSIS — I10 Essential (primary) hypertension: Secondary | ICD-10-CM | POA: Diagnosis not present

## 2019-01-30 DIAGNOSIS — Z87891 Personal history of nicotine dependence: Secondary | ICD-10-CM | POA: Diagnosis not present

## 2019-01-30 DIAGNOSIS — Z808 Family history of malignant neoplasm of other organs or systems: Secondary | ICD-10-CM | POA: Diagnosis not present

## 2019-01-30 DIAGNOSIS — R471 Dysarthria and anarthria: Secondary | ICD-10-CM | POA: Diagnosis present

## 2019-01-30 DIAGNOSIS — E785 Hyperlipidemia, unspecified: Secondary | ICD-10-CM

## 2019-01-30 DIAGNOSIS — I639 Cerebral infarction, unspecified: Secondary | ICD-10-CM | POA: Diagnosis present

## 2019-01-30 DIAGNOSIS — G4700 Insomnia, unspecified: Secondary | ICD-10-CM | POA: Diagnosis not present

## 2019-01-30 DIAGNOSIS — H9190 Unspecified hearing loss, unspecified ear: Secondary | ICD-10-CM | POA: Diagnosis present

## 2019-01-30 DIAGNOSIS — K219 Gastro-esophageal reflux disease without esophagitis: Secondary | ICD-10-CM | POA: Diagnosis not present

## 2019-01-30 DIAGNOSIS — F17210 Nicotine dependence, cigarettes, uncomplicated: Secondary | ICD-10-CM | POA: Diagnosis present

## 2019-01-30 DIAGNOSIS — I351 Nonrheumatic aortic (valve) insufficiency: Secondary | ICD-10-CM | POA: Diagnosis not present

## 2019-01-30 DIAGNOSIS — Z03818 Encounter for observation for suspected exposure to other biological agents ruled out: Secondary | ICD-10-CM | POA: Diagnosis not present

## 2019-01-30 DIAGNOSIS — I6523 Occlusion and stenosis of bilateral carotid arteries: Secondary | ICD-10-CM | POA: Diagnosis not present

## 2019-01-30 DIAGNOSIS — I6621 Occlusion and stenosis of right posterior cerebral artery: Secondary | ICD-10-CM | POA: Diagnosis not present

## 2019-01-30 LAB — CBC
HCT: 37.3 % — ABNORMAL LOW (ref 39.0–52.0)
Hemoglobin: 12.4 g/dL — ABNORMAL LOW (ref 13.0–17.0)
MCH: 30.8 pg (ref 26.0–34.0)
MCHC: 33.2 g/dL (ref 30.0–36.0)
MCV: 92.8 fL (ref 80.0–100.0)
Platelets: 146 10*3/uL — ABNORMAL LOW (ref 150–400)
RBC: 4.02 MIL/uL — ABNORMAL LOW (ref 4.22–5.81)
RDW: 15 % (ref 11.5–15.5)
WBC: 5.5 10*3/uL (ref 4.0–10.5)
nRBC: 0 % (ref 0.0–0.2)

## 2019-01-30 LAB — URINALYSIS, ROUTINE W REFLEX MICROSCOPIC
Bilirubin Urine: NEGATIVE
Glucose, UA: NEGATIVE mg/dL
Hgb urine dipstick: NEGATIVE
Ketones, ur: NEGATIVE mg/dL
Leukocytes,Ua: NEGATIVE
Nitrite: NEGATIVE
Protein, ur: NEGATIVE mg/dL
Specific Gravity, Urine: 1.01 (ref 1.005–1.030)
pH: 6 (ref 5.0–8.0)

## 2019-01-30 LAB — DIFFERENTIAL
Abs Immature Granulocytes: 0.05 10*3/uL (ref 0.00–0.07)
Basophils Absolute: 0.1 10*3/uL (ref 0.0–0.1)
Basophils Relative: 1 %
Eosinophils Absolute: 0.6 10*3/uL — ABNORMAL HIGH (ref 0.0–0.5)
Eosinophils Relative: 11 %
Immature Granulocytes: 1 %
Lymphocytes Relative: 21 %
Lymphs Abs: 1.2 10*3/uL (ref 0.7–4.0)
Monocytes Absolute: 0.7 10*3/uL (ref 0.1–1.0)
Monocytes Relative: 13 %
Neutro Abs: 2.9 10*3/uL (ref 1.7–7.7)
Neutrophils Relative %: 53 %

## 2019-01-30 LAB — ETHANOL: Alcohol, Ethyl (B): <10 mg/dL (ref <10)

## 2019-01-30 LAB — RAPID URINE DRUG SCREEN, HOSP PERFORMED
Amphetamines: NOT DETECTED
Barbiturates: NOT DETECTED
Benzodiazepines: NOT DETECTED
Cocaine: NOT DETECTED
Opiates: NOT DETECTED
Tetrahydrocannabinol: NOT DETECTED

## 2019-01-30 LAB — I-STAT CHEM 8, ED
BUN: 16 mg/dL (ref 8–23)
Calcium, Ion: 1.19 mmol/L (ref 1.15–1.40)
Chloride: 101 mmol/L (ref 98–111)
Creatinine, Ser: 1.1 mg/dL (ref 0.61–1.24)
Glucose, Bld: 100 mg/dL — ABNORMAL HIGH (ref 70–99)
HCT: 36 % — ABNORMAL LOW (ref 39.0–52.0)
Hemoglobin: 12.2 g/dL — ABNORMAL LOW (ref 13.0–17.0)
Potassium: 3.5 mmol/L (ref 3.5–5.1)
Sodium: 139 mmol/L (ref 135–145)
TCO2: 25 mmol/L (ref 22–32)

## 2019-01-30 LAB — COMPREHENSIVE METABOLIC PANEL
ALT: 15 U/L (ref 0–44)
AST: 16 U/L (ref 15–41)
Albumin: 3.8 g/dL (ref 3.5–5.0)
Alkaline Phosphatase: 88 U/L (ref 38–126)
Anion gap: 9 (ref 5–15)
BUN: 17 mg/dL (ref 8–23)
CO2: 26 mmol/L (ref 22–32)
Calcium: 8.9 mg/dL (ref 8.9–10.3)
Chloride: 101 mmol/L (ref 98–111)
Creatinine, Ser: 1 mg/dL (ref 0.61–1.24)
GFR calc Af Amer: >60 mL/min (ref >60)
GFR calc non Af Amer: >60 mL/min (ref >60)
Glucose, Bld: 104 mg/dL — ABNORMAL HIGH (ref 70–99)
Potassium: 3.4 mmol/L — ABNORMAL LOW (ref 3.5–5.1)
Sodium: 136 mmol/L (ref 135–145)
Total Bilirubin: 0.6 mg/dL (ref 0.3–1.2)
Total Protein: 6.8 g/dL (ref 6.5–8.1)

## 2019-01-30 LAB — VITAMIN B12: Vitamin B-12: 203 pg/mL (ref 180–914)

## 2019-01-30 LAB — RESPIRATORY PANEL BY RT PCR (FLU A&B, COVID)
Influenza A by PCR: NEGATIVE
Influenza B by PCR: NEGATIVE
SARS Coronavirus 2 by RT PCR: NEGATIVE

## 2019-01-30 LAB — PROTIME-INR
INR: 1 (ref 0.8–1.2)
Prothrombin Time: 12.9 seconds (ref 11.4–15.2)

## 2019-01-30 LAB — APTT: aPTT: 29 seconds (ref 24–36)

## 2019-01-30 LAB — TSH: TSH: 1.836 u[IU]/mL (ref 0.350–4.500)

## 2019-01-30 MED ORDER — NICOTINE 21 MG/24HR TD PT24
21.0000 mg | MEDICATED_PATCH | Freq: Every day | TRANSDERMAL | Status: DC
  Administered 2019-01-30 – 2019-01-31 (×2): 21 mg via TRANSDERMAL
  Filled 2019-01-30 (×2): qty 1

## 2019-01-30 MED ORDER — ACETAMINOPHEN 160 MG/5ML PO SOLN: 650.0000 mg | ORAL | Status: DC | PRN

## 2019-01-30 MED ORDER — SERTRALINE HCL 50 MG PO TABS
100.0000 mg | ORAL_TABLET | Freq: Every evening | ORAL | Status: DC
  Administered 2019-01-30 – 2019-01-31 (×2): 100 mg via ORAL
  Filled 2019-01-30 (×3): qty 2

## 2019-01-30 MED ORDER — STROKE: EARLY STAGES OF RECOVERY BOOK
Freq: Once | Status: DC
  Filled 2019-01-30: qty 1

## 2019-01-30 MED ORDER — ENOXAPARIN SODIUM 40 MG/0.4ML ~~LOC~~ SOLN
40.0000 mg | SUBCUTANEOUS | Status: DC
  Administered 2019-01-30 – 2019-01-31 (×2): 40 mg via SUBCUTANEOUS
  Filled 2019-01-30 (×3): qty 0.4

## 2019-01-30 MED ORDER — ACETAMINOPHEN 650 MG RE SUPP: 650.0000 mg | RECTAL | Status: DC | PRN

## 2019-01-30 MED ORDER — METOPROLOL TARTRATE 25 MG PO TABS
25.0000 mg | ORAL_TABLET | Freq: Two times a day (BID) | ORAL | Status: DC
  Administered 2019-01-30 – 2019-01-31 (×2): 25 mg via ORAL
  Filled 2019-01-30 (×2): qty 1

## 2019-01-30 MED ORDER — ATORVASTATIN CALCIUM 20 MG PO TABS
20.0000 mg | ORAL_TABLET | Freq: Every day | ORAL | Status: DC
  Filled 2019-01-30: qty 1

## 2019-01-30 MED ORDER — ASPIRIN 300 MG RE SUPP: 300.0000 mg | Freq: Every day | RECTAL | Status: DC

## 2019-01-30 MED ORDER — POTASSIUM CHLORIDE CRYS ER 20 MEQ PO TBCR
20.0000 meq | EXTENDED_RELEASE_TABLET | Freq: Every evening | ORAL | Status: DC
  Administered 2019-01-30 – 2019-01-31 (×2): 20 meq via ORAL
  Filled 2019-01-30 (×4): qty 1

## 2019-01-30 MED ORDER — SENNOSIDES-DOCUSATE SODIUM 8.6-50 MG PO TABS
1.0000 | ORAL_TABLET | Freq: Every evening | ORAL | Status: DC | PRN
  Filled 2019-01-30: qty 1

## 2019-01-30 MED ORDER — MELATONIN 3 MG PO TABS
3.0000 mg | ORAL_TABLET | Freq: Every day | ORAL | Status: DC
  Administered 2019-01-30: 3 mg via ORAL
  Filled 2019-01-30: qty 1

## 2019-01-30 MED ORDER — ACETAMINOPHEN 325 MG PO TABS: 650.0000 mg | ORAL_TABLET | ORAL | Status: DC | PRN

## 2019-01-30 MED ORDER — SODIUM CHLORIDE 0.9 % IV SOLN: INTRAVENOUS | Status: DC

## 2019-01-30 MED ORDER — CLONAZEPAM 0.5 MG PO TABS: 0.5000 mg | ORAL_TABLET | Freq: Three times a day (TID) | ORAL | Status: DC | PRN

## 2019-01-30 MED ORDER — ASPIRIN 325 MG PO TABS
325.0000 mg | ORAL_TABLET | Freq: Every day | ORAL | Status: DC
  Administered 2019-01-30 – 2019-01-31 (×2): 325 mg via ORAL
  Filled 2019-01-30 (×2): qty 1

## 2019-01-30 MED ORDER — NORTRIPTYLINE HCL 10 MG PO CAPS
30.0000 mg | ORAL_CAPSULE | Freq: Every day | ORAL | Status: DC
  Administered 2019-01-30: 30 mg via ORAL
  Filled 2019-01-30 (×3): qty 3

## 2019-01-30 MED ORDER — BENAZEPRIL HCL 10 MG PO TABS
40.0000 mg | ORAL_TABLET | Freq: Every day | ORAL | Status: DC
  Administered 2019-01-30 – 2019-01-31 (×2): 40 mg via ORAL
  Filled 2019-01-30 (×2): qty 4

## 2019-01-30 MED ORDER — PANTOPRAZOLE SODIUM 40 MG PO TBEC
40.0000 mg | DELAYED_RELEASE_TABLET | Freq: Every day | ORAL | Status: DC
  Administered 2019-01-30 – 2019-01-31 (×2): 40 mg via ORAL
  Filled 2019-01-30 (×2): qty 1

## 2019-01-30 MED ORDER — TAMSULOSIN HCL 0.4 MG PO CAPS
0.4000 mg | ORAL_CAPSULE | Freq: Every evening | ORAL | Status: DC
  Administered 2019-01-30 – 2019-01-31 (×2): 0.4 mg via ORAL
  Filled 2019-01-30 (×3): qty 1

## 2019-01-30 NOTE — ED Notes (Signed)
Wife says pt recently finished radiation treatment for prostate cancer and is due a hormone shot  Jan 29.

## 2019-01-30 NOTE — ED Provider Notes (Signed)
[facility] EMERGENCY DEPARTMENT Provider Note   CSN: [id number] Arrival date & time: 01/30/19  1324  An emergency department physician performed an initial assessment on this suspected stroke patient at 1335.  History No chief complaint on file.   Alex Lara. is a 79 y.o. male.  Patient went to bed around midnight and then woke up around 10 with some slurred speech.  His wife came home and found him with mild slurred speech and to prompt emergency department  The history is provided by the patient and a relative. No language interpreter was used.  Weakness Severity:  Mild Onset quality:  Sudden Timing:  Constant Progression:  Waxing and waning Chronicity:  New Context: not alcohol use   Relieved by:  Nothing Worsened by:  Nothing Ineffective treatments:  None tried Associated symptoms: no abdominal pain, no chest pain, no cough, no diarrhea, no frequency, no headaches and no seizures        Past Medical History:  Diagnosis Date  . Anxiety and depression   . Arteriosclerotic cardiovascular disease (ASCVD)    06/2008: DES to the RCA and LAD  . 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 Carpal tunnel syndrome   . Chronic anticoagulation   . Deep vein thrombosis ([place])   . Degenerative joint disease   . Gastroesophageal reflux disease   . Hyperlipidemia   . Hypertension   . Prostate cancer ([place])   . Tobacco abuse     Patient Active Problem List   Diagnosis Date Noted  . Stroke-like symptoms 01/30/2019  . Malignant neoplasm of prostate ([place]) 04/22/2018  . Chest tightness 05/04/2016  . Dyspnea on exertion 05/04/2016  . Arteriosclerotic cardiovascular disease (ASCVD)   . Hyperlipidemia   . Tobacco abuse   . Deep vein thrombosis ([place])   . Gastroesophageal reflux disease   . Hypertension   . OSTEOARTHRITIS 02/14/2006    Past Surgical History:  Procedure Laterality Date  . COLONOSCOPY  ?  Date  . INGUINAL HERNIA REPAIR    . ORIF-unknown fracture    . PROSTATE  BIOPSY    . ROTATOR CUFF REPAIR    . TOTAL HIP ARTHROPLASTY  2004   Left       Family History  Problem Relation Age of Onset  . Coronary artery disease Father   . Coronary artery disease Mother        died post-CABG  . Heart disease Mother   . Breast cancer Maternal Aunt   . Lung cancer Cousin   . Melanoma Daughter   . Prostate cancer Neg Hx     Social History   Tobacco Use  . Smoking status: Current Every Day Smoker    Packs/day: 1.50    Years: 55.00    Pack years: 82.50  . Smokeless tobacco: Never Used  Substance Use Topics  . Alcohol use: Yes    Alcohol/week: 0.0 standard drinks    Comment: Patient states he is cutting down. 2 drinks today. none yesterday. History of ETOH abuse  . Drug use: No    Home Medications Prior to Admission medications   Medication Sig Start Date End Date Taking? Authorizing Provider  atorvastatin (LIPITOR) 20 MG tablet Take 20 mg by mouth at bedtime. 01/15/18   [provider]  benazepril (LOTENSIN) 40 MG tablet Take 1 tablet by mouth once daily 12/31/18   Arnoldo Lenis, MD  clonazePAM (KLONOPIN) 0.5 MG tablet Take 0.5 mg by mouth 3 (three) times daily as needed for  anxiety.     [provider]  Ibuprofen (ADVIL) 200 MG CAPS Take 600 mg by mouth 3 (three) times daily as needed (pain).    [provider]  metoprolol tartrate (LOPRESSOR) 25 MG tablet TAKE 1 TABLET BY MOUTH TWICE DAILY 01/01/18   Arnoldo Lenis, MD  nitroGLYCERIN (NITROSTAT) 0.4 MG SL tablet Place 1 tablet (0.4 mg total) under the tongue every 5 (five) minutes as needed for chest pain. 02/16/16 08/19/16  Josue Hector, MD  nortriptyline (PAMELOR) 10 MG capsule TAKE 1 CAPSULE BY MOUTH AT BEDTIME FOR 7 DAYS THEN TAKE 2 CAPSULES AT BEDTIME FOR 7 DAYS THEN TAKE 3 CAPSULES AT BEDTIME THEREAFTER. 02/24/18   [provider]  pantoprazole (PROTONIX) 40 MG tablet  06/09/16   [provider]  sertraline (ZOLOFT) 100 MG tablet Take 100 mg by  mouth daily. 04/04/18   [provider]  tamsulosin (FLOMAX) 0.4 MG CAPS capsule  06/25/16   [provider]    Allergies    Codeine  Review of Systems   Review of Systems  Constitutional: Negative for appetite change and fatigue.  HENT: Negative for congestion, ear discharge and sinus pressure.   Eyes: Negative for discharge.  Respiratory: Negative for cough.   Cardiovascular: Negative for chest pain.  Gastrointestinal: Negative for abdominal pain and diarrhea.  Genitourinary: Negative for frequency and hematuria.  Musculoskeletal: Negative for back pain.  Skin: Negative for rash.  Neurological: Negative for seizures and headaches.       Slurred speech  Psychiatric/Behavioral: Negative for hallucinations.    Physical Exam Updated Vital Signs BP (!) 150/81 (BP Location: Right Arm)   Pulse 65   Temp 97.8 F (36.6 C) (Oral)   Resp 18   SpO2 100%   Physical Exam Vitals and nursing note reviewed.  Constitutional:      Appearance: He is well-developed.  HENT:     Head: Normocephalic.     Nose: Nose normal.  Eyes:     General: No scleral icterus.    Conjunctiva/sclera: Conjunctivae normal.  Neck:     Thyroid: No thyromegaly.  Cardiovascular:     Rate and Rhythm: Normal rate and regular rhythm.     Heart sounds: No murmur. No friction rub. No gallop.   Pulmonary:     Breath sounds: No stridor. No wheezing or rales.  Chest:     Chest wall: No tenderness.  Abdominal:     General: There is no distension.     Tenderness: There is no abdominal tenderness. There is no rebound.  Musculoskeletal:        General: Normal range of motion.     Cervical back: Neck supple.  Lymphadenopathy:     Cervical: No cervical adenopathy.  Skin:    Findings: No erythema or rash.  Neurological:     Mental Status: He is oriented to person, place, and time.     Motor: No abnormal muscle tone.     Coordination: Coordination normal.     Comments: Mild slurred speech   Psychiatric:        Behavior: Behavior normal.     ED Results / Procedures / Treatments   Labs (all labs ordered are listed, but only abnormal results are displayed) Labs Reviewed  CBC - Abnormal; Notable for the following components:      Result Value   RBC 4.02 (*)    Hemoglobin 12.4 (*)    HCT 37.3 (*)    Platelets 146 (*)  All other components within normal limits  DIFFERENTIAL - Abnormal; Notable for the following components:   Eosinophils Absolute 0.6 (*)    All other components within normal limits  COMPREHENSIVE METABOLIC PANEL - Abnormal; Notable for the following components:   Potassium 3.4 (*)    Glucose, Bld 104 (*)    All other components within normal limits  I-STAT CHEM 8, ED - Abnormal; Notable for the following components:   Glucose, Bld 100 (*)    Hemoglobin 12.2 (*)    HCT 36.0 (*)    All other components within normal limits  RESPIRATORY PANEL BY RT PCR (FLU A&B, COVID)  ETHANOL  PROTIME-INR  APTT  RAPID URINE DRUG SCREEN, HOSP PERFORMED  URINALYSIS, ROUTINE W REFLEX MICROSCOPIC    EKG None  Radiology CT HEAD CODE STROKE WO CONTRAST  Result Date: 01/30/2019 CLINICAL DATA:  Code stroke.  Ataxia.  Slurred speech. EXAM: CT HEAD WITHOUT CONTRAST TECHNIQUE: Contiguous axial images were obtained from the base of the skull through the vertex without intravenous contrast. COMPARISON:  CT head 11/05/2013 FINDINGS: Brain: Mild atrophy. Ventricle size normal. Chronic infarct left Corona radiata, not present previously. Mild chronic ischemic change right basal ganglia. Asymmetric calcification right basal ganglia unchanged. Chronic infarct left occipital lobe, new since the prior CT. Negative for acute infarct.  Negative for hemorrhage or mass. Vascular: Negative for hyperdense vessel. Atherosclerotic disease in the carotid and vertebral arteries bilaterally. Skull: Negative Sinuses/Orbits: Negative Other: None ASPECTS ([place] Stroke Program Early CT Score) -  Ganglionic level infarction (caudate, lentiform nuclei, internal capsule, insula, M1-M3 cortex): 7 - Supraganglionic infarction (M4-M6 cortex): 3 Total score (0-10 with 10 being normal): 10 IMPRESSION: 1. No acute abnormality 2. ASPECTS is 10 3. Chronic ischemic change in the left corona radiata. Chronic infarct left occipital lobe. Mild chronic ischemic change right basal ganglia. Negative for hemorrhage. 4. These results were called by telephone at the time of interpretation on 01/30/2019 at 1:48 pm to provider Corda Shutt , who verbally acknowledged these results. Electronically Signed   By: Franchot Gallo M.D.   On: 01/30/2019 13:48    Procedures Procedures (including critical care time)  Medications Ordered in ED Medications   stroke: mapping our early stages of recovery book (has no administration in time range)  0.9 %  sodium chloride infusion (has no administration in time range)  acetaminophen (TYLENOL) tablet 650 mg (has no administration in time range)    Or  acetaminophen (TYLENOL) 160 MG/5ML solution 650 mg (has no administration in time range)    Or  acetaminophen (TYLENOL) suppository 650 mg (has no administration in time range)  senna-docusate (Senokot-S) tablet 1 tablet (has no administration in time range)  enoxaparin (LOVENOX) injection 40 mg (has no administration in time range)  aspirin suppository 300 mg (has no administration in time range)    Or  aspirin tablet 325 mg (has no administration in time range)    ED Course  I have reviewed the triage vital signs and the nursing notes.  Pertinent labs & imaging results that were available during my care of the patient were reviewed by me and considered in my medical decision making (see chart for details).    MDM Rules/Calculators/A&P                      CRITICAL CARE Performed by: Milton Ferguson Total critical care time: 35 minutes Critical care time was exclusive of separately billable procedures and treating  other patients.  Critical care was necessary to treat or prevent imminent or life-threatening deterioration. Critical care was time spent personally by me on the following activities: development of treatment plan with patient and/or surrogate as well as nursing, discussions with consultants, evaluation of patient's response to treatment, examination of patient, obtaining history from patient or surrogate, ordering and performing treatments and interventions, ordering and review of laboratory studies, ordering and review of radiographic studies, pulse oximetry and re-evaluation of patient's condition.  Final Clinical Impression(s) / ED Diagnoses Final diagnoses:  Slurred speech   Patient was seen by neurology and he will be admitted for stroke work-up.  CT scan did not show any acute stroke Rx / DC Orders ED Discharge Orders    None       Milton Ferguson, MD 01/30/19 1501

## 2019-01-30 NOTE — H&P (Signed)
History and Physical    Alex Lara. [id number] DOB: 22-Aug-1940 DOA: 01/30/2019  Referring MD/NP/PA: Dr. Roderic Palau PCP: Asencion Noble, MD  Patient coming from: home  Chief Complaint: Slurred speech and ataxia.  HPI: Alex Lara. is a 79 y.o. male with past medical history significant for anxiety/depression, hypertension, BPH/history of prostate cancer, tobacco abuse, coronary artery disease, hyperlipidemia and gastroesophageal reflux disease; who presented to the emergency department secondary to slurred speech and ataxia. Per wife last time seen normal around midnight. She expressed husband has woken and shorter steps for the last couple of days and on the morning of admission she noticed difficulty finding words and slurred speech. There has not been any complaints of chest pain, shortness of breath, coughing, fever, chills, nausea, vomiting, focal muscular deficits, dysuria, hematuria, melena, hematochezia or any other complaints.  In the ED a CT angiogram of the head was done demonstrating no acute abnormalities. Teleneurology was consulted and patient was found out of therapeutic window for any TPA intervention. Recommendations given for patient to be admitted for TIA/stroke rule out. No improvement in the patient's symptoms were described while in the ED. [facility] was contacted to admit patient for further evaluation and management.  Past Medical/Surgical History: Past Medical History:  Diagnosis Date  . Anxiety and depression   . Arteriosclerotic cardiovascular disease (ASCVD)    06/2008: DES to the RCA and LAD  . 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 Carpal tunnel syndrome   . Chronic anticoagulation   . Deep vein thrombosis ([place])   . Degenerative joint disease   . Gastroesophageal reflux disease   . Hyperlipidemia   . Hypertension   . Prostate cancer ([place])   . Tobacco abuse     Past Surgical History:  Procedure Laterality Date  . COLONOSCOPY  ?  Date  . INGUINAL HERNIA  REPAIR    . ORIF-unknown fracture    . PROSTATE BIOPSY    . ROTATOR CUFF REPAIR    . TOTAL HIP ARTHROPLASTY  2004   Left    Social History:  reports that he has been smoking. He has a 82.50 pack-year smoking history. He has never used smokeless tobacco. He reports current alcohol use. He reports that he does not use drugs.  Allergies: Allergies  Allergen Reactions  . Codeine Nausea And Vomiting and Other (See Comments)    "go fuzzy"    Family History:  Family History  Problem Relation Age of Onset  . Coronary artery disease Father   . Coronary artery disease Mother        died post-CABG  . Heart disease Mother   . Breast cancer Maternal Aunt   . Lung cancer Cousin   . Melanoma Daughter   . Prostate cancer Neg Hx     Prior to Admission medications   Medication Sig Start Date End Date Taking? Authorizing Provider  amoxicillin-clavulanate (AUGMENTIN) 875-125 MG tablet Take 1 tablet by mouth 2 (two) times daily. 10 day course starting on 01/14/2019   Yes [provider]  benazepril (LOTENSIN) 40 MG tablet Take 1 tablet by mouth once daily Patient taking differently: Take 40 mg by mouth daily.  12/31/18  Yes Branch, Alphonse Guild, MD  clonazePAM (KLONOPIN) 0.5 MG tablet Take 0.5 mg by mouth 3 (three) times daily as needed for anxiety.    Yes [provider]  nortriptyline (PAMELOR) 10 MG capsule Take 30 mg by mouth at bedtime.  02/24/18  Yes [provider]  sertraline (ZOLOFT) 100 MG tablet Take 100 mg by mouth daily. 04/04/18  Yes [provider]  tamsulosin (FLOMAX) 0.4 MG CAPS capsule Take 0.4 mg by mouth daily.  06/25/16  Yes [provider]  atorvastatin (LIPITOR) 20 MG tablet Take 20 mg by mouth at bedtime. 01/15/18   [provider]  Ibuprofen (ADVIL) 200 MG CAPS Take 600 mg by mouth 3 (three) times daily as needed (pain).    [provider]  metoprolol tartrate (LOPRESSOR) 25 MG tablet TAKE 1 TABLET BY MOUTH TWICE  DAILY 01/01/18   Arnoldo Lenis, MD  nitroGLYCERIN (NITROSTAT) 0.4 MG SL tablet Place 1 tablet (0.4 mg total) under the tongue every 5 (five) minutes as needed for chest pain. 02/16/16 08/19/16  Josue Hector, MD  pantoprazole (PROTONIX) 40 MG tablet  06/09/16   [provider]    Review of Systems:  Negative except as otherwise mentioned in HPI.   Physical Exam: Vitals:   01/30/19 1400 01/30/19 1423 01/30/19 1436  BP: (!) 144/98 131/77 (!) 150/81  Pulse: 63 65 65  Resp: 20 16 18   Temp: 97.8 F (36.6 C)    TempSrc: Oral    SpO2: 99% 100% 100%   Constitutional: NAD, calm, comfortable; afebrile, denies chest pain, no shortness of breath, no nausea, no vomiting. Difficulty finding words and some slurred speech appreciated on exam. Eyes: PERRL, lids and conjunctivae normal, no icterus, no nystagmus. ENMT: Mucous membranes are moist. Posterior pharynx clear of any exudate or lesions. Patient was hard of hearing Neck: normal, supple, no masses, no thyromegaly, no JVD. Respiratory: clear to auscultation bilaterally, no wheezing, no crackles. Normal respiratory effort. No accessory muscle use. Good oxygen saturation on room air. Cardiovascular: Regular rate and rhythm, no murmurs / rubs / gallops. No extremity edema. 2+ pedal pulses. No carotid bruits.  Abdomen: no tenderness, no masses palpated. No hepatosplenomegaly. Bowel sounds positive.  Musculoskeletal: no clubbing / cyanosis. No joint deformity upper and lower extremities. Good ROM, no contractures. Normal muscle tone.  Skin: no rashes, lesions, ulcers. No petechiae. Neurologic: CN 2-12 grossly intact. Sensation intact, Strength 4/5 in all 4 in the setting of poor effort. Having difficulties finding words and demonstrating slurred speech. Psychiatric: Normal judgment and insight. Alert and oriented x 3. Normal mood.    Labs on Admission: I have personally reviewed the following labs and imaging studies  CBC: Recent Labs   Lab 01/30/19 1343 01/30/19 1350  WBC 5.5  --   NEUTROABS 2.9  --   HGB 12.4* 12.2*  HCT 37.3* 36.0*  MCV 92.8  --   PLT 146*  --    Basic Metabolic Panel: Recent Labs  Lab 01/30/19 1343 01/30/19 1350  NA 136 139  K 3.4* 3.5  CL 101 101  CO2 26  --   GLUCOSE 104* 100*  BUN 17 16  CREATININE 1.00 1.10  CALCIUM 8.9  --    GFR: CrCl cannot be calculated (Unknown ideal weight.).   Liver Function Tests: Recent Labs  Lab 01/30/19 1343  AST 16  ALT 15  ALKPHOS 88  BILITOT 0.6  PROT 6.8  ALBUMIN 3.8   Coagulation Profile: Recent Labs  Lab 01/30/19 1343  INR 1.0   Urine analysis:    Component Value Date/Time   COLORURINE YELLOW 01/30/2019 [place] 01/30/2019 1645   LABSPEC 1.010 01/30/2019 1645   PHURINE 6.0 01/30/2019 [place] 01/30/2019 [place] NEGATIVE 01/30/2019 1645  BILIRUBINUR NEGATIVE 01/30/2019 [place] 01/30/2019 1645   PROTEINUR NEGATIVE 01/30/2019 1645   UROBILINOGEN 0.2 11/05/2013 1908   NITRITE NEGATIVE 01/30/2019 [place] 01/30/2019 1645    Radiological Exams on Admission: DG Chest 1 View  Result Date: 01/30/2019 CLINICAL DATA:  Slurred speech EXAM: CHEST  1 VIEW COMPARISON:  12/07/2015 FINDINGS: Cardiomegaly. Both lungs are clear. The visualized skeletal structures are unremarkable. IMPRESSION: Cardiomegaly without acute abnormality of the lungs in [facility] portable projection. Electronically Signed   By: Eddie Candle M.D.   On: 01/30/2019 15:49   MR ANGIO HEAD WO CONTRAST  Result Date: 01/30/2019 CLINICAL DATA:  Stroke suspected. Slurred speech that was noticed upon waking today at 10 a.m. History of prostate cancer. EXAM: MRI HEAD WITHOUT CONTRAST MRA HEAD WITHOUT CONTRAST TECHNIQUE: Multiplanar, multiecho pulse sequences of the brain and surrounding structures were obtained without intravenous contrast. Angiographic images of the head were obtained using MRA technique  without contrast. COMPARISON:  Noncontrast head CT 01/30/2019, brain MRI 06/26/2017, FINDINGS: MRI HEAD FINDINGS Brain: Intermittent motion degradation. This includes mild motion degradation of the diffusion-weighted imaging. There are several small acute cortical infarcts within the posterolateral left frontal lobe. Additionally, there is a small cortically based infarct in the left occipital lobe which demonstrates diffusion weighted hyperintensity but no corresponding ADC hypointensity, likely late subacute. 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 Chronic lacunar infarcts within the left corona radiata, left thalamus and right basal ganglia were not present on prior MRI. Redemonstrated chronic lacunar infarcts within the right thalamus and within the bilateral cerebellar hemispheres. No evidence of intracranial mass. No midline shift or extra-axial fluid collection. No chronic intracranial blood products. Vascular: Reported separately Skull and upper cervical spine: No focal marrow lesion Sinuses/Orbits: Visualized orbits demonstrate no acute abnormality. Mild ethmoid sinus mucosal thickening. Trace fluid within left mastoid air cells MRA HEAD FINDINGS The intracranial internal carotids are patent without significant stenosis. 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 There is a 1-2 mm inferiorly projecting vascular protrusion arising from the supraclinoid left ICA which may reflect an infundibulum or tiny aneurysm (series 2, image 69). The right middle cerebral artery is patent. Apparent high-grade focal stenosis within the mid to distal M1 right middle cerebral artery (series 101, image 9). There is also high-grade focal stenosis within a proximal right M2 branch vessel. The right anterior cerebral artery is patent without high-grade proximal stenosis. The M1 left middle cerebral artery is patent  without significant stenosis. Atherosclerotic irregularity with attenuated appearance of a proximal to mid left M2 branch vessel. The left anterior cerebral artery is patent without high-grade proximal stenosis. The non dominant intracranial right vertebral artery is developmentally diminutive and appears to terminate predominantly as the right PICA. The dominant intracranial left vertebral artery is patent without significant stenosis, as is the basilar artery. Predominantly fetal origin of the right posterior cerebral artery which is patent without significant proximal stenosis. The left posterior cerebral artery is patent proximally. High-grade focal stenosis within distal P2 left PCA branch. The more distal left posterior cerebral artery demonstrates significant atherosclerotic irregularity. Hypoplastic or absent left posterior communicating artery. IMPRESSION: MRI brain: 1. Motion degraded exam. 2. Several small acute cortical infarcts within the posterolateral left frontal lobe. 3. Small late subacute left occipital lobe cortically based infarct. 4. Background chronic small vessel ischemic disease has progressed since MRI 06/26/2017. Chronic lacunar infarcts within  the left corona radiata, left thalamus and right basal ganglia were not present on this prior exam. Redemonstrated chronic lacunar infarcts within the right thalamus and bilateral cerebellar hemispheres. 5. Moderate generalized parenchymal atrophy MRA head: 1. Intracranial atherosclerotic disease with multifocal stenoses, most notably as follows. 2. High-grade focal stenosis within the mid to distal M1 right middle cerebral artery. There is also high-grade focal stenosis within a proximal right M2 branch vessel. 3. Significant atherosclerotic irregularity with attenuated appearance of a proximal to mid left M2 branch vessel. 4. High-grade focal stenosis within a distal P2 left PCA branch. Significant atherosclerotic irregularity of the left PCA more  distally. 5. 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 6. 1-2 mm infundibulum versus tiny aneurysm arising from the supraclinoid left ICA. Electronically Signed   By: Kellie Simmering DO   On: 01/30/2019 17:32   MR BRAIN WO CONTRAST  Result Date: 01/30/2019 CLINICAL DATA:  Stroke suspected. Slurred speech that was noticed upon waking today at 10 a.m. History of prostate cancer. EXAM: MRI HEAD WITHOUT CONTRAST MRA HEAD WITHOUT CONTRAST TECHNIQUE: Multiplanar, multiecho pulse sequences of the brain and surrounding structures were obtained without intravenous contrast. Angiographic images of the head were obtained using MRA technique without contrast. COMPARISON:  Noncontrast head CT 01/30/2019, brain MRI 06/26/2017, FINDINGS: MRI HEAD FINDINGS Brain: Intermittent motion degradation. This includes mild motion degradation of the diffusion-weighted imaging. There are several small acute cortical infarcts within the posterolateral left frontal lobe. Additionally, there is a small cortically based infarct in the left occipital lobe which demonstrates diffusion weighted hyperintensity but no corresponding ADC hypointensity, likely late subacute. 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 Chronic lacunar infarcts within the left corona radiata, left thalamus and right basal ganglia were not present on prior MRI. Redemonstrated chronic lacunar infarcts within the right thalamus and within the bilateral cerebellar hemispheres. No evidence of intracranial mass. No midline shift or extra-axial fluid collection. No chronic intracranial blood products. Vascular: Reported separately Skull and upper cervical spine: No focal marrow lesion Sinuses/Orbits: Visualized orbits demonstrate no acute abnormality. Mild ethmoid sinus mucosal thickening. Trace fluid within left mastoid air cells MRA HEAD FINDINGS The intracranial internal carotids are patent without significant stenosis. 2 mm  medially projecting vascular protrusion arising from the cavernous left ICA consistent with small aneurysm (series 2, image 61). There is a 1-2 mm inferiorly projecting vascular protrusion arising from the supraclinoid left ICA which may reflect an infundibulum or tiny aneurysm (series 2, image 69). The right middle cerebral artery is patent. Apparent high-grade focal stenosis within the mid to distal M1 right middle cerebral artery (series 101, image 9). There is also high-grade focal stenosis within a proximal right M2 branch vessel. The right anterior cerebral artery is patent without high-grade proximal stenosis. The M1 left middle cerebral artery is patent without significant stenosis. Atherosclerotic irregularity with attenuated appearance of a proximal to mid left M2 branch vessel. The left anterior cerebral artery is patent without high-grade proximal stenosis. The non dominant intracranial right vertebral artery is developmentally diminutive and appears to terminate predominantly as the right PICA. The dominant intracranial left vertebral artery is patent without significant stenosis, as is the basilar artery. Predominantly fetal origin of the right posterior cerebral artery which is patent without significant proximal stenosis. The left posterior cerebral artery is patent proximally. High-grade focal stenosis within distal P2 left PCA branch. The more distal left posterior cerebral artery demonstrates significant atherosclerotic irregularity. Hypoplastic or absent left  posterior communicating artery. IMPRESSION: MRI brain: 1. Motion degraded exam. 2. Several small acute cortical infarcts within the posterolateral left frontal lobe. 3. Small late subacute left occipital lobe cortically based infarct. 4. Background chronic small vessel ischemic disease has progressed since MRI 06/26/2017. Chronic lacunar infarcts within the left corona radiata, left thalamus and right basal ganglia were not present on this  prior exam. Redemonstrated chronic lacunar infarcts within the right thalamus and bilateral cerebellar hemispheres. 5. Moderate generalized parenchymal atrophy MRA head: 1. Intracranial atherosclerotic disease with multifocal stenoses, most notably as follows. 2. High-grade focal stenosis within the mid to distal M1 right middle cerebral artery. There is also high-grade focal stenosis within a proximal right M2 branch vessel. 3. Significant atherosclerotic irregularity with attenuated appearance of a proximal to mid left M2 branch vessel. 4. High-grade focal stenosis within a distal P2 left PCA branch. Significant atherosclerotic irregularity of the left PCA more distally. 5. 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 6. 1-2 mm infundibulum versus tiny aneurysm arising from the supraclinoid left ICA. Electronically Signed   By: Kellie Simmering DO   On: 01/30/2019 17:32   US Carotid Bilateral (at [facility] and [facility] only)  Result Date: 01/30/2019 CLINICAL DATA:  79 year old male with a history of stroke EXAM: BILATERAL CAROTID DUPLEX ULTRASOUND TECHNIQUE: Pearline Cables scale imaging, color Doppler and duplex ultrasound were performed of bilateral carotid and vertebral arteries in the neck. COMPARISON:  None. FINDINGS: Criteria: Quantification of carotid stenosis is based on velocity parameters that correlate the residual internal carotid diameter with NASCET-based stenosis levels, using the diameter of the distal internal carotid lumen as the denominator for stenosis measurement. The following velocity measurements were obtained: RIGHT ICA:  Systolic 0000000 cm/sec, Diastolic 56 cm/sec CCA:  73 cm/sec SYSTOLIC ICA/CCA RATIO:  2.6 ECA:  146 cm/sec LEFT ICA:  Systolic 123XX123 cm/sec, Diastolic 24 cm/sec CCA:  93 cm/sec SYSTOLIC ICA/CCA RATIO:  1.0 ECA:  150 cm/sec Right Brachial SBP: Not acquired Left Brachial SBP: Not acquired RIGHT CAROTID ARTERY: No significant calcifications of the right common carotid artery.  Intermediate waveform maintained. Moderate heterogeneous and partially calcified plaque at the right carotid bifurcation. No significant lumen shadowing. Low resistance waveform of the right ICA. No significant tortuosity. RIGHT VERTEBRAL ARTERY: Late systolic reversal of the vertebral artery waveform with antegrade flow throughout. LEFT CAROTID ARTERY: No significant calcifications of the left common carotid artery. Intermediate waveform maintained. Moderate heterogeneous and partially calcified plaque at the left carotid bifurcation. No significant lumen shadowing. Low resistance waveform of the left ICA. No significant tortuosity. LEFT VERTEBRAL ARTERY:  Antegrade flow with low resistance waveform. IMPRESSION: Right: Heterogeneous and partially calcified plaque at the right carotid bifurcation contributes to 50%-69% stenosis by established duplex criteria. Left: Color duplex indicates moderate heterogeneous and calcified plaque, with no hemodynamically significant stenosis by duplex criteria in the extracranial cerebrovascular circulation. Waveform of the right vertebral artery suggests a developing right subclavian artery stenosis, without reversal. Correlation with bilateral upper extremity systolic blood pressure may be useful. Signed, Dulcy Fanny. Dellia Nims, RPVI [facility] Radiology Electronically Signed   By: Corrie Mckusick D.O.   On: 01/30/2019 16:36   CT HEAD CODE STROKE WO CONTRAST  Result Date: 01/30/2019 CLINICAL DATA:  Code stroke.  Ataxia.  Slurred speech. EXAM: CT HEAD WITHOUT CONTRAST TECHNIQUE: Contiguous axial images were obtained from the base of the skull through the vertex without intravenous contrast. COMPARISON:  CT head 11/05/2013 FINDINGS: Brain:  Mild atrophy. Ventricle size normal. Chronic infarct left Corona radiata, not present previously. Mild chronic ischemic change right basal ganglia. Asymmetric calcification right basal ganglia  unchanged. Chronic infarct left occipital lobe, new since the prior CT. Negative for acute infarct.  Negative for hemorrhage or mass. Vascular: Negative for hyperdense vessel. Atherosclerotic disease in the carotid and vertebral arteries bilaterally. Skull: Negative Sinuses/Orbits: Negative Other: None ASPECTS ([place] Stroke Program Early CT Score) - Ganglionic level infarction (caudate, lentiform nuclei, internal capsule, insula, M1-M3 cortex): 7 - Supraganglionic infarction (M4-M6 cortex): 3 Total score (0-10 with 10 being normal): 10 IMPRESSION: 1. No acute abnormality 2. ASPECTS is 10 3. Chronic ischemic change in the left corona radiata. Chronic infarct left occipital lobe. Mild chronic ischemic change right basal ganglia. Negative for hemorrhage. 4. These results were called by telephone at the time of interpretation on 01/30/2019 at 1:48 pm to provider JOSEPH ZAMMIT , who verbally acknowledged these results. Electronically Signed   By: Franchot Gallo M.D.   On: 01/30/2019 13:48    EKG: Independently reviewed. No acute ischemic changes appreciated. Sinus rhythm.  Assessment/Plan 1-multiple small acute left posterolateral frontal infarcts and subacute left occipital cortical infarcts. -Abnormalities perfectly fitting patient's symptoms. -Admit to telemetry bed -Check carotid Dopplers, echo, TSH, B12, lipid panel and A1c -For now continue on aspirin with Lovenox for DVT prophylaxis. -Neurology service has been consulted and will follow further recommendations -Physical therapy, Occupational Therapy and speech therapy has been consulted. -Patient has passed swallowing test while in the ED and a heart healthy diet has been ordered. -Continue blood pressure medications with allowance for permissive hypertension in the setting of acute stroke. -Continue statins. -Follow clinical response.  2-essential hypertension -Allow for permissive hypertension -Safe to resume home Lotensin and  metoprolol.  3-hyperlipidemia -Follow lipid panel -Continue statins and further adjust as needed.  4-history of coronary artery disease -No chest pain, no shortness of breath -Continue the use of statins, beta-blocker and aspirin -EKG without ischemic changes -Monitoring on telemetry. -follow echo  5-BPH/prostate cancer -Continue the use of Flomax -Patient denies urinary retention symptoms currently. -Continue outpatient follow-up with urology/oncology service.  6-gastroesophageal reflux disease -Continue Protonix  7-history of depression/anxiety -Stable mood -No suicidal ideation or hallucination -Continue the use of as needed Klonopin, Pamelor and Zoloft.  8-history of tobacco abuse -I have discussed tobacco cessation with the patient.  I have counseled the patient regarding the negative impacts of continued tobacco use including but not limited to lung cancer, COPD, and cardiovascular disease.  I have discussed alternatives to tobacco and modalities that may help facilitate tobacco cessation including but not limited to biofeedback, hypnosis, and medications.  Total time spent with tobacco counseling was 5 minutes. -Started on nicotine patch.  9-history of insomnia -Continue melatonin nightly   DVT prophylaxis: Lovenox Code Status: Full code Family Communication: Wife at bedside. Disposition Plan: To be determined. Hopefully able to be discharged back home once stroke work-up completed. Consults called: Neurology service. Admission status: Inpatient, telemetry bed; length of stay presumed to be more than 2 midnights.   Time Spent: 70 minutes.  Barton Dubois MD [facility] Pager [phone]   01/30/2019, 5:40 PM

## 2019-01-30 NOTE — Consult Note (Signed)
[facility] Consult Services   Date of Service:   01/30/2019 13:37:33  Impression:     .  R47.81 - Slurred speech  Comments/Sign-Out: acute onset slurred speech - concerning for stroke with broad potential localization. He is not an alteplase candidate due to LSN >4.5 hours. Recommend admission for stroke workup.  Metrics: Last Known Well: 01/30/2019 00:00:00 [facility] Notification Time: 01/30/2019 13:37:33 Arrival Time: 01/30/2019 13:24:00 Stamp Time: 01/30/2019 13:37:33 Time First Login Attempt: 01/30/2019 13:46:16 Symptoms: left facial weakness and difficulty speaking NIHSS Start Assessment Time: 01/30/2019 13:51:58 Patient is not a candidate for Alteplase/Activase. Patient was not deemed candidate for Alteplase/Activase thrombolytics because of Last Well Known Above 4.5 Hours.  CT head showed no acute hemorrhage or acute core infarct. 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  Clinical Presentation is not Suggestive of Large Vessel Occlusive Disease  ED Physician notified of diagnostic impression and management plan on 01/30/2019 13:59:55  Our recommendations are outlined below.  Recommendations:     .  Activate Stroke Protocol Admission/Order Set     .  Stroke/Telemetry Floor     .  Neuro Checks     .  Bedside Swallow Eval     .  DVT Prophylaxis     .  IV Fluids, Normal Saline     .  Head of Bed 30 Degrees     .  Euglycemia and Avoid Hyperthermia (PRN Acetaminophen)     .  start ASA if CT head is neg for hemorrhage.  Routine Consultation with [facility] for Follow up Care  Sign Out:     .  Discussed with Emergency Department Provider    ------------------------------------------------------------------------------  History of Present Illness: Patient is a 79 year old Male.  Patient was brought by private transportation with symptoms of left facial weakness and difficulty speaking  79  yo man was LSN 0000 last night. When he woke up at 10 am, his wife noted L facial weakness and difficulty speaking. No vision changes. No LOC/convulsion. He takes no blood thinners. He reportedly has had mild strokes in the past, but he has had a stent placed.   Examination: BP(144/98), Pulse(70), Blood Glucose(100) 1A: Level of Consciousness - Alert; keenly responsive + 0 1B: Ask Month and Age - Both Questions Right + 0 1C: Blink Eyes & Squeeze Hands - Performs Both Tasks + 0 2: Test Horizontal Extraocular Movements - Normal + 0 3: Test Visual Fields - No Visual Loss + 0 4: Test Facial Palsy (Use Grimace if Obtunded) - Normal symmetry + 0 5A: Test Left Arm Motor Drift - No Drift for 10 Seconds + 0 5B: Test Right Arm Motor Drift - No Drift for 10 Seconds + 0 6A: Test Left Leg Motor Drift - No Drift for 5 Seconds + 0 6B: Test Right Leg Motor Drift - No Drift for 5 Seconds + 0 7: Test Limb Ataxia (FNF/Heel-Shin) - No Ataxia + 0 8: Test Sensation - Normal; No sensory loss + 0 9: Test Language/Aphasia - Normal; No aphasia + 0 10: Test Dysarthria - Mild-Moderate Dysarthria: Slurring but can be understood + 1 11: Test Extinction/Inattention - No abnormality + 0  NIHSS Score: 1  Pre-Morbid Modified Ranking Scale: 0 Points = No symptoms at all  Patient/Family was informed the Neurology Consult would happen via TeleHealth consult by way of interactive audio and video telecommunications and consented to receiving care in this manner.   Due to the immediate  potential for life-threatening deterioration due to underlying acute neurologic illness, I spent 15 minutes providing critical care. This time includes time for face to face visit via telemedicine, review of medical records, imaging studies and discussion of findings with providers, the patient and/or family.   Dr Hal Morales   [facility] [phone]  Case [id number]

## 2019-01-30 NOTE — ED Triage Notes (Signed)
CS called at 1331.  [facility] called 1333. Pt currently in CT Slurred speech that was noticed upon waking up today at 10 am. Pt went to bed last night at midnight

## 2019-01-30 NOTE — ED Notes (Signed)
ED TO INPATIENT HANDOFF REPORT  ED Nurse Name and Phone #: [phone]  S Name/Age/Gender Alex Lara. 79 y.o. male Room/Bed: APA02/APA02  Code Status   Code Status: Full Code  Home/SNF/Other Home Patient oriented to: situation Is this baseline? Yes      Chief Complaint Stroke-like symptoms [R29.90] Stroke [facility]) [I63.9]  Triage Note CS called at 1331.  Teleneuro called 1333. Pt currently in CT Slurred speech that was noticed upon waking up today at 10 am. Pt went to bed last night at midnight    Allergies Allergies  Allergen Reactions  . Codeine Nausea And Vomiting and Other (See Comments)    "go fuzzy"    Level of Care/Admitting Diagnosis ED Disposition    ED Disposition Condition [place]: [facility] [id number]  Level of Care: Telemetry [5]  Covid Evaluation: Asymptomatic Screening Protocol (No Symptoms)  Diagnosis: [id number]  Admitting Physician: Barton Dubois [[id number]]  Attending Physician: Barton Dubois [[id number]]  Estimated length of stay: past midnight tomorrow  Certification:: I certify this patient will need inpatient services for at least 2 midnights       B Medical/Surgery History Past Medical History:  Diagnosis Date  . Anxiety and depression   . Arteriosclerotic cardiovascular disease (ASCVD)    06/2008: DES to the RCA and LAD  . 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 Carpal tunnel syndrome   . Chronic anticoagulation   . Deep vein thrombosis ([place])   . Degenerative joint disease   . Gastroesophageal reflux disease   . Hyperlipidemia   . Hypertension   . Prostate cancer ([place])   . Tobacco abuse    Past Surgical History:  Procedure Laterality Date  . COLONOSCOPY  ?  Date  . INGUINAL HERNIA REPAIR    . ORIF-unknown fracture    . PROSTATE BIOPSY    . ROTATOR CUFF REPAIR    . TOTAL HIP ARTHROPLASTY  2004   Left     A IV Location/Drains/Wounds Patient Lines/Drains/Airways Status   Active  Line/Drains/Airways    Name:   Placement date:   Placement time:   Site:   Days:   Peripheral IV 01/30/19 Left Antecubital   01/30/19    1404    Antecubital   less than 1          Intake/Output Last 24 hours No intake or output data in the 24 hours ending 01/30/19 1932  Labs/Imaging Results for orders placed or performed during the hospital encounter of 01/30/19 (from the past 48 hour(s))  Respiratory Panel by RT PCR (Flu A&B, Covid) - Nasopharyngeal Swab     Status: None   Collection Time: 01/30/19  1:35 PM   Specimen: Nasopharyngeal Swab  Result Value Ref Range   SARS Coronavirus 2 by RT PCR NEGATIVE NEGATIVE    Comment: (NOTE) SARS-CoV-2 target nucleic acids are NOT DETECTED. The SARS-CoV-2 RNA is generally detectable in upper respiratoy specimens during the acute phase of infection. The lowest concentration of SARS-CoV-2 viral copies this assay can detect is 131 copies/mL. A negative result does not preclude SARS-Cov-2 infection and should not be used as the sole basis for treatment or other patient management decisions. A negative result may occur with  improper specimen collection/handling, submission of specimen other than nasopharyngeal swab, presence of viral mutation(s) within the areas targeted by this assay, and inadequate number of viral copies (<131 copies/mL). A negative result must be combined with clinical observations, patient history,  and epidemiological information. The expected result is Negative. Fact Sheet for Patients:  PinkCheek.be Fact Sheet for Healthcare Providers:  GravelBags.it This test is not yet ap proved or cleared by the [place] FDA and  has been authorized for detection and/or diagnosis of SARS-CoV-2 by FDA under an Emergency Use Authorization (EUA). This EUA will remain  in effect (meaning this test can be used) for the duration of the COVID-19 declaration under Section  564(b)(1) of the Act, 21 U.S.C. section 360bbb-3(b)(1), unless the authorization is terminated or revoked sooner.    Influenza A by PCR NEGATIVE NEGATIVE   Influenza B by PCR NEGATIVE NEGATIVE    Comment: (NOTE) The Xpert Xpress SARS-CoV-2/FLU/RSV assay is intended as an aid in  the diagnosis of influenza from Nasopharyngeal swab specimens and  should not be used as a sole basis for treatment. Nasal washings and  aspirates are unacceptable for Xpert Xpress SARS-CoV-2/FLU/RSV  testing. Fact Sheet for Patients: PinkCheek.be Fact Sheet for Healthcare Providers: GravelBags.it This test is not yet approved or cleared by the [place] FDA and  has been authorized for detection and/or diagnosis of SARS-CoV-2 by  FDA under an Emergency Use Authorization (EUA). This EUA will remain  in effect (meaning this test can be used) for the duration of the  Covid-19 declaration under Section 564(b)(1) of the Act, 21  U.S.C. section 360bbb-3(b)(1), unless the authorization is  terminated or revoked. Performed at [facility], [address], [place], [place] [address]   Ethanol     Status: None   Collection Time: 01/30/19  1:43 PM  Result Value Ref Range   Alcohol, Ethyl (B) <10 <10 mg/dL    Comment: (NOTE) Lowest detectable limit for serum alcohol is 10 mg/dL. For medical purposes only. Performed at [facility], [address] [place], [place] [address]   Protime-INR     Status: None   Collection Time: 01/30/19  1:43 PM  Result Value Ref Range   Prothrombin Time 12.9 11.4 - 15.2 seconds   INR 1.0 0.8 - 1.2    Comment: (NOTE) INR goal varies based on device and disease states. Performed at [facility], [address] [place], [place] [address]   APTT     Status: None   Collection Time: 01/30/19  1:43 PM  Result Value Ref Range   aPTT 29 24 - 36 seconds    Comment: Performed at [facility], [address] [place], [place]  [address]  CBC     Status: Abnormal   Collection Time: 01/30/19  1:43 PM  Result Value Ref Range   WBC 5.5 4.0 - 10.5 K/uL   RBC 4.02 (L) 4.22 - 5.81 MIL/uL   Hemoglobin 12.4 (L) 13.0 - 17.0 g/dL   HCT 37.3 (L) 39.0 - 52.0 %   MCV 92.8 80.0 - 100.0 fL   MCH 30.8 26.0 - 34.0 pg   MCHC 33.2 30.0 - 36.0 g/dL   RDW 15.0 11.5 - 15.5 %   Platelets 146 (L) 150 - 400 K/uL   nRBC 0.0 0.0 - 0.2 %    Comment: Performed at [facility], [address] [place], [place] [address]  Differential     Status: Abnormal   Collection Time: 01/30/19  1:43 PM  Result Value Ref Range   Neutrophils Relative % 53 %   Neutro Abs 2.9 1.7 - 7.7 K/uL   Lymphocytes Relative 21 %   Lymphs Abs 1.2 0.7 - 4.0 K/uL   Monocytes Relative 13 %  Monocytes Absolute 0.7 0.1 - 1.0 K/uL   Eosinophils Relative 11 %   Eosinophils Absolute 0.6 (H) 0.0 - 0.5 K/uL   Basophils Relative 1 %   Basophils Absolute 0.1 0.0 - 0.1 K/uL   Immature Granulocytes 1 %   Abs Immature Granulocytes 0.05 0.00 - 0.07 K/uL    Comment: Performed at [facility], [address] [place], [place] [address]  Comprehensive metabolic panel     Status: Abnormal   Collection Time: 01/30/19  1:43 PM  Result Value Ref Range   Sodium 136 135 - 145 mmol/L   Potassium 3.4 (L) 3.5 - 5.1 mmol/L   Chloride 101 98 - 111 mmol/L   CO2 26 22 - 32 mmol/L   Glucose, Bld 104 (H) 70 - 99 mg/dL   BUN 17 8 - 23 mg/dL   Creatinine, Ser 1.00 0.61 - 1.24 mg/dL   Calcium 8.9 8.9 - 10.3 mg/dL   Total Protein 6.8 6.5 - 8.1 g/dL   Albumin 3.8 3.5 - 5.0 g/dL   AST 16 15 - 41 U/L   ALT 15 0 - 44 U/L   Alkaline Phosphatase 88 38 - 126 U/L   Total Bilirubin 0.6 0.3 - 1.2 mg/dL   GFR calc non Af Amer >60 >60 mL/min   GFR calc Af Amer >60 >60 mL/min   Anion gap 9 5 - 15    Comment: Performed at [facility], [address], [place], [place] [address]  TSH     Status: None   Collection Time: 01/30/19  1:43 PM  Result Value Ref Range   TSH 1.836 0.350 - 4.500 uIU/mL     Comment: Performed by a 3rd Generation assay with a functional sensitivity of <=0.01 uIU/mL. Performed at [facility], [address] [place], [place] [address]   Vitamin B12     Status: None   Collection Time: 01/30/19  1:43 PM  Result Value Ref Range   Vitamin B-12 203 180 - 914 pg/mL    Comment: (NOTE) This assay is not validated for testing neonatal or myeloproliferative syndrome specimens for Vitamin B12 levels. Performed at [facility], [address] [place], [place] [address]   Ginger Carne 8, ED     Status: Abnormal   Collection Time: 01/30/19  1:50 PM  Result Value Ref Range   Sodium 139 135 - 145 mmol/L   Potassium 3.5 3.5 - 5.1 mmol/L   Chloride 101 98 - 111 mmol/L   BUN 16 8 - 23 mg/dL   Creatinine, Ser 1.10 0.61 - 1.24 mg/dL   Glucose, Bld 100 (H) 70 - 99 mg/dL   Calcium, Ion 1.19 1.15 - 1.40 mmol/L   TCO2 25 22 - 32 mmol/L   Hemoglobin 12.2 (L) 13.0 - 17.0 g/dL   HCT 36.0 (L) 39.0 - 52.0 %  Urine rapid drug screen (hosp performed)     Status: None   Collection Time: 01/30/19  4:45 PM  Result Value Ref Range   Opiates NONE DETECTED NONE DETECTED   Cocaine NONE DETECTED NONE DETECTED   Benzodiazepines NONE DETECTED NONE DETECTED   Amphetamines NONE DETECTED NONE DETECTED   Tetrahydrocannabinol NONE DETECTED NONE DETECTED   Barbiturates NONE DETECTED NONE DETECTED    Comment: (NOTE) DRUG SCREEN FOR MEDICAL PURPOSES ONLY.  IF CONFIRMATION IS NEEDED FOR ANY PURPOSE, NOTIFY LAB WITHIN 5 DAYS. LOWEST DETECTABLE LIMITS FOR URINE DRUG SCREEN Drug Class  Cutoff (ng/mL) Amphetamine and metabolites    1000 Barbiturate and metabolites    200 Benzodiazepine                 [id number] Tricyclics and metabolites     300 Opiates and metabolites        300 Cocaine and metabolites        300 THC                            50 Performed at [facility], [address] [place], [place] [address]   Urinalysis, Routine w reflex microscopic     Status: None    Collection Time: 01/30/19  4:45 PM  Result Value Ref Range   Color, Urine YELLOW YELLOW   APPearance CLEAR CLEAR   Specific Gravity, Urine 1.010 1.005 - 1.030   pH 6.0 5.0 - 8.0   Glucose, UA NEGATIVE NEGATIVE mg/dL   Hgb urine dipstick NEGATIVE NEGATIVE   Bilirubin Urine NEGATIVE NEGATIVE   Ketones, ur NEGATIVE NEGATIVE mg/dL   Protein, ur NEGATIVE NEGATIVE mg/dL   Nitrite NEGATIVE NEGATIVE   Leukocytes,Ua NEGATIVE NEGATIVE    Comment: Performed at [facility], [address], [place], [place] [address]   DG Chest 1 View  Result Date: 01/30/2019 CLINICAL DATA:  Slurred speech EXAM: CHEST  1 VIEW COMPARISON:  12/07/2015 FINDINGS: Cardiomegaly. Both lungs are clear. The visualized skeletal structures are unremarkable. IMPRESSION: Cardiomegaly without acute abnormality of the lungs in AP portable projection. Electronically Signed   By: Eddie Candle M.D.   On: 01/30/2019 15:49   MR ANGIO HEAD WO CONTRAST  Result Date: 01/30/2019 CLINICAL DATA:  Stroke suspected. Slurred speech that was noticed upon waking today at 10 a.m. History of prostate cancer. EXAM: MRI HEAD WITHOUT CONTRAST MRA HEAD WITHOUT CONTRAST TECHNIQUE: Multiplanar, multiecho pulse sequences of the brain and surrounding structures were obtained without intravenous contrast. Angiographic images of the head were obtained using MRA technique without contrast. COMPARISON:  Noncontrast head CT 01/30/2019, brain MRI 06/26/2017, FINDINGS: MRI HEAD FINDINGS Brain: Intermittent motion degradation. This includes mild motion degradation of the diffusion-weighted imaging. There are several small acute cortical infarcts within the posterolateral left frontal lobe. Additionally, there is a small cortically based infarct in the left occipital lobe which demonstrates diffusion weighted hyperintensity but no corresponding ADC hypointensity, likely late subacute. 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 Chronic lacunar infarcts  within the left corona radiata, left thalamus and right basal ganglia were not present on prior MRI. Redemonstrated chronic lacunar infarcts within the right thalamus and within the bilateral cerebellar hemispheres. No evidence of intracranial mass. No midline shift or extra-axial fluid collection. No chronic intracranial blood products. Vascular: Reported separately Skull and upper cervical spine: No focal marrow lesion Sinuses/Orbits: Visualized orbits demonstrate no acute abnormality. Mild ethmoid sinus mucosal thickening. Trace fluid within left mastoid air cells MRA HEAD FINDINGS The intracranial internal carotids are patent without significant stenosis. 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 There is a 1-2 mm inferiorly projecting vascular protrusion arising from the supraclinoid left ICA which may reflect an infundibulum or tiny aneurysm (series 2, image 69). The right middle cerebral artery is patent. Apparent high-grade focal stenosis within the mid to distal M1 right middle cerebral artery (series 101, image 9). There is also high-grade focal stenosis within a proximal right M2 branch vessel. The  right anterior cerebral artery is patent without high-grade proximal stenosis. The M1 left middle cerebral artery is patent without significant stenosis. Atherosclerotic irregularity with attenuated appearance of a proximal to mid left M2 branch vessel. The left anterior cerebral artery is patent without high-grade proximal stenosis. The non dominant intracranial right vertebral artery is developmentally diminutive and appears to terminate predominantly as the right PICA. The dominant intracranial left vertebral artery is patent without significant stenosis, as is the basilar artery. Predominantly fetal origin of the right posterior cerebral artery which is patent without significant proximal stenosis. The left posterior  cerebral artery is patent proximally. High-grade focal stenosis within distal P2 left PCA branch. The more distal left posterior cerebral artery demonstrates significant atherosclerotic irregularity. Hypoplastic or absent left posterior communicating artery. IMPRESSION: MRI brain: 1. Motion degraded exam. 2. Several small acute cortical infarcts within the posterolateral left frontal lobe. 3. Small late subacute left occipital lobe cortically based infarct. 4. Background chronic small vessel ischemic disease has progressed since MRI 06/26/2017. Chronic lacunar infarcts within the left corona radiata, left thalamus and right basal ganglia were not present on this prior exam. Redemonstrated chronic lacunar infarcts within the right thalamus and bilateral cerebellar hemispheres. 5. Moderate generalized parenchymal atrophy MRA head: 1. Intracranial atherosclerotic disease with multifocal stenoses, most notably as follows. 2. High-grade focal stenosis within the mid to distal M1 right middle cerebral artery. There is also high-grade focal stenosis within a proximal right M2 branch vessel. 3. Significant atherosclerotic irregularity with attenuated appearance of a proximal to mid left M2 branch vessel. 4. High-grade focal stenosis within a distal P2 left PCA branch. Significant atherosclerotic irregularity of the left PCA more distally. 5. 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 6. 1-2 mm infundibulum versus tiny aneurysm arising from the supraclinoid left ICA. Electronically Signed   By: Kellie Simmering DO   On: 01/30/2019 17:32   MR BRAIN WO CONTRAST  Result Date: 01/30/2019 CLINICAL DATA:  Stroke suspected. Slurred speech that was noticed upon waking today at 10 a.m. History of prostate cancer. EXAM: MRI HEAD WITHOUT CONTRAST MRA HEAD WITHOUT CONTRAST TECHNIQUE: Multiplanar, multiecho pulse sequences of the brain and surrounding structures were obtained without intravenous contrast.  Angiographic images of the head were obtained using MRA technique without contrast. COMPARISON:  Noncontrast head CT 01/30/2019, brain MRI 06/26/2017, FINDINGS: MRI HEAD FINDINGS Brain: Intermittent motion degradation. This includes mild motion degradation of the diffusion-weighted imaging. There are several small acute cortical infarcts within the posterolateral left frontal lobe. Additionally, there is a small cortically based infarct in the left occipital lobe which demonstrates diffusion weighted hyperintensity but no corresponding ADC hypointensity, likely late subacute. 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 Chronic lacunar infarcts within the left corona radiata, left thalamus and right basal ganglia were not present on prior MRI. Redemonstrated chronic lacunar infarcts within the right thalamus and within the bilateral cerebellar hemispheres. No evidence of intracranial mass. No midline shift or extra-axial fluid collection. No chronic intracranial blood products. Vascular: Reported separately Skull and upper cervical spine: No focal marrow lesion Sinuses/Orbits: Visualized orbits demonstrate no acute abnormality. Mild ethmoid sinus mucosal thickening. Trace fluid within left mastoid air cells MRA HEAD FINDINGS The intracranial internal carotids are patent without significant stenosis. 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 There is a 1-2 mm inferiorly projecting vascular protrusion arising from the supraclinoid left ICA which may reflect  an infundibulum or tiny aneurysm (series 2, image 69). The right middle cerebral artery is patent. Apparent high-grade focal stenosis within the mid to distal M1 right middle cerebral artery (series 101, image 9). There is also high-grade focal stenosis within a proximal right M2 branch vessel. The right anterior cerebral artery is patent without  high-grade proximal stenosis. The M1 left middle cerebral artery is patent without significant stenosis. Atherosclerotic irregularity with attenuated appearance of a proximal to mid left M2 branch vessel. The left anterior cerebral artery is patent without high-grade proximal stenosis. The non dominant intracranial right vertebral artery is developmentally diminutive and appears to terminate predominantly as the right PICA. The dominant intracranial left vertebral artery is patent without significant stenosis, as is the basilar artery. Predominantly fetal origin of the right posterior cerebral artery which is patent without significant proximal stenosis. The left posterior cerebral artery is patent proximally. High-grade focal stenosis within distal P2 left PCA branch. The more distal left posterior cerebral artery demonstrates significant atherosclerotic irregularity. Hypoplastic or absent left posterior communicating artery. IMPRESSION: MRI brain: 1. Motion degraded exam. 2. Several small acute cortical infarcts within the posterolateral left frontal lobe. 3. Small late subacute left occipital lobe cortically based infarct. 4. Background chronic small vessel ischemic disease has progressed since MRI 06/26/2017. Chronic lacunar infarcts within the left corona radiata, left thalamus and right basal ganglia were not present on this prior exam. Redemonstrated chronic lacunar infarcts within the right thalamus and bilateral cerebellar hemispheres. 5. Moderate generalized parenchymal atrophy MRA head: 1. Intracranial atherosclerotic disease with multifocal stenoses, most notably as follows. 2. High-grade focal stenosis within the mid to distal M1 right middle cerebral artery. There is also high-grade focal stenosis within a proximal right M2 branch vessel. 3. Significant atherosclerotic irregularity with attenuated appearance of a proximal to mid left M2 branch vessel. 4. High-grade focal stenosis within a distal P2  left PCA branch. Significant atherosclerotic irregularity of the left PCA more distally. 5. 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 6. 1-2 mm infundibulum versus tiny aneurysm arising from the supraclinoid left ICA. Electronically Signed   By: Kellie Simmering DO   On: 01/30/2019 17:32   US Carotid Bilateral (at [facility] and AP only)  Result Date: 01/30/2019 CLINICAL DATA:  79 year old male with a history of stroke EXAM: BILATERAL CAROTID DUPLEX ULTRASOUND TECHNIQUE: Pearline Cables scale imaging, color Doppler and duplex ultrasound were performed of bilateral carotid and vertebral arteries in the neck. COMPARISON:  None. FINDINGS: Criteria: Quantification of carotid stenosis is based on velocity parameters that correlate the residual internal carotid diameter with NASCET-based stenosis levels, using the diameter of the distal internal carotid lumen as the denominator for stenosis measurement. The following velocity measurements were obtained: RIGHT ICA:  Systolic 0000000 cm/sec, Diastolic 56 cm/sec CCA:  73 cm/sec SYSTOLIC ICA/CCA RATIO:  2.6 ECA:  146 cm/sec LEFT ICA:  Systolic 123XX123 cm/sec, Diastolic 24 cm/sec CCA:  93 cm/sec SYSTOLIC ICA/CCA RATIO:  1.0 ECA:  150 cm/sec Right Brachial SBP: Not acquired Left Brachial SBP: Not acquired RIGHT CAROTID ARTERY: No significant calcifications of the right common carotid artery. Intermediate waveform maintained. Moderate heterogeneous and partially calcified plaque at the right carotid bifurcation. No significant lumen shadowing. Low resistance waveform of the right ICA. No significant tortuosity. RIGHT VERTEBRAL ARTERY: Late systolic reversal of the vertebral artery waveform with antegrade flow throughout. LEFT CAROTID ARTERY: No significant calcifications of the left common carotid artery. Intermediate waveform maintained. Moderate heterogeneous and partially calcified  plaque at the left carotid bifurcation. No significant lumen shadowing. Low resistance  waveform of the left ICA. No significant tortuosity. LEFT VERTEBRAL ARTERY:  Antegrade flow with low resistance waveform. IMPRESSION: Right: Heterogeneous and partially calcified plaque at the right carotid bifurcation contributes to 50%-69% stenosis by established duplex criteria. Left: Color duplex indicates moderate heterogeneous and calcified plaque, with no hemodynamically significant stenosis by duplex criteria in the extracranial cerebrovascular circulation. Waveform of the right vertebral artery suggests a developing right subclavian artery stenosis, without reversal. Correlation with bilateral upper extremity systolic blood pressure may be useful. Signed, Dulcy Fanny. Dellia Nims, RPVI [facility] Electronically Signed   By: Corrie Mckusick D.O.   On: 01/30/2019 16:36   CT HEAD CODE STROKE WO CONTRAST  Result Date: 01/30/2019 CLINICAL DATA:  Code stroke.  Ataxia.  Slurred speech. EXAM: CT HEAD WITHOUT CONTRAST TECHNIQUE: Contiguous axial images were obtained from the base of the skull through the vertex without intravenous contrast. COMPARISON:  CT head 11/05/2013 FINDINGS: Brain: Mild atrophy. Ventricle size normal. Chronic infarct left Corona radiata, not present previously. Mild chronic ischemic change right basal ganglia. Asymmetric calcification right basal ganglia unchanged. Chronic infarct left occipital lobe, new since the prior CT. Negative for acute infarct.  Negative for hemorrhage or mass. Vascular: Negative for hyperdense vessel. Atherosclerotic disease in the carotid and vertebral arteries bilaterally. Skull: Negative Sinuses/Orbits: Negative Other: None ASPECTS ([place] Stroke Program Early CT Score) - Ganglionic level infarction (caudate, lentiform nuclei, internal capsule, insula, M1-M3 cortex): 7 - Supraganglionic infarction (M4-M6 cortex): 3 Total score (0-10 with 10 being normal): 10 IMPRESSION: 1. No acute abnormality 2. ASPECTS is  10 3. Chronic ischemic change in the left corona radiata. Chronic infarct left occipital lobe. Mild chronic ischemic change right basal ganglia. Negative for hemorrhage. 4. These results were called by telephone at the time of interpretation on 01/30/2019 at 1:48 pm to provider JOSEPH ZAMMIT , who verbally acknowledged these results. Electronically Signed   By: Franchot Gallo M.D.   On: 01/30/2019 13:48    Pending Labs Unresulted Labs (From admission, onward)    Start     Ordered   02/06/19 0500  Creatinine, serum  (enoxaparin (LOVENOX)    CrCl >/= 30 ml/min)  Weekly,   R    Comments: while on enoxaparin therapy    01/30/19 1452   01/31/19 0500  Hemoglobin A1c  Tomorrow morning,   R     01/30/19 1452   01/31/19 0500  Lipid panel  Tomorrow morning,   R    Comments: Fasting    01/30/19 1452          Vitals/Pain Today's Vitals   01/30/19 1400 01/30/19 1423 01/30/19 1436 01/30/19 1800  BP: (!) 144/98 131/77 (!) 150/81 (!) 158/70  Pulse: 63 65 65 64  Resp: 20 16 18 14   Temp: 97.8 F (36.6 C)     TempSrc: Oral     SpO2: 99% 100% 100% 100%  PainSc:   0-No pain 0-No pain    Isolation Precautions No active isolations  Medications Medications   stroke: mapping our early stages of recovery book (has no administration in time range)  0.9 %  sodium chloride infusion ( Intravenous New Bag/Given 01/30/19 1508)  acetaminophen (TYLENOL) tablet 650 mg (has no administration in time range)    Or  acetaminophen (TYLENOL) 160 MG/5ML solution 650 mg (has no administration in time range)    Or  acetaminophen (TYLENOL) suppository 650  mg (has no administration in time range)  senna-docusate (Senokot-S) tablet 1 tablet (has no administration in time range)  enoxaparin (LOVENOX) injection 40 mg (40 mg Subcutaneous Given 01/30/19 1722)  aspirin suppository 300 mg ( Rectal See Alternative 01/30/19 1721)    Or  aspirin tablet 325 mg (325 mg Oral Given 01/30/19 1721)  nicotine (NICODERM CQ - dosed in  mg/24 hours) patch 21 mg (21 mg Transdermal Patch Applied 01/30/19 1827)    Mobility walks Low fall risk   Focused Assessments Neuro Assessment Handoff:  Swallow screen pass? Yes  Cardiac Rhythm: Normal sinus rhythm NIH Stroke Scale ( + Modified Stroke Scale Criteria)  Interval: Initial Level of Consciousness (1a.)   : Alert, keenly responsive LOC Questions (1b. )   +: Answers both questions correctly LOC Commands (1c. )   + : Performs both tasks correctly Best Gaze (2. )  +: Normal Visual (3. )  +: No visual loss Facial Palsy (4. )    : Normal symmetrical movements Motor Arm, Left (5a. )   +: No drift Motor Arm, Right (5b. )   +: No drift Motor Leg, Left (6a. )   +: No drift Motor Leg, Right (6b. )   +: No drift Limb Ataxia (7. ): Absent Sensory (8. )   +: Normal, no sensory loss Best Language (9. )   +: Mild-to-moderate aphasia Dysarthria (10. ): Mild-to-moderate dysarthria, patient slurs at least some words and, at worst, can be understood with some difficulty Extinction/Inattention (11.)   +: No Abnormality Modified SS Total  +: 1 Complete NIHSS TOTAL: 2 Last date known well: 01/30/19 Last time known well: 1200 Neuro Assessment:   Neuro Checks:   Initial (01/30/19 1340)  Last Documented NIHSS Modified Score: 1 (01/30/19 1450) Has TPA been given? No If patient is a Neuro Trauma and patient is going to OR before floor call report to [facility] nurse: [phone] or [phone]     R Recommendations: See Admitting Provider Note  Report given to:   Additional Notes:

## 2019-01-31 ENCOUNTER — Inpatient Hospital Stay (HOSPITAL_COMMUNITY): Payer: Medicare Other

## 2019-01-31 DIAGNOSIS — F418 Other specified anxiety disorders: Secondary | ICD-10-CM

## 2019-01-31 DIAGNOSIS — N400 Enlarged prostate without lower urinary tract symptoms: Secondary | ICD-10-CM

## 2019-01-31 DIAGNOSIS — I351 Nonrheumatic aortic (valve) insufficiency: Secondary | ICD-10-CM

## 2019-01-31 DIAGNOSIS — N138 Other obstructive and reflux uropathy: Secondary | ICD-10-CM

## 2019-01-31 DIAGNOSIS — E7800 Pure hypercholesterolemia, unspecified: Secondary | ICD-10-CM

## 2019-01-31 LAB — ECHOCARDIOGRAM COMPLETE
Height: 72 in
Weight: 3086.44 oz

## 2019-01-31 LAB — LIPID PANEL
Cholesterol: 203 mg/dL — ABNORMAL HIGH (ref 0–200)
HDL: 25 mg/dL — ABNORMAL LOW (ref >40)
LDL Cholesterol: 139 mg/dL — ABNORMAL HIGH (ref 0–99)
Total CHOL/HDL Ratio: 8.1 RATIO
Triglycerides: 196 mg/dL — ABNORMAL HIGH (ref <150)
VLDL: 39 mg/dL (ref 0–40)

## 2019-01-31 LAB — HEMOGLOBIN A1C
Hgb A1c MFr Bld: 5.5 % (ref 4.8–5.6)
Mean Plasma Glucose: 111.15 mg/dL

## 2019-01-31 MED ORDER — PANTOPRAZOLE SODIUM 40 MG PO TBEC: 40.0000 mg | DELAYED_RELEASE_TABLET | Freq: Every day | ORAL | 3 refills | Status: DC

## 2019-01-31 MED ORDER — NICOTINE 21 MG/24HR TD PT24: 21.0000 mg | MEDICATED_PATCH | Freq: Every day | TRANSDERMAL | 0 refills | Status: DC

## 2019-01-31 MED ORDER — ASPIRIN 325 MG PO TABS: 325.0000 mg | ORAL_TABLET | Freq: Every day | ORAL | 3 refills | Status: DC

## 2019-01-31 MED ORDER — ATORVASTATIN CALCIUM 40 MG PO TABS: 40.0000 mg | ORAL_TABLET | Freq: Every day | ORAL | 3 refills | Status: AC

## 2019-01-31 MED ORDER — ATORVASTATIN CALCIUM 40 MG PO TABS
40.0000 mg | ORAL_TABLET | Freq: Every day | ORAL | Status: DC
  Administered 2019-01-31: 40 mg via ORAL
  Filled 2019-01-31: qty 1

## 2019-01-31 MED ORDER — CLOPIDOGREL BISULFATE 75 MG PO TABS: 75.0000 mg | ORAL_TABLET | Freq: Every day | ORAL | 3 refills | Status: DC

## 2019-01-31 NOTE — Evaluation (Signed)
Occupational Therapy Evaluation Patient Details Name: Alex Lara. MRN: [id number] DOB: 08/19/1940 Today's Date: 01/31/2019    History of Present Illness Ottavio Bitz. is a 79 y.o. male with past medical history significant for anxiety/depression, hypertension, BPH/history of prostate cancer, tobacco abuse, coronary artery disease, hyperlipidemia and gastroesophageal reflux disease; who presented to the emergency department secondary to slurred speech and ataxia. Per wife last time seen normal around midnight. She expressed husband has woken and shorter steps for the last couple of days and on the morning of admission she noticed difficulty finding words and slurred speech. There has not been any complaints of chest pain, shortness of breath, coughing, fever, chills, nausea, vomiting, focal muscular deficits, dysuria, hematuria, melena, hematochezia or any other complaints.   Clinical Impression   Pt agreeable to OT evaluation, increased time required for word finding at times during evaluation. Pt is independent in ADLs, supervision for managing IV pole. Pt is at baseline, no further OT services required at this time.     Follow Up Recommendations  No OT follow up    Equipment Recommendations  None recommended by OT       Precautions / Restrictions Precautions Precautions: Fall Restrictions Weight Bearing Restrictions: No      Mobility Bed Mobility Overal bed mobility: Modified Independent                Transfers Overall transfer level: Needs assistance Equipment used: None Transfers: Sit to/from Stand Sit to Stand: Supervision                  ADL either performed or assessed with clinical judgement   ADL Overall ADL's : Modified independent     Grooming: Wash/dry hands;Standing Grooming Details (indicate cue type and reason): no difficulty performing tasks standing at sink                             Functional mobility during  ADLs: Supervision/safety       Vision Baseline Vision/History: Wears glasses Vision Assessment?: No apparent visual deficits            Pertinent Vitals/Pain Pain Assessment: No/denies pain     Hand Dominance Right   Extremity/Trunk Assessment Upper Extremity Assessment Upper Extremity Assessment: Overall WFL for tasks assessed   Lower Extremity Assessment Lower Extremity Assessment: Defer to PT evaluation   Cervical / Trunk Assessment Cervical / Trunk Assessment: Normal   Communication Communication Communication: HOH;Expressive difficulties   Cognition Arousal/Alertness: Awake/alert Behavior During Therapy: WFL for tasks assessed/performed Overall Cognitive Status: Within Functional Limits for tasks assessed                                                Home Living Family/patient expects to be discharged to:: Private residence Living Arrangements: Spouse/significant other Available Help at Discharge: Family;Available PRN/intermittently Type of Home: House Home Access: Stairs to enter [employer] of Steps: 1 in front, 10 in back Entrance Stairs-Rails: Right;Left Home Layout: One level     Bathroom Shower/Tub: Tub/shower unit;Walk-in shower   Bathroom Toilet: Standard     Home Equipment: Cane - single point          Prior Functioning/Environment Level of Independence: Independent  End of Session    Activity Tolerance: Patient tolerated treatment well Patient left: in bed;with call bell/phone within reach;with bed alarm set                   Time: [id number] OT Time Calculation (min): 15 min Charges:  OT General Charges $OT Visit: 1 Visit OT Evaluation $OT Eval Low Complexity: [place], OTR/L  [phone] 01/31/2019, 8:24 AM

## 2019-01-31 NOTE — Progress Notes (Signed)
CODE STROKE CT NUMBERS 1323 CALL TIME 1324 BEEPER 1328 EXAM STARTED 1330 EXAM FINISHED Alex Lara

## 2019-01-31 NOTE — Progress Notes (Signed)
*  PRELIMINARY RESULTS* Echocardiogram 2D Echocardiogram has been performed.  Alex Lara 01/31/2019, 9:48 AM

## 2019-01-31 NOTE — Evaluation (Signed)
Speech Language Pathology Evaluation Patient Details Name: Alex Lara. MRN: [id number] DOB: 07/29/1940 Today's Date: 01/31/2019 Time: [id number] SLP Time Calculation (min) (ACUTE ONLY): 24 min  Problem List:  Patient Active Problem List   Diagnosis Date Noted  . Stroke-like symptoms 01/30/2019  . Stroke ([place]) 01/30/2019  . Malignant neoplasm of prostate ([place]) 04/22/2018  . Chest tightness 05/04/2016  . Dyspnea on exertion 05/04/2016  . Arteriosclerotic cardiovascular disease (ASCVD)   . Hyperlipidemia   . Tobacco abuse   . Deep vein thrombosis ([place])   . Gastroesophageal reflux disease   . Hypertension   . OSTEOARTHRITIS 02/14/2006   Past Medical History:  Past Medical History:  Diagnosis Date  . Anxiety and depression   . Arteriosclerotic cardiovascular disease (ASCVD)    06/2008: DES to the RCA and LAD  . 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 Carpal tunnel syndrome   . Chronic anticoagulation   . Deep vein thrombosis ([place])   . Degenerative joint disease   . Gastroesophageal reflux disease   . Hyperlipidemia   . Hypertension   . Prostate cancer ([place])   . Tobacco abuse    Past Surgical History:  Past Surgical History:  Procedure Laterality Date  . COLONOSCOPY  ?  Date  . INGUINAL HERNIA REPAIR    . ORIF-unknown fracture    . PROSTATE BIOPSY    . ROTATOR CUFF REPAIR    . TOTAL HIP ARTHROPLASTY  2004   Left   HPI:  Alex Lara. is a 79 y.o. male with past medical history significant for anxiety/depression, hypertension, BPH/history of prostate cancer, tobacco abuse, coronary artery disease, hyperlipidemia and gastroesophageal reflux disease; who presented to the emergency department secondary to slurred speech and ataxia. Per wife last time seen normal around midnight. She expressed husband has woken and shorter steps for the last couple of days and on the morning of admission she noticed difficulty finding words and slurred speech. There has not been any  complaints of chest pain, shortness of breath, coughing, fever, chills, nausea, vomiting, focal muscular deficits, dysuria, hematuria, melena, hematochezia or any other complaints. MRI shows: Several small acute cortical infarcts within the posterolateral left frontal lobe. Small late subacute left occipital lobe cortically based infarct. Background chronic small vessel ischemic disease has progressed since MRI 06/26/2017. Chronic lacunar infarcts within the left corona radiata, left thalamus and right basal ganglia were not present on this prior exam. Redemonstrated chronic lacunar infarcts within the right thalamus and bilateral cerebellar hemispheres. Moderate generalized parenchymal atrophy. SLE requested as part of stroke protocol.   Assessment / Plan / Recommendation Clinical Impression  SLE completed in room with wife present. Pt and wife report significant improvement in speech since yesterday. Pt reports that he knows what he wants to say, but has trouble pronouncing it. Verbal expression found to be WNL without naming deficits (confrontation, responsive, and divergent). Pt is extremely HOH which negatively impacts communication in setting of both parties wearing masks. Suspect Pt with mild dysarthria and possibly apraxia, however significant improvement since yesterday and barely noticeable during the visit. Pt recalled 2/4 words during delayed recall task and 4/4 with min cues. Pt's wife feels that Pt is close to baseline and currently declined OP SLP, however they know they can contact their PCP Willey Blade) should they change their minds and desire SLP therapy. Pt was encouraged to "slow down" when verbalizing and he acknowledges this helps. No further acute SLP needs at this time.  SLP Assessment  SLP Recommendation/Assessment: All further Speech Lanaguage Pathology  needs can be addressed in the next venue of care(offered OP SLP, however Pt and wife decline) SLP Visit Diagnosis: Cognitive  communication deficit (R41.841);Dysarthria and anarthria (R47.1)    Follow Up Recommendations  None;Outpatient SLP(Pt/wife will see how things continue to progress at home)    Frequency and Duration           SLP Evaluation Cognition  Arousal/Alertness: Awake/alert Orientation Level: Oriented X4 Memory: Appears intact(2/4 independently, 4/4 with min cue) Awareness: Appears intact Problem Solving: Appears intact Safety/Judgment: Appears intact       Comprehension  Auditory Comprehension Overall Auditory Comprehension: Appears within functional limits for tasks assessed(Pt very HOH) Yes/No Questions: Within Functional Limits Commands: Within Functional Limits Conversation: Complex Interfering Components: Hearing(mask) EffectiveTechniques: Increased volume;Stressing words Visual Recognition/Discrimination Discrimination: Within Function Limits Reading Comprehension Reading Status: Not tested    Expression Expression Primary Mode of Expression: Verbal Verbal Expression Overall Verbal Expression: Impaired Initiation: No impairment Automatic Speech: Name;Social Response;Month of year Level of Generative/Spontaneous Verbalization: Conversation Repetition: No impairment Naming: No impairment Pragmatics: No impairment Interfering Components: Speech intelligibility Non-Verbal Means of Communication: Not applicable Written Expression Dominant Hand: Right Written Expression: Not tested   Oral / Motor  Oral Motor/Sensory Function Overall Oral Motor/Sensory Function: Within functional limits Motor Speech Overall Motor Speech: Appears within functional limits for tasks assessed Respiration: Within functional limits Phonation: Normal Resonance: Within functional limits Articulation: Impaired Level of Impairment: Conversation Intelligibility: Intelligible Motor Planning: Witnin functional limits Motor Speech Errors: Aware Effective Techniques: Slow rate   Thank  you,  Genene Churn, [place]                     Chelbie Jarnagin 01/31/2019, 2:07 PM

## 2019-01-31 NOTE — Discharge Summary (Signed)
Physician Discharge Summary  Alex Lara. [id number] DOB: January 31, 1940 DOA: 01/30/2019  PCP: Asencion Noble, MD  Admit date: 01/30/2019 Discharge date: 01/31/2019  Time spent: 35 minutes  Recommendations for Outpatient Follow-up:  1. Repeat basic metabolic panel to follow electrolytes and renal function 2. Reassess blood pressure and further adjust antihypertensive regimen as needed 3. Continue assisting patient with tobacco cessation 4. Reassess continue improvement in his dysarthria and if needed arrange outpatient speech therapy.   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  Patient discharged home with instruction to follow-up with PCP in 10 days and with neurology service in 4 weeks.  Diet recommendation: Heart healthy diet.  Filed Weights   01/30/19 2000  Weight: 87.5 kg    History of present illness:   79 y.o. male with past medical history significant for anxiety/depression, hypertension, BPH/history of prostate cancer, tobacco abuse, coronary artery disease, hyperlipidemia and gastroesophageal reflux disease; who presented to the emergency department secondary to slurred speech and ataxia. Per wife last time seen normal around midnight. She expressed husband has woken and shorter steps for the last couple of days and on the morning of admission she noticed difficulty finding words and slurred speech. There has not been any complaints of chest pain, shortness of breath, coughing, fever, chills, nausea, vomiting, focal muscular deficits, dysuria, hematuria, melena, hematochezia or any other complaints.  In the ED a CT angiogram of the head was done demonstrating no acute  abnormalities. Teleneurology was consulted and patient was found out of therapeutic window for any TPA intervention. Recommendations given for patient to be admitted for TIA/stroke rule out. No improvement in the patient's symptoms were described while in the ED. [facility] was contacted to admit patient for further evaluation and management.   Hospital Course:  1-multiple small acute left posterolateral frontal infarcts and subacute left occipital cortical infarcts. -Abnormalities perfectly fitting patient's symptoms. -No abnormalities appreciated on telemetry, EKG or 2D echo. -Case discussed with neurology service who has recommended adjustment into his statins dose, initiation of dual antiplatelet therapy with full dose aspirin and Plavix and outpatient follow-up in 4 weeks. -Patient also advised to quit smoking -Physical therapy without recommendations, patient at baseline. -Speech therapy recommended if needed outpatient speech rehabilitation down the road.  Family will like to see how the patient progresses and pursuit as an outpatient through their PCP if required. -Continue risk factor modification.  2-essential hypertension -Allow for permissive hypertension -Safe to resume home Lotensin and metoprolol. -Advised to follow heart healthy diet.  3-hyperlipidemia -LDL 139; goal is for LDL less than 100. -Statins dose has been adjusted (Lipitor) to 40 mg by mouth daily -Patient advised to follow heart healthy diet.  4-history of coronary artery disease -No chest pain, no shortness of breath -Continue the use of statins, beta-blocker and aspirin -No ischemic changes appreciated on telemetry or EKG. -Echo demonstrating grade 1 diastolic dysfunction.  5-BPH/prostate cancer -Continue the use of Flomax -Patient denies urinary retention symptoms currently. -Continue outpatient follow-up with urology/oncology service.  6-gastroesophageal reflux disease -Continue Protonix  7-history  of depression/anxiety -Stable mood -No suicidal ideation or hallucination -Continue the use of as needed Klonopin, Pamelor and Zoloft.  8-history of tobacco abuse -Extensive cessation counseling has been provided. -Patient discharged on nicotine patch.  9-history of insomnia -  Continue melatonin nightly  Procedures:  See below for x-ray reports  2D echo: Ejection fraction 60 to [id number]; grade 1 diastolic dysfunction.  No emboli appreciated.  Carotid Dopplers: Vertebral arteries with antegrade flow; moderate plaque buildup appreciated on bilateral ICAs  Consultations:  Neurology service  Discharge Exam: Vitals:   01/31/19 1000 01/31/19 1400  BP: (!) 149/70 136/67  Pulse: (!) 58 (!) 59  Resp: 16 16  Temp: 98.1 F (36.7 C) 97.8 F (36.6 C)  SpO2: 100%     General: Afebrile, no chest pain, no nausea, no vomiting.  Some improvement appreciated on his speech/dysarthria.  No other focal deficits.  Patient would like to go home. Cardiovascular: S1 and S2, no rubs, no gallops, no murmurs, no JVD. Respiratory: Clear to auscultation bilaterally; normal respiratory effort.  No using accessory muscles. Abdomen: Soft, nontender, distended, positive bowel sounds Extremities: No edema, no cyanosis or clubbing. Neurologic exam: Oriented x3, hard of hearing otherwise no focal cranial nerve deficits observed.  Still with mild dysarthria with significant improvement in comparison to date of admission 01/30/2019.  No other neurologic deficits appreciated.  No pronation drift.  Discharge Instructions   Discharge Instructions    Diet - low sodium heart healthy   Complete by: As directed    Discharge instructions   Complete by: As directed    Continue adequate hydration Follow heart healthy diet Stop smoking Arrange follow-up with PCP in 10 days Follow-up with neurologist (Dr. Merlene Laughter) in 4 weeks.     Allergies as of 01/31/2019      Reactions   Codeine Nausea And Vomiting, Other (See  Comments)   "go fuzzy"      Medication List    STOP taking these medications   Advil 200 MG Caps Generic drug: Ibuprofen   amoxicillin-clavulanate 875-125 MG tablet Commonly known as: AUGMENTIN   aspirin EC 81 MG tablet Replaced by: aspirin 325 MG tablet     TAKE these medications   aspirin 325 MG tablet Take 1 tablet (325 mg total) by mouth daily. Start taking on: February 01, 2019 Replaces: aspirin EC 81 MG tablet   atorvastatin 40 MG tablet Commonly known as: LIPITOR Take 1 tablet (40 mg total) by mouth daily at 6 PM.   benazepril 40 MG tablet Commonly known as: LOTENSIN Take 1 tablet by mouth once daily What changed: when to take this   clonazePAM 0.5 MG tablet Commonly known as: KLONOPIN Take 0.5 mg by mouth 3 (three) times daily as needed for anxiety.   clopidogrel 75 MG tablet Commonly known as: Plavix Take 1 tablet (75 mg total) by mouth daily.   Melatonin 3 MG Tabs Take 3 mg by mouth at bedtime.   metoprolol tartrate 25 MG tablet Commonly known as: LOPRESSOR TAKE 1 TABLET BY MOUTH TWICE DAILY What changed: when to take this   nicotine 21 mg/24hr patch Commonly known as: NICODERM CQ - dosed in mg/24 hours Place 1 patch (21 mg total) onto the skin daily. Start taking on: February 01, 2019   nitroGLYCERIN 0.4 MG SL tablet Commonly known as: NITROSTAT Place 1 tablet (0.4 mg total) under the tongue every 5 (five) minutes as needed for chest pain.   nortriptyline 10 MG capsule Commonly known as: PAMELOR Take 30 mg by mouth at bedtime.   pantoprazole 40 MG tablet Commonly known as: PROTONIX Take 1 tablet (40 mg total) by mouth daily. Start taking on: February 01, 2019   potassium chloride SA 20 MEQ tablet Commonly known  as: KLOR-CON Take 20 mEq by mouth every evening.   sertraline 100 MG tablet Commonly known as: ZOLOFT Take 100 mg by mouth every evening.   tamsulosin 0.4 MG Caps capsule Commonly known as: FLOMAX Take 0.4 mg by mouth every  evening.      Allergies  Allergen Reactions  . Codeine Nausea And Vomiting and Other (See Comments)    "go fuzzy"   Follow-up Information    Asencion Noble, MD. Schedule an appointment as soon as possible for a visit in 10 day(s).   Specialty: Internal Medicine Contact information: [address] [place] [address] [phone]        Phillips Odor, MD. Schedule an appointment as soon as possible for a visit in 4 week(s).   Specialty: Neurology Contact information: [address] [place] [address] [phone]           The results of significant diagnostics from this hospitalization (including imaging, microbiology, ancillary and laboratory) are listed below for reference.    Significant Diagnostic Studies: DG Chest 1 View  Result Date: 01/30/2019 CLINICAL DATA:  Slurred speech EXAM: CHEST  1 VIEW COMPARISON:  12/07/2015 FINDINGS: Cardiomegaly. Both lungs are clear. The visualized skeletal structures are unremarkable. IMPRESSION: Cardiomegaly without acute abnormality of the lungs in AP portable projection. Electronically Signed   By: Eddie Candle M.D.   On: 01/30/2019 15:49   MR ANGIO HEAD WO CONTRAST  Result Date: 01/30/2019 CLINICAL DATA:  Stroke suspected. Slurred speech that was noticed upon waking today at 10 a.m. History of prostate cancer. EXAM: MRI HEAD WITHOUT CONTRAST MRA HEAD WITHOUT CONTRAST TECHNIQUE: Multiplanar, multiecho pulse sequences of the brain and surrounding structures were obtained without intravenous contrast. Angiographic images of the head were obtained using MRA technique without contrast. COMPARISON:  Noncontrast head CT 01/30/2019, brain MRI 06/26/2017, FINDINGS: MRI HEAD FINDINGS Brain: Intermittent motion degradation. This includes mild motion degradation of the diffusion-weighted imaging. There are several small acute cortical infarcts within the posterolateral left frontal lobe. Additionally, there is a small cortically  based infarct in the left occipital lobe which demonstrates diffusion weighted hyperintensity but no corresponding ADC hypointensity, likely late subacute. 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 Chronic lacunar infarcts within the left corona radiata, left thalamus and right basal ganglia were not present on prior MRI. Redemonstrated chronic lacunar infarcts within the right thalamus and within the bilateral cerebellar hemispheres. No evidence of intracranial mass. No midline shift or extra-axial fluid collection. No chronic intracranial blood products. Vascular: Reported separately Skull and upper cervical spine: No focal marrow lesion Sinuses/Orbits: Visualized orbits demonstrate no acute abnormality. Mild ethmoid sinus mucosal thickening. Trace fluid within left mastoid air cells MRA HEAD FINDINGS The intracranial internal carotids are patent without significant stenosis. 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 There is a 1-2 mm inferiorly projecting vascular protrusion arising from the supraclinoid left ICA which may reflect an infundibulum or tiny aneurysm (series 2, image 69). The right middle cerebral artery is patent. Apparent high-grade focal stenosis within the mid to distal M1 right middle cerebral artery (series 101, image 9). There is also high-grade focal stenosis within a proximal right M2 branch vessel. The right anterior cerebral artery is patent without high-grade proximal stenosis. The M1 left middle cerebral artery is patent without significant stenosis. Atherosclerotic irregularity with attenuated appearance of a proximal to mid left M2 branch vessel. The left anterior cerebral artery  is patent without high-grade proximal stenosis. The non dominant intracranial right vertebral artery is developmentally diminutive and appears to terminate predominantly as the right PICA. The  dominant intracranial left vertebral artery is patent without significant stenosis, as is the basilar artery. Predominantly fetal origin of the right posterior cerebral artery which is patent without significant proximal stenosis. The left posterior cerebral artery is patent proximally. High-grade focal stenosis within distal P2 left PCA branch. The more distal left posterior cerebral artery demonstrates significant atherosclerotic irregularity. Hypoplastic or absent left posterior communicating artery. IMPRESSION: MRI brain: 1. Motion degraded exam. 2. Several small acute cortical infarcts within the posterolateral left frontal lobe. 3. Small late subacute left occipital lobe cortically based infarct. 4. Background chronic small vessel ischemic disease has progressed since MRI 06/26/2017. Chronic lacunar infarcts within the left corona radiata, left thalamus and right basal ganglia were not present on this prior exam. Redemonstrated chronic lacunar infarcts within the right thalamus and bilateral cerebellar hemispheres. 5. Moderate generalized parenchymal atrophy MRA head: 1. Intracranial atherosclerotic disease with multifocal stenoses, most notably as follows. 2. High-grade focal stenosis within the mid to distal M1 right middle cerebral artery. There is also high-grade focal stenosis within a proximal right M2 branch vessel. 3. Significant atherosclerotic irregularity with attenuated appearance of a proximal to mid left M2 branch vessel. 4. High-grade focal stenosis within a distal P2 left PCA branch. Significant atherosclerotic irregularity of the left PCA more distally. 5. 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 6. 1-2 mm infundibulum versus tiny aneurysm arising from the supraclinoid left ICA. Electronically Signed   By: Kellie Simmering DO   On: 01/30/2019 17:32   MR BRAIN WO CONTRAST  Result Date: 01/30/2019 CLINICAL DATA:  Stroke suspected. Slurred speech that was noticed  upon waking today at 10 a.m. History of prostate cancer. EXAM: MRI HEAD WITHOUT CONTRAST MRA HEAD WITHOUT CONTRAST TECHNIQUE: Multiplanar, multiecho pulse sequences of the brain and surrounding structures were obtained without intravenous contrast. Angiographic images of the head were obtained using MRA technique without contrast. COMPARISON:  Noncontrast head CT 01/30/2019, brain MRI 06/26/2017, FINDINGS: MRI HEAD FINDINGS Brain: Intermittent motion degradation. This includes mild motion degradation of the diffusion-weighted imaging. There are several small acute cortical infarcts within the posterolateral left frontal lobe. Additionally, there is a small cortically based infarct in the left occipital lobe which demonstrates diffusion weighted hyperintensity but no corresponding ADC hypointensity, likely late subacute. 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 Chronic lacunar infarcts within the left corona radiata, left thalamus and right basal ganglia were not present on prior MRI. Redemonstrated chronic lacunar infarcts within the right thalamus and within the bilateral cerebellar hemispheres. No evidence of intracranial mass. No midline shift or extra-axial fluid collection. No chronic intracranial blood products. Vascular: Reported separately Skull and upper cervical spine: No focal marrow lesion Sinuses/Orbits: Visualized orbits demonstrate no acute abnormality. Mild ethmoid sinus mucosal thickening. Trace fluid within left mastoid air cells MRA HEAD FINDINGS The intracranial internal carotids are patent without significant stenosis. 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 There is a 1-2 mm inferiorly projecting vascular protrusion arising from the supraclinoid left ICA which may reflect an infundibulum or tiny aneurysm (series 2, image 69). The right middle cerebral artery is patent. Apparent  high-grade focal stenosis within the mid to distal M1 right middle cerebral artery (series 101, image 9). There is also high-grade focal  stenosis within a proximal right M2 branch vessel. The right anterior cerebral artery is patent without high-grade proximal stenosis. The M1 left middle cerebral artery is patent without significant stenosis. Atherosclerotic irregularity with attenuated appearance of a proximal to mid left M2 branch vessel. The left anterior cerebral artery is patent without high-grade proximal stenosis. The non dominant intracranial right vertebral artery is developmentally diminutive and appears to terminate predominantly as the right PICA. The dominant intracranial left vertebral artery is patent without significant stenosis, as is the basilar artery. Predominantly fetal origin of the right posterior cerebral artery which is patent without significant proximal stenosis. The left posterior cerebral artery is patent proximally. High-grade focal stenosis within distal P2 left PCA branch. The more distal left posterior cerebral artery demonstrates significant atherosclerotic irregularity. Hypoplastic or absent left posterior communicating artery. IMPRESSION: MRI brain: 1. Motion degraded exam. 2. Several small acute cortical infarcts within the posterolateral left frontal lobe. 3. Small late subacute left occipital lobe cortically based infarct. 4. Background chronic small vessel ischemic disease has progressed since MRI 06/26/2017. Chronic lacunar infarcts within the left corona radiata, left thalamus and right basal ganglia were not present on this prior exam. Redemonstrated chronic lacunar infarcts within the right thalamus and bilateral cerebellar hemispheres. 5. Moderate generalized parenchymal atrophy MRA head: 1. Intracranial atherosclerotic disease with multifocal stenoses, most notably as follows. 2. High-grade focal stenosis within the mid to distal M1 right middle cerebral artery. There  is also high-grade focal stenosis within a proximal right M2 branch vessel. 3. Significant atherosclerotic irregularity with attenuated appearance of a proximal to mid left M2 branch vessel. 4. High-grade focal stenosis within a distal P2 left PCA branch. Significant atherosclerotic irregularity of the left PCA more distally. 5. 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 6. 1-2 mm infundibulum versus tiny aneurysm arising from the supraclinoid left ICA. Electronically Signed   By: Kellie Simmering DO   On: 01/30/2019 17:32   US Carotid Bilateral (at [facility] and [facility] only)  Result Date: 01/30/2019 CLINICAL DATA:  79 year old male with a history of stroke EXAM: BILATERAL CAROTID DUPLEX ULTRASOUND TECHNIQUE: Pearline Cables scale imaging, color Doppler and duplex ultrasound were performed of bilateral carotid and vertebral arteries in the neck. COMPARISON:  None. FINDINGS: Criteria: Quantification of carotid stenosis is based on velocity parameters that correlate the residual internal carotid diameter with NASCET-based stenosis levels, using the diameter of the distal internal carotid lumen as the denominator for stenosis measurement. The following velocity measurements were obtained: RIGHT ICA:  Systolic 0000000 cm/sec, Diastolic 56 cm/sec CCA:  73 cm/sec SYSTOLIC ICA/CCA RATIO:  2.6 ECA:  146 cm/sec LEFT ICA:  Systolic 123XX123 cm/sec, Diastolic 24 cm/sec CCA:  93 cm/sec SYSTOLIC ICA/CCA RATIO:  1.0 ECA:  150 cm/sec Right Brachial SBP: Not acquired Left Brachial SBP: Not acquired RIGHT CAROTID ARTERY: No significant calcifications of the right common carotid artery. Intermediate waveform maintained. Moderate heterogeneous and partially calcified plaque at the right carotid bifurcation. No significant lumen shadowing. Low resistance waveform of the right ICA. No significant tortuosity. RIGHT VERTEBRAL ARTERY: Late systolic reversal of the vertebral artery waveform with antegrade flow throughout. LEFT CAROTID  ARTERY: No significant calcifications of the left common carotid artery. Intermediate waveform maintained. Moderate heterogeneous and partially calcified plaque at the left carotid bifurcation. No significant lumen shadowing. Low resistance waveform of the left ICA. No significant tortuosity. LEFT VERTEBRAL ARTERY:  Antegrade flow with low resistance waveform. IMPRESSION: Right: Heterogeneous and partially calcified plaque at the  right carotid bifurcation contributes to 50%-69% stenosis by established duplex criteria. Left: Color duplex indicates moderate heterogeneous and calcified plaque, with no hemodynamically significant stenosis by duplex criteria in the extracranial cerebrovascular circulation. Waveform of the right vertebral artery suggests a developing right subclavian artery stenosis, without reversal. Correlation with bilateral upper extremity systolic blood pressure may be useful. Signed, Dulcy Fanny. Dellia Nims, RPVI [facility] Electronically Signed   By: Corrie Mckusick D.O.   On: 01/30/2019 16:36   ECHOCARDIOGRAM COMPLETE  Result Date: 01/31/2019   ECHOCARDIOGRAM REPORT   Patient Name:   Alex Lara. Date of Exam: 01/31/2019 Medical Rec #:  [id number]          Height:       72.0 in Accession #:    [id number]         Weight:       192.9 lb Date of Birth:  [birth year]          BSA:          2.10 m Patient Age:    8 years           BP:           160/82 mmHg Patient Gender: M                  HR:           62 bpm. Exam Location:  [facility] Procedure: 2D Echo, Cardiac Doppler and Color Doppler Indications:    Stroke 434.91  History:        Patient has prior history of Echocardiogram examinations, most                 recent 05/09/2016. Risk Factors:Hypertension, Dyslipidemia and                 Current Smoker. Malignant neoplasm of prostate,Deep vein                 thrombosis,Dyspnea on exertion,GERD.  Sonographer:    Alvino Chapel RCS  Referring Phys: [place]  1. Left ventricular ejection fraction, by visual estimation, is 60 to 65%. The left ventricle has normal function. There is mildly increased left ventricular hypertrophy.  2. Left ventricular diastolic parameters are consistent with Grade I diastolic dysfunction (impaired relaxation).  3. The left ventricle has no regional wall motion abnormalities.  4. Global right ventricle has normal systolic function.The right ventricular size is normal. Mildly increased right ventricular wall thickness.  5. Left atrial size was normal.  6. Right atrial size was normal.  7. The mitral valve is grossly normal. Trivial mitral valve regurgitation.  8. The tricuspid valve is grossly normal.  9. The tricuspid valve is grossly normal. Tricuspid valve regurgitation is trivial. 10. The aortic valve is tricuspid. Aortic valve regurgitation is mild. Mild aortic valve sclerosis without stenosis. 11. The pulmonic valve was grossly normal. Pulmonic valve regurgitation is not visualized. 12. Aortic dilatation noted. 13. There is mild dilatation of the aortic root. 14. The inferior vena cava is normal in size with greater than 50% respiratory variability, suggesting right atrial pressure of 3 mmHg. FINDINGS  Left Ventricle: Left ventricular ejection fraction, by visual estimation, is 60 to 65%. The left ventricle has normal function. The left ventricle has no regional wall motion abnormalities. The left ventricular internal cavity size was the left ventricle is normal in size. There is mildly increased left ventricular hypertrophy. Concentric left ventricular  hypertrophy. Left ventricular diastolic parameters are consistent with Grade I diastolic dysfunction (impaired relaxation). Indeterminate filling pressures. Right Ventricle: The right ventricular size is normal. Mildly increased right ventricular wall thickness. Global RV systolic function is has normal systolic function. Left Atrium: Left  atrial size was normal in size. Right Atrium: Right atrial size was normal in size Pericardium: There is no evidence of pericardial effusion. Mitral Valve: The mitral valve is grossly normal. Trivial mitral valve regurgitation. Tricuspid Valve: The tricuspid valve is grossly normal. Tricuspid valve regurgitation is trivial. Aortic Valve: The aortic valve is tricuspid. . There is mild thickening of the aortic valve. Aortic valve regurgitation is mild. Aortic regurgitation PHT measures 689 msec. Mild aortic valve sclerosis is present, with no evidence of aortic valve stenosis. There is mild thickening of the aortic valve. Pulmonic Valve: The pulmonic valve was grossly normal. Pulmonic valve regurgitation is not visualized. Pulmonic regurgitation is not visualized. Aorta: Aortic dilatation noted. There is mild dilatation of the aortic root. Venous: The inferior vena cava is normal in size with greater than 50% respiratory variability, suggesting right atrial pressure of 3 mmHg. IAS/Shunts: No atrial level shunt detected by color flow Doppler.  LEFT VENTRICLE PLAX 2D LVIDd:         4.80 cm       Diastology LVIDs:         3.09 cm       LV e' lateral:   8.27 cm/s LV PW:         1.25 cm       LV E/e' lateral: 6.7 LV IVS:        1.26 cm       LV e' medial:    4.46 cm/s LVOT diam:     2.20 cm       LV E/e' medial:  12.4 LV SV:         70 ml LV SV Index:   32.99 LVOT Area:     3.80 cm  LV Volumes (MOD) LV area d, A2C:    26.40 cm LV area d, A4C:    30.40 cm LV area s, A2C:    16.10 cm LV area s, A4C:    16.50 cm LV major d, A2C:   7.29 cm LV major d, A4C:   8.01 cm LV major s, A2C:   6.10 cm LV major s, A4C:   6.68 cm LV vol d, MOD A2C: 82.7 ml LV vol d, MOD A4C: 95.7 ml LV vol s, MOD A2C: 36.7 ml LV vol s, MOD A4C: 36.5 ml LV SV MOD A2C:     46.0 ml LV SV MOD A4C:     95.7 ml LV SV MOD BP:      54.4 ml RIGHT VENTRICLE RV S prime:     16.80 cm/s TAPSE (M-mode): 1.9 cm LEFT ATRIUM             Index       RIGHT ATRIUM            Index LA diam:        2.40 cm 1.14 cm/m  RA Area:     17.30 cm LA Vol (A2C):   92.5 ml 44.09 ml/m RA Volume:   49.00 ml  23.35 ml/m LA Vol (A4C):   39.3 ml 18.73 ml/m LA Biplane Vol: 61.6 ml 29.36 ml/m  AORTIC VALVE LVOT Vmax:   135.00 cm/s LVOT Vmean:  75.000 cm/s LVOT VTI:    0.228 m  AI PHT:      689 msec  AORTA Ao Root diam: 4.20 cm MITRAL VALVE MV Area (PHT): 1.86 cm             SHUNTS MV PHT:        118.32 msec          Systemic VTI:  0.23 m MV Decel Time: 408 msec             Systemic Diam: 2.20 cm MV E velocity: 55.50 cm/s 103 cm/s MV A velocity: 78.50 cm/s 70.3 cm/s MV E/A ratio:  0.71       1.5  Kate Sable MD Electronically signed by Kate Sable MD Signature Date/Time: 01/31/2019/11:34:49 AM    Final    CT HEAD CODE STROKE WO CONTRAST  Result Date: 01/30/2019 CLINICAL DATA:  Code stroke.  Ataxia.  Slurred speech. EXAM: CT HEAD WITHOUT CONTRAST TECHNIQUE: Contiguous axial images were obtained from the base of the skull through the vertex without intravenous contrast. COMPARISON:  CT head 11/05/2013 FINDINGS: Brain: Mild atrophy. Ventricle size normal. Chronic infarct left Corona radiata, not present previously. Mild chronic ischemic change right basal ganglia. Asymmetric calcification right basal ganglia unchanged. Chronic infarct left occipital lobe, new since the prior CT. Negative for acute infarct.  Negative for hemorrhage or mass. Vascular: Negative for hyperdense vessel. Atherosclerotic disease in the carotid and vertebral arteries bilaterally. Skull: Negative Sinuses/Orbits: Negative Other: None ASPECTS ([place] Stroke Program Early CT Score) - Ganglionic level infarction (caudate, lentiform nuclei, internal capsule, insula, M1-M3 cortex): 7 - Supraganglionic infarction (M4-M6 cortex): 3 Total score (0-10 with 10 being normal): 10 IMPRESSION: 1. No acute abnormality 2. ASPECTS is 10 3. Chronic ischemic change in the left corona radiata. Chronic infarct left occipital lobe.  Mild chronic ischemic change right basal ganglia. Negative for hemorrhage. 4. These results were called by telephone at the time of interpretation on 01/30/2019 at 1:48 pm to provider JOSEPH ZAMMIT , who verbally acknowledged these results. Electronically Signed   By: Franchot Gallo M.D.   On: 01/30/2019 13:48    Microbiology: Recent Results (from the past 240 hour(s))  Respiratory Panel by RT PCR (Flu A&B, Covid) - Nasopharyngeal Swab     Status: None   Collection Time: 01/30/19  1:35 PM   Specimen: Nasopharyngeal Swab  Result Value Ref Range Status   SARS Coronavirus 2 by RT PCR NEGATIVE NEGATIVE Final    Comment: (NOTE) SARS-CoV-2 target nucleic acids are NOT DETECTED. The SARS-CoV-2 RNA is generally detectable in upper respiratoy specimens during the acute phase of infection. The lowest concentration of SARS-CoV-2 viral copies this assay can detect is 131 copies/mL. A negative result does not preclude SARS-Cov-2 infection and should not be used as the sole basis for treatment or other patient management decisions. A negative result may occur with  improper specimen collection/handling, submission of specimen other than nasopharyngeal swab, presence of viral mutation(s) within the areas targeted by this assay, and inadequate number of viral copies (<131 copies/mL). A negative result must be combined with clinical observations, patient history, and epidemiological information. The expected result is Negative. Fact Sheet for Patients:  PinkCheek.be Fact Sheet for Healthcare Providers:  GravelBags.it This test is not yet ap proved or cleared by the [place] FDA and  has been authorized for detection and/or diagnosis of SARS-CoV-2 by FDA under an Emergency Use Authorization (EUA). This EUA will remain  in effect (meaning this test can be used) for the duration of the COVID-19 declaration  under Section 564(b)(1) of the Act, 21  U.S.C. section 360bbb-3(b)(1), unless the authorization is terminated or revoked sooner.    Influenza A by PCR NEGATIVE NEGATIVE Final   Influenza B by PCR NEGATIVE NEGATIVE Final    Comment: (NOTE) The Xpert Xpress SARS-CoV-2/FLU/RSV assay is intended as an aid in  the diagnosis of influenza from Nasopharyngeal swab specimens and  should not be used as a sole basis for treatment. Nasal washings and  aspirates are unacceptable for Xpert Xpress SARS-CoV-2/FLU/RSV  testing. Fact Sheet for Patients: PinkCheek.be Fact Sheet for Healthcare Providers: GravelBags.it This test is not yet approved or cleared by the [place] FDA and  has been authorized for detection and/or diagnosis of SARS-CoV-2 by  FDA under an Emergency Use Authorization (EUA). This EUA will remain  in effect (meaning this test can be used) for the duration of the  Covid-19 declaration under Section 564(b)(1) of the Act, 21  U.S.C. section 360bbb-3(b)(1), unless the authorization is  terminated or revoked. Performed at [facility], [address], [place], [place] [address]      Labs: Basic Metabolic Panel: Recent Labs  Lab 01/30/19 1343 01/30/19 1350  NA 136 139  K 3.4* 3.5  CL 101 101  CO2 26  --   GLUCOSE 104* 100*  BUN 17 16  CREATININE 1.00 1.10  CALCIUM 8.9  --    Liver Function Tests: Recent Labs  Lab 01/30/19 1343  AST 16  ALT 15  ALKPHOS 88  BILITOT 0.6  PROT 6.8  ALBUMIN 3.8   CBC: Recent Labs  Lab 01/30/19 1343 01/30/19 1350  WBC 5.5  --   NEUTROABS 2.9  --   HGB 12.4* 12.2*  HCT 37.3* 36.0*  MCV 92.8  --   PLT 146*  --     Signed:  Barton Dubois MD.  [facility] 01/31/2019, 5:08 PM

## 2019-01-31 NOTE — Evaluation (Signed)
Physical Therapy Evaluation Patient Details Name: Alex Lara. MRN: [id number] DOB: 02-28-1940 Today's Date: 01/31/2019   History of Present Illness  Alex Mckee. is a 79 y.o. male with past medical history significant for anxiety/depression, hypertension, BPH/history of prostate cancer, tobacco abuse, coronary artery disease, hyperlipidemia and gastroesophageal reflux disease; who presented to the emergency department secondary to slurred speech and ataxia. Per wife last time seen normal around midnight. She expressed husband has woken and shorter steps for the last couple of days and on the morning of admission she noticed difficulty finding words and slurred speech. There has not been any complaints of chest pain, shortness of breath, coughing, fever, chills, nausea, vomiting, focal muscular deficits, dysuria, hematuria, melena, hematochezia or any other complaints.    Clinical Impression  Patient functioning at baseline for functional mobility and gait, demonstrates good return for ambulation on level, inclined, declined surfaces and going up/down steps using 1 side rail without loss of balance.  Plan:  Patient discharged from physical therapy to care of nursing for ambulation daily as tolerated for length of stay.     Follow Up Recommendations No PT follow up    Equipment Recommendations  None recommended by PT    Recommendations for Other Services       Precautions / Restrictions Precautions Precautions: None Restrictions Weight Bearing Restrictions: No      Mobility  Bed Mobility Overal bed mobility: Modified Independent             General bed mobility comments: slightly increased time  Transfers Overall transfer level: Modified independent Equipment used: None Transfers: Sit to/from [facility] Sit to Stand: Modified independent (Device/Increase time) Stand pivot transfers: Modified independent (Device/Increase time)       General  transfer comment: slightly increased time, no loss of balance  Ambulation/Gait Ambulation/Gait assistance: Modified independent (Device/Increase time) Gait Distance (Feet): 200 Feet Assistive device: None Gait Pattern/deviations: WFL(Within Functional Limits) Gait velocity: decreased   General Gait Details: demonstrates good return for ambulation on level, inclined and declined surfaces without loss of balance, required verbals to let arms swing with good carryover demonstrated  Stairs Stairs: Yes Stairs assistance: Modified independent (Device/Increase time) Stair Management: One rail Right;One rail Left;Alternating pattern Number of Stairs: 4 General stair comments: demonstrates good return for going up/down steps using 1 siderail without loss of balance  Wheelchair Mobility    Modified Rankin (Stroke Patients Only)       Balance Overall balance assessment: No apparent balance deficits (not formally assessed)                                           Pertinent Vitals/Pain Pain Assessment: No/denies pain    Home Living Family/patient expects to be discharged to:: Private residence Living Arrangements: Spouse/significant other Available Help at Discharge: Family;Available PRN/intermittently Type of Home: House Home Access: Stairs to enter Entrance Stairs-Rails: Psychiatric nurse of Steps: 1 in front, 10 in back Home Layout: One level Home Equipment: Cane - single point      Prior Function Level of Independence: Independent         Comments: Hydrographic surveyor, drives     Hand Dominance   Dominant Hand: Right    Extremity/Trunk Assessment   Upper Extremity Assessment Upper Extremity Assessment: Defer to OT evaluation    Lower Extremity Assessment Lower Extremity Assessment: Overall [facility]  for tasks assessed    Cervical / Trunk Assessment Cervical / Trunk Assessment: Normal  Communication   Communication:  HOH;Expressive difficulties  Cognition Arousal/Alertness: Awake/alert Behavior During Therapy: WFL for tasks assessed/performed Overall Cognitive Status: Within Functional Limits for tasks assessed                                        General Comments      Exercises     Assessment/Plan    PT Assessment Patent does not need any further PT services  PT Problem List         PT Treatment Interventions      PT Goals (Current goals can be found in the Care Plan section)  Acute Rehab PT Goals Patient Stated Goal: return home PT Goal Formulation: With patient Time For Goal Achievement: 01/31/19 Potential to Achieve Goals: Good    Frequency     Barriers to discharge        Co-evaluation               AM-PAC PT "6 Clicks" Mobility  Outcome Measure Help needed turning from your back to your side while in a flat bed without using bedrails?: None Help needed moving from lying on your back to sitting on the side of a flat bed without using bedrails?: None Help needed moving to and from a bed to a chair (including a wheelchair)?: None Help needed standing up from a chair using your arms (e.g., wheelchair or bedside chair)?: None Help needed to walk in hospital room?: None Help needed climbing 3-5 steps with a railing? : None 6 Click Score: 24    End of Session Equipment Utilized During Treatment: Gait belt Activity Tolerance: Patient tolerated treatment well Patient left: in chair;with call bell/phone within reach Nurse Communication: Mobility status PT Visit Diagnosis: Unsteadiness on feet (R26.81);Other abnormalities of gait and mobility (R26.89);Muscle weakness (generalized) (M62.81)    Time: [id number] PT Time Calculation (min) (ACUTE ONLY): 27 min   Charges:   PT Evaluation $PT Eval Moderate Complexity: 1 Mod PT Treatments $Therapeutic Activity: 23-37 mins        8:46 AM, 01/31/19 Lonell Grandchild, MPT Physical Therapist with  [facility] 336 [phone] office [phone] mobile phone

## 2019-02-06 ENCOUNTER — Ambulatory Visit: Payer: Medicare HMO | Admitting: Urology

## 2019-02-13 DIAGNOSIS — I69320 Aphasia following cerebral infarction: Secondary | ICD-10-CM | POA: Diagnosis not present

## 2019-02-13 DIAGNOSIS — E785 Hyperlipidemia, unspecified: Secondary | ICD-10-CM | POA: Diagnosis not present

## 2019-02-13 DIAGNOSIS — I10 Essential (primary) hypertension: Secondary | ICD-10-CM | POA: Diagnosis not present

## 2019-02-18 ENCOUNTER — Ambulatory Visit (HOSPITAL_COMMUNITY): Payer: Medicare Other | Attending: Internal Medicine | Admitting: Speech Pathology

## 2019-02-28 ENCOUNTER — Other Ambulatory Visit: Payer: Self-pay | Admitting: Cardiology

## 2019-03-06 DIAGNOSIS — I69322 Dysarthria following cerebral infarction: Secondary | ICD-10-CM | POA: Diagnosis not present

## 2019-03-07 ENCOUNTER — Other Ambulatory Visit: Payer: Self-pay | Admitting: Cardiology

## 2019-04-03 ENCOUNTER — Ambulatory Visit: Payer: Medicare Other | Admitting: Urology

## 2019-04-12 DIAGNOSIS — I10 Essential (primary) hypertension: Secondary | ICD-10-CM | POA: Diagnosis not present

## 2019-04-12 DIAGNOSIS — K219 Gastro-esophageal reflux disease without esophagitis: Secondary | ICD-10-CM | POA: Diagnosis not present

## 2019-04-12 DIAGNOSIS — I2510 Atherosclerotic heart disease of native coronary artery without angina pectoris: Secondary | ICD-10-CM | POA: Diagnosis not present

## 2019-04-12 DIAGNOSIS — Z79899 Other long term (current) drug therapy: Secondary | ICD-10-CM | POA: Diagnosis not present

## 2019-04-12 DIAGNOSIS — G464 Cerebellar stroke syndrome: Secondary | ICD-10-CM | POA: Diagnosis not present

## 2019-04-17 DIAGNOSIS — I639 Cerebral infarction, unspecified: Secondary | ICD-10-CM | POA: Diagnosis not present

## 2019-04-19 DIAGNOSIS — E785 Hyperlipidemia, unspecified: Secondary | ICD-10-CM | POA: Diagnosis not present

## 2019-04-19 DIAGNOSIS — Z8673 Personal history of transient ischemic attack (TIA), and cerebral infarction without residual deficits: Secondary | ICD-10-CM | POA: Diagnosis not present

## 2019-07-22 DIAGNOSIS — Z8673 Personal history of transient ischemic attack (TIA), and cerebral infarction without residual deficits: Secondary | ICD-10-CM | POA: Diagnosis not present

## 2019-07-22 DIAGNOSIS — R05 Cough: Secondary | ICD-10-CM | POA: Diagnosis not present

## 2019-08-15 ENCOUNTER — Ambulatory Visit (INDEPENDENT_AMBULATORY_CARE_PROVIDER_SITE_OTHER): Payer: Medicare Other | Admitting: Gastroenterology

## 2019-09-02 DIAGNOSIS — R3915 Urgency of urination: Secondary | ICD-10-CM | POA: Diagnosis not present

## 2019-09-02 DIAGNOSIS — R39198 Other difficulties with micturition: Secondary | ICD-10-CM | POA: Diagnosis not present

## 2019-09-02 DIAGNOSIS — Z923 Personal history of irradiation: Secondary | ICD-10-CM | POA: Diagnosis not present

## 2019-09-02 DIAGNOSIS — Z8673 Personal history of transient ischemic attack (TIA), and cerebral infarction without residual deficits: Secondary | ICD-10-CM | POA: Diagnosis not present

## 2019-09-02 DIAGNOSIS — R350 Frequency of micturition: Secondary | ICD-10-CM | POA: Diagnosis not present

## 2019-09-13 ENCOUNTER — Other Ambulatory Visit: Payer: Self-pay

## 2019-09-13 ENCOUNTER — Ambulatory Visit (INDEPENDENT_AMBULATORY_CARE_PROVIDER_SITE_OTHER): Payer: Medicare Other | Admitting: Urology

## 2019-09-13 VITALS — BP 147/83 | HR 69 | Temp 97.9°F

## 2019-09-13 DIAGNOSIS — N138 Other obstructive and reflux uropathy: Secondary | ICD-10-CM

## 2019-09-13 DIAGNOSIS — C61 Malignant neoplasm of prostate: Secondary | ICD-10-CM | POA: Diagnosis not present

## 2019-09-13 DIAGNOSIS — N5201 Erectile dysfunction due to arterial insufficiency: Secondary | ICD-10-CM

## 2019-09-13 DIAGNOSIS — N401 Enlarged prostate with lower urinary tract symptoms: Secondary | ICD-10-CM

## 2019-09-13 DIAGNOSIS — R3912 Poor urinary stream: Secondary | ICD-10-CM | POA: Diagnosis not present

## 2019-09-13 LAB — URINALYSIS, ROUTINE W REFLEX MICROSCOPIC
Bilirubin, UA: NEGATIVE
Glucose, UA: NEGATIVE
Ketones, UA: NEGATIVE
Leukocytes,UA: NEGATIVE
Nitrite, UA: NEGATIVE
Protein,UA: NEGATIVE
RBC, UA: NEGATIVE
Specific Gravity, UA: 1.02 (ref 1.005–1.030)
Urobilinogen, Ur: 0.2 mg/dL (ref 0.2–1.0)
pH, UA: 6 (ref 5.0–7.5)

## 2019-09-13 MED ORDER — TAMSULOSIN HCL 0.4 MG PO CAPS: 0.4000 mg | ORAL_CAPSULE | Freq: Every day | ORAL | 3 refills | Status: DC

## 2019-09-13 MED ORDER — TADALAFIL 20 MG PO TABS: 20.0000 mg | ORAL_TABLET | ORAL | 5 refills | Status: DC | PRN

## 2019-09-13 NOTE — Progress Notes (Signed)
09/13/2019 1:53 PM   Alex Lara. 07-06-1940 [id number]  Referring provider: Asencion Noble, MD [address] [place],  [place] [address]  Prostate  HPI: Alex Lara is a 79yo with a hx of prostate cancer. He was last seen over 1 year ago. He finished IMRT 1 year ago. He never returned for ADT. He only received 2 doses of firmagon. No recent PSA.  The has issues getting an erection which started when he received firmagon. He had a CVA in 01/2019. He was prescibed nitroglycerin but has not taken it in over 1 year.   PMH: Past Medical History:  Diagnosis Date  . Anxiety and depression   . Arteriosclerotic cardiovascular disease (ASCVD)    06/2008: DES to the RCA and LAD  . 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 Carpal tunnel syndrome   . Chronic anticoagulation   . Deep vein thrombosis ([place])   . Degenerative joint disease   . Gastroesophageal reflux disease   . Hyperlipidemia   . Hypertension   . Prostate cancer ([place])   . Tobacco abuse     Surgical History: Past Surgical History:  Procedure Laterality Date  . COLONOSCOPY  ?  Date  . INGUINAL HERNIA REPAIR    . ORIF-unknown fracture    . PROSTATE BIOPSY    . ROTATOR CUFF REPAIR    . TOTAL HIP ARTHROPLASTY  2004   Left    Home Medications:  Allergies as of 09/13/2019      Reactions   Codeine Nausea And Vomiting, Other (See Comments)   "go fuzzy"      Medication List       Accurate as of September 13, 2019  1:53 PM. If you have any questions, ask your nurse or doctor.        STOP taking these medications   aspirin 325 MG tablet Stopped by: Nicolette Bang, MD   benazepril 40 MG tablet Commonly known as: LOTENSIN Stopped by: Nicolette Bang, MD   clonazePAM 0.5 MG tablet Commonly known as: KLONOPIN Stopped by: Nicolette Bang, MD   clopidogrel 75 MG tablet Commonly known as: Plavix Stopped by: Nicolette Bang, MD   melatonin 3 MG Tabs tablet Stopped by: Nicolette Bang, MD   metoprolol  tartrate 25 MG tablet Commonly known as: LOPRESSOR Stopped by: Nicolette Bang, MD   nicotine 21 mg/24hr patch Commonly known as: NICODERM CQ - dosed in mg/24 hours Stopped by: Nicolette Bang, MD   nitroGLYCERIN 0.4 MG SL tablet Commonly known as: NITROSTAT Stopped by: Nicolette Bang, MD   pantoprazole 40 MG tablet Commonly known as: PROTONIX Stopped by: Nicolette Bang, MD   sertraline 100 MG tablet Commonly known as: ZOLOFT Stopped by: Nicolette Bang, MD   tamsulosin 0.4 MG Caps capsule Commonly known as: FLOMAX Stopped by: Nicolette Bang, MD     TAKE these medications   amoxicillin-clavulanate 875-125 MG tablet Commonly known as: AUGMENTIN Take 1 tablet by mouth daily.   atorvastatin 40 MG tablet Commonly known as: LIPITOR Take 1 tablet (40 mg total) by mouth daily at 6 PM.   nortriptyline 10 MG capsule Commonly known as: PAMELOR Take 30 mg by mouth at bedtime.   potassium chloride SA 20 MEQ tablet Commonly known as: KLOR-CON Take 20 mEq by mouth every evening.       Allergies:  Allergies  Allergen Reactions  . Codeine Nausea And Vomiting and Other (See Comments)    "go fuzzy"    Family History: Family History  Problem Relation Age of Onset  . Coronary artery disease Father   . Coronary artery disease Mother        died post-CABG  . Heart disease Mother   . Breast cancer Maternal Aunt   . Lung cancer Cousin   . Melanoma Daughter   . Prostate cancer Neg Hx     Social History:  reports that he has been smoking. He has a 82.50 pack-year smoking history. He has never used smokeless tobacco. He reports current alcohol use. He reports that he does not use drugs.  ROS: All other review of systems were reviewed and are negative except what is noted above in HPI  Physical Exam: BP (!) 147/83   Pulse 69   Temp 97.9 F (36.6 C)   Constitutional:  Alert and oriented, No acute distress. HEENT: [place] AT, moist mucus membranes.  Trachea midline,  no masses. Cardiovascular: No clubbing, cyanosis, or edema. Respiratory: Normal respiratory effort, no increased work of breathing. GI: Abdomen is soft, nontender, nondistended, no abdominal masses GU: No CVA tenderness.  Lymph: No cervical or inguinal lymphadenopathy. Skin: No rashes, bruises or suspicious lesions. Neurologic: Grossly intact, no focal deficits, moving all 4 extremities. Psychiatric: Normal mood and affect.  Laboratory Data: Lab Results  Component Value Date   WBC 5.5 01/30/2019   HGB 12.2 (L) 01/30/2019   HCT 36.0 (L) 01/30/2019   MCV 92.8 01/30/2019   PLT 146 (L) 01/30/2019    Lab Results  Component Value Date   CREATININE 1.10 01/30/2019    No results found for: PSA  No results found for: TESTOSTERONE  Lab Results  Component Value Date   HGBA1C 5.5 01/31/2019    Urinalysis    Component Value Date/Time   COLORURINE YELLOW 01/30/2019 1645   APPEARANCEUR CLEAR 01/30/2019 1645   LABSPEC 1.010 01/30/2019 1645   PHURINE 6.0 01/30/2019 [place] 01/30/2019 1645   [place] 01/30/2019 [place] 01/30/2019 [place] 01/30/2019 1645   PROTEINUR NEGATIVE 01/30/2019 1645   UROBILINOGEN 0.2 11/05/2013 1908   NITRITE NEGATIVE 01/30/2019 1645   LEUKOCYTESUR NEGATIVE 01/30/2019 1645    No results found for: LABMICR, [place], RBCUA, LABEPIT, MUCUS, BACTERIA  Pertinent Imaging:  No results found for this or any previous visit.  No results found for this or any previous visit.  No results found for this or any previous visit.  No results found for this or any previous visit.  Results for orders placed during the hospital encounter of 10/21/10  US Renal  Narrative *RADIOLOGY REPORT*  Clinical Data: Follow-up renal cyst.  Right flank pain  RENAL/URINARY TRACT ULTRASOUND COMPLETE  Comparison:  CT 07/31/2009  Findings:  Right Kidney:  12.6 cm in length.  Negative for obstruction.   Renal cortex is normal.  26 x 22 mm simple cyst on the right lower pole, similar to the prior CT.  Left Kidney:  11.0 cm in length.  Negative for obstruction.  Renal cortex is normal.  Negative for mass or cyst.  Bladder:  Negative  IMPRESSION: Right lower pole simple cysts.  Otherwise negative.  Original Report Authenticated By: Truett Perna, M.D.  No results found for this or any previous visit.  No results found for this or any previous visit.  No results found for this or any previous visit.   Assessment & Plan:    1. Malignant neoplasm of prostate ([place]) -PSa today -RTC 3 months with PSA - Urinalysis,  Routine w reflex microscopic  2. BPh with weak stream -restart flomax 0.4mg    3. Erectile dysfunction -We will trial prn  No follow-ups on file.  Nicolette Bang, MD  [facility] [place]

## 2019-09-13 NOTE — Patient Instructions (Signed)
Prostate Cancer  The prostate is a male gland that helps make semen. Prostate cancer is when abnormal cells grow in this gland. Follow these instructions at home:  Take over-the-counter and prescription medicines only as told by your doctor.  Eat a healthy diet.  Get plenty of sleep.  Ask your doctor for help to find a support group for men with prostate cancer.  Keep all follow-up visits as told by your doctor. This is important.  If you have to go to the hospital, let your cancer doctor (oncologist) know.  Touch, hold, hug, and caress your partner to continue to show sexual feelings. Contact a doctor if:  You have trouble peeing (urinating).  You have blood in your pee (urine).  You have pain in your hips, back, or chest. Get help right away if:  You have weakness in your legs.  You lose feeling (have numbness) in your legs.  You cannot control your pee or your poop (stool).  You have trouble breathing.  You have sudden pain in your chest.  You have chills or a fever. Summary  The prostate is a male gland that helps make semen. Prostate cancer is when abnormal cells grow in this gland.  Ask your doctor for help to find a support group for men with prostate cancer.  Contact a doctor if you have problems peeing or have any new pain that you did not have before. This information is not intended to replace advice given to you by your health care provider. Make sure you discuss any questions you have with your health care provider. Document Revised: 12/09/2016 Document Reviewed: 09/07/2015 Elsevier Patient Education  2020 [employer]vier Inc.  

## 2019-09-13 NOTE — Progress Notes (Signed)
Urological Symptom Review  Patient is experiencing the following symptoms: Get up at night to urinate Weak stream Erection problems (male only)   Review of Systems  Gastrointestinal (upper)  : Negative for upper GI symptoms  Gastrointestinal (lower) : Constipation  Constitutional : Fatigue  Skin: Negative for skin symptoms  Eyes: Negative for eye symptoms  Ear/Nose/Throat : Negative for Ear/Nose/Throat symptoms  Hematologic/Lymphatic: Negative for Hematologic/Lymphatic symptoms  Cardiovascular : negative  Respiratory : Cough Shortness of breath  Endocrine: Negative for endocrine symptoms  Musculoskeletal: Negative for musculoskeletal symptoms  Neurological:   Psychologic: Negative for psychiatric symptoms

## 2019-09-14 LAB — PSA: Prostate Specific Ag, Serum: <0.1 ng/mL (ref 0.0–4.0)

## 2019-09-17 ENCOUNTER — Encounter: Payer: Self-pay | Admitting: Urology

## 2019-09-18 NOTE — Progress Notes (Signed)
Letter sent via my chart

## 2019-10-18 DIAGNOSIS — J449 Chronic obstructive pulmonary disease, unspecified: Secondary | ICD-10-CM | POA: Diagnosis not present

## 2019-12-13 ENCOUNTER — Ambulatory Visit: Payer: Medicare Other | Admitting: Urology

## 2019-12-27 DIAGNOSIS — Z885 Allergy status to narcotic agent status: Secondary | ICD-10-CM | POA: Diagnosis not present

## 2019-12-27 DIAGNOSIS — R0689 Other abnormalities of breathing: Secondary | ICD-10-CM | POA: Diagnosis not present

## 2019-12-27 DIAGNOSIS — K4090 Unilateral inguinal hernia, without obstruction or gangrene, not specified as recurrent: Secondary | ICD-10-CM | POA: Diagnosis not present

## 2019-12-27 DIAGNOSIS — R531 Weakness: Secondary | ICD-10-CM | POA: Diagnosis not present

## 2019-12-27 DIAGNOSIS — R404 Transient alteration of awareness: Secondary | ICD-10-CM | POA: Diagnosis not present

## 2019-12-27 DIAGNOSIS — T50904A Poisoning by unspecified drugs, medicaments and biological substances, undetermined, initial encounter: Secondary | ICD-10-CM | POA: Diagnosis not present

## 2019-12-27 DIAGNOSIS — Z79899 Other long term (current) drug therapy: Secondary | ICD-10-CM | POA: Diagnosis not present

## 2019-12-27 DIAGNOSIS — Z743 Need for continuous supervision: Secondary | ICD-10-CM | POA: Diagnosis not present

## 2019-12-27 DIAGNOSIS — N2889 Other specified disorders of kidney and ureter: Secondary | ICD-10-CM | POA: Diagnosis not present

## 2019-12-27 DIAGNOSIS — Z7982 Long term (current) use of aspirin: Secondary | ICD-10-CM | POA: Diagnosis not present

## 2019-12-27 DIAGNOSIS — R6889 Other general symptoms and signs: Secondary | ICD-10-CM | POA: Diagnosis not present

## 2019-12-27 DIAGNOSIS — R627 Adult failure to thrive: Secondary | ICD-10-CM | POA: Diagnosis not present

## 2019-12-27 DIAGNOSIS — U071 COVID-19: Secondary | ICD-10-CM | POA: Diagnosis not present

## 2019-12-27 DIAGNOSIS — I714 Abdominal aortic aneurysm, without rupture: Secondary | ICD-10-CM | POA: Diagnosis not present

## 2019-12-27 DIAGNOSIS — R059 Cough, unspecified: Secondary | ICD-10-CM | POA: Diagnosis not present

## 2019-12-27 DIAGNOSIS — M6258 Muscle wasting and atrophy, not elsewhere classified, other site: Secondary | ICD-10-CM | POA: Diagnosis not present

## 2019-12-27 DIAGNOSIS — F17200 Nicotine dependence, unspecified, uncomplicated: Secondary | ICD-10-CM | POA: Diagnosis not present

## 2019-12-27 DIAGNOSIS — I10 Essential (primary) hypertension: Secondary | ICD-10-CM | POA: Diagnosis not present

## 2019-12-30 DIAGNOSIS — R0902 Hypoxemia: Secondary | ICD-10-CM | POA: Diagnosis not present

## 2019-12-30 DIAGNOSIS — R5381 Other malaise: Secondary | ICD-10-CM | POA: Diagnosis not present

## 2019-12-30 DIAGNOSIS — Z743 Need for continuous supervision: Secondary | ICD-10-CM | POA: Diagnosis not present

## 2020-02-04 NOTE — Progress Notes (Deleted)
02/06/2020 7:37 AM   Alex Lara 1940/12/29 [id number]  Referring provider: Asencion Noble, MD [address] [place],  [place] [address]  Prostate  HPI: [name] is a 80yo with a hx of prostate cancer. He was last seen over 1 year ago. He finished IMRT 1 year ago. He never returned for ADT. He only received 2 doses of firmagon. No recent PSA.  The has issues getting an erection which started when he received firmagon. He had a CVA in 01/2019. He was prescibed nitroglycerin but has not taken it in over 1 year.   PMH: Past Medical History:  Diagnosis Date  . Anxiety and depression   . Arteriosclerotic cardiovascular disease (ASCVD)    06/2008: DES to the RCA and LAD  . 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 Carpal tunnel syndrome   . Chronic anticoagulation   . Deep vein thrombosis ([place])   . Degenerative joint disease   . Gastroesophageal reflux disease   . Hyperlipidemia   . Hypertension   . Prostate cancer ([name])   . Tobacco abuse     Surgical History: Past Surgical History:  Procedure Laterality Date  . COLONOSCOPY  ?  Date  . INGUINAL HERNIA REPAIR    . ORIF-unknown fracture    . PROSTATE BIOPSY    . ROTATOR CUFF REPAIR    . TOTAL HIP ARTHROPLASTY  2004   Left    Home Medications:  Allergies as of 02/06/2020      Reactions   Codeine Nausea And Vomiting, Other (See Comments)   "go fuzzy"      Medication List       Accurate as of February 04, 2020  7:37 AM. If you have any questions, ask your nurse or doctor.        amoxicillin-clavulanate 875-125 MG tablet Commonly known as: AUGMENTIN Take 1 tablet by mouth daily.   atorvastatin 40 MG tablet Commonly known as: LIPITOR Take 1 tablet (40 mg total) by mouth daily at 6 PM.   nortriptyline 10 MG capsule Commonly known as: PAMELOR Take 30 mg by mouth at bedtime.   potassium chloride SA 20 MEQ tablet Commonly known as: KLOR-CON Take 20 mEq by mouth every evening.   tadalafil 20 MG  tablet Commonly known as: CIALIS Take 1 tablet (20 mg total) by mouth as needed for erectile dysfunction.   tamsulosin 0.4 MG Caps capsule Commonly known as: FLOMAX Take 1 capsule (0.4 mg total) by mouth daily.       Allergies:  Allergies  Allergen Reactions  . Codeine Nausea And Vomiting and Other (See Comments)    "go fuzzy"    Family History: Family History  Problem Relation Age of Onset  . Coronary artery disease Father   . Coronary artery disease Mother        died post-CABG  . Heart disease Mother   . Breast cancer Maternal Aunt   . Lung cancer Cousin   . Melanoma Daughter   . Prostate cancer Neg Hx     Social History:  reports that he has been smoking. He has a 82.50 pack-year smoking history. He has never used smokeless tobacco. He reports current alcohol use. He reports that he does not use drugs.  ROS: All other review of systems were reviewed and are negative except what is noted above in HPI  Physical Exam: There were no vitals taken for this visit.  Constitutional:  Alert and oriented, No acute distress. HEENT: [place]  AT, moist mucus membranes.  Trachea midline, no masses. Cardiovascular: No clubbing, cyanosis, or edema. Respiratory: Normal respiratory effort, no increased work of breathing. GI: Abdomen is soft, nontender, nondistended, no abdominal masses GU: No CVA tenderness.  Lymph: No cervical or inguinal lymphadenopathy. Skin: No rashes, bruises or suspicious lesions. Neurologic: Grossly intact, no focal deficits, moving all 4 extremities. Psychiatric: Normal mood and affect.  Laboratory Data: Lab Results  Component Value Date   WBC 5.5 01/30/2019   HGB 12.2 (L) 01/30/2019   HCT 36.0 (L) 01/30/2019   MCV 92.8 01/30/2019   PLT 146 (L) 01/30/2019    Lab Results  Component Value Date   CREATININE 1.10 01/30/2019    No results found for: PSA  No results found for: TESTOSTERONE  Lab Results  Component Value Date   HGBA1C 5.5 01/31/2019     Urinalysis    Component Value Date/Time   COLORURINE YELLOW 01/30/2019 1645   APPEARANCEUR Clear 09/13/2019 1337   LABSPEC 1.010 01/30/2019 1645   PHURINE 6.0 01/30/2019 1645   GLUCOSEU Negative 09/13/2019 [place] 01/30/2019 1645   BILIRUBINUR Negative 09/13/2019 [place] 01/30/2019 1645   PROTEINUR Negative 09/13/2019 1337   PROTEINUR NEGATIVE 01/30/2019 1645   UROBILINOGEN 0.2 11/05/2013 1908   NITRITE Negative 09/13/2019 1337   NITRITE NEGATIVE 01/30/2019 1645   LEUKOCYTESUR Negative 09/13/2019 1337   LEUKOCYTESUR NEGATIVE 01/30/2019 1645    Lab Results  Component Value Date   LABMICR Comment 09/13/2019    Pertinent Imaging:  No results found for this or any previous visit.  No results found for this or any previous visit.  No results found for this or any previous visit.  No results found for this or any previous visit.  Results for orders placed during the hospital encounter of 10/21/10  US Renal  Narrative *RADIOLOGY REPORT*  Clinical Data: Follow-up renal cyst.  Right flank pain  RENAL/URINARY TRACT ULTRASOUND COMPLETE  Comparison:  CT 07/31/2009  Findings:  Right Kidney:  12.6 cm in length.  Negative for obstruction.  Renal cortex is normal.  26 x 22 mm simple cyst on the right lower pole, similar to the prior CT.  Left Kidney:  11.0 cm in length.  Negative for obstruction.  Renal cortex is normal.  Negative for mass or cyst.  Bladder:  Negative  IMPRESSION: Right lower pole simple cysts.  Otherwise negative.  Original Report Authenticated By: Truett Perna, M.D.  No results found for this or any previous visit.  No results found for this or any previous visit.  No results found for this or any previous visit.   Assessment & Plan:    1. Malignant neoplasm of prostate (HCC) -PSa today -RTC 3 months with PSA - Urinalysis, Routine w reflex microscopic  2. BPh with weak stream -restart flomax  0.4mg    3. Erectile dysfunction -We will trial prn  No follow-ups on file.  Alex Seal, MD  [facility] [place]

## 2020-02-06 ENCOUNTER — Ambulatory Visit: Payer: Medicare Other | Admitting: Urology

## 2020-03-19 DIAGNOSIS — M7010 Bursitis, unspecified hand: Secondary | ICD-10-CM | POA: Diagnosis not present

## 2020-03-19 DIAGNOSIS — M7062 Trochanteric bursitis, left hip: Secondary | ICD-10-CM | POA: Diagnosis not present

## 2020-03-26 ENCOUNTER — Ambulatory Visit: Payer: Medicare Other | Admitting: Orthopedic Surgery

## 2020-03-26 ENCOUNTER — Encounter: Payer: Self-pay | Admitting: Orthopedic Surgery

## 2020-05-06 DIAGNOSIS — M79672 Pain in left foot: Secondary | ICD-10-CM | POA: Diagnosis not present

## 2020-05-06 DIAGNOSIS — L110 Acquired keratosis follicularis: Secondary | ICD-10-CM | POA: Diagnosis not present

## 2020-05-06 DIAGNOSIS — M79671 Pain in right foot: Secondary | ICD-10-CM | POA: Diagnosis not present

## 2020-05-06 DIAGNOSIS — I739 Peripheral vascular disease, unspecified: Secondary | ICD-10-CM | POA: Diagnosis not present

## 2020-05-06 DIAGNOSIS — M79674 Pain in right toe(s): Secondary | ICD-10-CM | POA: Diagnosis not present

## 2020-05-06 DIAGNOSIS — M79675 Pain in left toe(s): Secondary | ICD-10-CM | POA: Diagnosis not present

## 2020-05-09 DIAGNOSIS — K219 Gastro-esophageal reflux disease without esophagitis: Secondary | ICD-10-CM | POA: Diagnosis not present

## 2020-05-09 DIAGNOSIS — I2510 Atherosclerotic heart disease of native coronary artery without angina pectoris: Secondary | ICD-10-CM | POA: Diagnosis not present

## 2020-06-05 IMAGING — MR MR HEAD W/O CM
11 series · 44 of 48 positions shown · non-contrast
Comparison: Noncontrast head CT 01/30/2019, brain MRI 06/26/2017,

CLINICAL DATA: Stroke suspected. Slurred speech that was noticed
upon waking today at 10 a.m. History of prostate cancer.

EXAM:
MRI HEAD WITHOUT CONTRAST
MRA HEAD WITHOUT CONTRAST
TECHNIQUE: Multiplanar, multiecho pulse sequences of the brain and surrounding
structures were obtained without intravenous contrast. Angiographic
images of the head were obtained using MRA technique without
contrast.

[Series 2: DWI · axial · 3.0mm · 1.21mm/px · z∈[-2,+149]mm · 7 of 108 slices shown (1 of 4)]
[im 1/108]
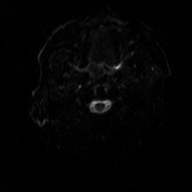
[im 18/108]
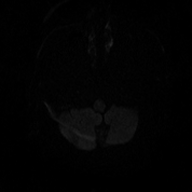
[im 36/108]
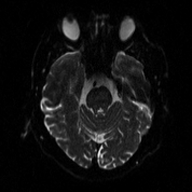
[im 54/108]
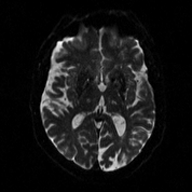
[im 72/108]
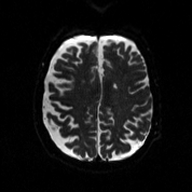
[im 90/108]
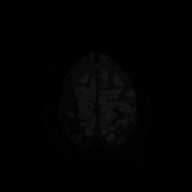
[im 108/108]
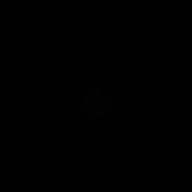

[Series 3: DWI · axial · 3.0mm · 1.21mm/px · z∈[-2,+149]mm · 4 of 55 slices shown (2 of 4)]
[im 1/55]
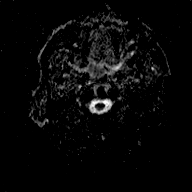
[im 19/55]
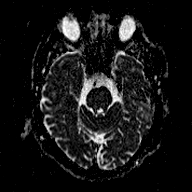
[im 37/55]
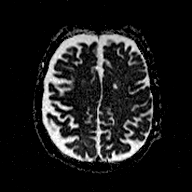
[im 55/55]
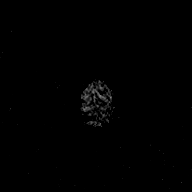

[Series 4: DWI · coronal · 3.0mm · 1.03mm/px · 7 of 91 slices shown (3 of 4)]
[im 1/91]
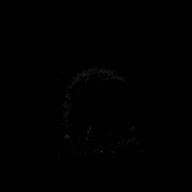
[im 16/91]
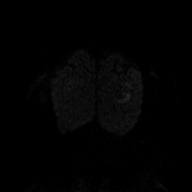
[im 31/91]
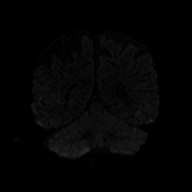
[im 46/91]
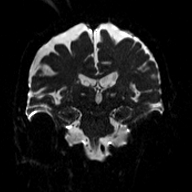
[im 61/91]
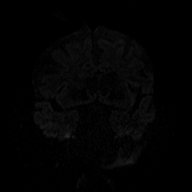
[im 76/91]
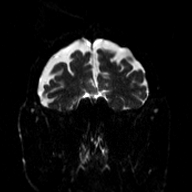
[im 91/91]
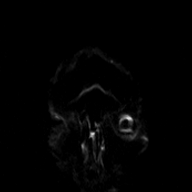

[Series 5: DWI · coronal · 3.0mm · 1.03mm/px · 4 of 49 slices shown (4 of 4)]
[im 1/49]
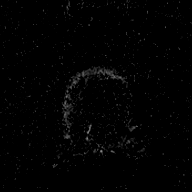
[im 17/49]
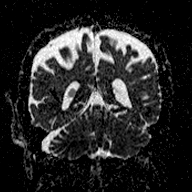
[im 33/49]
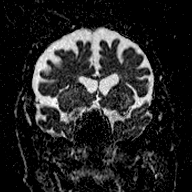
[im 49/49]
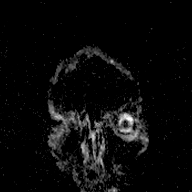

[Series 6: T1 · sagittal · 5.0mm · 0.49mm/px · 2 of 25 slices shown (1 of 2)]
[im 1/25]
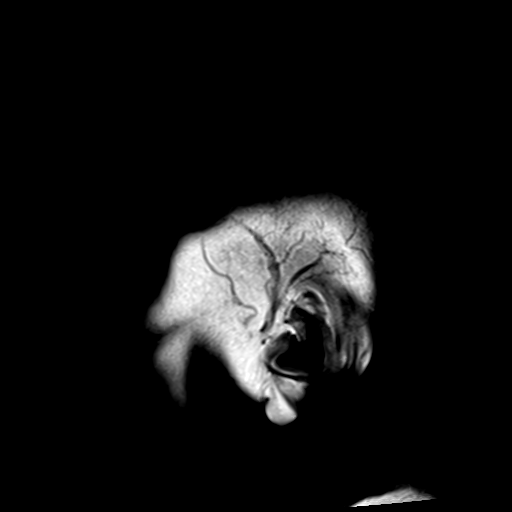
[im 25/25]
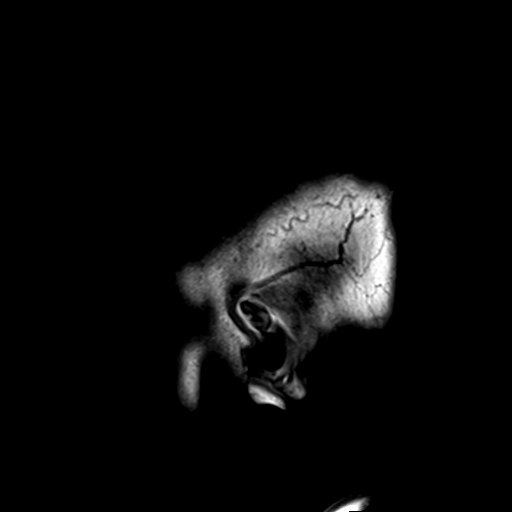

[Series 7: T2 · axial · 5.0mm · 0.72mm/px · z∈[+0,+145]mm · 2 of 23 slices shown (1 of 3)]
[im 1/23]
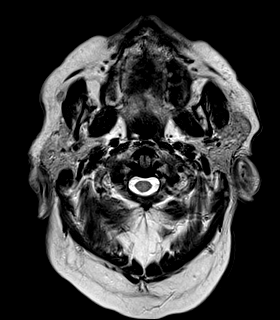
[im 23/23]
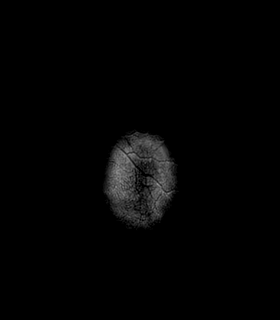

[Series 8: FLAIR · axial · 3.0mm · 0.45mm/px · z∈[-4,+148]mm · 4 of 55 slices shown]
[im 1/55]
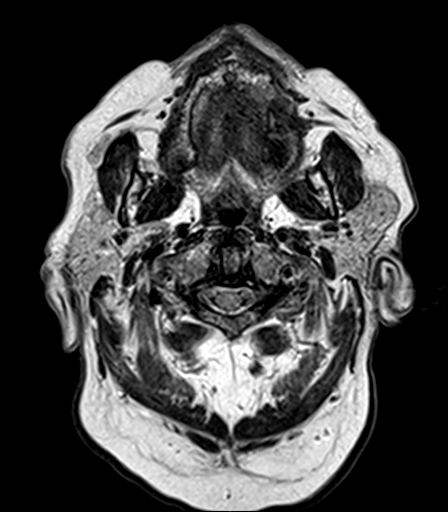
[im 19/55]
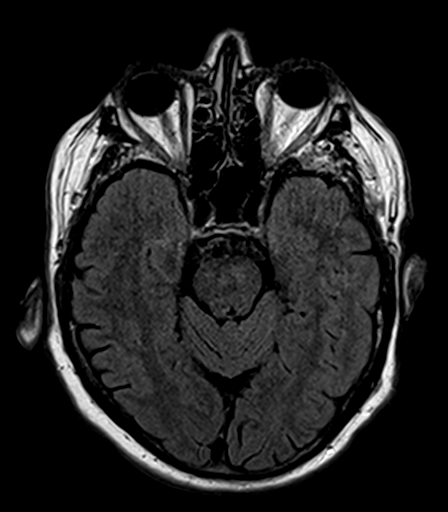
[im 37/55]
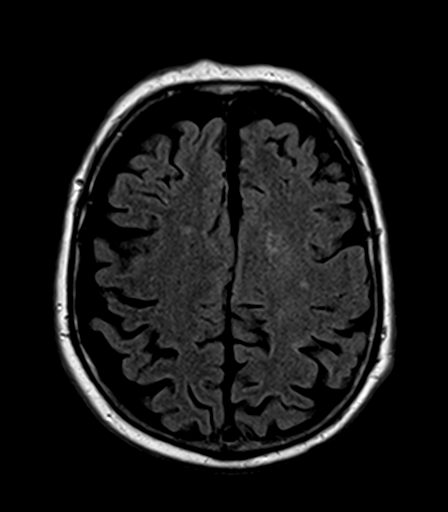
[im 55/55]
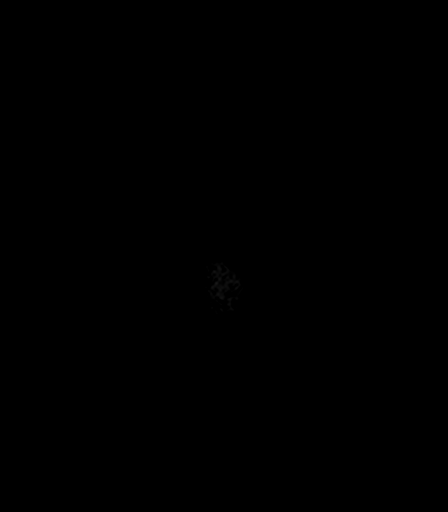

[Series 9: T2 · axial · 5.0mm · 0.72mm/px · z∈[+0,+145]mm · 2 of 23 slices shown (2 of 3)]
[im 1/23]
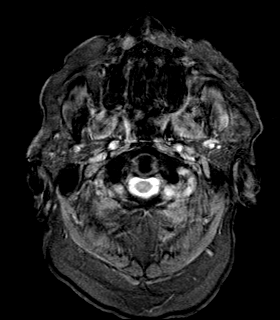
[im 23/23]
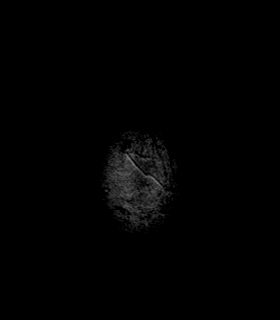

[Series 10: T1 · axial · 1.0mm · 0.89mm/px · z∈[+2,+151]mm · 8 of 160 slices shown (2 of 2)]
[im 1/160]
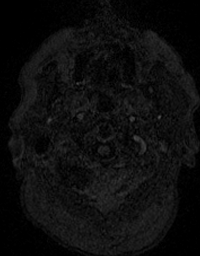
[im 29/160]
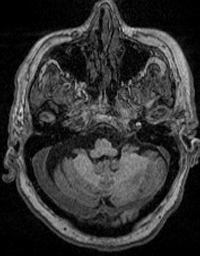
[im 44/160]
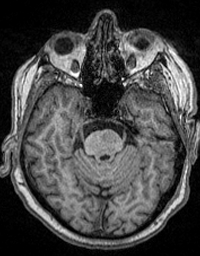
[im 73/160]
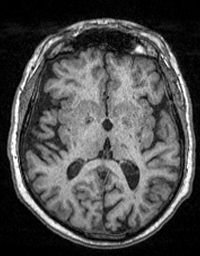
[im 87/160]
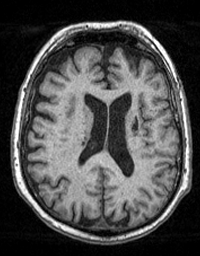
[im 116/160]
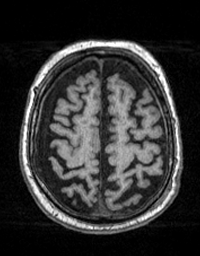
[im 131/160]
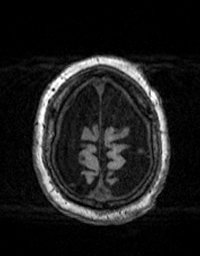
[im 160/160]
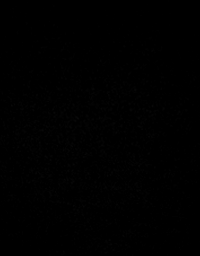

[Series 11: trauma axial · axial · 5.0mm · 0.45mm/px · z∈[+1,+147]mm · 2 of 25 slices shown]
[im 1/25]
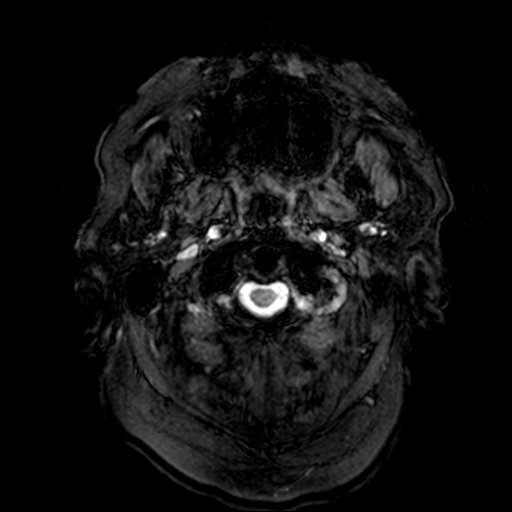
[im 25/25]
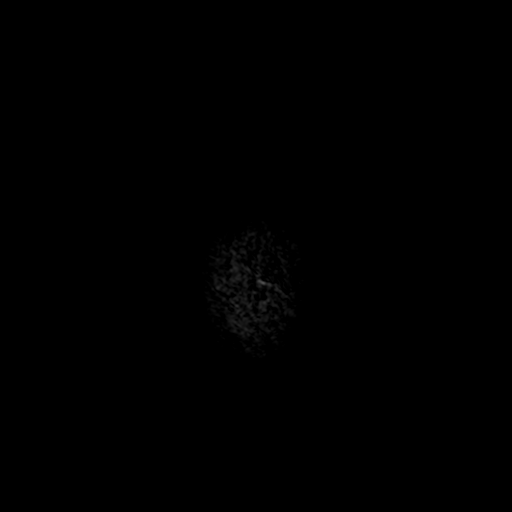

[Series 12: T2 · coronal · 5.0mm · 0.40mm/px · 2 of 29 slices shown (3 of 3)]
[im 1/29]
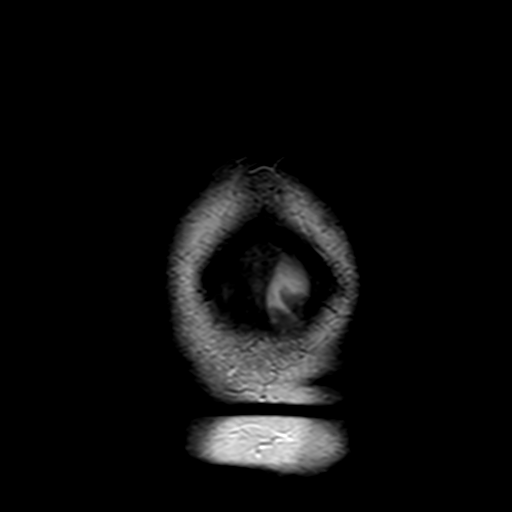
[im 29/29]
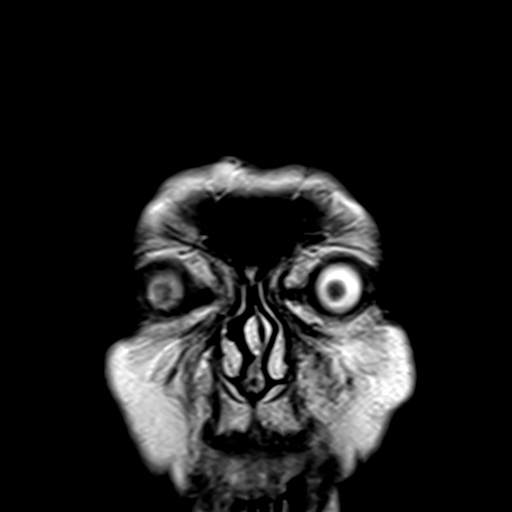

[44 of 48 positions shown; findings below may reference images not displayed]

FINDINGS: MRI HEAD FINDINGS

Brain:

Intermittent motion degradation. This includes mild motion
degradation of the diffusion-weighted imaging.

There are several small acute cortical infarcts within the
posterolateral left frontal lobe. Additionally, there is a small
cortically based infarct in the left occipital lobe which
demonstrates diffusion weighted hyperintensity but no corresponding
ADC hypointensity, likely late subacute.

Background chronic small vessel ischemic disease has progressed.
Chronic lacunar infarcts within the left corona radiata, left
thalamus and right basal ganglia were not present on prior MRI.
Redemonstrated chronic lacunar infarcts within the right thalamus
and within the bilateral cerebellar hemispheres.

No evidence of intracranial mass. No midline shift or extra-axial
fluid collection. No chronic intracranial blood products.

Vascular: Reported separately

Skull and upper cervical spine: No focal marrow lesion

Sinuses/Orbits: Visualized orbits demonstrate no acute abnormality.
Mild ethmoid sinus mucosal thickening. Trace fluid within left
mastoid air cells

MRA HEAD FINDINGS

The intracranial internal carotids are patent without significant
stenosis. 2 mm medially projecting vascular protrusion arising from
the cavernous left ICA consistent with small aneurysm (series 2,
image 61). There is a 1-2 mm inferiorly projecting vascular
protrusion arising from the supraclinoid left ICA which may reflect
an infundibulum or tiny aneurysm (series 2, image 69).

The right middle cerebral artery is patent. Apparent high-grade
focal stenosis within the mid to distal M1 right middle cerebral
artery (series 101, image 9). There is also high-grade focal
stenosis within a proximal right M2 branch vessel. The right
anterior cerebral artery is patent without high-grade proximal
stenosis.

The M1 left middle cerebral artery is patent without significant
stenosis. Atherosclerotic irregularity with attenuated appearance of
a proximal to mid left M2 branch vessel. The left anterior cerebral
artery is patent without high-grade proximal stenosis.

The non dominant intracranial right vertebral artery is
developmentally diminutive and appears to terminate predominantly as
the right PICA. The dominant intracranial left vertebral artery is
patent without significant stenosis, as is the basilar artery.
Predominantly fetal origin of the right posterior cerebral artery
which is patent without significant proximal stenosis. The left
posterior cerebral artery is patent proximally. High-grade focal
stenosis within distal P2 left PCA branch. The more distal left
posterior cerebral artery demonstrates significant atherosclerotic
irregularity. Hypoplastic or absent left posterior communicating
artery.
IMPRESSION: MRI brain:

1. Motion degraded exam.
2. Several small acute cortical infarcts within the posterolateral
left frontal lobe.
3. Small late subacute left occipital lobe cortically based infarct.
4. Background chronic small vessel ischemic disease has progressed
since MRI 06/26/2017. Chronic lacunar infarcts within the left
corona radiata, left thalamus and right basal ganglia were not
present on this prior exam. Redemonstrated chronic lacunar infarcts
within the right thalamus and bilateral cerebellar hemispheres.
5. Moderate generalized parenchymal atrophy

MRA head:

1. Intracranial atherosclerotic disease with multifocal stenoses,
most notably as follows.
2. High-grade focal stenosis within the mid to distal M1 right
middle cerebral artery. There is also high-grade focal stenosis
within a proximal right M2 branch vessel.
3. Significant atherosclerotic irregularity with attenuated
appearance of a proximal to mid left M2 branch vessel.
4. High-grade focal stenosis within a distal P2 left PCA branch.
Significant atherosclerotic irregularity of the left PCA more
distally.
5. 2 mm medially projecting aneurysm arising from the cavernous left
ICA.
6. 1-2 mm infundibulum versus tiny aneurysm arising from the
supraclinoid left ICA.

## 2020-06-05 IMAGING — MR MR MRA HEAD W/O CM
1 series · 14 of 48 positions shown · non-contrast
Comparison: Noncontrast head CT 01/30/2019, brain MRI 06/26/2017,

CLINICAL DATA: Stroke suspected. Slurred speech that was noticed
upon waking today at 10 a.m. History of prostate cancer.

EXAM:
MRI HEAD WITHOUT CONTRAST
MRA HEAD WITHOUT CONTRAST
TECHNIQUE: Multiplanar, multiecho pulse sequences of the brain and surrounding
structures were obtained without intravenous contrast. Angiographic
images of the head were obtained using MRA technique without
contrast.

[Series 2: MRA · axial · 0.8mm · 0.38mm/px · z∈[-22,+72]mm · 14 of 131 slices shown]
[im 1/131]
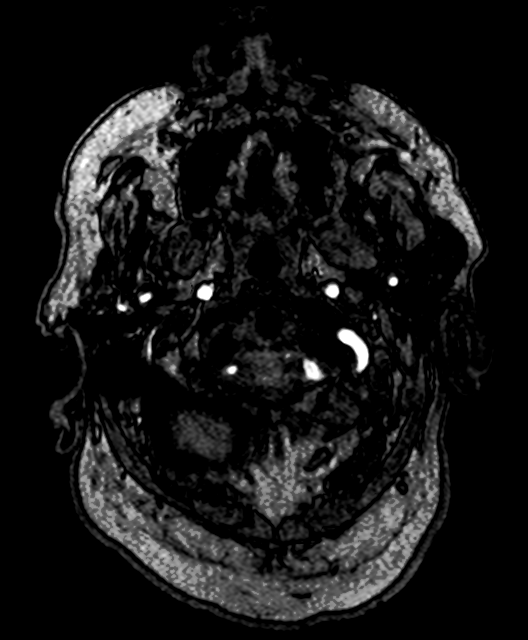
[im 3/131]
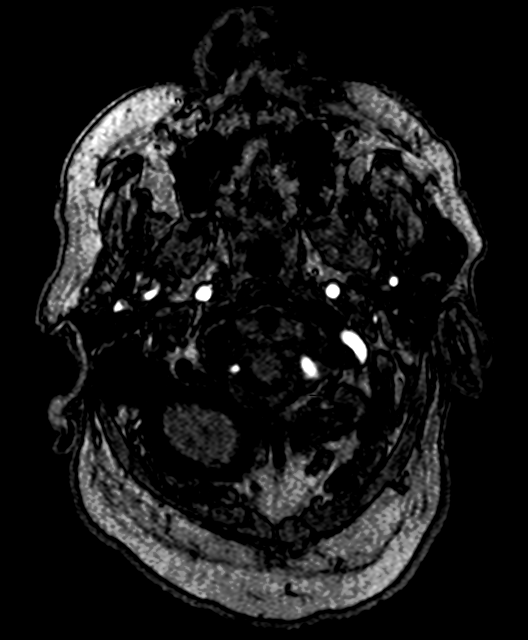
[im 6/131]
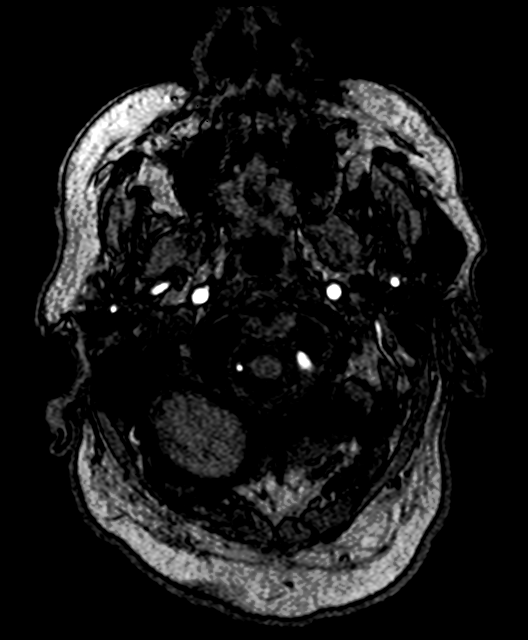
[im 9/131]
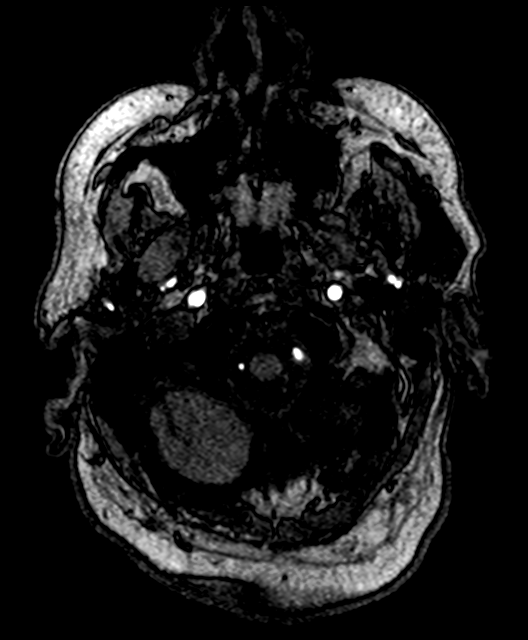
[im 23/131]
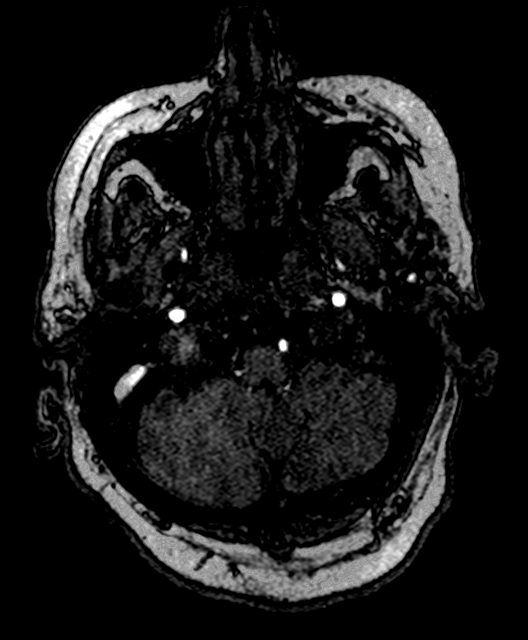
[im 25/131]
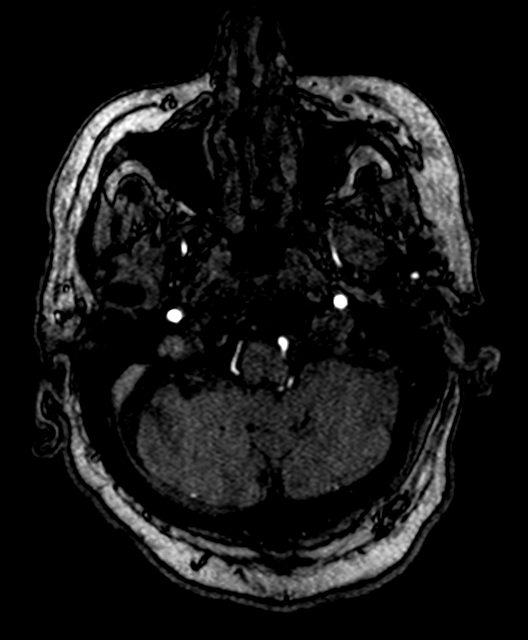
[im 42/131]
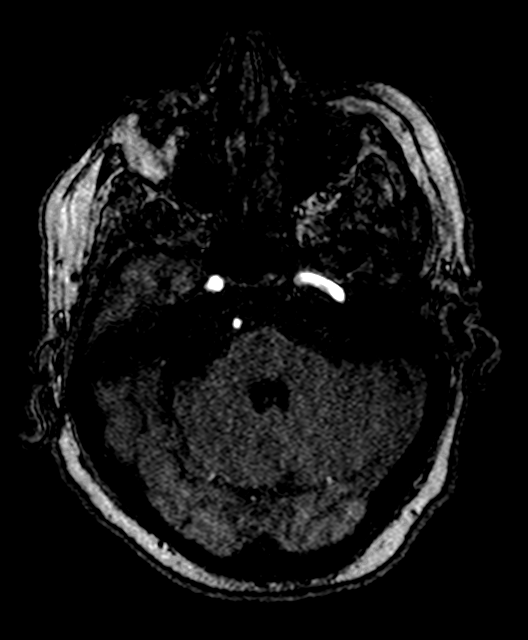
[im 59/131]
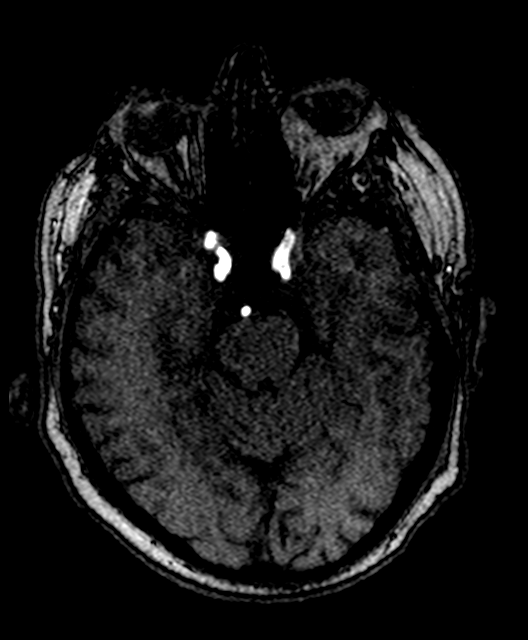
[im 67/131]
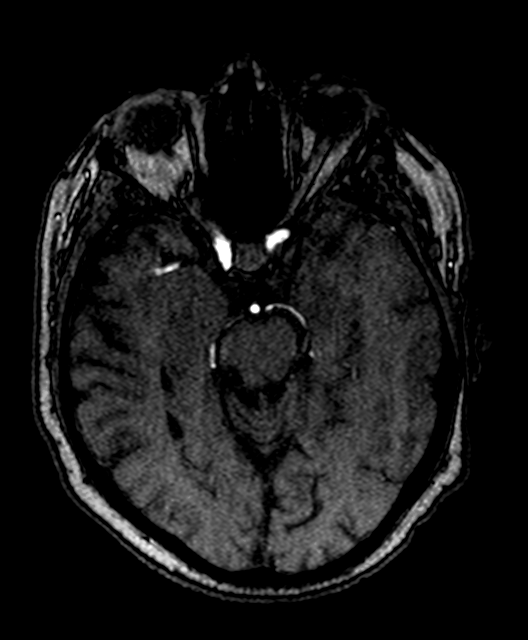
[im 75/131]
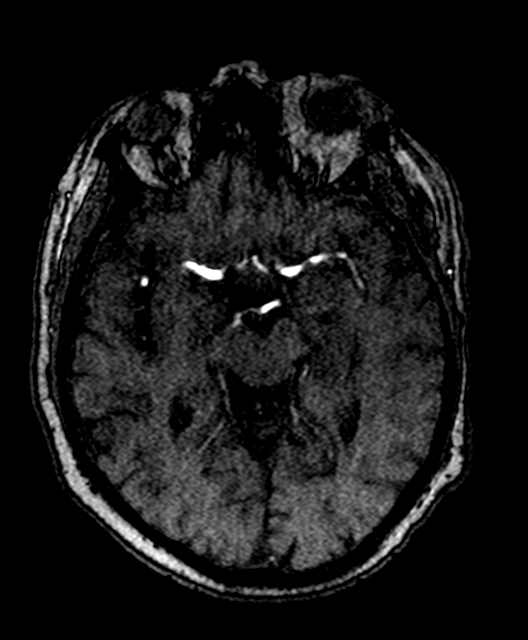
[im 92/131]
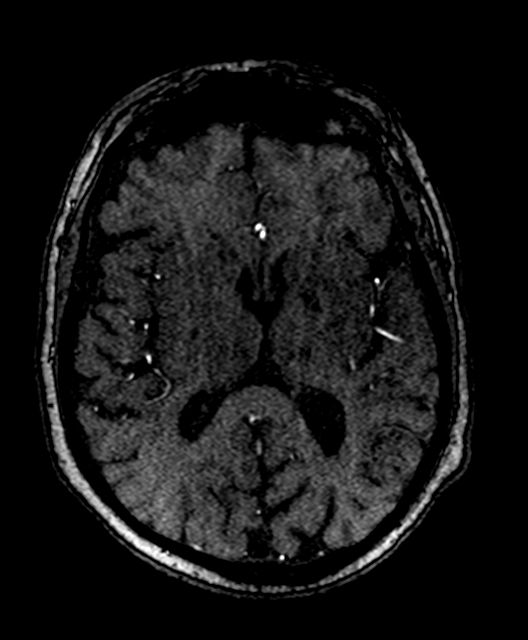
[im 108/131]
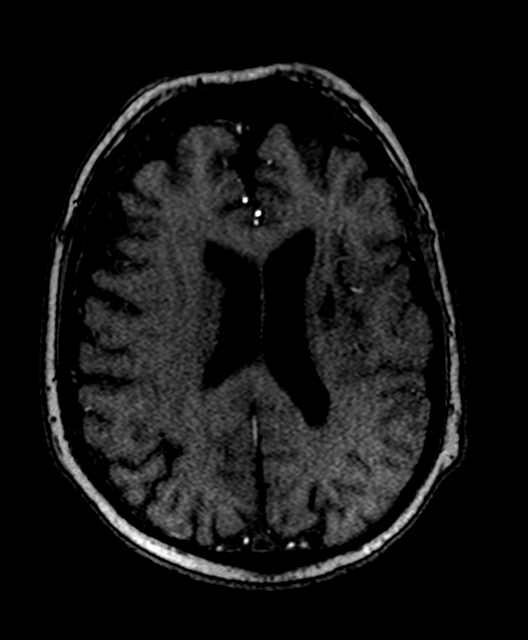
[im 111/131]
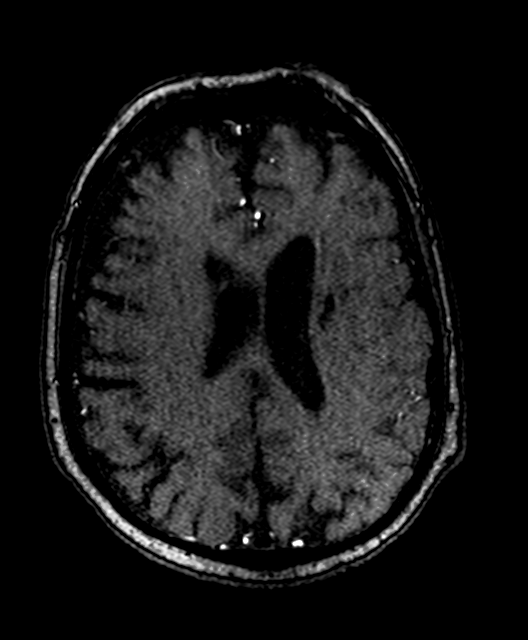
[im 125/131]
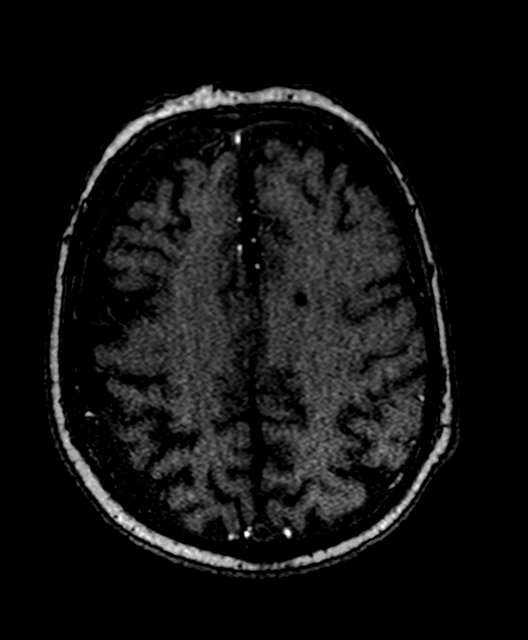

[14 of 48 positions shown; findings below may reference images not displayed]

FINDINGS: MRI HEAD FINDINGS

Brain:

Intermittent motion degradation. This includes mild motion
degradation of the diffusion-weighted imaging.

There are several small acute cortical infarcts within the
posterolateral left frontal lobe. Additionally, there is a small
cortically based infarct in the left occipital lobe which
demonstrates diffusion weighted hyperintensity but no corresponding
ADC hypointensity, likely late subacute.

Background chronic small vessel ischemic disease has progressed.
Chronic lacunar infarcts within the left corona radiata, left
thalamus and right basal ganglia were not present on prior MRI.
Redemonstrated chronic lacunar infarcts within the right thalamus
and within the bilateral cerebellar hemispheres.

No evidence of intracranial mass. No midline shift or extra-axial
fluid collection. No chronic intracranial blood products.

Vascular: Reported separately

Skull and upper cervical spine: No focal marrow lesion

Sinuses/Orbits: Visualized orbits demonstrate no acute abnormality.
Mild ethmoid sinus mucosal thickening. Trace fluid within left
mastoid air cells

MRA HEAD FINDINGS

The intracranial internal carotids are patent without significant
stenosis. 2 mm medially projecting vascular protrusion arising from
the cavernous left ICA consistent with small aneurysm (series 2,
image 61). There is a 1-2 mm inferiorly projecting vascular
protrusion arising from the supraclinoid left ICA which may reflect
an infundibulum or tiny aneurysm (series 2, image 69).

The right middle cerebral artery is patent. Apparent high-grade
focal stenosis within the mid to distal M1 right middle cerebral
artery (series 101, image 9). There is also high-grade focal
stenosis within a proximal right M2 branch vessel. The right
anterior cerebral artery is patent without high-grade proximal
stenosis.

The M1 left middle cerebral artery is patent without significant
stenosis. Atherosclerotic irregularity with attenuated appearance of
a proximal to mid left M2 branch vessel. The left anterior cerebral
artery is patent without high-grade proximal stenosis.

The non dominant intracranial right vertebral artery is
developmentally diminutive and appears to terminate predominantly as
the right PICA. The dominant intracranial left vertebral artery is
patent without significant stenosis, as is the basilar artery.
Predominantly fetal origin of the right posterior cerebral artery
which is patent without significant proximal stenosis. The left
posterior cerebral artery is patent proximally. High-grade focal
stenosis within distal P2 left PCA branch. The more distal left
posterior cerebral artery demonstrates significant atherosclerotic
irregularity. Hypoplastic or absent left posterior communicating
artery.
IMPRESSION: MRI brain:

1. Motion degraded exam.
2. Several small acute cortical infarcts within the posterolateral
left frontal lobe.
3. Small late subacute left occipital lobe cortically based infarct.
4. Background chronic small vessel ischemic disease has progressed
since MRI 06/26/2017. Chronic lacunar infarcts within the left
corona radiata, left thalamus and right basal ganglia were not
present on this prior exam. Redemonstrated chronic lacunar infarcts
within the right thalamus and bilateral cerebellar hemispheres.
5. Moderate generalized parenchymal atrophy

MRA head:

1. Intracranial atherosclerotic disease with multifocal stenoses,
most notably as follows.
2. High-grade focal stenosis within the mid to distal M1 right
middle cerebral artery. There is also high-grade focal stenosis
within a proximal right M2 branch vessel.
3. Significant atherosclerotic irregularity with attenuated
appearance of a proximal to mid left M2 branch vessel.
4. High-grade focal stenosis within a distal P2 left PCA branch.
Significant atherosclerotic irregularity of the left PCA more
distally.
5. 2 mm medially projecting aneurysm arising from the cavernous left
ICA.
6. 1-2 mm infundibulum versus tiny aneurysm arising from the
supraclinoid left ICA.

## 2020-06-05 IMAGING — CT CT HEAD CODE STROKE
3 series · 15 of 47 positions shown, 18 images · non-contrast
Comparison: CT head 11/05/2013

CLINICAL DATA: Code stroke.  Ataxia.  Slurred speech.

EXAM:
CT HEAD WITHOUT CONTRAST
TECHNIQUE: Contiguous axial images were obtained from the base of the skull
through the vertex without intravenous contrast.

[Series 2: head w o · axial · 0.49mm/px · z∈[+1596,+1731]mm · 9 of 33 slices shown, 12 images]
[im 3/33  brain]
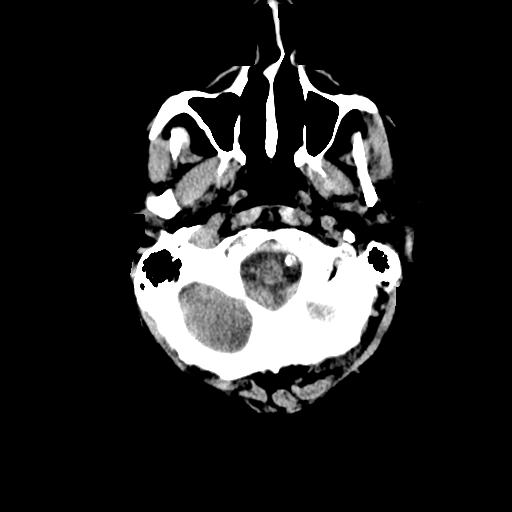
[im 3/33  bone]
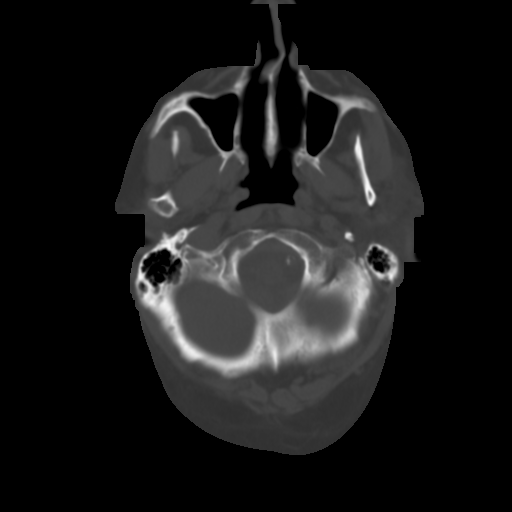
[im 6/33  brain]
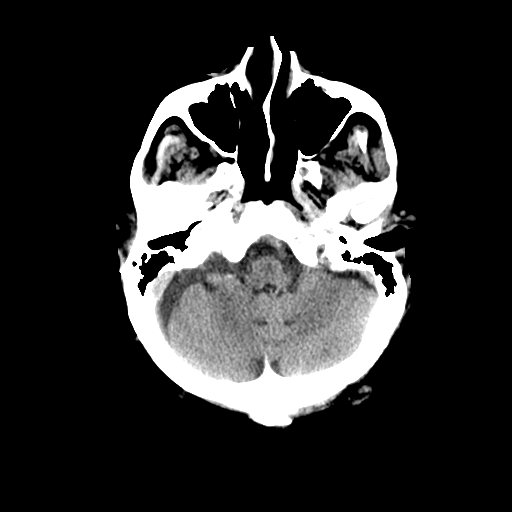
[im 9/33  brain]
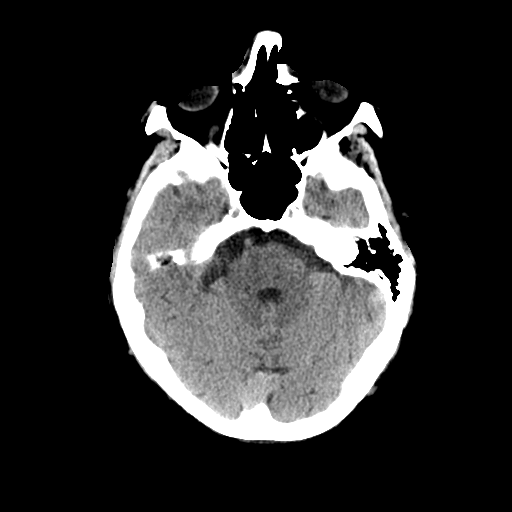
[im 13/33  brain]
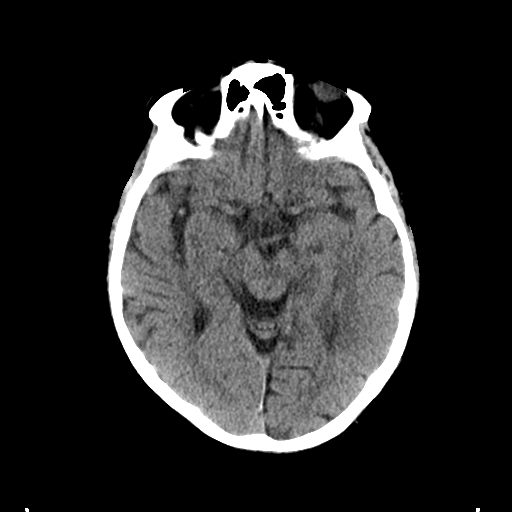
[im 17/33  brain]
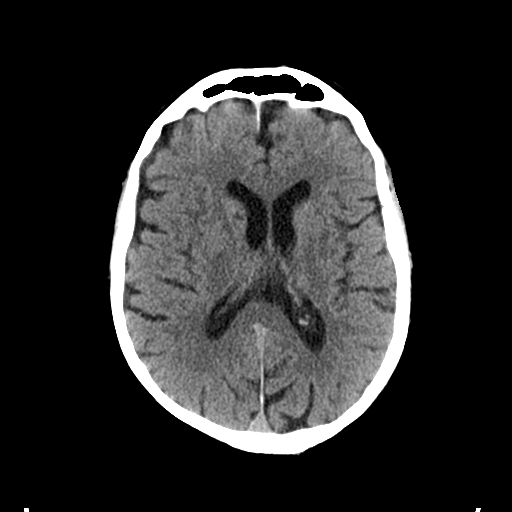
[im 17/33  bone]
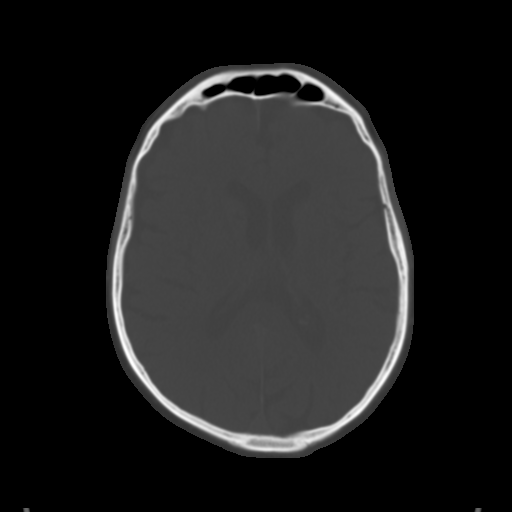
[im 20/33  brain]
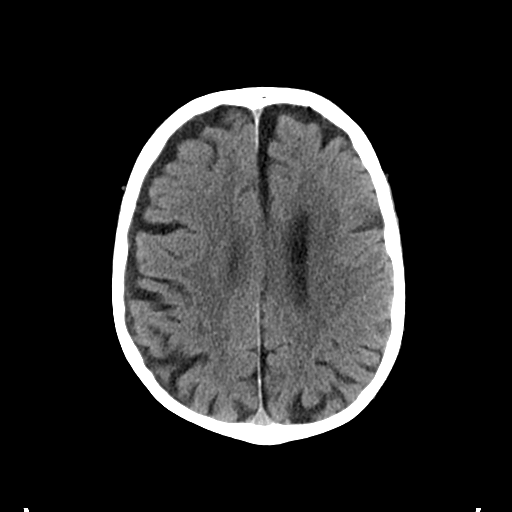
[im 24/33  brain]
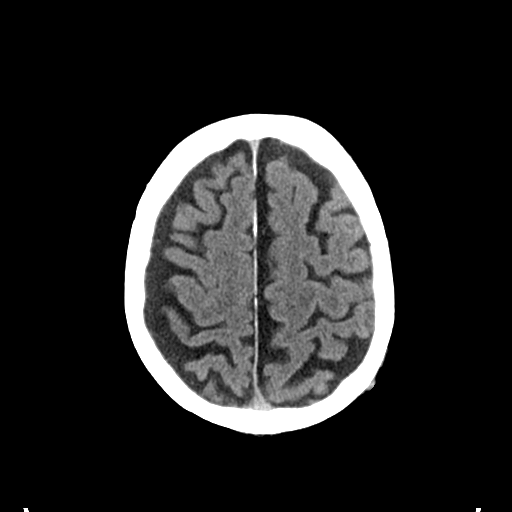
[im 27/33  brain]
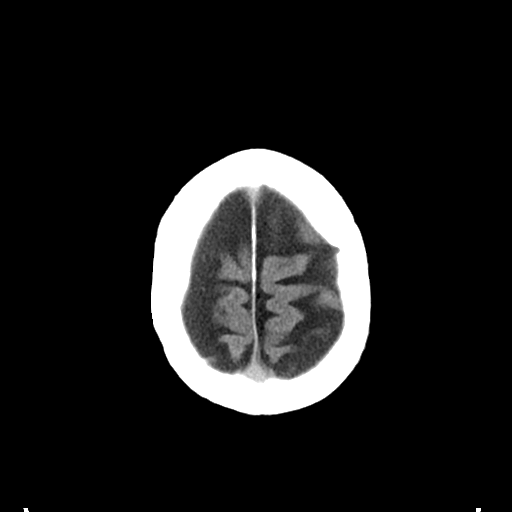
[im 30/33  brain]
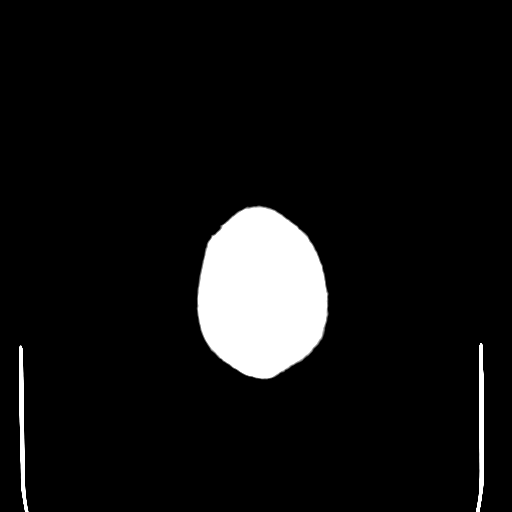
[im 30/33  bone]
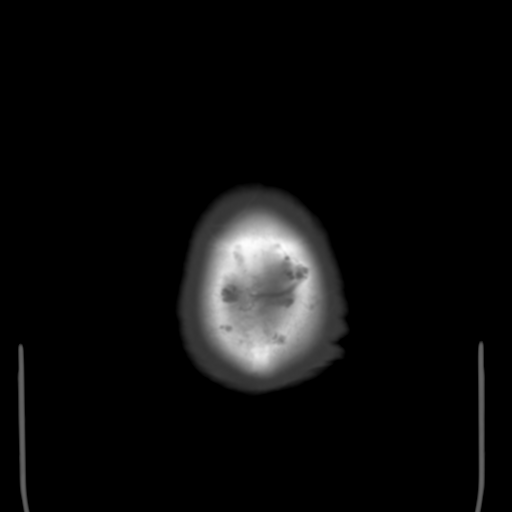

[Series 4: coronal soft · coronal · 0.36mm/px · 3 of 70 slices shown]
[im 24/70  brain]
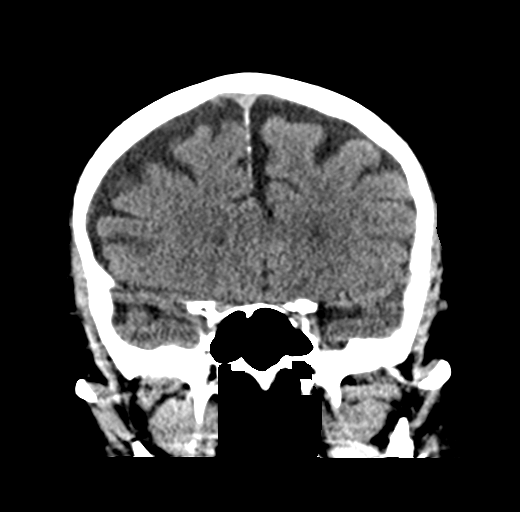
[im 31/70  brain]
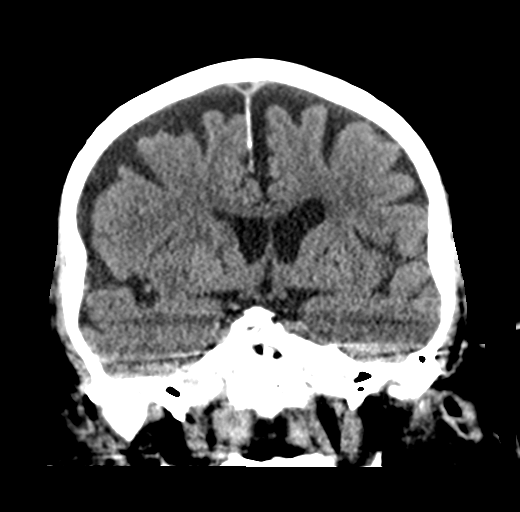
[im 39/70  brain]
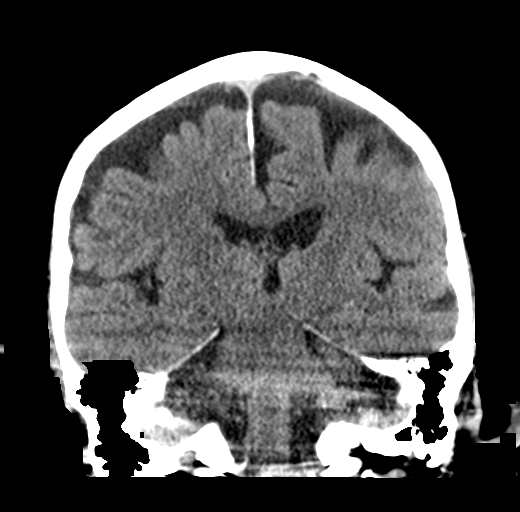

[Series 5: sagittal soft · sagittal · 0.37mm/px · 3 of 59 slices shown]
[im 20/59  brain]
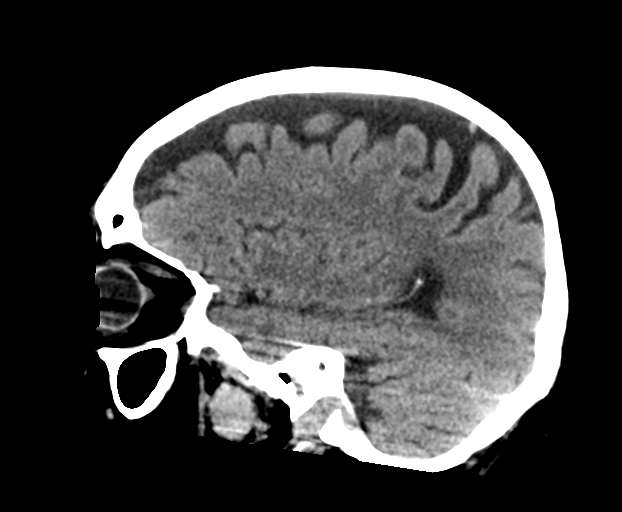
[im 30/59  brain]
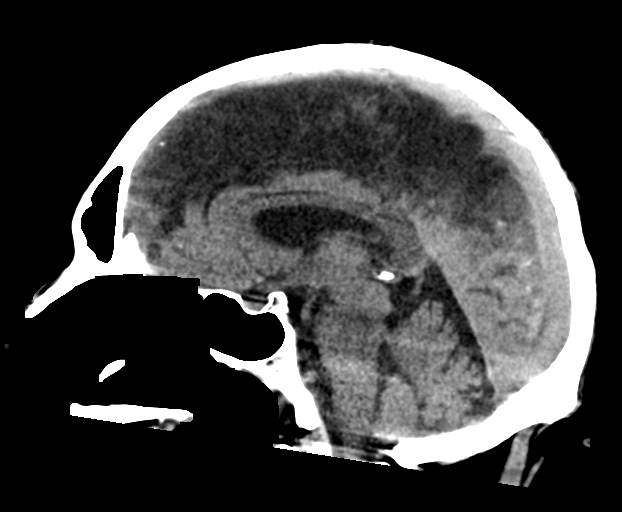
[im 39/59  brain]
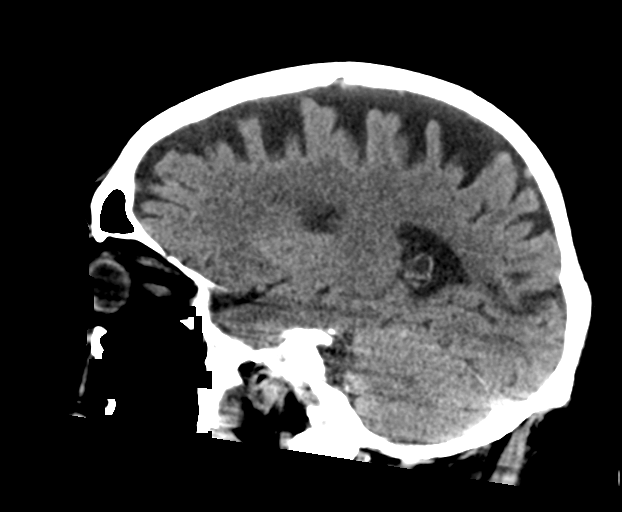

[15 of 47 positions shown; findings below may reference images not displayed]

FINDINGS: Brain: Mild atrophy. Ventricle size normal. Chronic infarct left
Corona radiata, not present previously. Mild chronic ischemic change
right basal ganglia. Asymmetric calcification right basal ganglia
unchanged. Chronic infarct left occipital lobe, new since the prior
CT.

Negative for acute infarct.  Negative for hemorrhage or mass.

Vascular: Negative for hyperdense vessel. Atherosclerotic disease in
the carotid and vertebral arteries bilaterally.

Skull: Negative

Sinuses/Orbits: Negative

Other: None

ASPECTS (Alberta Stroke Program Early CT Score)

- Ganglionic level infarction (caudate, lentiform nuclei, internal
capsule, insula, M1-M3 cortex): 7

- Supraganglionic infarction (M4-M6 cortex): 3

Total score (0-10 with 10 being normal): 10
IMPRESSION: 1. No acute abnormality
2. ASPECTS is 10
3. Chronic ischemic change in the left corona radiata. Chronic
infarct left occipital lobe. Mild chronic ischemic change right
basal ganglia. Negative for hemorrhage.
4. These results were called by telephone at the time of
interpretation on 01/30/2019 at [DATE] to provider RUSLANPUBG MAKHMUDOV ,
who verbally acknowledged these results.

## 2020-06-24 DIAGNOSIS — K219 Gastro-esophageal reflux disease without esophagitis: Secondary | ICD-10-CM | POA: Diagnosis not present

## 2020-06-24 DIAGNOSIS — Z79899 Other long term (current) drug therapy: Secondary | ICD-10-CM | POA: Diagnosis not present

## 2020-06-24 DIAGNOSIS — I2510 Atherosclerotic heart disease of native coronary artery without angina pectoris: Secondary | ICD-10-CM | POA: Diagnosis not present

## 2020-06-24 DIAGNOSIS — J449 Chronic obstructive pulmonary disease, unspecified: Secondary | ICD-10-CM | POA: Diagnosis not present

## 2020-06-30 DIAGNOSIS — E785 Hyperlipidemia, unspecified: Secondary | ICD-10-CM | POA: Diagnosis not present

## 2020-06-30 DIAGNOSIS — Z8673 Personal history of transient ischemic attack (TIA), and cerebral infarction without residual deficits: Secondary | ICD-10-CM | POA: Diagnosis not present

## 2020-06-30 DIAGNOSIS — N1831 Chronic kidney disease, stage 3a: Secondary | ICD-10-CM | POA: Diagnosis not present

## 2020-06-30 DIAGNOSIS — I2510 Atherosclerotic heart disease of native coronary artery without angina pectoris: Secondary | ICD-10-CM | POA: Diagnosis not present

## 2020-07-09 DIAGNOSIS — I2510 Atherosclerotic heart disease of native coronary artery without angina pectoris: Secondary | ICD-10-CM | POA: Diagnosis not present

## 2020-07-09 DIAGNOSIS — K219 Gastro-esophageal reflux disease without esophagitis: Secondary | ICD-10-CM | POA: Diagnosis not present

## 2020-08-21 ENCOUNTER — Ambulatory Visit: Payer: Medicare Other | Admitting: Urology

## 2020-08-28 DIAGNOSIS — I6300 Cerebral infarction due to thrombosis of unspecified precerebral artery: Secondary | ICD-10-CM | POA: Diagnosis not present

## 2020-08-28 DIAGNOSIS — Z79899 Other long term (current) drug therapy: Secondary | ICD-10-CM | POA: Diagnosis not present

## 2020-08-28 DIAGNOSIS — N1831 Chronic kidney disease, stage 3a: Secondary | ICD-10-CM | POA: Diagnosis not present

## 2020-08-28 DIAGNOSIS — Z8673 Personal history of transient ischemic attack (TIA), and cerebral infarction without residual deficits: Secondary | ICD-10-CM | POA: Diagnosis not present

## 2020-10-19 ENCOUNTER — Ambulatory Visit: Payer: Medicare Other | Admitting: Urology

## 2020-10-21 ENCOUNTER — Ambulatory Visit: Payer: Medicare Other | Admitting: Urology

## 2020-10-28 ENCOUNTER — Ambulatory Visit: Payer: Medicare Other | Admitting: Urology

## 2020-10-28 DIAGNOSIS — R3912 Poor urinary stream: Secondary | ICD-10-CM

## 2020-10-30 ENCOUNTER — Ambulatory Visit: Payer: Medicare Other | Admitting: Urology

## 2020-12-01 DIAGNOSIS — N1831 Chronic kidney disease, stage 3a: Secondary | ICD-10-CM | POA: Diagnosis not present

## 2020-12-07 DIAGNOSIS — Z8673 Personal history of transient ischemic attack (TIA), and cerebral infarction without residual deficits: Secondary | ICD-10-CM | POA: Diagnosis not present

## 2020-12-07 DIAGNOSIS — N1831 Chronic kidney disease, stage 3a: Secondary | ICD-10-CM | POA: Diagnosis not present

## 2020-12-07 DIAGNOSIS — Z23 Encounter for immunization: Secondary | ICD-10-CM | POA: Diagnosis not present

## 2020-12-07 DIAGNOSIS — I10 Essential (primary) hypertension: Secondary | ICD-10-CM | POA: Diagnosis not present

## 2021-01-21 ENCOUNTER — Ambulatory Visit: Payer: Medicare Other | Admitting: Orthopedic Surgery

## 2021-03-01 ENCOUNTER — Ambulatory Visit: Payer: Medicare Other | Admitting: Orthopedic Surgery

## 2021-03-15 ENCOUNTER — Ambulatory Visit: Payer: Medicare Other

## 2021-03-15 ENCOUNTER — Other Ambulatory Visit: Payer: Self-pay

## 2021-03-15 ENCOUNTER — Ambulatory Visit: Payer: Medicare Other | Admitting: Orthopedic Surgery

## 2021-03-15 ENCOUNTER — Encounter: Payer: Self-pay | Admitting: Orthopedic Surgery

## 2021-03-15 VITALS — BP 118/75 | HR 68 | Ht 73.0 in | Wt 197.6 lb

## 2021-03-15 DIAGNOSIS — M542 Cervicalgia: Secondary | ICD-10-CM | POA: Diagnosis not present

## 2021-03-15 DIAGNOSIS — M5030 Other cervical disc degeneration, unspecified cervical region: Secondary | ICD-10-CM

## 2021-03-15 DIAGNOSIS — I10 Essential (primary) hypertension: Secondary | ICD-10-CM | POA: Diagnosis not present

## 2021-03-15 DIAGNOSIS — N1831 Chronic kidney disease, stage 3a: Secondary | ICD-10-CM | POA: Diagnosis not present

## 2021-03-15 DIAGNOSIS — I639 Cerebral infarction, unspecified: Secondary | ICD-10-CM | POA: Diagnosis not present

## 2021-03-15 MED ORDER — MELOXICAM 7.5 MG PO TABS: 7.5000 mg | ORAL_TABLET | Freq: Every day | ORAL | 5 refills | Status: DC

## 2021-03-15 MED ORDER — TIZANIDINE HCL 4 MG PO TABS: 4.0000 mg | ORAL_TABLET | Freq: Four times a day (QID) | ORAL | 0 refills | Status: DC | PRN

## 2021-03-15 NOTE — Progress Notes (Signed)
Chief Complaint  ?Patient presents with  ? Shoulder Pain  ?  Bilateral//hx of shoulder sx on both shoulders  ? ? ?HPI: 81 year old male actually came in requesting injections in his right shoulder and left shoulder however his symptoms are pain in the trapezius muscle cervical spine radiates from the shoulder to the neck ? ?He has had surgery on his shoulders in the past he has had injections there in the past ? ?He has no pain with range of motion of his shoulder on either side ? ? ? ?Past Medical History:  ?Diagnosis Date  ? Anxiety and depression   ? Arteriosclerotic cardiovascular disease (ASCVD)   ? 06/2008: DES to the RCA and LAD  ? 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 Carpal tunnel syndrome   ? Chronic anticoagulation   ? Deep vein thrombosis ([place])   ? Degenerative joint disease   ? Gastroesophageal reflux disease   ? Hyperlipidemia   ? Hypertension   ? Prostate cancer ([place])   ? Tobacco abuse   ? ? ?BP 118/75   Pulse 68   Ht '6\' 1"'$  (1.854 m)   Wt 197 lb 9.6 oz (89.6 kg)   BMI 26.07 kg/m?  ? ? ?General appearance: Well-developed well-nourished no gross deformities ? ?Cardiovascular normal pulse and perfusion normal color without edema ? ?Neurologically no sensation loss or deficits or pathologic reflexes, elbow reflexes 2+ and equal ? ?Psychological: Awake alert and oriented x3 mood and affect normal ? ?Skin no lacerations or ulcerations no nodularity no palpable masses, no erythema or nodularity ? ?Musculoskeletal:  ?All tenderness is based around the cervical spine and right and left trapezium he has central tenderness at the base of the cervical spine with decreased range of motion ? ?Normal range of motion both shoulders without reproduction of symptoms ? ?Imaging  ?5 view C-spine taken in the office today uncovertebral joint arthrosis middle cervical spine ? ?A/P ? 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 ?Meds ordered this encounter   ?Medications  ? meloxicam (MOBIC) 7.5 MG tablet  ?  Sig: Take 1 tablet (7.5 mg total) by mouth daily.  ?  Dispense:  30 tablet  ?  Refill:  5  ? tiZANidine (ZANAFLEX) 4 MG tablet  ?  Sig: Take 1 tablet (4 mg total) by mouth every 6 (six) hours as needed for muscle spasms.  ?  Dispense:  30 tablet  ?  Refill:  0  ? ?I will see him in roughly 2 weeks to address his hip pain  ?

## 2021-03-15 NOTE — Patient Instructions (Signed)
YOU can take the following: ? ?Tylenol 500 mg every 6 hrs as needed for pain and  ? ?Tizanidine 4 mg every 6hrs as needed and  ? ?Meloxicam 7.5 mg daily  ?

## 2021-03-17 DIAGNOSIS — I739 Peripheral vascular disease, unspecified: Secondary | ICD-10-CM | POA: Diagnosis not present

## 2021-03-17 DIAGNOSIS — M79675 Pain in left toe(s): Secondary | ICD-10-CM | POA: Diagnosis not present

## 2021-03-17 DIAGNOSIS — L110 Acquired keratosis follicularis: Secondary | ICD-10-CM | POA: Diagnosis not present

## 2021-03-17 DIAGNOSIS — M79674 Pain in right toe(s): Secondary | ICD-10-CM | POA: Diagnosis not present

## 2021-03-17 DIAGNOSIS — M79671 Pain in right foot: Secondary | ICD-10-CM | POA: Diagnosis not present

## 2021-03-17 DIAGNOSIS — M79672 Pain in left foot: Secondary | ICD-10-CM | POA: Diagnosis not present

## 2021-03-18 DIAGNOSIS — Z8500 Personal history of malignant neoplasm of unspecified digestive organ: Secondary | ICD-10-CM | POA: Diagnosis not present

## 2021-03-18 DIAGNOSIS — N1831 Chronic kidney disease, stage 3a: Secondary | ICD-10-CM | POA: Diagnosis not present

## 2021-03-29 ENCOUNTER — Other Ambulatory Visit: Payer: Self-pay

## 2021-03-29 ENCOUNTER — Ambulatory Visit: Payer: Medicare Other | Admitting: Orthopedic Surgery

## 2021-03-29 ENCOUNTER — Ambulatory Visit: Payer: Medicare Other

## 2021-03-29 DIAGNOSIS — M25552 Pain in left hip: Secondary | ICD-10-CM

## 2021-03-29 NOTE — Patient Instructions (Signed)

## 2021-04-13 NOTE — Progress Notes (Signed)
FOLLOW UP  ? ?Encounter Diagnosis  ?Name Primary?  ? Pain in left hip Yes  ? ? ? ?Chief Complaint  ?Patient presents with  ? Hip Pain  ?  Left, sharp pain, hurts at night  ? ? ? ?81 year old male with left hip pain ? ?Patient complains of left groin and hip pain nonradiating seems to occur with weightbearing no prior treatment ? ?His imaging shows a left femoral nailing no arthritis is seen in the joint femoral nail seems to be holding up well ? ?Examination revealed a slightly confused elderly 81 year old male he is slim and build he is awake and alert he is oriented to person and place ? ?His exam is consistent with tenderness over the greater trochanter of the left hip as well as normal range of motion of the left hip with no apparent joint pain ? ?He is ambulatory without assistive devices ? ?He was injected in his left greater trochanter ? ?No need for surgery no follow-up necessary ? ?Procedure note injection for left hip bursitis ? ?Verbal consent was obtained for injection of the  left hip  ? ?Timeout was completed to confirm the injection site ? ?The medications used were 40 mg of Depo-Medrol and 1% lidocaine 3 cc ? ?Anesthesia was provided by ethyl chloride and the skin was prepped with alcohol. ? ?After cleaning the skin with alcohol a 25-gauge needle was used to inject the left hip greater trochanteric bursa  ? ? ?

## 2021-06-16 ENCOUNTER — Telehealth: Payer: Self-pay | Admitting: *Deleted

## 2021-06-16 ENCOUNTER — Encounter: Payer: Self-pay | Admitting: Internal Medicine

## 2021-06-16 ENCOUNTER — Encounter: Payer: Self-pay | Admitting: *Deleted

## 2021-06-16 ENCOUNTER — Ambulatory Visit (INDEPENDENT_AMBULATORY_CARE_PROVIDER_SITE_OTHER): Payer: Medicare Other | Admitting: Internal Medicine

## 2021-06-16 VITALS — BP 111/68 | HR 90 | Temp 97.5°F | Ht 72.0 in | Wt 182.3 lb

## 2021-06-16 DIAGNOSIS — K6289 Other specified diseases of anus and rectum: Secondary | ICD-10-CM

## 2021-06-16 DIAGNOSIS — K644 Residual hemorrhoidal skin tags: Secondary | ICD-10-CM

## 2021-06-16 DIAGNOSIS — K5900 Constipation, unspecified: Secondary | ICD-10-CM | POA: Diagnosis not present

## 2021-06-16 DIAGNOSIS — R6889 Other general symptoms and signs: Secondary | ICD-10-CM

## 2021-06-16 MED ORDER — HYDROCORTISONE ACETATE 25 MG RE SUPP: 25.0000 mg | Freq: Two times a day (BID) | RECTAL | 1 refills | Status: DC | PRN

## 2021-06-16 MED ORDER — PEG 3350-KCL-NA BICARB-NACL 420 G PO SOLR: ORAL | 0 refills | Status: DC

## 2021-06-16 NOTE — Telephone Encounter (Signed)
Called pt. Spoke with spouse. Pt scheduled for TCS with Dr. Abbey Chatters, ASA 3 on 7/5 at 8:15am. Aware will mail prep instructions/pre-op appt. Will send prep rx.   PA done via [facility]. Auth# [id number], DOS: Jul 14, 2021 - Oct 12, 2021

## 2021-06-16 NOTE — Progress Notes (Signed)
Primary Care Physician:  Asencion Noble, MD Primary Gastroenterologist:  Dr. Abbey Chatters  Chief Complaint  Patient presents with   Hemorrhoids    Patient here today due to painful Hemorrhoids, possibly thrombosed. Patient denies constipation.     HPI:   Alex Lara. is a 81 y.o. male who presents to clinic today by referral from his PCP Dr. Willey Blade for evaluation.  Patient states he has had increase rectal pain.  Pain is constant, worse with bowel movements.  Moderate in severity.  Does not radiate.  States he feels it all the time.  No rectal bleeding.  No rectal itching.  Has taken Preparation H which is helped some.  Last colonoscopy many years ago.  Also notes constipation.  Previously had chronic diarrhea though this is changed in the past few months.  Now having trouble with his bowel movements.  Feels as though he has to have a bowel movement but just has gas, unable to completely empty rectum.  Past Medical History:  Diagnosis Date   Anxiety and depression    Arteriosclerotic cardiovascular disease (ASCVD)    06/2008: DES to the RCA and LAD   Blood clotting disorder (HCC)    Carpal tunnel syndrome    Chronic anticoagulation    Deep vein thrombosis (HCC)    Degenerative joint disease    Gastroesophageal reflux disease    Hyperlipidemia    Hypertension    Prostate cancer ([place])    Tobacco abuse     Past Surgical History:  Procedure Laterality Date   COLONOSCOPY  ?  Date   INGUINAL HERNIA REPAIR     ORIF-unknown fracture     PROSTATE BIOPSY     ROTATOR CUFF REPAIR     TOTAL HIP ARTHROPLASTY  2004   Left    Current Outpatient Medications  Medication Sig Dispense Refill   albuterol (VENTOLIN HFA) 108 (90 Base) MCG/ACT inhaler Inhale 2 puffs into the lungs every 4 (four) hours as needed.     atorvastatin (LIPITOR) 40 MG tablet Take 1 tablet (40 mg total) by mouth daily at 6 PM. 30 tablet 3   clonazePAM (KLONOPIN) 0.5 MG tablet Take 0.5 mg by mouth 3 (three) times daily  as needed. 1/2 am 1 whole at lunch 1/2 QHS     ibuprofen (ADVIL) 200 MG tablet Take 200 mg by mouth every 6 (six) hours as needed.     meloxicam (MOBIC) 7.5 MG tablet Take 1 tablet (7.5 mg total) by mouth daily. 30 tablet 5   nortriptyline (PAMELOR) 10 MG capsule Take 30 mg by mouth at bedtime.      tamsulosin (FLOMAX) 0.4 MG CAPS capsule Take 1 capsule (0.4 mg total) by mouth daily. 90 capsule 3   No current facility-administered medications for this visit.    Allergies as of 06/16/2021 - Review Complete 06/16/2021  Allergen Reaction Noted   Codeine Nausea And Vomiting and Other (See Comments) 11/05/2013    Family History  Problem Relation Age of Onset   Coronary artery disease Father    Coronary artery disease Mother        died post-CABG   Heart disease Mother    Breast cancer Maternal Aunt    Lung cancer Cousin    Melanoma Daughter    Prostate cancer Neg Hx     Social History   Socioeconomic History   Marital status: Married    Spouse name: Not on file   Number of children: 3  Years of education: 52   Highest education level: Not on file  Occupational History    Comment: retired  Tobacco Use   Smoking status: Every Day    Packs/day: 1.50    Years: 55.00    Pack years: 82.50    Types: Cigarettes   Smokeless tobacco: Never  Vaping Use   Vaping Use: Never used  Substance and Sexual Activity   Alcohol use: Yes    Alcohol/week: 0.0 standard drinks    Comment: Patient states he is cutting down. 2 drinks today. none yesterday. History of ETOH abuse   Drug use: No   Sexual activity: Not Currently  Other Topics Concern   Not on file  Social History Narrative   Not on file   Social Determinants of Health   Financial Resource Strain: Not on file  Food Insecurity: Not on file  Transportation Needs: Not on file  Physical Activity: Not on file  Stress: Not on file  Social Connections: Not on file  Intimate Partner Violence: Not on file    Subjective: Review  of Systems  Constitutional:  Negative for chills and fever.  HENT:  Negative for congestion and hearing loss.   Eyes:  Negative for blurred vision and double vision.  Respiratory:  Negative for cough and shortness of breath.   Cardiovascular:  Negative for chest pain and palpitations.  Gastrointestinal:  Positive for abdominal pain and constipation. Negative for diarrhea, heartburn, melena and vomiting.       Rectal pain  Genitourinary:  Negative for dysuria and urgency.  Musculoskeletal:  Negative for joint pain and myalgias.  Skin:  Negative for itching and rash.  Neurological:  Negative for dizziness and headaches.  Psychiatric/Behavioral:  Negative for depression. The patient is not nervous/anxious.       Objective: BP 111/68 (BP Location: Left Arm, Patient Position: Sitting, Cuff Size: Small)   Pulse 90   Temp (!) 97.5 F (36.4 C) (Oral)   Ht 6' (1.829 m)   Wt 182 lb 4.8 oz (82.7 kg)   BMI 24.72 kg/m  Physical Exam Constitutional:      Appearance: Normal appearance.  HENT:     Head: Normocephalic and atraumatic.  Eyes:     Extraocular Movements: Extraocular movements intact.     Conjunctiva/sclera: Conjunctivae normal.  Cardiovascular:     Rate and Rhythm: Normal rate and regular rhythm.  Pulmonary:     Effort: Pulmonary effort is normal.     Breath sounds: Normal breath sounds.  Abdominal:     General: Bowel sounds are normal.     Palpations: Abdomen is soft.  Musculoskeletal:        General: Normal range of motion.     Cervical back: Normal range of motion and neck supple.  Skin:    General: Skin is warm.  Neurological:     General: No focal deficit present.     Mental Status: He is alert and oriented to person, place, and time.  Psychiatric:        Mood and Affect: Mood normal.        Behavior: Behavior normal.  Rectal exam: 1 external hemorrhoid noted, nontender, nonthrombosed.  On digital rectal exam, very tender, soft tissue mass vs internal hemorrhoid  felt in 4-5 o'clock position.   Assessment: *Rectal pain *Constipation *Abnormal digital rectal exam *External hemorrhoid  Plan: Etiology of patient's symptoms unclear.  No obvious anal fissure identified.  Does have midline external hemorrhoid, nontender, nonthrombosed.  Tender on digital  rectal exam, 1 internal hemorrhoid versus soft tissue mass felt.  Will schedule for diagnostic colonoscopy.The risks including infection, bleed, or perforation as well as benefits, limitations, alternatives and imponderables have been reviewed with the patient. Questions have been answered. All parties agreeable.  For patient's constipation, I recommended taking MiraLAX 1 capful daily.  If this is not adequate then increase to 2 capfuls daily.  If this is still not adequate then add on once daily Dulcolax.  Also recommended patient start taking fiber therapy. Encouraged to drink at least 6 glasses of water daily.  Thank you Dr. Willey Blade for the kind referral.  06/16/2021 2:15 PM   Disclaimer: This note was dictated with voice recognition software. Similar sounding words can inadvertently be transcribed and may not be corrected upon review.

## 2021-06-16 NOTE — Patient Instructions (Signed)
We will schedule you for colonoscopy to further evaluate your rectal pain and constipation.  I will send in Anusol suppositories to take up to twice a day as needed for discomfort.  For your constipation, I want you to start taking over the counter MiraLAX 1 capful daily.  If this does not adequately control your constipation, I would increase to 2 capfuls daily.  If this is still not adequate, then I would add on once daily Dulcolax (bisacodyl) tablet.   I also recommend increasing fiber in your diet or adding OTC Benefiber/Metamucil. Be sure to drink at least 4 to 6 glasses of water daily.   Further recommendations to follow.  It was very nice meeting both you today.  Dr. Abbey Chatters  At [facility] we value your feedback. You may receive a survey about your visit today. Please share your experience as we strive to create trusting relationships with our patients to provide genuine, compassionate, quality care.  We appreciate your understanding and patience as we review any laboratory studies, imaging, and other diagnostic tests that are ordered as we care for you. Our office policy is 5 business days for review of these results, and any emergent or urgent results are addressed in a timely manner for your best interest. If you do not hear from our office in 1 week, please contact us.   We also encourage the use of MyChart, which contains your medical information for your review as well. If you are not enrolled in this feature, an access code is on this after visit summary for your convenience. Thank you for allowing [place] to be involved in your care.  It was great to see you today!  I hope you have a great rest of your Spring!    Alex Lara. Abbey Chatters, D.O. Gastroenterology and Hepatology [facility]

## 2021-06-17 ENCOUNTER — Telehealth: Payer: Self-pay

## 2021-06-17 NOTE — Telephone Encounter (Signed)
PA done on Cover My Meds for Anucort-HC 25 mg rectal supp. Qty # 12. K62.89 RECTAL PAIN, R68.89 ABN RECTAL EXAM K59.00 CONSTIPATION, K64.4 EXTERNAL HEMORRHOIDS. Waiting on response from Cover My Meds.

## 2021-06-17 NOTE — Telephone Encounter (Signed)
Dr. Abbey Chatters, Pt's insurance denied Anucort-Hc. Insurance states that this is less effective efficacy study implementation drugs. The cost of the medication for this pt is $170.00. this is a class 2 and the only thing listed in the tier 1 class is Proctozone-HC. Pt states he cannot afford this please send in something else

## 2021-06-23 ENCOUNTER — Telehealth: Payer: Self-pay | Admitting: Internal Medicine

## 2021-06-23 MED ORDER — HYDROCORTISONE (PERIANAL) 2.5 % EX CREA: 1.0000 "application " | TOPICAL_CREAM | Freq: Two times a day (BID) | CUTANEOUS | 1 refills | Status: DC

## 2021-06-23 NOTE — Telephone Encounter (Signed)
Anusol cream sent to pharmacy instead.  Thank you

## 2021-06-24 NOTE — Telephone Encounter (Signed)
Noted  

## 2021-06-28 ENCOUNTER — Telehealth: Payer: Self-pay

## 2021-06-28 NOTE — Telephone Encounter (Signed)
Called to advise.  

## 2021-06-28 NOTE — Telephone Encounter (Signed)
No, we do not treat chronic pain or prescribe opiods on a chronic basis, call your primary care doctor and get a referral to pan management (final answer)

## 2021-06-28 NOTE — Telephone Encounter (Signed)
Patient's wife called asking if Dr. Aline Brochure would do the patient a script for something for pain. What he gave him in March didn't really help all that much. I told her that he may have to be seen again before he can get anything for pain.  PATIENT USES [place] [address]

## 2021-07-07 NOTE — Patient Instructions (Signed)
   Your procedure is scheduled on: 07/14/2021  Report to [facility] Entr[facility] at   6:45  AM.  Call this number if you have problems the morning of surgery: [phone]   Remember:              Follow Directions on the letter you received from Your Physician's office regarding the Bowel Prep              No Smoking the day of Procedure :   Take these medicines the morning of surgery with A SIP OF WATER: Klonopin  Use inhalers if needed   Do not wear jewelry, make-up or nail polish.    Do not bring valuables to the hospital.  Contacts, dentures or bridgework may not be worn into surgery.  .   Patients discharged the day of surgery will not be allowed to drive home.     Colonoscopy, Adult, Care After This sheet gives you information about how to care for yourself after your procedure. Your health care provider may also give you more specific instructions. If you have problems or questions, contact your health care provider. What can I expect after the procedure? After the procedure, it is common to have: A small amount of blood in your stool for 24 hours after the procedure. Some gas. Mild abdominal cramping or bloating.  Follow these instructions at home: General instructions  For the first 24 hours after the procedure: Do not drive or use machinery. Do not sign important documents. Do not drink alcohol. Do your regular daily activities at a slower pace than normal. Eat soft, easy-to-digest foods. Rest often. Take over-the-counter or prescription medicines only as told by your health care provider. It is up to you to get the results of your procedure. Ask your health care provider, or the department performing the procedure, when your results will be ready. Relieving cramping and bloating Try walking around when you have cramps or feel bloated. Apply heat to your abdomen as told by your health care provider. Use a heat source that your health care provider recommends,  such as a moist heat pack or a heating pad. Place a towel between your skin and the heat source. Leave the heat on for 20-30 minutes. Remove the heat if your skin turns bright red. This is especially important if you are unable to feel pain, heat, or cold. You may have a greater risk of getting burned. Eating and drinking Drink enough fluid to keep your urine clear or pale yellow. Resume your normal diet as instructed by your health care provider. Avoid heavy or fried foods that are hard to digest. Avoid drinking alcohol for as long as instructed by your health care provider. Contact a health care provider if: You have blood in your stool 2-3 days after the procedure. Get help right away if: You have more than a small spotting of blood in your stool. You pass large blood clots in your stool. Your abdomen is swollen. You have nausea or vomiting. You have a fever. You have increasing abdominal pain that is not relieved with medicine. This information is not intended to replace advice given to you by your health care provider. Make sure you discuss any questions you have with your health care provider. Document Released: 08/11/2003 Document Revised: 09/21/2015 Document Reviewed: 03/10/2015 Elsevier Interactive Patient Education  [employer].

## 2021-07-09 DIAGNOSIS — I2510 Atherosclerotic heart disease of native coronary artery without angina pectoris: Secondary | ICD-10-CM | POA: Diagnosis not present

## 2021-07-09 DIAGNOSIS — K219 Gastro-esophageal reflux disease without esophagitis: Secondary | ICD-10-CM | POA: Diagnosis not present

## 2021-07-12 ENCOUNTER — Encounter (HOSPITAL_COMMUNITY)
Admission: RE | Admit: 2021-07-12 | Discharge: 2021-07-12 | Disposition: A | Discharge status: Home / Self Care | Payer: Medicare Other | Source: Ambulatory Visit | Attending: Internal Medicine | Admitting: Internal Medicine

## 2021-07-12 ENCOUNTER — Encounter (HOSPITAL_COMMUNITY): Payer: Self-pay

## 2021-07-12 ENCOUNTER — Telehealth: Payer: Self-pay

## 2021-07-12 VITALS — BP 107/72 | HR 74 | Resp 18 | Ht 72.0 in | Wt 182.0 lb

## 2021-07-12 DIAGNOSIS — Z01810 Encounter for preprocedural cardiovascular examination: Secondary | ICD-10-CM | POA: Insufficient documentation

## 2021-07-12 DIAGNOSIS — I10 Essential (primary) hypertension: Secondary | ICD-10-CM | POA: Insufficient documentation

## 2021-07-12 HISTORY — DX: Cerebral infarction, unspecified: I63.9

## 2021-07-12 NOTE — Telephone Encounter (Signed)
Pt / pt's wife phoned and asked could you change the pt's colonoscopy code from being a  diagnostic because it will cost them $350.00 up front. I didn't see where the pt has had one done. His procedure is scheduled for 07/14/2021. Please advise

## 2021-07-14 ENCOUNTER — Ambulatory Visit (HOSPITAL_COMMUNITY): Payer: Medicare Other | Admitting: Anesthesiology

## 2021-07-14 ENCOUNTER — Encounter (HOSPITAL_COMMUNITY)
Admission: RE | Disposition: A | Discharge status: Home / Self Care | Payer: Self-pay | Source: Home / Self Care | Attending: Internal Medicine

## 2021-07-14 ENCOUNTER — Ambulatory Visit (HOSPITAL_COMMUNITY)
Admission: RE | Admit: 2021-07-14 | Discharge: 2021-07-14 | Disposition: A | Discharge status: Home / Self Care | Payer: Medicare Other | Attending: Internal Medicine | Admitting: Internal Medicine

## 2021-07-14 ENCOUNTER — Ambulatory Visit (HOSPITAL_BASED_OUTPATIENT_CLINIC_OR_DEPARTMENT_OTHER): Payer: Medicare Other | Admitting: Anesthesiology

## 2021-07-14 DIAGNOSIS — E785 Hyperlipidemia, unspecified: Secondary | ICD-10-CM | POA: Diagnosis not present

## 2021-07-14 DIAGNOSIS — Z86718 Personal history of other venous thrombosis and embolism: Secondary | ICD-10-CM | POA: Diagnosis not present

## 2021-07-14 DIAGNOSIS — F418 Other specified anxiety disorders: Secondary | ICD-10-CM

## 2021-07-14 DIAGNOSIS — I639 Cerebral infarction, unspecified: Secondary | ICD-10-CM

## 2021-07-14 DIAGNOSIS — J449 Chronic obstructive pulmonary disease, unspecified: Secondary | ICD-10-CM | POA: Insufficient documentation

## 2021-07-14 DIAGNOSIS — N138 Other obstructive and reflux uropathy: Secondary | ICD-10-CM

## 2021-07-14 DIAGNOSIS — R2990 Unspecified symptoms and signs involving the nervous system: Secondary | ICD-10-CM

## 2021-07-14 DIAGNOSIS — Z1211 Encounter for screening for malignant neoplasm of colon: Secondary | ICD-10-CM | POA: Insufficient documentation

## 2021-07-14 DIAGNOSIS — F32A Depression, unspecified: Secondary | ICD-10-CM | POA: Insufficient documentation

## 2021-07-14 DIAGNOSIS — K635 Polyp of colon: Secondary | ICD-10-CM

## 2021-07-14 DIAGNOSIS — F419 Anxiety disorder, unspecified: Secondary | ICD-10-CM | POA: Diagnosis not present

## 2021-07-14 DIAGNOSIS — Z955 Presence of coronary angioplasty implant and graft: Secondary | ICD-10-CM | POA: Insufficient documentation

## 2021-07-14 DIAGNOSIS — K5730 Diverticulosis of large intestine without perforation or abscess without bleeding: Secondary | ICD-10-CM | POA: Insufficient documentation

## 2021-07-14 DIAGNOSIS — C61 Malignant neoplasm of prostate: Secondary | ICD-10-CM

## 2021-07-14 DIAGNOSIS — I82409 Acute embolism and thrombosis of unspecified deep veins of unspecified lower extremity: Secondary | ICD-10-CM

## 2021-07-14 DIAGNOSIS — K648 Other hemorrhoids: Secondary | ICD-10-CM | POA: Diagnosis not present

## 2021-07-14 DIAGNOSIS — I714 Abdominal aortic aneurysm, without rupture, unspecified: Secondary | ICD-10-CM | POA: Insufficient documentation

## 2021-07-14 DIAGNOSIS — D123 Benign neoplasm of transverse colon: Secondary | ICD-10-CM | POA: Diagnosis not present

## 2021-07-14 DIAGNOSIS — I10 Essential (primary) hypertension: Secondary | ICD-10-CM | POA: Diagnosis not present

## 2021-07-14 DIAGNOSIS — F17210 Nicotine dependence, cigarettes, uncomplicated: Secondary | ICD-10-CM | POA: Insufficient documentation

## 2021-07-14 DIAGNOSIS — K219 Gastro-esophageal reflux disease without esophagitis: Secondary | ICD-10-CM

## 2021-07-14 DIAGNOSIS — I2510 Atherosclerotic heart disease of native coronary artery without angina pectoris: Secondary | ICD-10-CM | POA: Diagnosis not present

## 2021-07-14 DIAGNOSIS — N5201 Erectile dysfunction due to arterial insufficiency: Secondary | ICD-10-CM

## 2021-07-14 DIAGNOSIS — Z79899 Other long term (current) drug therapy: Secondary | ICD-10-CM | POA: Diagnosis not present

## 2021-07-14 DIAGNOSIS — M1990 Unspecified osteoarthritis, unspecified site: Secondary | ICD-10-CM | POA: Insufficient documentation

## 2021-07-14 DIAGNOSIS — R0609 Other forms of dyspnea: Secondary | ICD-10-CM

## 2021-07-14 DIAGNOSIS — Z8673 Personal history of transient ischemic attack (TIA), and cerebral infarction without residual deficits: Secondary | ICD-10-CM | POA: Insufficient documentation

## 2021-07-14 DIAGNOSIS — R3912 Poor urinary stream: Secondary | ICD-10-CM

## 2021-07-14 DIAGNOSIS — R0789 Other chest pain: Secondary | ICD-10-CM

## 2021-07-14 DIAGNOSIS — Z8546 Personal history of malignant neoplasm of prostate: Secondary | ICD-10-CM | POA: Insufficient documentation

## 2021-07-14 DIAGNOSIS — Z720 Tobacco use: Secondary | ICD-10-CM

## 2021-07-14 HISTORY — PX: POLYPECTOMY: SHX5525

## 2021-07-14 HISTORY — PX: COLONOSCOPY WITH PROPOFOL: SHX5780

## 2021-07-14 SURGERY — COLONOSCOPY WITH PROPOFOL
Anesthesia: General

## 2021-07-14 MED ORDER — EPHEDRINE 5 MG/ML INJ
INTRAVENOUS | Status: AC
  Filled 2021-07-14: qty 5

## 2021-07-14 MED ORDER — PHENYLEPHRINE 80 MCG/ML (10ML) SYRINGE FOR IV PUSH (FOR BLOOD PRESSURE SUPPORT)
PREFILLED_SYRINGE | INTRAVENOUS | Status: AC
  Filled 2021-07-14: qty 10

## 2021-07-14 MED ORDER — PROPOFOL 10 MG/ML IV BOLUS
INTRAVENOUS | Status: DC | PRN
  Administered 2021-07-14: 100 mg via INTRAVENOUS
  Administered 2021-07-14: 50 mg via INTRAVENOUS

## 2021-07-14 MED ORDER — LIDOCAINE HCL (CARDIAC) PF 100 MG/5ML IV SOSY
PREFILLED_SYRINGE | INTRAVENOUS | Status: DC | PRN
  Administered 2021-07-14: 50 mg via INTRAVENOUS

## 2021-07-14 MED ORDER — EPHEDRINE SULFATE-NACL 50-0.9 MG/10ML-% IV SOSY
PREFILLED_SYRINGE | INTRAVENOUS | Status: DC | PRN
  Administered 2021-07-14 (×2): 5 mg via INTRAVENOUS

## 2021-07-14 MED ORDER — LACTATED RINGERS IV SOLN
INTRAVENOUS | Status: DC
Start: 2021-07-14 — End: 2021-07-14

## 2021-07-14 MED ORDER — PHENYLEPHRINE 80 MCG/ML (10ML) SYRINGE FOR IV PUSH (FOR BLOOD PRESSURE SUPPORT)
PREFILLED_SYRINGE | INTRAVENOUS | Status: DC | PRN
  Administered 2021-07-14: 80 ug via INTRAVENOUS

## 2021-07-14 NOTE — Telephone Encounter (Signed)
Noted. Thanks.

## 2021-07-14 NOTE — Telephone Encounter (Signed)
Mindy  I had already sent this to Dr Abbey Chatters and the wife is calling again but pt already had this done this morning. I advised her we had to follow the what the Dr's ov states which is diagnostic. Just a [place]

## 2021-07-14 NOTE — Transfer of Care (Signed)
Immediate Anesthesia Transfer of Care Note  Patient: Alex Lara.  Procedure(s) Performed: COLONOSCOPY WITH PROPOFOL POLYPECTOMY  Patient Location: Short Stay  Anesthesia Type:General  Level of Consciousness: awake  Airway & Oxygen Therapy: Patient Spontanous Breathing  Post-op Assessment: Report given to RN and Post -op Vital signs reviewed and stable  Post vital signs: Reviewed and stable  Last Vitals:  Vitals Value Taken Time  BP    Temp    Pulse    Resp    SpO2      Last Pain: There were no vitals filed for this visit.       Complications: No notable events documented.

## 2021-07-14 NOTE — Anesthesia Postprocedure Evaluation (Signed)
Anesthesia Post Note  Patient: Alex Lara.  Procedure(s) Performed: COLONOSCOPY WITH PROPOFOL POLYPECTOMY  Patient location during evaluation: Phase II Anesthesia Type: General Level of consciousness: awake and alert and oriented Pain management: pain level controlled Vital Signs Assessment: post-procedure vital signs reviewed and stable Respiratory status: spontaneous breathing, nonlabored ventilation and respiratory function stable Cardiovascular status: blood pressure returned to baseline and stable Postop Assessment: no apparent nausea or vomiting Anesthetic complications: no   No notable events documented.   Last Vitals:  Vitals:   07/14/21 0841  BP: 101/65  Pulse: 63  Resp: (!) 22  Temp: (!) 36.3 C  SpO2: 97%    Last Pain:  Vitals:   07/14/21 0841  TempSrc: Oral  PainSc: 0-No pain                 Giovonnie Trettel C Arling Cerone

## 2021-07-14 NOTE — Discharge Instructions (Signed)
  Colonoscopy Discharge Instructions  Read the instructions outlined below and refer to this sheet in the next few weeks. These discharge instructions provide you with general information on caring for yourself after you leave the hospital. Your doctor may also give you specific instructions. While your treatment has been planned according to the most current medical practices available, unavoidable complications occasionally occur.   ACTIVITY You may resume your regular activity, but move at a slower pace for the next 24 hours.  Take frequent rest periods for the next 24 hours.  Walking will help get rid of the air and reduce the bloated feeling in your belly (abdomen).  No driving for 24 hours (because of the medicine (anesthesia) used during the test).   Do not sign any important legal documents or operate any machinery for 24 hours (because of the anesthesia used during the test).  NUTRITION Drink plenty of fluids.  You may resume your normal diet as instructed by your doctor.  Begin with a light meal and progress to your normal diet. Heavy or fried foods are harder to digest and may make you feel sick to your stomach (nauseated).  Avoid alcoholic beverages for 24 hours or as instructed.  MEDICATIONS You may resume your normal medications unless your doctor tells you otherwise.  WHAT YOU CAN EXPECT TODAY Some feelings of bloating in the abdomen.  Passage of more gas than usual.  Spotting of blood in your stool or on the toilet paper.  IF YOU HAD POLYPS REMOVED DURING THE COLONOSCOPY: No aspirin products for 7 days or as instructed.  No alcohol for 7 days or as instructed.  Eat a soft diet for the next 24 hours.  FINDING OUT THE RESULTS OF YOUR TEST Not all test results are available during your visit. If your test results are not back during the visit, make an appointment with your caregiver to find out the results. Do not assume everything is normal if you have not heard from your  caregiver or the medical facility. It is important for you to follow up on all of your test results.  SEEK IMMEDIATE MEDICAL ATTENTION IF: You have more than a spotting of blood in your stool.  Your belly is swollen (abdominal distention).  You are nauseated or vomiting.  You have a temperature over 101.  You have abdominal pain or discomfort that is severe or gets worse throughout the day.   Your colonoscopy revealed 1 polyp(s) which I removed successfully. Await pathology results, my office will contact you. I recommend repeating colonoscopy in 5 years for surveillance purposes.   You also have diverticulosis and internal hemorrhoids. I would recommend increasing fiber in your diet or adding OTC Benefiber/Metamucil. Be sure to drink at least 4 to 6 glasses of water daily.   For your constipation, I want you to start taking over the counter MiraLAX 1 capful daily.  If this does not adequately control your constipation, I would increase to 2 capfuls daily.  If this is still not adequate, then I would add on once daily Dulcolax (bisacodyl) tablet.    Follow-up with GI as needed.  I hope you have a great rest of your week!  Elon Alas. Abbey Chatters, D.O. Gastroenterology and Hepatology [facility]

## 2021-07-14 NOTE — Anesthesia Procedure Notes (Signed)
Date/Time: 07/14/2021 8:25 AM  Performed by: Orlie Dakin, CRNAPre-anesthesia Checklist: Patient identified, Emergency Drugs available, Suction available and Patient being monitored Patient Re-evaluated:Patient Re-evaluated prior to induction Oxygen Delivery Method: Nasal cannula Induction Type: IV induction Placement Confirmation: positive ETCO2

## 2021-07-14 NOTE — H&P (Signed)
Primary Care Physician:  Asencion Noble, MD Primary Gastroenterologist:  Dr. Abbey Chatters  Pre-Procedure History & Physical: HPI:  Alex Mcchristian. is a 81 y.o. male is here for a colonoscopy to be performed for colon cancer screening purposes. Does have hemorrhoids in the past.   Past Medical History:  Diagnosis Date   Anxiety and depression    Arteriosclerotic cardiovascular disease (ASCVD)    06/2008: DES to the RCA and LAD   Blood clotting disorder (HCC)    Carpal tunnel syndrome    Chronic anticoagulation    Deep vein thrombosis (HCC)    Degenerative joint disease    Gastroesophageal reflux disease    Hyperlipidemia    Hypertension    Prostate cancer ([place])    Stroke ([place])    Tobacco abuse     Past Surgical History:  Procedure Laterality Date   COLONOSCOPY  ?  Date   INGUINAL HERNIA REPAIR     ORIF-unknown fracture     PROSTATE BIOPSY     ROTATOR CUFF REPAIR     TOTAL HIP ARTHROPLASTY  2004   Left    Prior to Admission medications   Medication Sig Start Date End Date Taking? Authorizing Provider  albuterol (VENTOLIN HFA) 108 (90 Base) MCG/ACT inhaler Inhale 2 puffs into the lungs every 4 (four) hours as needed for wheezing or shortness of breath. 05/30/21  Yes [provider]  atorvastatin (LIPITOR) 40 MG tablet Take 1 tablet (40 mg total) by mouth daily at 6 PM. Patient taking differently: Take 40 mg by mouth at bedtime. 01/31/19  Yes Barton Dubois, MD  b complex vitamins capsule Take 1 capsule by mouth daily.   Yes [provider]  cholecalciferol (VITAMIN D3) 25 MCG (1000 UNIT) tablet Take 1,000 Units by mouth daily.   Yes [provider]  clonazePAM (KLONOPIN) 0.5 MG tablet Take 0.5 mg by mouth 3 (three) times daily as needed for anxiety. 05/19/21  Yes [provider]  fluticasone (FLONASE) 50 MCG/ACT nasal spray Place 1 spray into both nostrils daily as needed for allergies or rhinitis.   Yes [provider]  hydrocortisone  (ANUSOL-HC) 2.5 % rectal cream Place 1 application  rectally 2 (two) times daily. 06/23/21  Yes Leatta Alewine K, DO  ibuprofen (ADVIL) 200 MG tablet Take 800 mg by mouth every 8 (eight) hours as needed for moderate pain.   Yes [provider]  losartan (COZAAR) 50 MG tablet Take 50 mg by mouth at bedtime. 04/25/21  Yes [provider]  melatonin 5 MG TABS Take 20 mg by mouth at bedtime.   Yes [provider]  vitamin C (ASCORBIC ACID) 500 MG tablet Take 500 mg by mouth daily.   Yes [provider]  aspirin EC 325 MG tablet Take 325 mg by mouth daily.    [provider]  meloxicam (MOBIC) 7.5 MG tablet Take 1 tablet (7.5 mg total) by mouth daily. Patient not taking: Reported on 07/02/2021 03/15/21   Carole Civil, MD  polyethylene glycol-electrolytes (NULYTELY) 420 g solution As directed 06/16/21   Eloise Harman, DO    Allergies as of 06/16/2021 - Review Complete 06/16/2021  Allergen Reaction Noted   Codeine Nausea And Vomiting and Other (See Comments) 11/05/2013    Family History  Problem Relation Age of Onset   Coronary artery disease Father    Coronary artery disease Mother        died post-CABG   Heart disease Mother  Breast cancer Maternal Aunt    Lung cancer Cousin    Melanoma Daughter    Prostate cancer Neg Hx     Social History   Socioeconomic History   Marital status: Married    Spouse name: Not on file   Number of children: 3   Years of education: 12   Highest education level: Not on file  Occupational History    Comment: retired  Tobacco Use   Smoking status: Every Day    Packs/day: 1.50    Years: 55.00    Total pack years: 82.50    Types: Cigarettes   Smokeless tobacco: Never  Vaping Use   Vaping Use: Never used  Substance and Sexual Activity   Alcohol use: Yes    Alcohol/week: 0.0 standard drinks of alcohol    Comment: Patient states he is cutting down. 2 drinks today. none yesterday. History of ETOH  abuse   Drug use: No   Sexual activity: Not Currently  Other Topics Concern   Not on file  Social History Narrative   Not on file   Social Determinants of Health   Financial Resource Strain: Not on file  Food Insecurity: Not on file  Transportation Needs: Not on file  Physical Activity: Not on file  Stress: Not on file  Social Connections: Not on file  Intimate Partner Violence: Not on file    Review of Systems: See HPI, otherwise negative ROS  Physical Exam: Vital signs in last 24 hours:     General:   Alert,  Well-developed, well-nourished, pleasant and cooperative in NAD Head:  Normocephalic and atraumatic. Eyes:  Sclera clear, no icterus.   Conjunctiva pink. Ears:  Normal auditory acuity. Nose:  No deformity, discharge,  or lesions. Mouth:  No deformity or lesions, dentition normal. Neck:  Supple; no masses or thyromegaly. Lungs:  Clear throughout to auscultation.   No wheezes, crackles, or rhonchi. No acute distress. Heart:  Regular rate and rhythm; no murmurs, clicks, rubs,  or gallops. Abdomen:  Soft, nontender and nondistended. No masses, hepatosplenomegaly or hernias noted. Normal bowel sounds, without guarding, and without rebound.   Msk:  Symmetrical without gross deformities. Normal posture. Extremities:  Without clubbing or edema. Neurologic:  Alert and  oriented x4;  grossly normal neurologically. Skin:  Intact without significant lesions or rashes. Cervical Nodes:  No significant cervical adenopathy. Psych:  Alert and cooperative. Normal mood and affect.  Impression/Plan: Alex Mends. is here for a colonoscopy to be performed for colon cancer screening purposes. Does have hemorrhoids in the past.   The risks of the procedure including infection, bleed, or perforation as well as benefits, limitations, alternatives and imponderables have been reviewed with the patient. Questions have been answered. All parties agreeable.

## 2021-07-14 NOTE — Op Note (Signed)
[facility] Patient Name: Alex Lara Procedure Date: 07/14/2021 8:11 AM MRN: [id number] Date of Birth: 1940/07/16 Attending MD: Elon Alas. Edgar Frisk CSN: [id number] Age: 81 Admit Type: Outpatient Procedure:                Colonoscopy Indications:              Screening for colorectal malignant neoplasm Providers:                Elon Alas. Abbey Chatters, DO, Caprice Kluver, Hendricks Limes., Technician Referring MD:              Medicines:                See the Anesthesia note for documentation of the                            administered medications Complications:            No immediate complications. Estimated Blood Loss:     Estimated blood loss was minimal. Procedure:                Pre-Anesthesia Assessment:                           - The anesthesia plan was to use monitored                            anesthesia care (MAC).                           After obtaining informed consent, the colonoscope                            was passed under direct vision. Throughout the                            procedure, the patient's blood pressure, pulse, and                            oxygen saturations were monitored continuously. The                            PCF-HQ190L ([id number]) scope was introduced through                            the anus and advanced to the the cecum, identified                            by appendiceal orifice and ileocecal valve. The                            colonoscopy was performed without difficulty. The                            patient tolerated the procedure  well. The quality                            of the bowel preparation was evaluated using the                            BBPS [facility] Bowel Preparation Scale) with scores                            of: Right Colon = 3, Transverse Colon = 3 and Left                            Colon = 3 (entire mucosa seen well with no residual                             staining, small fragments of stool or opaque                            liquid). The total BBPS score equals 9. Scope In: 8:24:00 AM Scope Out: 8:38:00 AM Scope Withdrawal Time: 0 hours 10 minutes 37 seconds  Total Procedure Duration: 0 hours 14 minutes 0 seconds  Findings:      Hemorrhoids were found on perianal exam.      Non-bleeding internal hemorrhoids were found during retroflexion.      Multiple small and large-mouthed diverticula were found in the sigmoid       colon.      A 5 mm polyp was found in the transverse colon. The polyp was sessile.       The polyp was removed with a cold snare. Resection and retrieval were       complete.      The exam was otherwise without abnormality. Impression:               - Hemorrhoids found on perianal exam.                           - Non-bleeding internal hemorrhoids.                           - Diverticulosis in the sigmoid colon.                           - One 5 mm polyp in the transverse colon, removed                            with a cold snare. Resected and retrieved.                           - The examination was otherwise normal. Moderate Sedation:      Per Anesthesia Care Recommendation:           - Patient has a contact number available for                            emergencies. The signs and symptoms of potential  delayed complications were discussed with the                            patient. Return to normal activities tomorrow.                            Written discharge instructions were provided to the                            patient.                           - Resume previous diet.                           - Continue present medications.                           - Await pathology results.                           - Repeat colonoscopy in 5-10 years for surveillance.                           - Return to GI clinic PRN. Procedure Code(s):        --- Professional ---                            [phone], Colonoscopy, flexible; with removal of                            tumor(s), polyp(s), or other lesion(s) by snare                            technique Diagnosis Code(s):        --- Professional ---                           Z12.11, Encounter for screening for malignant                            neoplasm of colon                           K64.8, Other hemorrhoids                           K63.5, Polyp of colon                           K57.30, Diverticulosis of large intestine without                            perforation or abscess without bleeding CPT copyright 2019 American Medical Association. All rights reserved. The codes documented in this report are preliminary and upon coder review may  be revised to meet current compliance requirements. Elon Alas. Abbey Chatters, DO [name], DO 07/14/2021  8:40:12 AM This report has been signed electronically. Number of Addenda: 0

## 2021-07-14 NOTE — Telephone Encounter (Signed)
I billed it as screening since it has been over 10 years

## 2021-07-14 NOTE — Anesthesia Preprocedure Evaluation (Signed)
Anesthesia Evaluation  Patient identified by MRN, date of birth, ID band Patient awake    Reviewed: Allergy & Precautions, NPO status , Patient's Chart, lab work & pertinent test results  Airway Mallampati: III  TM Distance: <3 FB Neck ROM: Full    Dental  (+) Edentulous Upper, Edentulous Lower   Pulmonary Current Smoker and Patient abstained from smoking.,    Pulmonary exam normal breath sounds clear to auscultation       Cardiovascular Exercise Tolerance: Good hypertension, Pt. on medications + CAD, + Cardiac Stents (one or two stents 2010) and + DVT  Normal cardiovascular exam Rhythm:Regular Rate:Normal     Neuro/Psych PSYCHIATRIC DISORDERS Anxiety Depression  Neuromuscular disease CVA, Residual Symptoms    GI/Hepatic Neg liver ROS, GERD  Controlled,  Endo/Other  negative endocrine ROS  Renal/GU negative Renal ROS Bladder dysfunction (prostate cancer)      Musculoskeletal  (+) Arthritis , Osteoarthritis,    Abdominal   Peds negative pediatric ROS (+)  Hematology negative hematology ROS (+)   Anesthesia Other Findings Cardiology note from 2018 05/2016 Lexiscan MPI ? Blood pressure demonstrated a normal response to exercise. ? Changed from exercise to pharmacological stress test due to leg pains and SOB ? Horizontal ST segment depression ST segment depression of 1 mm was noted after lexiscan injection in the II, III, aVF, V4, V5 and V6 leads. ? 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 ? This is an intermediate risk study. ? The left ventricular ejection fraction is mildly decreased (45-54%).    Assessment and Plan  1. CAD - recent nuclear stress test with mild to moderate inschemia. - currently asymptomatic, we are continuing medical therapy. If signficant symptoms would consider cath.   2. COPD - management per pcp  3. AAA -  contniue serial imaging, currently just mild aneurysm  4. Hyperlipidemia - intolerant to statins, he is overall resistant to most medications. Not interested in trying alternative    F/u 3 months  Reproductive/Obstetrics negative OB ROS                             Anesthesia Physical Anesthesia Plan  ASA: 3  Anesthesia Plan: General   Post-op Pain Management: Minimal or no pain anticipated   Induction: Intravenous  PONV Risk Score and Plan: Propofol infusion  Airway Management Planned: Nasal Cannula and Natural Airway  Additional Equipment:   Intra-op Plan:   Post-operative Plan:   Informed Consent: I have reviewed the patients History and Physical, chart, labs and discussed the procedure including the risks, benefits and alternatives for the proposed anesthesia with the patient or authorized representative who has indicated his/her understanding and acceptance.     Dental advisory given  Plan Discussed with: CRNA and Surgeon  Anesthesia Plan Comments:         Anesthesia Quick Evaluation

## 2021-07-15 LAB — SURGICAL PATHOLOGY

## 2021-07-20 ENCOUNTER — Encounter (HOSPITAL_COMMUNITY): Payer: Self-pay | Admitting: Internal Medicine

## 2021-07-20 DIAGNOSIS — Z79899 Other long term (current) drug therapy: Secondary | ICD-10-CM | POA: Diagnosis not present

## 2021-07-20 DIAGNOSIS — N1831 Chronic kidney disease, stage 3a: Secondary | ICD-10-CM | POA: Diagnosis not present

## 2021-07-27 DIAGNOSIS — R6889 Other general symptoms and signs: Secondary | ICD-10-CM | POA: Diagnosis not present

## 2021-07-28 ENCOUNTER — Ambulatory Visit (HOSPITAL_COMMUNITY)
Admission: RE | Admit: 2021-07-28 | Discharge: 2021-07-28 | Disposition: A | Discharge status: Home / Self Care | Payer: Medicare Other | Source: Ambulatory Visit | Attending: Internal Medicine | Admitting: Internal Medicine

## 2021-07-28 ENCOUNTER — Other Ambulatory Visit (HOSPITAL_COMMUNITY): Payer: Self-pay | Admitting: Internal Medicine

## 2021-07-28 DIAGNOSIS — E785 Hyperlipidemia, unspecified: Secondary | ICD-10-CM | POA: Diagnosis not present

## 2021-07-28 DIAGNOSIS — R079 Chest pain, unspecified: Secondary | ICD-10-CM

## 2021-07-28 DIAGNOSIS — M549 Dorsalgia, unspecified: Secondary | ICD-10-CM | POA: Diagnosis not present

## 2021-07-28 DIAGNOSIS — M40204 Unspecified kyphosis, thoracic region: Secondary | ICD-10-CM | POA: Diagnosis not present

## 2021-07-28 DIAGNOSIS — M5136 Other intervertebral disc degeneration, lumbar region: Secondary | ICD-10-CM | POA: Diagnosis not present

## 2021-07-28 DIAGNOSIS — N1831 Chronic kidney disease, stage 3a: Secondary | ICD-10-CM | POA: Diagnosis not present

## 2021-07-28 DIAGNOSIS — R7309 Other abnormal glucose: Secondary | ICD-10-CM | POA: Diagnosis not present

## 2021-07-28 DIAGNOSIS — M546 Pain in thoracic spine: Secondary | ICD-10-CM | POA: Diagnosis not present

## 2021-07-28 DIAGNOSIS — M545 Low back pain, unspecified: Secondary | ICD-10-CM | POA: Diagnosis not present

## 2021-07-28 DIAGNOSIS — R531 Weakness: Secondary | ICD-10-CM | POA: Diagnosis not present

## 2021-08-10 ENCOUNTER — Ambulatory Visit (INDEPENDENT_AMBULATORY_CARE_PROVIDER_SITE_OTHER): Payer: Medicare Other | Admitting: Gastroenterology

## 2021-08-10 ENCOUNTER — Encounter: Payer: Self-pay | Admitting: Gastroenterology

## 2021-08-10 ENCOUNTER — Ambulatory Visit: Payer: Medicare Other | Admitting: Gastroenterology

## 2021-08-10 VITALS — BP 108/62 | HR 64 | Temp 97.4°F | Ht 72.0 in | Wt 176.0 lb

## 2021-08-10 DIAGNOSIS — K6289 Other specified diseases of anus and rectum: Secondary | ICD-10-CM | POA: Diagnosis not present

## 2021-08-10 NOTE — Patient Instructions (Signed)
I recommend 2 teaspoons of Benefiber every day mixed in the beverage of your choice. Do not take pepto bismol or the milk of magnesia right now.  I have called in a special compounded cream to [place] to use 4 times a day per rectum. Make sure to wear gloves when applying this. It can cause dizziness and headache, so wash hands thoroughly thereafter. Let us know how this works for you in a few weeks!  We will see you in 4-6 weeks!  It was a pleasure to see you today. I want to create trusting relationships with patients to provide genuine, compassionate, and quality care. I value your feedback. If you receive a survey regarding your visit,  I greatly appreciate you taking time to fill this out.   Annitta Needs, PhD, ANP-BC [facility] Gastroenterology

## 2021-08-10 NOTE — Progress Notes (Signed)
Gastroenterology Office Note     Primary Care Physician:  Asencion Noble, MD  Primary Gastroenterologist: Dr. Abbey Chatters   Chief Complaint   Chief Complaint  Patient presents with   Follow-up    Pt here to discuss banding with Vicente Males     History of Present Illness   Alex Harold. is an 81 y.o. male presenting today in follow-up with a history of rectal bleeding and pain. Underwent colonoscopy in interim with non-bleeding internal hemorrhoids, sigmoid diverticulosis, one 5 mm polyp s/p removal. Sessile serrated adenoma. 5 year surveillance. Wife is present with him today. Difficult historian.    Takes pepto bismol when runny stool and then milk of magnesia. Will strain with BMs. Has alternating constipation with diarrhea.   Notes rectal pain with BMs and sometimes without even any precipitating factors. No rectal bleeding or prolapse. No itching or soiling. He describes as knife-like.   Past Medical History:  Diagnosis Date   Anxiety and depression    Arteriosclerotic cardiovascular disease (ASCVD)    06/2008: DES to the RCA and LAD   Blood clotting disorder (HCC)    Carpal tunnel syndrome    Chronic anticoagulation    Deep vein thrombosis (HCC)    Degenerative joint disease    Gastroesophageal reflux disease    Hyperlipidemia    Hypertension    Prostate cancer ([place])    Stroke ([place])    Tobacco abuse     Past Surgical History:  Procedure Laterality Date   COLONOSCOPY  ?  Date   COLONOSCOPY WITH PROPOFOL N/A 07/14/2021   Procedure: COLONOSCOPY WITH PROPOFOL;  Surgeon: Eloise Harman, DO;  [facility];  Service: Endoscopy;  Laterality: N/A;  8:15am, asa 3   INGUINAL HERNIA REPAIR     ORIF-unknown fracture     POLYPECTOMY  07/14/2021   Procedure: POLYPECTOMY;  Surgeon: Eloise Harman, DO;  Location: [facility];  Service: Endoscopy;;   PROSTATE BIOPSY     ROTATOR CUFF REPAIR     TOTAL HIP ARTHROPLASTY  2004   Left    Current Outpatient Medications   Medication Sig Dispense Refill   albuterol (VENTOLIN HFA) 108 (90 Base) MCG/ACT inhaler Inhale 2 puffs into the lungs every 4 (four) hours as needed for wheezing or shortness of breath.     aspirin EC 325 MG tablet Take 325 mg by mouth daily.     atorvastatin (LIPITOR) 40 MG tablet Take 1 tablet (40 mg total) by mouth daily at 6 PM. (Patient taking differently: Take 40 mg by mouth at bedtime.) 30 tablet 3   b complex vitamins capsule Take 1 capsule by mouth daily.     cholecalciferol (VITAMIN D3) 25 MCG (1000 UNIT) tablet Take 1,000 Units by mouth daily.     clonazePAM (KLONOPIN) 0.5 MG tablet Take 0.5 mg by mouth 3 (three) times daily as needed for anxiety.     ibuprofen (ADVIL) 200 MG tablet Take 800 mg by mouth every 8 (eight) hours as needed for moderate pain.     losartan (COZAAR) 50 MG tablet Take 50 mg by mouth at bedtime.     melatonin 5 MG TABS Take 20 mg by mouth at bedtime.     traMADol (ULTRAM) 50 MG tablet Take 50 mg by mouth 2 (two) times daily.     fluticasone (FLONASE) 50 MCG/ACT nasal spray Place 1 spray into both nostrils daily as needed for allergies or rhinitis. (Patient not taking: Reported on 08/10/2021)  hydrocortisone (ANUSOL-HC) 2.5 % rectal cream Place 1 application  rectally 2 (two) times daily. (Patient not taking: Reported on 08/10/2021) 30 g 1   meloxicam (MOBIC) 7.5 MG tablet Take 1 tablet (7.5 mg total) by mouth daily. (Patient not taking: Reported on 07/02/2021) 30 tablet 5   vitamin C (ASCORBIC ACID) 500 MG tablet Take 500 mg by mouth daily. (Patient not taking: Reported on 08/10/2021)     No current facility-administered medications for this visit.    Allergies as of 08/10/2021 - Review Complete 08/10/2021  Allergen Reaction Noted   Codeine Nausea And Vomiting and Other (See Comments) 11/05/2013    Family History  Problem Relation Age of Onset   Coronary artery disease Father    Coronary artery disease Mother        died post-CABG   Heart disease Mother     Breast cancer Maternal Aunt    Lung cancer Cousin    Melanoma Daughter    Prostate cancer Neg Hx     Social History   Socioeconomic History   Marital status: Married    Spouse name: Not on file   Number of children: 3   Years of education: 12   Highest education level: Not on file  Occupational History    Comment: retired  Tobacco Use   Smoking status: Every Day    Packs/day: 1.50    Years: 55.00    Total pack years: 82.50    Types: Cigarettes   Smokeless tobacco: Never  Vaping Use   Vaping Use: Never used  Substance and Sexual Activity   Alcohol use: Yes    Alcohol/week: 0.0 standard drinks of alcohol    Comment: Patient states he is cutting down. 2 drinks today. none yesterday. History of ETOH abuse   Drug use: No   Sexual activity: Not Currently  Other Topics Concern   Not on file  Social History Narrative   Not on file   Social Determinants of Health   Financial Resource Strain: Not on file  Food Insecurity: Not on file  Transportation Needs: Not on file  Physical Activity: Not on file  Stress: Not on file  Social Connections: Not on file  Intimate Partner Violence: Not on file     Review of Systems   Gen: Denies any fever, chills, fatigue, weight loss, lack of appetite.  CV: Denies chest pain, heart palpitations, peripheral edema, syncope.  Resp: Denies shortness of breath at rest or with exertion. Denies wheezing or cough.  GI: see HPI GU : Denies urinary burning, urinary frequency, urinary hesitancy MS: Denies joint pain, muscle weakness, cramps, or limitation of movement.  Derm: Denies rash, itching, dry skin Psych: Denies depression, anxiety, memory loss, and confusion Heme: Denies bruising, bleeding, and enlarged lymph nodes.   Physical Exam   BP 108/62   Pulse 64   Temp (!) 97.4 F (36.3 C)   Ht 6' (1.829 m)   Wt 176 lb (79.8 kg)   BMI 23.87 kg/m  General:   Alert and oriented. Pleasant and cooperative. Well-nourished and  well-developed.  Head:  Normocephalic and atraumatic. Eyes:  Without icterus Rectal:  external hemorrhoid tag. DRE with significant discomfort.  Msk:  Symmetrical without gross deformities. Normal posture. Extremities:  Without edema. Neurologic:  Alert and  oriented x4;  grossly normal neurologically. Skin:  Intact without significant lesions or rashes. Psych:  Alert and cooperative. Normal mood and affect.   Assessment   Alex Mends. is an 81  y.o. male presenting today in follow-up with a history of 81 y.o. male presenting today in follow-up with a history of rectal bleeding and pain. Underwent colonoscopy in interim with non-bleeding internal hemorrhoids, sigmoid diverticulosis, one 5 mm polyp s/p removal. Sessile serrated adenoma. 5 year surveillance. Wife is present with him today.  Symptoms seem more consistent with anal fissure. DRE with notable discomfort.  I will have him take Benefiber daily. Adding [facility] hemorrhoid cream with Nitro to use QID for next 2-3 weeks.   May not need banding as his symptoms seem predominantly unrelated to hemorrhoids.   PLAN  [facility] hemorrhoid cream with Nitro sent to [facility] daily Return for close follow-up in 4 weeks   Annitta Needs, PhD, [facility] Gastroenterology

## 2021-08-16 ENCOUNTER — Encounter (HOSPITAL_COMMUNITY): Payer: Self-pay

## 2021-08-16 ENCOUNTER — Emergency Department (HOSPITAL_COMMUNITY): Payer: Medicare Other

## 2021-08-16 ENCOUNTER — Other Ambulatory Visit: Payer: Self-pay

## 2021-08-16 ENCOUNTER — Observation Stay (HOSPITAL_COMMUNITY): Payer: Medicare Other

## 2021-08-16 ENCOUNTER — Inpatient Hospital Stay (HOSPITAL_COMMUNITY)
Admission: EM | Admit: 2021-08-16 | Discharge: 2021-08-19 | DRG: 315 | Disposition: A | Discharge status: Home / Self Care | Payer: Medicare Other | Attending: Internal Medicine | Admitting: Internal Medicine

## 2021-08-16 ENCOUNTER — Ambulatory Visit
Admission: EM | Admit: 2021-08-16 | Discharge: 2021-08-16 | Disposition: A | Discharge status: Home / Self Care | Payer: Medicare Other

## 2021-08-16 DIAGNOSIS — Z955 Presence of coronary angioplasty implant and graft: Secondary | ICD-10-CM

## 2021-08-16 DIAGNOSIS — F17210 Nicotine dependence, cigarettes, uncomplicated: Secondary | ICD-10-CM | POA: Diagnosis not present

## 2021-08-16 DIAGNOSIS — I4891 Unspecified atrial fibrillation: Secondary | ICD-10-CM | POA: Diagnosis present

## 2021-08-16 DIAGNOSIS — N179 Acute kidney failure, unspecified: Secondary | ICD-10-CM | POA: Diagnosis present

## 2021-08-16 DIAGNOSIS — Z96642 Presence of left artificial hip joint: Secondary | ICD-10-CM | POA: Diagnosis present

## 2021-08-16 DIAGNOSIS — K648 Other hemorrhoids: Secondary | ICD-10-CM | POA: Diagnosis present

## 2021-08-16 DIAGNOSIS — E7800 Pure hypercholesterolemia, unspecified: Secondary | ICD-10-CM | POA: Diagnosis present

## 2021-08-16 DIAGNOSIS — Z8546 Personal history of malignant neoplasm of prostate: Secondary | ICD-10-CM | POA: Diagnosis not present

## 2021-08-16 DIAGNOSIS — J449 Chronic obstructive pulmonary disease, unspecified: Secondary | ICD-10-CM | POA: Diagnosis present

## 2021-08-16 DIAGNOSIS — I714 Abdominal aortic aneurysm, without rupture, unspecified: Secondary | ICD-10-CM | POA: Diagnosis not present

## 2021-08-16 DIAGNOSIS — Z7901 Long term (current) use of anticoagulants: Secondary | ICD-10-CM

## 2021-08-16 DIAGNOSIS — K828 Other specified diseases of gallbladder: Secondary | ICD-10-CM | POA: Diagnosis not present

## 2021-08-16 DIAGNOSIS — R9439 Abnormal result of other cardiovascular function study: Secondary | ICD-10-CM | POA: Diagnosis not present

## 2021-08-16 DIAGNOSIS — F419 Anxiety disorder, unspecified: Secondary | ICD-10-CM | POA: Diagnosis present

## 2021-08-16 DIAGNOSIS — Z7982 Long term (current) use of aspirin: Secondary | ICD-10-CM

## 2021-08-16 DIAGNOSIS — Z8249 Family history of ischemic heart disease and other diseases of the circulatory system: Secondary | ICD-10-CM | POA: Diagnosis not present

## 2021-08-16 DIAGNOSIS — D649 Anemia, unspecified: Secondary | ICD-10-CM | POA: Diagnosis present

## 2021-08-16 DIAGNOSIS — I639 Cerebral infarction, unspecified: Secondary | ICD-10-CM | POA: Diagnosis not present

## 2021-08-16 DIAGNOSIS — K219 Gastro-esophageal reflux disease without esophagitis: Secondary | ICD-10-CM | POA: Diagnosis present

## 2021-08-16 DIAGNOSIS — E860 Dehydration: Secondary | ICD-10-CM | POA: Diagnosis present

## 2021-08-16 DIAGNOSIS — D689 Coagulation defect, unspecified: Secondary | ICD-10-CM | POA: Diagnosis not present

## 2021-08-16 DIAGNOSIS — I959 Hypotension, unspecified: Principal | ICD-10-CM | POA: Diagnosis present

## 2021-08-16 DIAGNOSIS — D696 Thrombocytopenia, unspecified: Secondary | ICD-10-CM | POA: Diagnosis present

## 2021-08-16 DIAGNOSIS — K5900 Constipation, unspecified: Secondary | ICD-10-CM | POA: Diagnosis not present

## 2021-08-16 DIAGNOSIS — I7121 Aneurysm of the ascending aorta, without rupture: Secondary | ICD-10-CM | POA: Diagnosis not present

## 2021-08-16 DIAGNOSIS — I77810 Thoracic aortic ectasia: Secondary | ICD-10-CM | POA: Diagnosis not present

## 2021-08-16 DIAGNOSIS — F32A Depression, unspecified: Secondary | ICD-10-CM | POA: Diagnosis not present

## 2021-08-16 DIAGNOSIS — K5730 Diverticulosis of large intestine without perforation or abscess without bleeding: Secondary | ICD-10-CM | POA: Diagnosis present

## 2021-08-16 DIAGNOSIS — E861 Hypovolemia: Secondary | ICD-10-CM | POA: Diagnosis not present

## 2021-08-16 DIAGNOSIS — I9589 Other hypotension: Secondary | ICD-10-CM | POA: Diagnosis not present

## 2021-08-16 DIAGNOSIS — Z79891 Long term (current) use of opiate analgesic: Secondary | ICD-10-CM

## 2021-08-16 DIAGNOSIS — R0609 Other forms of dyspnea: Secondary | ICD-10-CM | POA: Diagnosis not present

## 2021-08-16 DIAGNOSIS — Z136 Encounter for screening for cardiovascular disorders: Secondary | ICD-10-CM | POA: Diagnosis not present

## 2021-08-16 DIAGNOSIS — Z885 Allergy status to narcotic agent status: Secondary | ICD-10-CM

## 2021-08-16 DIAGNOSIS — R001 Bradycardia, unspecified: Secondary | ICD-10-CM | POA: Diagnosis present

## 2021-08-16 DIAGNOSIS — G5600 Carpal tunnel syndrome, unspecified upper limb: Secondary | ICD-10-CM | POA: Diagnosis not present

## 2021-08-16 DIAGNOSIS — R531 Weakness: Secondary | ICD-10-CM

## 2021-08-16 DIAGNOSIS — I2510 Atherosclerotic heart disease of native coronary artery without angina pectoris: Secondary | ICD-10-CM | POA: Diagnosis not present

## 2021-08-16 DIAGNOSIS — K644 Residual hemorrhoidal skin tags: Secondary | ICD-10-CM | POA: Diagnosis present

## 2021-08-16 DIAGNOSIS — R0602 Shortness of breath: Secondary | ICD-10-CM | POA: Diagnosis not present

## 2021-08-16 DIAGNOSIS — M1990 Unspecified osteoarthritis, unspecified site: Secondary | ICD-10-CM | POA: Diagnosis present

## 2021-08-16 DIAGNOSIS — I719 Aortic aneurysm of unspecified site, without rupture: Secondary | ICD-10-CM | POA: Diagnosis not present

## 2021-08-16 DIAGNOSIS — Z79899 Other long term (current) drug therapy: Secondary | ICD-10-CM

## 2021-08-16 DIAGNOSIS — Z8673 Personal history of transient ischemic attack (TIA), and cerebral infarction without residual deficits: Secondary | ICD-10-CM

## 2021-08-16 DIAGNOSIS — I10 Essential (primary) hypertension: Secondary | ICD-10-CM | POA: Diagnosis not present

## 2021-08-16 DIAGNOSIS — N281 Cyst of kidney, acquired: Secondary | ICD-10-CM | POA: Diagnosis not present

## 2021-08-16 LAB — PHOSPHORUS: Phosphorus: 3.2 mg/dL (ref 2.5–4.6)

## 2021-08-16 LAB — CBC WITH DIFFERENTIAL/PLATELET
Abs Immature Granulocytes: 0.02 10*3/uL (ref 0.00–0.07)
Basophils Absolute: 0.1 10*3/uL (ref 0.0–0.1)
Basophils Relative: 1 %
Eosinophils Absolute: 0.3 10*3/uL (ref 0.0–0.5)
Eosinophils Relative: 6 %
HCT: 34.2 % — ABNORMAL LOW (ref 39.0–52.0)
Hemoglobin: 11.4 g/dL — ABNORMAL LOW (ref 13.0–17.0)
Immature Granulocytes: 0 %
Lymphocytes Relative: 18 %
Lymphs Abs: 1.1 10*3/uL (ref 0.7–4.0)
MCH: 31.2 pg (ref 26.0–34.0)
MCHC: 33.3 g/dL (ref 30.0–36.0)
MCV: 93.7 fL (ref 80.0–100.0)
Monocytes Absolute: 0.8 10*3/uL (ref 0.1–1.0)
Monocytes Relative: 12 %
Neutro Abs: 3.8 10*3/uL (ref 1.7–7.7)
Neutrophils Relative %: 63 %
Platelets: 148 10*3/uL — ABNORMAL LOW (ref 150–400)
RBC: 3.65 MIL/uL — ABNORMAL LOW (ref 4.22–5.81)
RDW: 14.9 % (ref 11.5–15.5)
WBC: 6.1 10*3/uL (ref 4.0–10.5)
nRBC: 0 % (ref 0.0–0.2)

## 2021-08-16 LAB — COMPREHENSIVE METABOLIC PANEL
ALT: 16 U/L (ref 0–44)
AST: 20 U/L (ref 15–41)
Albumin: 4 g/dL (ref 3.5–5.0)
Alkaline Phosphatase: 81 U/L (ref 38–126)
Anion gap: 4 — ABNORMAL LOW (ref 5–15)
BUN: 31 mg/dL — ABNORMAL HIGH (ref 8–23)
CO2: 20 mmol/L — ABNORMAL LOW (ref 22–32)
Calcium: 8.7 mg/dL — ABNORMAL LOW (ref 8.9–10.3)
Chloride: 108 mmol/L (ref 98–111)
Creatinine, Ser: 1.66 mg/dL — ABNORMAL HIGH (ref 0.61–1.24)
GFR, Estimated: 41 mL/min — ABNORMAL LOW (ref >60)
Glucose, Bld: 93 mg/dL (ref 70–99)
Potassium: 3.9 mmol/L (ref 3.5–5.1)
Sodium: 132 mmol/L — ABNORMAL LOW (ref 135–145)
Total Bilirubin: 0.9 mg/dL (ref 0.3–1.2)
Total Protein: 6.9 g/dL (ref 6.5–8.1)

## 2021-08-16 LAB — LACTIC ACID, PLASMA
Lactic Acid, Venous: 1.1 mmol/L (ref 0.5–1.9)
Lactic Acid, Venous: 1.2 mmol/L (ref 0.5–1.9)

## 2021-08-16 LAB — URINALYSIS, ROUTINE W REFLEX MICROSCOPIC
Bilirubin Urine: NEGATIVE
Glucose, UA: NEGATIVE mg/dL
Hgb urine dipstick: NEGATIVE
Ketones, ur: NEGATIVE mg/dL
Leukocytes,Ua: NEGATIVE
Nitrite: NEGATIVE
Protein, ur: NEGATIVE mg/dL
Specific Gravity, Urine: 1.02 (ref 1.005–1.030)
pH: 5 (ref 5.0–8.0)

## 2021-08-16 LAB — TROPONIN I (HIGH SENSITIVITY)
Troponin I (High Sensitivity): 4 ng/L (ref <18)
Troponin I (High Sensitivity): 4 ng/L (ref <18)

## 2021-08-16 LAB — TSH: TSH: 1.053 u[IU]/mL (ref 0.350–4.500)

## 2021-08-16 LAB — APTT: aPTT: 29 seconds (ref 24–36)

## 2021-08-16 LAB — PROTIME-INR
INR: 1.1 (ref 0.8–1.2)
Prothrombin Time: 13.9 seconds (ref 11.4–15.2)

## 2021-08-16 LAB — MAGNESIUM: Magnesium: 1.8 mg/dL (ref 1.7–2.4)

## 2021-08-16 MED ORDER — POLYETHYLENE GLYCOL 3350 17 G PO PACK: 17.0000 g | PACK | Freq: Every day | ORAL | Status: DC | PRN

## 2021-08-16 MED ORDER — ATORVASTATIN CALCIUM 40 MG PO TABS
40.0000 mg | ORAL_TABLET | Freq: Every day | ORAL | Status: DC
  Administered 2021-08-16 – 2021-08-18 (×3): 40 mg via ORAL
  Filled 2021-08-16 (×3): qty 1

## 2021-08-16 MED ORDER — ONDANSETRON HCL 4 MG PO TABS: 4.0000 mg | ORAL_TABLET | Freq: Four times a day (QID) | ORAL | Status: DC | PRN

## 2021-08-16 MED ORDER — ASPIRIN 325 MG PO TBEC
325.0000 mg | DELAYED_RELEASE_TABLET | Freq: Every day | ORAL | Status: DC
  Administered 2021-08-17 – 2021-08-19 (×3): 325 mg via ORAL
  Filled 2021-08-16 (×3): qty 1

## 2021-08-16 MED ORDER — SODIUM CHLORIDE 0.9 % IV SOLN: INTRAVENOUS | Status: AC

## 2021-08-16 MED ORDER — TRAMADOL HCL 50 MG PO TABS
50.0000 mg | ORAL_TABLET | Freq: Two times a day (BID) | ORAL | Status: DC
  Administered 2021-08-16 – 2021-08-19 (×6): 50 mg via ORAL
  Filled 2021-08-16 (×6): qty 1

## 2021-08-16 MED ORDER — ONDANSETRON HCL 4 MG/2ML IJ SOLN: 4.0000 mg | Freq: Four times a day (QID) | INTRAMUSCULAR | Status: DC | PRN

## 2021-08-16 MED ORDER — SERTRALINE HCL 25 MG PO TABS
50.0000 mg | ORAL_TABLET | Freq: Every day | ORAL | Status: DC
  Administered 2021-08-17 – 2021-08-19 (×3): 50 mg via ORAL
  Filled 2021-08-16 (×3): qty 2

## 2021-08-16 MED ORDER — ACETAMINOPHEN 325 MG PO TABS
650.0000 mg | ORAL_TABLET | Freq: Four times a day (QID) | ORAL | Status: DC | PRN
  Administered 2021-08-17: 650 mg via ORAL
  Filled 2021-08-16: qty 2

## 2021-08-16 MED ORDER — ACETAMINOPHEN 650 MG RE SUPP: 650.0000 mg | Freq: Four times a day (QID) | RECTAL | Status: DC | PRN

## 2021-08-16 MED ORDER — ALBUTEROL SULFATE (2.5 MG/3ML) 0.083% IN NEBU: 3.0000 mL | INHALATION_SOLUTION | RESPIRATORY_TRACT | Status: DC | PRN

## 2021-08-16 MED ORDER — CLONAZEPAM 0.5 MG PO TABS
0.5000 mg | ORAL_TABLET | Freq: Three times a day (TID) | ORAL | Status: DC | PRN
  Administered 2021-08-16 – 2021-08-18 (×3): 0.5 mg via ORAL
  Filled 2021-08-16 (×3): qty 1

## 2021-08-16 MED ORDER — MELATONIN 5 MG PO TABS
20.0000 mg | ORAL_TABLET | Freq: Every day | ORAL | Status: DC
  Administered 2021-08-16 – 2021-08-18 (×3): 20 mg via ORAL
  Filled 2021-08-16 (×3): qty 4

## 2021-08-16 NOTE — Progress Notes (Signed)
ON-CALL CARDIOLOGY 08/16/21  Patient's name: Alex Lara..   MRN: [id number].    DOB: 1940/02/14 Primary care provider: Asencion Noble, MD. Primary cardiologist: Dr. Harl Bowie (last OV 2018)  Interaction regarding this patient's care today: ED Physician [facility]) called to review his case this evening.   Patient present to ED from UC due to weakness, low BP, Dyspnea.   ED Physician was concerned regarding new onset of atrial fibrillation.  Personal review of EKG (those available in EMR) illustrates sinus bradycardia.  No clear evidence of A-fib.  In the ED he was noted to have hypotension with SBP of 80 mmHg and multiple progress notes documenting a pale color.  Upon review of EMR patient also known to have a history of AAA which was last evaluated in 2018 as per last cardiology note.  Based on ED providers physical examination abdomen non tender, no groin or back pain, distal pulses not strong. Given the profound hypotension, pale color on physical examination, history of AAA, and no clear etiology of hypotension we did discuss undergoing additional imaging with CT.  However given the renal insufficiency ED provider felt reluctant.  Given the bradycardia, dyspnea, soft blood pressures (initially w/o known etiology), known history of CAD, and given the prior stress test results recommended transfer to [facility] for further evaluation.  Patient was admitted to medicine.  By the time he had dinner and IV fluids patient's blood pressure responded nicely & SBP greater than 120 mmHg.  Given the renal insufficiency that shared decision was to hold off on CT imaging at this time to reduce contrast exposure as he was asymptomatic.  We will proceed with ultrasound of the abdomen in the morning to further evaluate the AAA.  Impression: Symptomatic bradycardia Hypotension Established CAD with prior PCI Dyspnea History of abnormal nuclear stress  test COPD Hyperlipidemia AAA  Recommendations: Currently admitted under hospitalist. Continue telemetry. Recommended IV fluids and trend blood pressures We will check TSH, BNP We will formally consult in the morning. Plan of care initially discussed with ED provider (Dr. Kathrynn Humble) and later with attending physician (Dr. Denton Brick).  Telephone encounter total time: 21 minutes.   Rex Kras, [place], [facility]  Pager: [phone] Office: [phone]

## 2021-08-16 NOTE — Assessment & Plan Note (Addendum)
Blood pressure systolic down to 94/71, resolved without intervention.  Reported generalized weakness, without change in chronic dyspnea.  Without leukocytosis.  No suggestion of infection at this time.   -Hold home losartan -Hydrate with normal saline at 100 cc/h x 15hrs

## 2021-08-16 NOTE — H&P (Addendum)
History and Physical    Sheliah Lara. WGN:[id number] DOB: [birth year] DOA: 08/16/2021  PCP: Asencion Noble, MD   Patient coming from: Home  I have personally briefly reviewed patient's old medical records in [place]  Chief Complaint: Weakness  HPI: Alex Lara. is a 81 y.o. male with medical history significant for COPD, hypertension, depression, coronary artery disease. Patient presented to the ED today with reports of weakness.  Spouse reports she checked patient's blood pressure and it was 80/40.  They went to the urgent care and was referred to the ED as patient appeared pale, and was hypotensive, and they thought patient was in atrial fibrillation. Patient's wife reported patient intermittently will try to lose weight by not eating/starving himself, but believes his oral intake recently is okay.  No vomiting no loose stools.  Patient denies dizziness.  No chest pain.  ED Course: Temperature 97.5.  Heart rate 46- 68.  Respiratory rate 18-24.  Blood pressure systolic down to 78/46, improved to 120/62 without intervention. Creatinine elevated at 1.6.  Lactic acid 1.1.  Potassium 6.1.  Troponin 4 >> 4.  Magnesium 1.8.  Phosphorus 3.2.  TSH 1.053.  Chest x-ray clear.  ED provider was initially concerned about atrial fibrillation on EKG. EDP cardiology, recommended admission to [facility], and CT abdomen and pelvis considering patient's history of aortic aneurysm.  Review of Systems: As per HPI all other systems reviewed and negative.  Past Medical History:  Diagnosis Date   Anxiety and depression    Arteriosclerotic cardiovascular disease (ASCVD)    06/2008: DES to the RCA and LAD   Blood clotting disorder (HCC)    Carpal tunnel syndrome    Chronic anticoagulation    Deep vein thrombosis (HCC)    Degenerative joint disease    Gastroesophageal reflux disease    Hyperlipidemia    Hypertension    Prostate cancer ([place])    Stroke ([place])    Tobacco abuse     Past  Surgical History:  Procedure Laterality Date   COLONOSCOPY  ?  Date   COLONOSCOPY WITH PROPOFOL N/A 07/14/2021   Procedure: COLONOSCOPY WITH PROPOFOL;  Surgeon: Eloise Harman, DO;  Location: [facility];  Service: Endoscopy;  Laterality: N/A;  8:15am, asa 3   INGUINAL HERNIA REPAIR     ORIF-unknown fracture     POLYPECTOMY  07/14/2021   Procedure: POLYPECTOMY;  Surgeon: Eloise Harman, DO;  Location: [facility] END[facility];  Service: Endoscopy;;   PROSTATE BIOPSY     ROTATOR CUFF REPAIR     TOTAL HIP ARTHROPLASTY  2004   Left     reports that he has been smoking cigarettes. He has a 82.50 pack-year smoking history. He has never used smokeless tobacco. He reports current alcohol use. He reports that he does not use drugs.  Allergies  Allergen Reactions   Codeine Nausea And Vomiting and Other (See Comments)    "go fuzzy"    Family History  Problem Relation Age of Onset   Coronary artery disease Father    Coronary artery disease Mother        died post-CABG   Heart disease Mother    Breast cancer Maternal Aunt    Lung cancer Cousin    Melanoma Daughter    Prostate cancer Neg Hx    Prior to Admission medications   Medication Sig Start Date End Date Taking? Authorizing Provider  albuterol (VENTOLIN HFA) 108 (90 Base) MCG/ACT inhaler Inhale 2 puffs  into the lungs every 4 (four) hours as needed for wheezing or shortness of breath. 05/30/21  Yes [provider]  alfuzosin (UROXATRAL) 10 MG 24 hr tablet Take 10 mg by mouth daily. 07/20/21  Yes [provider]  aspirin EC 325 MG tablet Take 325 mg by mouth daily.   Yes [provider]  atorvastatin (LIPITOR) 40 MG tablet Take 1 tablet (40 mg total) by mouth daily at 6 PM. Patient taking differently: Take 40 mg by mouth at bedtime. 01/31/19  Yes Barton Dubois, MD  cholecalciferol (VITAMIN D3) 25 MCG (1000 UNIT) tablet Take 1,000 Units by mouth daily.   Yes [provider]  clonazePAM (KLONOPIN) 0.5 MG  tablet Take 0.5 mg by mouth 3 (three) times daily as needed for anxiety. 05/19/21  Yes [provider]  fluticasone (FLONASE) 50 MCG/ACT nasal spray Place 1 spray into both nostrils daily as needed for allergies or rhinitis.   Yes [provider]  hydrocortisone (ANUSOL-HC) 2.5 % rectal cream Place 1 application  rectally 2 (two) times daily. 06/23/21  Yes Carver, Charles K, DO  ibuprofen (ADVIL) 200 MG tablet Take 800 mg by mouth every 8 (eight) hours as needed for moderate pain.   Yes [provider]  losartan (COZAAR) 50 MG tablet Take 50 mg by mouth at bedtime. 04/25/21  Yes [provider]  melatonin 5 MG TABS Take 20 mg by mouth at bedtime.   Yes [provider]  sertraline (ZOLOFT) 50 MG tablet Take 50 mg by mouth daily. 08/02/21  Yes [provider]  traMADol (ULTRAM) 50 MG tablet Take 50 mg by mouth 2 (two) times daily.   Yes [provider]  HYDROcodone-acetaminophen (NORCO/VICODIN) 5-325 MG tablet Take 1 tablet by mouth every 6 (six) hours as needed. Patient not taking: Reported on 08/16/2021 06/01/21   [provider]    Physical Exam: Vitals:   08/16/21 1800 08/16/21 1830 08/16/21 1900 08/16/21 1930  BP: (!) 104/55 112/64 (!) 117/92 120/62  Pulse: (!) 48 68 (!) 54 (!) 46  Resp: '19 20 19 18  '$ Temp:      TempSrc:      SpO2: 100% 100% 98% 100%    Constitutional: NAD, calm, comfortable Vitals:   08/16/21 1800 08/16/21 1830 08/16/21 1900 08/16/21 1930  BP: (!) 104/55 112/64 (!) 117/92 120/62  Pulse: (!) 48 68 (!) 54 (!) 46  Resp: '19 20 19 18  '$ Temp:      TempSrc:      SpO2: 100% 100% 98% 100%   Eyes: PERRL, lids and conjunctivae normal ENMT: Mucous membranes are moist.  Neck: normal, supple, no masses, no thyromegaly Respiratory: clear to auscultation bilaterally, no wheezing, no crackles. Normal respiratory effort. No accessory muscle use.  Cardiovascular: Regular rate and rhythm, no murmurs / rubs / gallops.  No extremity edema.  Lower extremities warm. Abdomen: no tenderness, no masses palpated. No hepatosplenomegaly. Bowel sounds positive.  Musculoskeletal: no clubbing / cyanosis. No joint deformity upper and lower extremities. Good ROM, no contractures. Normal muscle tone.  Skin: no rashes, lesions, ulcers. No induration Neurologic: No apparent cranial nerve abnormality, 5/5 strength in all extremities. Psychiatric: Normal judgment and insight. Alert and oriented x 3. Normal mood.   Labs on Admission: I have personally reviewed following labs and imaging studies  CBC: Recent Labs  Lab 08/16/21 1539  WBC 6.1  NEUTROABS 3.8  HGB 11.4*  HCT 34.2*  MCV 93.7  PLT 275*   Basic Metabolic Panel:  Recent Labs  Lab 08/16/21 1539  NA 132*  K 3.9  CL 108  CO2 20*  GLUCOSE 93  BUN 31*  CREATININE 1.66*  CALCIUM 8.7*  MG 1.8  PHOS 3.2   GFR: Estimated Creatinine Clearance: 39 mL/min (A) (by C-G formula based on SCr of 1.66 mg/dL (H)). Liver Function Tests: Recent Labs  Lab 08/16/21 1539  AST 20  ALT 16  ALKPHOS 81  BILITOT 0.9  PROT 6.9  ALBUMIN 4.0   Coagulation Profile: Recent Labs  Lab 08/16/21 1539  INR 1.1   Thyroid Function Tests: Recent Labs    08/16/21 1630  TSH 1.053   Anemia Panel: No results for input(s): "VITAMINB12", "FOLATE", "FERRITIN", "TIBC", "IRON", "RETICCTPCT" in the last 72 hours. Urine analysis:    Component Value Date/Time   COLORURINE YELLOW 08/16/2021 1800   APPEARANCEUR CLEAR 08/16/2021 1800   APPEARANCEUR Clear 09/13/2019 1337   LABSPEC 1.020 08/16/2021 1800   PHURINE 5.0 08/16/2021 1800   GLUCOSEU NEGATIVE 08/16/2021 1800   HGBUR NEGATIVE 08/16/2021 1800   BILIRUBINUR NEGATIVE 08/16/2021 1800   BILIRUBINUR Negative 09/13/2019 1337   KETONESUR NEGATIVE 08/16/2021 1800   PROTEINUR NEGATIVE 08/16/2021 1800   UROBILINOGEN 0.2 11/05/2013 1908   NITRITE NEGATIVE 08/16/2021 1800   LEUKOCYTESUR NEGATIVE 08/16/2021 1800     Radiological Exams on Admission: DG Chest Port 1 View  Result Date: 08/16/2021 CLINICAL DATA:  Weakness, shortness of breath EXAM: PORTABLE CHEST 1 VIEW COMPARISON:  07/28/2021 FINDINGS: Single frontal view of the chest demonstrates an unremarkable cardiac silhouette. Atherosclerosis of the thoracic aorta. Stable chronic interstitial scarring without airspace disease, effusion, or pneumothorax. No acute bony abnormalities. IMPRESSION: 1. Chronic scarring, no acute process. Electronically Signed   By: Randa Ngo M.D.   On: 08/16/2021 15:54    EKG: Independently reviewed. Small and quite subtle P waves visible in some leads are more apparent on initial EKG, but rhythm is regular.  Last EKG shows rate of 57, QTC of 420.  No significant ST or T wave abnormalities.   Assessment/Plan Principal Problem:   Hypotension Active Problems:   Stroke (HCC)   Bradycardia   AKI (acute kidney injury) ([place])   Aortic aneurysm (HCC)  Assessment and Plan: * Hypotension Blood pressure systolic down to 16/10, resolved without intervention.  Reported generalized weakness, without change in chronic dyspnea.  Without leukocytosis.  No suggestion of infection at this time.   -Hold home losartan -Hydrate with normal saline at 100 cc/h x 15hrs   Stroke [facility]) - Resume statins, resume aspirin  Bradycardia Heart rates down to 46 in ED, per chart has a history of sinus bradycardia for several years, heart rate down to 55.  EDP was concerned about atrial fibrillation. EKG today with very subtle P waves visible in some leads, but rhythm is regular. - EDP talked to cardiology, Dr. Terri Skains recommended admission to [facility] and CT abdomen and pelvis with hx of aortic aneurysm. -. As patient's creatinine is elevated at 1.6 with GFR of 41, and his hypotension has since resolved without intervention.  I called Dr. Terri Skains and for now as patient is stable, will defer CT abdomen, get ultrasound in the morning, check BNP,  hydrate and reevaluate renal function in a.m. -N.p.o. midnight -Cardiology to see in a.m.   AKI (acute kidney injury) ([place]) Creatinine elevated at 1.6, patient's recent baseline appears to be ~ 1.  - Hold Losartan - N/s 100cc/hr x 15hrs  Aortic aneurysm ([place]) CT 03/2020 showed - fusiform  aneurysmal dilatation of the infrarenal abdominal aorta (3.2 x 3.4 cm). Recommend followup by ultrasound in 3 years.  Patient denies abdominal or groin pain, on exam abdomen is soft. -Will obtain follow-up ultrasound  Coronary artery disease involving native coronary artery of native heart without angina pectoris Follow-up with Dr. Harl Bowie up until 2018.  History of coronary artery disease with DES x2 to LAD in 2010.  Denies chest pain.  EKG without ST or T wave abnormality. -Resume statins, aspirin.   DVT prophylaxis: SCDS for now, pending stable blood pressure and no suspicion for bleed. Code Status: Full code, confirmed with patient and spouse at bedside. Family Communication: Spouse and daughter at bedside. Disposition Plan:  ~ 2 days Consults called: Cardiology Admission status: Obs tele  Author: Bethena Roys, MD 08/16/2021 9:22 PM  For on call review [email or web address].

## 2021-08-16 NOTE — Progress Notes (Signed)
   08/16/21 2137  Assess: MEWS Score  Temp 97.6 F (36.4 C)  BP (!) 147/63  MAP (mmHg) 83  Pulse Rate (!) 50  ECG Heart Rate (!) 50  Resp (!) 21  Level of Consciousness Alert  SpO2 98 %  O2 Device Room Air  Assess: MEWS Score  MEWS Temp 0  MEWS Systolic 0  MEWS Pulse 1  MEWS RR 1  MEWS LOC 0  MEWS Score 2  MEWS Score Color Yellow  Assess: if the MEWS score is Yellow or Red  Were vital signs taken at a resting state? Yes  Focused Assessment No change from prior assessment  Does the patient meet 2 or more of the SIRS criteria? No  MEWS guidelines implemented *See Row Information* Yes  Treat  MEWS Interventions Escalated (See documentation below)  Pain Scale 0-10  Pain Score 0  Take Vital Signs  Increase Vital Sign Frequency  Yellow: Q 2hr X 2 then Q 4hr X 2, if remains yellow, continue Q 4hrs  Escalate  MEWS: Escalate Yellow: discuss with charge nurse/RN and consider discussing with provider and RRT  Notify: Charge Nurse/RN  Name of Charge Nurse/RN Notified [place]  Date Charge Nurse/RN Notified 08/16/21  Time Charge Nurse/RN Notified 2140  Notify: Provider  Provider response See new orders  Date of Provider Response 08/16/21  Time of Provider Response 2200  Document  Patient Outcome Other (Comment) (admit)  Progress note created (see row info) Yes  Assess: SIRS CRITERIA  SIRS Temperature  0  SIRS Pulse 0  SIRS Respirations  1  SIRS WBC 1  SIRS Score Sum  2

## 2021-08-16 NOTE — Assessment & Plan Note (Addendum)
Heart rates down to 46 in ED, per chart has a history of sinus bradycardia for several years, heart rate down to 55.  EDP was concerned about atrial fibrillation. EKG today with very subtle P waves visible in some leads, but rhythm is regular. - EDP talked to cardiology, Dr. Terri Skains recommended admission to [facility] and CT abdomen and pelvis with hx of aortic aneurysm. -. As patient's creatinine is elevated at 1.6 with GFR of 41, and his hypotension has since resolved without intervention.  I called Dr. Terri Skains and for now as patient is stable, will defer CT abdomen, get ultrasound in the morning, check BNP, hydrate and reevaluate renal function in a.m. -N.p.o. midnight -Cardiology to see in a.m.

## 2021-08-16 NOTE — ED Provider Notes (Addendum)
[facility] EMERGENCY DEPARTMENT Provider Note   CSN: [id number] Arrival date & time: 08/16/21  1444     History  No chief complaint on file.   Alex Walen. is a 81 y.o. male.  HPI    81 year old patient comes into the emergency room with chief complaint of weakness and low blood pressure.  He is accompanied by his wife, who provides meaningful history as well.  Patient lives at his home.  He has history of COPD, stroke and CAD.  Over the last 2 or 3 days, patient has been feeling profoundly weak and short of breath.  Any activity that he tries to complete, gets him winded easily.  He denies any chest pain, palpitations, nausea, vomiting, fevers, chills, new cough, abdominal pain, UTI-like symptoms.   Patient's blood pressure has been measuring low.  He has been eating and drinking usual amount.  Patient was taken to urgent care earlier today, with low blood pressure he was sent to the emergency room.   Home Medications Prior to Admission medications   Medication Sig Start Date End Date Taking? Authorizing Provider  albuterol (VENTOLIN HFA) 108 (90 Base) MCG/ACT inhaler Inhale 2 puffs into the lungs every 4 (four) hours as needed for wheezing or shortness of breath. 05/30/21  Yes [provider]  alfuzosin (UROXATRAL) 10 MG 24 hr tablet Take 10 mg by mouth daily. 07/20/21  Yes [provider]  aspirin EC 325 MG tablet Take 325 mg by mouth daily.   Yes [provider]  atorvastatin (LIPITOR) 40 MG tablet Take 1 tablet (40 mg total) by mouth daily at 6 PM. Patient taking differently: Take 40 mg by mouth at bedtime. 01/31/19  Yes Barton Dubois, MD  cholecalciferol (VITAMIN D3) 25 MCG (1000 UNIT) tablet Take 1,000 Units by mouth daily.   Yes [provider]  clonazePAM (KLONOPIN) 0.5 MG tablet Take 0.5 mg by mouth 3 (three) times daily as needed for anxiety. 05/19/21  Yes [provider]  fluticasone (FLONASE) 50 MCG/ACT nasal spray Place  1 spray into both nostrils daily as needed for allergies or rhinitis.   Yes [provider]  hydrocortisone (ANUSOL-HC) 2.5 % rectal cream Place 1 application  rectally 2 (two) times daily. 06/23/21  Yes Carver, Charles K, DO  ibuprofen (ADVIL) 200 MG tablet Take 800 mg by mouth every 8 (eight) hours as needed for moderate pain.   Yes [provider]  losartan (COZAAR) 50 MG tablet Take 50 mg by mouth at bedtime. 04/25/21  Yes [provider]  melatonin 5 MG TABS Take 20 mg by mouth at bedtime.   Yes [provider]  sertraline (ZOLOFT) 50 MG tablet Take 50 mg by mouth daily. 08/02/21  Yes [provider]  traMADol (ULTRAM) 50 MG tablet Take 50 mg by mouth 2 (two) times daily.   Yes [provider]  HYDROcodone-acetaminophen (NORCO/VICODIN) 5-325 MG tablet Take 1 tablet by mouth every 6 (six) hours as needed. Patient not taking: Reported on 08/16/2021 06/01/21   [provider]      Allergies    Codeine    Review of Systems   Review of Systems  All other systems reviewed and are negative.   Physical Exam Updated Vital Signs BP (!) 104/55   Pulse (!) 48   Temp (!) 97.5 F (36.4 C) (Oral)   Resp 19   SpO2 100%  Physical Exam Vitals and nursing note reviewed.  Constitutional:  Appearance: He is well-developed.  HENT:     Head: Atraumatic.  Eyes:     Extraocular Movements: Extraocular movements intact.     Pupils: Pupils are equal, round, and reactive to light.  Cardiovascular:     Rate and Rhythm: Normal rate.  Pulmonary:     Effort: Pulmonary effort is normal.  Abdominal:     Tenderness: There is abdominal tenderness.     Comments: Right upper quadrant tenderness without rebound or guarding  Musculoskeletal:     Cervical back: Neck supple.  Skin:    General: Skin is warm.  Neurological:     Mental Status: He is alert and oriented to person, place, and time.     ED Results / Procedures / Treatments    Labs (all labs ordered are listed, but only abnormal results are displayed) Labs Reviewed  COMPREHENSIVE METABOLIC PANEL - Abnormal; Notable for the following components:      Result Value   Sodium 132 (*)    CO2 20 (*)    BUN 31 (*)    Creatinine, Ser 1.66 (*)    Calcium 8.7 (*)    GFR, Estimated 41 (*)    Anion gap 4 (*)    All other components within normal limits  CBC WITH DIFFERENTIAL/PLATELET - Abnormal; Notable for the following components:   RBC 3.65 (*)    Hemoglobin 11.4 (*)    HCT 34.2 (*)    Platelets 148 (*)    All other components within normal limits  CULTURE, BLOOD (ROUTINE X 2)  CULTURE, BLOOD (ROUTINE X 2)  LACTIC ACID, PLASMA  PROTIME-INR  APTT  URINALYSIS, ROUTINE W REFLEX MICROSCOPIC  MAGNESIUM  PHOSPHORUS  TSH  LACTIC ACID, PLASMA  TROPONIN I (HIGH SENSITIVITY)  TROPONIN I (HIGH SENSITIVITY)    EKG EKG Interpretation  Date/Time:  Monday August 16 2021 15:50:10 EDT Ventricular Rate:  53 PR Interval:    QRS Duration: 108 QT Interval:  429 QTC Calculation: 403 R Axis:   244 Text Interpretation: Atrial fibrillation Right superior axis afib is new Confirmed by Varney Biles [phone]) on 08/16/2021 4:05:07 PM    Radiology DG Chest Port 1 View  Result Date: 08/16/2021 CLINICAL DATA:  Weakness, shortness of breath EXAM: PORTABLE CHEST 1 VIEW COMPARISON:  07/28/2021 FINDINGS: Single frontal view of the chest demonstrates an unremarkable cardiac silhouette. Atherosclerosis of the thoracic aorta. Stable chronic interstitial scarring without airspace disease, effusion, or pneumothorax. No acute bony abnormalities. IMPRESSION: 1. Chronic scarring, no acute process. Electronically Signed   By: Randa Ngo M.D.   On: 08/16/2021 15:54    Procedures .Critical Care  Performed by: Varney Biles, MD Authorized by: Varney Biles, MD   Critical care provider statement:    Critical care time (minutes):  78   Critical care was necessary to treat or  prevent imminent or life-threatening deterioration of the following conditions:  Circulatory failure, renal failure and cardiac failure   Critical care was time spent personally by me on the following activities:  Development of treatment plan with patient or surrogate, discussions with consultants, evaluation of patient's response to treatment, examination of patient, ordering and review of laboratory studies, ordering and review of radiographic studies, ordering and performing treatments and interventions, pulse oximetry, re-evaluation of patient's condition and review of old charts     Medications Ordered in ED Medications - No data to display  ED Course/ Medical Decision Making/ A&P Clinical Course as of 08/16/21 1815  Mon Aug 16, 2021  1610 EKG 12-Lead Patient is EKG reveals bradycardia and A-fib.  Patient is essentially having slow response A-fib.  Sick sinus syndrome also possible.  ChadVasc score is 5.  [AN]  1749 Creatinine(!): 1.66 Creatinine slightly higher than baseline. [AN]  1749 Troponin I (High Sensitivity) Troponin and lactic acid are reassuring. [AN]  1813 Discussed case with Dr. Johney Frame, cardiology.  He thinks that patient might be in sinus bradycardia with junctional rhythm.  I looked at EKG again, I do see some P waves before QRS, however, the EKG is still irregular. Suspicion is still high for a flutter.   Cardiology team recommends that we admit patient to [facility] given the ambiguity.  They also request CT abdomen pelvis is added. [AN]    Clinical Course User Index [AN] Varney Biles, MD       CHA2DS2-VASc Score: 5                    Medical Decision Making Amount and/or Complexity of Data Reviewed Labs: ordered. Decision-making details documented in ED Course. Radiology: ordered. ECG/medicine tests: ordered. Decision-making details documented in ED Course.  Risk Decision regarding hospitalization.   This patient presents to the ED with chief  complaint(s) of weakness, shortness of breath with pertinent past medical history of COPD, CAD, stroke and he was found to be hypotensive prior to ED arrival which further complicates the presenting complaint. The complaint involves an extensive differential diagnosis and also carries with it a high risk of complications and morbidity.    The differential diagnosis includes : Acute coronary syndrome, occult bacteremia, occult sepsis, acute renal failure, severe electrolyte abnormality, severe dehydration, arrhythmia including bradycardia dysrhythmia, medication side effects.  The initial plan is to get basic labs.  We will also get troponin, blood cultures.   Additional history obtained: Additional history obtained from spouse Records reviewed previous admission documents  Independent labs interpretation:  The following labs were independently interpreted: Please see the work-up tab  Independent visualization of imaging: - I independently visualized the following imaging with scope of interpretation limited to determining acute life threatening conditions related to emergency care: X-ray of the chest, which revealed no evidence of congestive heart failure  Treatment and Reassessment: Patient's blood pressure is gradually improved.  Heparin drip initiated.  Cardiology service consulted, patient will need admission for symptomatic A-fib    Final Clinical Impression(s) / ED Diagnoses Final diagnoses:  Atrial fibrillation, unspecified type ([place])  New onset atrial fibrillation ([place])  Hypotension, unspecified hypotension type    Rx / DC Orders ED Discharge Orders     None         Varney Biles, MD 08/16/21 [place], Tenicia Gural, MD 08/16/21 1815

## 2021-08-16 NOTE — ED Triage Notes (Signed)
Pt presents with weakness and sob, pt is ill appearing and pale

## 2021-08-16 NOTE — Assessment & Plan Note (Addendum)
Creatinine elevated at 1.6, patient's recent baseline appears to be ~ 1.  - Hold Losartan - N/s 100cc/hr x 15hrs

## 2021-08-16 NOTE — ED Notes (Signed)
Report given to care link, ETA of 25 mins

## 2021-08-16 NOTE — Assessment & Plan Note (Addendum)
Follow-up with Dr. Harl Bowie up until 2018.  History of coronary artery disease with DES x2 to LAD in 2010.  Denies chest pain.  EKG without ST or T wave abnormality. -Resume statins, aspirin.

## 2021-08-16 NOTE — ED Provider Notes (Signed)
Patient seen and evaluated in triage.  Patient presents for weakness, hypotension, shortness of breath for the past 4 days.  Patient is ill-appearing and pale in triage.  Blood pressure soft today, EKG initially does not appear unchanged from previous, however given risk factors including CAD, history of stroke, I recommended further evaluation and work-up in the emergency room.  Patient's family numbers are in agreement with this plan.  They declined EMS transportation at this time and they will transport him via private vehicle immediately to the emergency room.   Eulogio Bear, NP 08/16/21 [phone]

## 2021-08-16 NOTE — ED Notes (Signed)
Food given to pt per request

## 2021-08-16 NOTE — ED Triage Notes (Signed)
Weakness, sob, low BP. Sent from [facility].

## 2021-08-16 NOTE — Assessment & Plan Note (Signed)
-   Resume statins, resume aspirin

## 2021-08-16 NOTE — Assessment & Plan Note (Addendum)
CT 03/2020 showed - fusiform aneurysmal dilatation of the infrarenal abdominal aorta (3.2 x 3.4 cm). Recommend followup by ultrasound in 3 years.  Patient denies abdominal or groin pain, on exam abdomen is soft. -Will obtain follow-up ultrasound

## 2021-08-17 ENCOUNTER — Encounter (HOSPITAL_COMMUNITY): Payer: Self-pay | Admitting: Internal Medicine

## 2021-08-17 ENCOUNTER — Observation Stay (HOSPITAL_COMMUNITY): Payer: Medicare Other

## 2021-08-17 DIAGNOSIS — E861 Hypovolemia: Secondary | ICD-10-CM

## 2021-08-17 DIAGNOSIS — K5900 Constipation, unspecified: Secondary | ICD-10-CM | POA: Diagnosis present

## 2021-08-17 DIAGNOSIS — K828 Other specified diseases of gallbladder: Secondary | ICD-10-CM | POA: Diagnosis not present

## 2021-08-17 DIAGNOSIS — K5730 Diverticulosis of large intestine without perforation or abscess without bleeding: Secondary | ICD-10-CM | POA: Diagnosis present

## 2021-08-17 DIAGNOSIS — R531 Weakness: Secondary | ICD-10-CM | POA: Diagnosis present

## 2021-08-17 DIAGNOSIS — I9589 Other hypotension: Secondary | ICD-10-CM | POA: Diagnosis not present

## 2021-08-17 DIAGNOSIS — Z955 Presence of coronary angioplasty implant and graft: Secondary | ICD-10-CM | POA: Diagnosis not present

## 2021-08-17 DIAGNOSIS — J449 Chronic obstructive pulmonary disease, unspecified: Secondary | ICD-10-CM | POA: Diagnosis present

## 2021-08-17 DIAGNOSIS — E860 Dehydration: Secondary | ICD-10-CM | POA: Diagnosis present

## 2021-08-17 DIAGNOSIS — I714 Abdominal aortic aneurysm, without rupture, unspecified: Secondary | ICD-10-CM | POA: Diagnosis not present

## 2021-08-17 DIAGNOSIS — E7800 Pure hypercholesterolemia, unspecified: Secondary | ICD-10-CM | POA: Diagnosis present

## 2021-08-17 DIAGNOSIS — I7121 Aneurysm of the ascending aorta, without rupture: Secondary | ICD-10-CM | POA: Diagnosis present

## 2021-08-17 DIAGNOSIS — I10 Essential (primary) hypertension: Secondary | ICD-10-CM | POA: Diagnosis present

## 2021-08-17 DIAGNOSIS — K219 Gastro-esophageal reflux disease without esophagitis: Secondary | ICD-10-CM | POA: Diagnosis present

## 2021-08-17 DIAGNOSIS — N179 Acute kidney failure, unspecified: Secondary | ICD-10-CM | POA: Diagnosis not present

## 2021-08-17 DIAGNOSIS — G5600 Carpal tunnel syndrome, unspecified upper limb: Secondary | ICD-10-CM | POA: Diagnosis present

## 2021-08-17 DIAGNOSIS — I959 Hypotension, unspecified: Secondary | ICD-10-CM | POA: Diagnosis not present

## 2021-08-17 DIAGNOSIS — N281 Cyst of kidney, acquired: Secondary | ICD-10-CM | POA: Diagnosis not present

## 2021-08-17 DIAGNOSIS — F419 Anxiety disorder, unspecified: Secondary | ICD-10-CM | POA: Diagnosis present

## 2021-08-17 DIAGNOSIS — Z8673 Personal history of transient ischemic attack (TIA), and cerebral infarction without residual deficits: Secondary | ICD-10-CM | POA: Diagnosis not present

## 2021-08-17 DIAGNOSIS — R001 Bradycardia, unspecified: Secondary | ICD-10-CM | POA: Diagnosis not present

## 2021-08-17 DIAGNOSIS — I2510 Atherosclerotic heart disease of native coronary artery without angina pectoris: Secondary | ICD-10-CM | POA: Diagnosis present

## 2021-08-17 DIAGNOSIS — Z96642 Presence of left artificial hip joint: Secondary | ICD-10-CM | POA: Diagnosis present

## 2021-08-17 DIAGNOSIS — D689 Coagulation defect, unspecified: Secondary | ICD-10-CM | POA: Diagnosis not present

## 2021-08-17 DIAGNOSIS — Z8249 Family history of ischemic heart disease and other diseases of the circulatory system: Secondary | ICD-10-CM | POA: Diagnosis not present

## 2021-08-17 DIAGNOSIS — F32A Depression, unspecified: Secondary | ICD-10-CM | POA: Diagnosis present

## 2021-08-17 DIAGNOSIS — I4891 Unspecified atrial fibrillation: Secondary | ICD-10-CM | POA: Diagnosis present

## 2021-08-17 DIAGNOSIS — Z8546 Personal history of malignant neoplasm of prostate: Secondary | ICD-10-CM | POA: Diagnosis not present

## 2021-08-17 DIAGNOSIS — D649 Anemia, unspecified: Secondary | ICD-10-CM | POA: Diagnosis present

## 2021-08-17 DIAGNOSIS — F17210 Nicotine dependence, cigarettes, uncomplicated: Secondary | ICD-10-CM | POA: Diagnosis present

## 2021-08-17 DIAGNOSIS — D696 Thrombocytopenia, unspecified: Secondary | ICD-10-CM | POA: Diagnosis not present

## 2021-08-17 LAB — BRAIN NATRIURETIC PEPTIDE: B Natriuretic Peptide: 63.9 pg/mL (ref 0.0–100.0)

## 2021-08-17 LAB — CBC
HCT: 30.1 % — ABNORMAL LOW (ref 39.0–52.0)
Hemoglobin: 9.8 g/dL — ABNORMAL LOW (ref 13.0–17.0)
MCH: 30.9 pg (ref 26.0–34.0)
MCHC: 32.6 g/dL (ref 30.0–36.0)
MCV: 95 fL (ref 80.0–100.0)
Platelets: 128 10*3/uL — ABNORMAL LOW (ref 150–400)
RBC: 3.17 MIL/uL — ABNORMAL LOW (ref 4.22–5.81)
RDW: 14.8 % (ref 11.5–15.5)
WBC: 5.4 10*3/uL (ref 4.0–10.5)
nRBC: 0 % (ref 0.0–0.2)

## 2021-08-17 LAB — BASIC METABOLIC PANEL
Anion gap: 6 (ref 5–15)
BUN: 27 mg/dL — ABNORMAL HIGH (ref 8–23)
CO2: 20 mmol/L — ABNORMAL LOW (ref 22–32)
Calcium: 8.6 mg/dL — ABNORMAL LOW (ref 8.9–10.3)
Chloride: 108 mmol/L (ref 98–111)
Creatinine, Ser: 1.44 mg/dL — ABNORMAL HIGH (ref 0.61–1.24)
GFR, Estimated: 49 mL/min — ABNORMAL LOW (ref >60)
Glucose, Bld: 97 mg/dL (ref 70–99)
Potassium: 3.7 mmol/L (ref 3.5–5.1)
Sodium: 134 mmol/L — ABNORMAL LOW (ref 135–145)

## 2021-08-17 LAB — MRSA NEXT GEN BY PCR, NASAL: MRSA by PCR Next Gen: NOT DETECTED

## 2021-08-17 MED ORDER — LACTATED RINGERS IV SOLN: INTRAVENOUS | Status: DC

## 2021-08-17 MED ORDER — SIMETHICONE 80 MG PO CHEW
80.0000 mg | CHEWABLE_TABLET | Freq: Four times a day (QID) | ORAL | Status: DC | PRN
  Administered 2021-08-17 – 2021-08-18 (×2): 80 mg via ORAL
  Filled 2021-08-17 (×2): qty 1

## 2021-08-17 NOTE — Progress Notes (Signed)
   08/17/21 0800  Vitals  Patient Position (if appropriate) Orthostatic Vitals  Orthostatic Lying   BP- Lying 106/48  Pulse- Lying (!) 46  Orthostatic Sitting  BP- Sitting 104/67  Pulse- Sitting (!) 49  Orthostatic Standing at 0 minutes  BP- Standing at 0 minutes 116/71  Pulse- Standing at 0 minutes 65  Orthostatic Standing at 3 minutes  BP- Standing at 3 minutes 102/65  Pulse- Standing at 3 minutes 60

## 2021-08-17 NOTE — Progress Notes (Signed)
Progress Note  Patient: Alex Lara. LEX:[id number] DOB: Sep 16, 1940  DOA: 08/16/2021  DOS: 08/17/2021    Brief hospital course: [name] is an 81 y.o. male with a history of COPD, HTN, CAD, AAA, depression who presented to the ED by way of urgent with the chief complaint of weakness. He notes some weight loss and feeling generally poorly for the last few days-weeks and feeling increasingly diffusely weak. BP was checked at home, 80/40, so they presented to UC which he was hypotensive, thought to be in atrial fibrillation, and referred to the [facility]. In the ED, HR was consistently bradycardic with sinus bradycardia, rates 40's-60's, BP 87/61. Also with evidence of AKI, SCr 1.6. Troponin normal x2, TSH wnl at 1.053. EDP was concerned for atrial fibrillation and hypotension, consulted cardiology at [facility] [place] who recommended transfer to [facility]. Overnight blood pressure seemed to improve with IV fluids and oral hydration, though HR has continued to be low into 30's. Orthostatic vital signs reveal borderline hypotension without orthostatic change, and minimal chronotropic response. Cardiology is consulted and echocardiogram is pending.   Assessment and Plan: Hypotension: Patient mildly volume depleted at presentation, though has continued to be hypotensive after IV fluids. No bleeding, though there is anemia. No evidence of sepsis.  - Depending on echo results, may benefit from additional IV fluids.  - Check cortisol in AM. Denies recent steroids.  - Avoid sedating medications, hold home losartan. - Doubt this is due to bradycardia  Sinus bradycardia: Not on provocative medications. Troponin negative x2. TSH 1.053. - Avoid negative chronotropes - Continue cardiac monitoring - Echo pending, d/w Dr. Einar Gip.   Normocytic anemia: Revealed more with IV fluids. No active bleeding. Recently had screening colonoscopy without source of bleeding. - Anemia panel in AM.   AKI: Cr 1.66 on arrival, down  to 1.44. Previous values suggest 1.0-1.1 as baseline, though none too recently. Urinalysis is bland.  - Renal U/S ordered, shows no hydronephrosis and echogenicity does not suggest chronic medical renal disease.   Thrombocytopenia: Has had this in the past.  - Monitor in AM.   COPD: No current exacerbation.  - prn albuterol - Pulmonary follow up recommended  History of CVA:  - Continue ASA, statin  AAA: 3.3cm by U/S which is stable from [place].  - Follow up in 3 years recommended.   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 - Echo pending  CAD: History of DES x2 to LAD in 2010. No anginal complaints. Tn neg. BNP wnl.  - Continue ASA, statin   Subjective: Feels diffusely weak, but no focal weakness. No palpitations, or chest pain. No dizziness/lightheadedness.   Objective: Vitals:   08/17/21 0714 08/17/21 1100 08/17/21 1121 08/17/21 1122  BP: 102/65 (!) 106/43  (!) 100/55  Pulse: (!) 44 (!) 54  (!) 44  Resp: '16 16  18  '$ Temp: 98.7 F (37.1 C) 98.6 F (37 C) 98.6 F (37 C)   TempSrc: Oral Oral Oral   SpO2: 96% 98%  95%  Weight:      Height:       Gen: Elderly male in no distress Pulm: Nonlabored breathing room air. Diminished without wheezes or crackles. CV: Regular bradycardia without murmur, rub, or gallop. No JVD, no pitting dependent edema. 2+ Distal (radial, DP) pulses symmetrically GI: Abdomen soft, there is tenderness in RUQ without rebound or guarding, negative Murphy's sign, elsewhere non-tender, non-distended, with normoactive bowel sounds.  Ext: Warm, no deformities Skin: Appears pale  without rashes, lesions or ulcers on visualized skin. Neuro: Alert and oriented. HOH without focal neurological deficits. Psych: Judgement and insight appear fair. Mood euthymic & affect congruent. Behavior is appropriate.    Data Personally reviewed: CBC: Recent Labs  Lab 08/16/21 1539 08/17/21 0132  WBC 6.1 5.4  NEUTROABS 3.8  --   HGB 11.4* 9.8*  HCT 34.2*  30.1*  MCV 93.7 95.0  PLT 148* 478*   Basic Metabolic Panel: Recent Labs  Lab 08/16/21 1539 08/17/21 0132  NA 132* 134*  K 3.9 3.7  CL 108 108  CO2 20* 20*  GLUCOSE 93 97  BUN 31* 27*  CREATININE 1.66* 1.44*  CALCIUM 8.7* 8.6*  MG 1.8  --   PHOS 3.2  --    GFR: Estimated Creatinine Clearance: 45.7 mL/min (A) (by C-G formula based on SCr of 1.44 mg/dL (H)). Liver Function Tests: Recent Labs  Lab 08/16/21 1539  AST 20  ALT 16  ALKPHOS 81  BILITOT 0.9  PROT 6.9  ALBUMIN 4.0   No results for input(s): "LIPASE", "AMYLASE" in the last 168 hours. No results for input(s): "AMMONIA" in the last 168 hours. Coagulation Profile: Recent Labs  Lab 08/16/21 1539  INR 1.1   Cardiac Enzymes: No results for input(s): "CKTOTAL", "CKMB", "CKMBINDEX", "TROPONINI" in the last 168 hours. BNP (last 3 results) No results for input(s): "PROBNP" in the last 8760 hours. HbA1C: No results for input(s): "HGBA1C" in the last 72 hours. CBG: No results for input(s): "GLUCAP" in the last 168 hours. Lipid Profile: No results for input(s): "CHOL", "HDL", "LDLCALC", "TRIG", "CHOLHDL", "LDLDIRECT" in the last 72 hours. Thyroid Function Tests: Recent Labs    08/16/21 1630  TSH 1.053   Anemia Panel: No results for input(s): "VITAMINB12", "FOLATE", "FERRITIN", "TIBC", "IRON", "RETICCTPCT" in the last 72 hours. Urine analysis:    Component Value Date/Time   COLORURINE YELLOW 08/16/2021 1800   APPEARANCEUR CLEAR 08/16/2021 1800   APPEARANCEUR Clear 09/13/2019 1337   LABSPEC 1.020 08/16/2021 1800   PHURINE 5.0 08/16/2021 1800   GLUCOSEU NEGATIVE 08/16/2021 1800   HGBUR NEGATIVE 08/16/2021 1800   BILIRUBINUR NEGATIVE 08/16/2021 1800   BILIRUBINUR Negative 09/13/2019 1337   KETONESUR NEGATIVE 08/16/2021 1800   PROTEINUR NEGATIVE 08/16/2021 1800   UROBILINOGEN 0.2 11/05/2013 1908   NITRITE NEGATIVE 08/16/2021 1800   LEUKOCYTESUR NEGATIVE 08/16/2021 1800   Recent Results (from the past  240 hour(s))  Blood Culture (routine x 2)     Status: None (Preliminary result)   Collection Time: 08/16/21  4:11 PM   Specimen: Right Antecubital; Blood  Result Value Ref Range Status   Specimen Description RIGHT ANTECUBITAL  Final   Special Requests   Final    BOTTLES DRAWN AEROBIC AND ANAEROBIC Blood Culture results may not be optimal due to an inadequate volume of blood received in culture bottles Performed at [facility], [address] [place], [place] [address]    Culture PENDING  Incomplete   Report Status PENDING  Incomplete  Blood Culture (routine x 2)     Status: None (Preliminary result)   Collection Time: 08/16/21  4:59 PM   Specimen: BLOOD RIGHT FOREARM  Result Value Ref Range Status   Specimen Description BLOOD RIGHT FOREARM  Final   Special Requests   Final    BOTTLES DRAWN AEROBIC AND ANAEROBIC Blood Culture results may not be optimal due to an excessive volume of blood received in culture bottles Performed at [facility], [address], [place], [place]  [address]    Culture PENDING  Incomplete   Report Status PENDING  Incomplete  MRSA Next Gen by PCR, Nasal     Status: None   Collection Time: 08/17/21 12:32 AM   Specimen: Nasal Mucosa; Nasal Swab  Result Value Ref Range Status   MRSA by PCR Next Gen NOT DETECTED NOT DETECTED Final    Comment: (NOTE) The GeneXpert MRSA Assay (FDA approved for NASAL specimens only), is one component of a comprehensive MRSA colonization surveillance program. It is not intended to diagnose MRSA infection nor to guide or monitor treatment for MRSA infections. Test performance is not FDA approved in patients less than 23 years old. Performed at [facility], [place] [address], [place], [place] [address]      US Abdomen Complete  Result Date: 08/17/2021 CLINICAL DATA:  RIGHT upper quadrant tenderness EXAM: ABDOMEN ULTRASOUND COMPLETE COMPARISON:  Aortic ultrasound 08/16/2021 FINDINGS: Gallbladder: Normally distended without  stones or wall thickening. No pericholecystic fluid or sonographic Murphy sign. Common bile duct: Diameter: 5 mm, normal Liver: Normal echogenicity without mass or nodularity. Portal vein is patent on color Doppler imaging with normal direction of blood flow towards the liver. IVC: Normal appearance Pancreas: Normal appearance Spleen: Normal appearance, 10.1 cm length Right Kidney: Length: 11.1 cm. Normal cortical thickness and echogenicity. Cyst at inferior pole 3.4 cm diameter, simple features; no follow-up imaging recommended. No additional mass or hydronephrosis. Left Kidney: Length: 10.1 cm. Normal morphology without mass or hydronephrosis. Abdominal aorta: Fusiform aneurysmal dilatation distal abdominal aorta, 3.4 cm diameter Other findings: No RIGHT upper quadrant free fluid. 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 This recommendation follows ACR consensus guidelines: White Paper of the ACR Incidental Findings Committee II on Vascular Findings. J Am Coll Radiol 2013; 10:789-794. No other significant sonographic abnormalities. Electronically Signed   By: Lavonia Dana M.D.   On: 08/17/2021 10:42   US AORTA  Result Date: 08/16/2021 CLINICAL DATA:  Abdominal aortic aneurysm, hypertension EXAM: ULTRASOUND OF ABDOMINAL AORTA TECHNIQUE: Ultrasound examination of the abdominal aorta and proximal common iliac arteries was performed to evaluate for aneurysm. Additional color and Doppler images of the distal aorta were obtained to document patency. COMPARISON:  03/23/2018 FINDINGS: Abdominal aortic measurements as follows: Proximal:  2.2 x 2.8 cm Mid:  2.6 x 2.6 cm Distal:  3.3 x 3.3 cm Patent: Yes, peak systolic velocity is 76.5 cm/s Right common iliac artery: 1.2 x 1.4 cm Left common iliac artery: 1.0 x 1.0 cm IMPRESSION: 1. 3.3 cm abdominal aortic aneurysm. Recommend follow-up every 3 years. Reference: J Am Coll Radiol  4650;35:465-681. Electronically Signed   By: Randa Ngo M.D.   On: 08/16/2021 23:25   DG Chest Port 1 View  Result Date: 08/16/2021 CLINICAL DATA:  Weakness, shortness of breath EXAM: PORTABLE CHEST 1 VIEW COMPARISON:  07/28/2021 FINDINGS: Single frontal view of the chest demonstrates an unremarkable cardiac silhouette. Atherosclerosis of the thoracic aorta. Stable chronic interstitial scarring without airspace disease, effusion, or pneumothorax. No acute bony abnormalities. IMPRESSION: 1. Chronic scarring, no acute process. Electronically Signed   By: Randa Ngo M.D.   On: 08/16/2021 15:54    Family Communication: None at bedside  Disposition: Status is: Inpatient Remains inpatient appropriate because: Ongoing hypotension and bradycardia, incomplete work up. Planned Discharge Destination:  TBD, PT/OT consulted  Patrecia Pour, MD 08/17/2021 4:52 PM Page by [email or web address]

## 2021-08-17 NOTE — TOC Progression Note (Signed)
Transition of Care (TOC) - Progression Note    Patient Details  Name: Lee Kuang. MRN: [id number] Date of Birth: [birth year]  Transition of Care [facility] CM/SW Contact  Angelita Ingles, RN Phone Number:[phone]  08/17/2021, 3:51 PM  Clinical Narrative:     Transition of Care [facility]) Screening Note   Patient Details  Name: Virginia Francisco. Date of Birth: November 22, 1940   Transition of Care [facility]) CM/SW Contact:    Angelita Ingles, RN Phone Number: 08/17/2021, 3:51 PM    Transition of Care Department [facility]) has reviewed patient and no TOC needs have been identified at this time. We will continue to monitor patient advancement through interdisciplinary progression rounds.          Expected Discharge Plan and Services                                                 Social Determinants of Health (SDOH) Interventions    Readmission Risk Interventions     No data to display

## 2021-08-17 NOTE — Consult Note (Signed)
CARDIOLOGY CONSULT NOTE  Patient ID: Alex Lara. MRN: [id number] DOB/AGE: 1940-08-27 81 y.o.  Admit date: 08/16/2021 Referring Physician  Vance Gather, MD Primary Physician:  Asencion Noble, MD Reason for Consultation  CAD, Bradycardia  Patient ID: Alex Lara., male    DOB: 01/07/1941, 81 y.o.   MRN: [id number]  Generalized weakness, abdominal pain HPI:    Elgin Carn.  is a 81 y.o. Patient with coronary artery disease, history of PCI and DES x2 LAD in June 2010 and has known CTO RCA, severe COPD with severely reduced DLCO by PFTs, hypercholesterolemia, mild ascending aortic aneurysm measuring 4.2 cm by echocardiogram in 2021, small AAA by duplex in 2018, admitted to the hospital with generalized weakness and hypotension on admission patient's blood pressure was 80/40 mmHg was seen in the urgent care and eventually sent to emergency room.  Presented to [facility] and eventually transferred to [facility] for further evaluation.  Patient is a poor historian, however on asking questions states that he had been feeling weak over several days now.  He is also having right upper quadrant stomach pain.  Denies any bloody stools or melanotic stools.  Denies fever or chills.  Denies chest pain.  Has chronic dyspnea and chronic cough which is unchanged.  Denies any symptoms of PND or orthopnea or leg edema or symptoms of claudication or TIA.  Unfortunately still actively smoking.  Past Medical History:  Diagnosis Date   Anxiety and depression    Arteriosclerotic cardiovascular disease (ASCVD)    06/2008: DES to the RCA and LAD   Blood clotting disorder (HCC)    Carpal tunnel syndrome    Chronic anticoagulation    Deep vein thrombosis (HCC)    Degenerative joint disease    Gastroesophageal reflux disease    Hyperlipidemia    Hypertension    Prostate cancer ([place])    Stroke ([place])    Tobacco abuse    Past Surgical History:  Procedure Laterality Date   COLONOSCOPY  ?   Date   COLONOSCOPY WITH PROPOFOL N/A 07/14/2021   Procedure: COLONOSCOPY WITH PROPOFOL;  Surgeon: Eloise Harman, DO;  Location: [facility];  Service: Endoscopy;  Laterality: N/A;  8:15am, asa 3   INGUINAL HERNIA REPAIR     ORIF-unknown fracture     POLYPECTOMY  07/14/2021   Procedure: POLYPECTOMY;  Surgeon: Eloise Harman, DO;  Location: [facility];  Service: Endoscopy;;   PROSTATE BIOPSY     ROTATOR CUFF REPAIR     TOTAL HIP ARTHROPLASTY  2004   Left   Social History   Tobacco Use   Smoking status: Every Day    Packs/day: 1.50    Years: 55.00    Total pack years: 82.50    Types: Cigarettes   Smokeless tobacco: Never  Substance Use Topics   Alcohol use: Yes    Alcohol/week: 0.0 standard drinks of alcohol    Comment: Patient states he is cutting down. 2 drinks today. none yesterday. History of ETOH abuse    Family History  Problem Relation Age of Onset   Coronary artery disease Father    Coronary artery disease Mother        died post-CABG   Heart disease Mother    Breast cancer Maternal Aunt    Lung cancer Cousin    Melanoma Daughter    Prostate cancer Neg Hx     Marital Status: Married  ROS  Review of Systems  Cardiovascular:  Positive for dyspnea on exertion (chronic). Negative for chest pain, leg swelling, orthopnea, palpitations and paroxysmal nocturnal dyspnea.  Respiratory:  Positive for cough (chronic), sputum production (chronic) and wheezing.   Gastrointestinal:  Positive for abdominal pain, hematochezia and melena. Negative for change in bowel habit and heartburn.   Objective      08/17/2021    7:14 AM 08/17/2021    5:32 AM 08/17/2021    1:00 AM  Vitals with BMI  Systolic [phone]  Diastolic 65 67 49  Pulse 44 51 46    Blood pressure 102/65, pulse (!) 44, temperature 98.7 F (37.1 C), temperature source Oral, resp. rate 16, height '6\' 1"'$  (1.854 m), weight 78.9 kg, SpO2 96 %.    Physical Exam Neck:     Vascular: No carotid bruit or JVD.   Cardiovascular:     Rate and Rhythm: Regular rhythm. Bradycardia present.     Pulses: Normal pulses and intact distal pulses.     Heart sounds: Heart sounds are distant. No murmur heard.    No gallop.  Pulmonary:     Effort: Pulmonary effort is normal. Prolonged expiration present.     Breath sounds: Decreased breath sounds (diffuse), wheezing (scattered diffuse) and rales (scattered diffuse) present.  Abdominal:     General: Bowel sounds are normal.     Palpations: Abdomen is soft. There is no pulsatile mass.     Tenderness: There is abdominal tenderness in the right upper quadrant and periumbilical area.  Musculoskeletal:     Right lower leg: No edema.     Left lower leg: No edema.    Laboratory examination:   Recent Labs    08/16/21 1539 08/17/21 0132  NA 132* 134*  K 3.9 3.7  CL 108 108  CO2 20* 20*  GLUCOSE 93 97  BUN 31* 27*  CREATININE 1.66* 1.44*  CALCIUM 8.7* 8.6*  GFRNONAA 41* 49*   estimated creatinine clearance is 45.7 mL/min (A) (by C-G formula based on SCr of 1.44 mg/dL (H)).     Latest Ref Rng & Units 08/17/2021    1:32 AM 08/16/2021    3:39 PM 01/30/2019    1:50 PM  CMP  Glucose 70 - 99 mg/dL 97  93  100   BUN 8 - 23 mg/dL '27  31  16   '$ Creatinine 0.61 - 1.24 mg/dL 1.44  1.66  1.10   Sodium 135 - 145 mmol/L 134  132  139   Potassium 3.5 - 5.1 mmol/L 3.7  3.9  3.5   Chloride 98 - 111 mmol/L 108  108  101   CO2 22 - 32 mmol/L 20  20    Calcium 8.9 - 10.3 mg/dL 8.6  8.7    Total Protein 6.5 - 8.1 g/dL  6.9    Total Bilirubin 0.3 - 1.2 mg/dL  0.9    Alkaline Phos 38 - 126 U/L  81    AST 15 - 41 U/L  20    ALT 0 - 44 U/L  16        Latest Ref Rng & Units 08/17/2021    1:32 AM 08/16/2021    3:39 PM 01/30/2019    1:50 PM  CBC  WBC 4.0 - 10.5 K/uL 5.4  6.1    Hemoglobin 13.0 - 17.0 g/dL 9.8  11.4  12.2   Hematocrit 39.0 - 52.0 % 30.1  34.2  36.0   Platelets 150 - 400 K/uL 128  148  Lipid Panel No results for input(s): "CHOL", "TRIG", "[place]",  "VLDL", "HDL", "CHOLHDL", "LDLDIRECT" in the last 8760 hours.  HEMOGLOBIN A1C Lab Results  Component Value Date   HGBA1C 5.5 01/31/2019   MPG 111.15 01/31/2019   TSH Recent Labs    08/16/21 1630  TSH 1.053   BNP (last 3 results) Recent Labs    08/17/21 0132  BNP 63.9    Cardiac Panel (last 3 results) Recent Labs    08/16/21 1539 08/16/21 1758  TROPONINIHS 4 4     Medications and allergies   Allergies  Allergen Reactions   Codeine Nausea And Vomiting and Other (See Comments)    "go fuzzy"     Current Meds  Medication Sig   albuterol (VENTOLIN HFA) 108 (90 Base) MCG/ACT inhaler Inhale 2 puffs into the lungs every 4 (four) hours as needed for wheezing or shortness of breath.   alfuzosin (UROXATRAL) 10 MG 24 hr tablet Take 10 mg by mouth daily.   aspirin EC 325 MG tablet Take 325 mg by mouth daily.   atorvastatin (LIPITOR) 40 MG tablet Take 1 tablet (40 mg total) by mouth daily at 6 PM. (Patient taking differently: Take 40 mg by mouth at bedtime.)   cholecalciferol (VITAMIN D3) 25 MCG (1000 UNIT) tablet Take 1,000 Units by mouth daily.   clonazePAM (KLONOPIN) 0.5 MG tablet Take 0.5 mg by mouth 3 (three) times daily as needed for anxiety.   fluticasone (FLONASE) 50 MCG/ACT nasal spray Place 1 spray into both nostrils daily as needed for allergies or rhinitis.   hydrocortisone (ANUSOL-HC) 2.5 % rectal cream Place 1 application  rectally 2 (two) times daily.   ibuprofen (ADVIL) 200 MG tablet Take 800 mg by mouth every 8 (eight) hours as needed for moderate pain.   losartan (COZAAR) 50 MG tablet Take 50 mg by mouth at bedtime.   melatonin 5 MG TABS Take 20 mg by mouth at bedtime.   sertraline (ZOLOFT) 50 MG tablet Take 50 mg by mouth daily.   traMADol (ULTRAM) 50 MG tablet Take 50 mg by mouth 2 (two) times daily.    Scheduled Meds:  aspirin EC  325 mg Oral Daily   atorvastatin  40 mg Oral QHS   melatonin  20 mg Oral QHS   sertraline  50 mg Oral Daily   traMADol  50  mg Oral BID   Continuous Infusions:  sodium chloride 100 mL/hr at 08/17/21 0534   PRN Meds:.acetaminophen **OR** acetaminophen, albuterol, clonazePAM, ondansetron **OR** ondansetron (ZOFRAN) IV, polyethylene glycol   I/O last 3 completed shifts: In: 623.6 [I.V.:623.6] Out: 550 [Urine:550] Total I/O In: -  Out: 69 [Urine:100]   Radiology:   US AORTA  Result Date: 08/16/2021 CLINICAL DATA:  Abdominal aortic aneurysm, hypertension EXAM: ULTRASOUND OF ABDOMINAL AORTA TECHNIQUE: Ultrasound examination of the abdominal aorta and proximal common iliac arteries was performed to evaluate for aneurysm. Additional color and Doppler images of the distal aorta were obtained to document patency. COMPARISON:  03/23/2018 FINDINGS: Abdominal aortic measurements as follows: Proximal:  2.2 x 2.8 cm Mid:  2.6 x 2.6 cm Distal:  3.3 x 3.3 cm Patent: Yes, peak systolic velocity is 95.2 cm/s Right common iliac artery: 1.2 x 1.4 cm Left common iliac artery: 1.0 x 1.0 cm IMPRESSION: 1. 3.3 cm abdominal aortic aneurysm. Recommend follow-up every 3 years. Reference: J Am Coll Radiol 8413;24:[id number]. Electronically Signed   By: Randa Ngo M.D.   On: 08/16/2021 23:25   DG Chest [facility] [address]  Result Date: 08/16/2021 CLINICAL DATA:  Weakness, shortness of breath EXAM: PORTABLE CHEST 1 VIEW COMPARISON:  07/28/2021 FINDINGS: Single frontal view of the chest demonstrates an unremarkable cardiac silhouette. Atherosclerosis of the thoracic aorta. Stable chronic interstitial scarring without airspace disease, effusion, or pneumothorax. No acute bony abnormalities. IMPRESSION: 1. Chronic scarring, no acute process. Electronically Signed   By: Randa Ngo M.D.   On: 08/16/2021 15:54    Cardiac Studies:   Exercise nuclear stress test 05/27/2017: Blood pressure demonstrated a normal response to exercise. Changed from exercise to pharmacological stress test due to leg pains and SOB Horizontal ST segment depression ST segment  depression of 1 mm was noted after lexiscan injection in the II, III, aVF, V4, V5 and V6 leads. 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 This is an intermediate risk study. The left ventricular ejection fraction is mildly decreased (45-54%). Coronary angiogram 06/10/2008:  LAD is a large-caliber vessel, large D1, mid LAD 80% stenosis, distal LAD mild disease. CX gives origin to 2 large marginals.  Mild disease. RCA is dominant and occluded in the midsegment, distal RCA filled by collaterals from the left. LVEF 35% with inferobasal hypokinesis.  Coronary intervention 06/12/2008: Successful angioplasty to mid LAD with 2 overlapping Xience DES 3.0 x 23 mm and a 2.5 x 15 mm stent postdilated with a 3.25 mm noncompliant balloon.  Echocardiogram 01/31/2019: LVEF 60 to 65% with mild LVH.  Grade 1 diastolic dysfunction.  No wall motion abnormality. Mild aortic valve calcification.  Mild aortic valve sclerosis. Mild dilatation of the aortic root at 4.20 cm.  EKG:  EKG 08/17/2021: Marked sinus bradycardia at the rate of 57 bpm, left axis deviation, left anterior fascicular block.  Incomplete right bundle branch block.  No evidence of ischemia.  Compared to 07/12/2021, heart rate was previously 70 bpm, otherwise no significant change.  Telemetry 8/7 and 08/17/2021: Marked sinus bradycardia, heart rate ranging from 36 to 41 bpm.  No significant arrhythmias or high degree AV block.  Assessment   1.  Asymptomatic sinus bradycardia, no evidence of high degree AV block, no atrial fibrillation.  Hypotension unrelated to sinus bradycardia. 2.  Coronary artery disease of the native vessel without angina pectoris, history of remote angioplasty and stenting to the LAD as noted above in 2010.  EKG revealing marked sinus bradycardia without evidence of ischemia, cardiac troponins were negative x2. 3.  Acute renal failure stage III AA probably secondary to  dehydration, it appears that patient has been trying to lose weight and has been eating less. 4.  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  No obvious bleeding diathesis. 5.  Hypercholesterolemia 6.  Ascending aortic aneurysm, aortic root and ascending aorta measuring 4.2 cm in 2021 and also AAA, small unchanged from 2018 compared to 08/16/2021. 7.  Primary hypertension  Recommendations:   Asar Evilsizer.  is a 81 y.o. Patient with coronary artery disease, history of PCI and DES x2 LAD in June 2010 and has known CTO RCA, severe COPD with severely reduced DLCO by PFTs, hypercholesterolemia, mild ascending aortic aneurysm measuring 4.2 cm by echocardiogram in 2021, small AAA by duplex in 2018, admitted to the hospital with generalized weakness and hypotension on admission patient's blood pressure was 80/40 mmHg was seen in the urgent care and eventually sent to emergency room.  Presented  to [facility] and eventually transferred to [facility] for further evaluation.  I do not see any medications that can induce bradycardia.  This morning his heart rate is around 51 to 52 bpm, again sinus bradycardia.  Last night while sleeping his heart rate had dropped down to 37 to 38 bpm.  No other alarms, no other arrhythmias, no high degree AV block.  No indication for pacemaker.  TSH is normal.  From coronary artery disease standpoint, no indication for acute evaluation for the same.  He does need cardiology follow-up and I will be happy to see him back in the office.  Echocardiogram is pending. He had severe LV systolic dysfunction in 7672 and since then has had angioplasty to the LAD, his LVEF has normalized.  He is not a candidate for beta-blockers obviously in view of underlying sinus bradycardia.  We will continue to follow him until he is stable from cardiac  standpoint with regard to bradycardia and also follow-up on any heart block or arrhythmias while patient is here and will also follow-up his echocardiogram.   He is presently on losartan 50 mg daily at home for hypertension, this is presently on hold, agree with not starting him on any antihypertensive medications in view of soft blood pressure and manage this in the outpatient basis.  Also in view of renal failure would recommend holding any nephrotoxic agents for now.  With regard to lipids, he is presently on atorvastatin, continue the same.  Needs lipid profile testing.  His anemia needs to be worked up, he also complains of abdominal discomfort, has history of internal/external hemorrhoids, management is as per primary team.    Thank you for the consultation.   Adrian Prows, MD, [facility] 08/17/2021, 8:30 AM Office: [phone]

## 2021-08-17 NOTE — Progress Notes (Signed)
  Echocardiogram 2D Echocardiogram has been performed.  Alex Lara 08/17/2021, 4:14 PM

## 2021-08-17 NOTE — Plan of Care (Signed)
  Problem: Clinical Measurements: Goal: Will remain free from infection Outcome: Progressing Goal: Diagnostic test results will improve Outcome: Progressing Goal: Respiratory complications will improve Outcome: Progressing Goal: Cardiovascular complication will be avoided Outcome: Progressing   Problem: Activity: Goal: Risk for activity intolerance will decrease Outcome: Progressing   Problem: Coping: Goal: Level of anxiety will decrease Outcome: Progressing   Problem: Elimination: Goal: Will not experience complications related to urinary retention Outcome: Progressing   Problem: Pain Managment: Goal: General experience of comfort will improve Outcome: Progressing   Problem: Safety: Goal: Ability to remain free from injury will improve Outcome: Progressing

## 2021-08-17 NOTE — Plan of Care (Signed)
  Problem: Education: Goal: Knowledge of General Education information will improve Description: Including pain rating scale, medication(s)/side effects and non-pharmacologic comfort measures Outcome: Progressing   Problem: Clinical Measurements: Goal: Ability to maintain clinical measurements within normal limits will improve Outcome: Progressing Goal: Respiratory complications will improve Outcome: Progressing   Problem: Activity: Goal: Risk for activity intolerance will decrease Outcome: Progressing   Problem: Coping: Goal: Level of anxiety will decrease Outcome: Progressing   Problem: Elimination: Goal: Will not experience complications related to bowel motility Outcome: Progressing Goal: Will not experience complications related to urinary retention Outcome: Progressing   Problem: Pain Managment: Goal: General experience of comfort will improve Outcome: Progressing

## 2021-08-18 DIAGNOSIS — N179 Acute kidney failure, unspecified: Secondary | ICD-10-CM | POA: Diagnosis not present

## 2021-08-18 DIAGNOSIS — I9589 Other hypotension: Secondary | ICD-10-CM | POA: Diagnosis not present

## 2021-08-18 DIAGNOSIS — R001 Bradycardia, unspecified: Secondary | ICD-10-CM | POA: Diagnosis not present

## 2021-08-18 DIAGNOSIS — I7121 Aneurysm of the ascending aorta, without rupture: Secondary | ICD-10-CM | POA: Diagnosis not present

## 2021-08-18 LAB — CBC
HCT: 30.4 % — ABNORMAL LOW (ref 39.0–52.0)
Hemoglobin: 10.5 g/dL — ABNORMAL LOW (ref 13.0–17.0)
MCH: 31.5 pg (ref 26.0–34.0)
MCHC: 34.5 g/dL (ref 30.0–36.0)
MCV: 91.3 fL (ref 80.0–100.0)
Platelets: 136 10*3/uL — ABNORMAL LOW (ref 150–400)
RBC: 3.33 MIL/uL — ABNORMAL LOW (ref 4.22–5.81)
RDW: 14.8 % (ref 11.5–15.5)
WBC: 5.1 10*3/uL (ref 4.0–10.5)
nRBC: 0 % (ref 0.0–0.2)

## 2021-08-18 LAB — ECHOCARDIOGRAM COMPLETE
Area-P 1/2: 3 cm2
Height: 73 in
P 1/2 time: 565 msec
S' Lateral: 2.4 cm
Single Plane A2C EF: 50.2 %
Weight: 2783.09 oz

## 2021-08-18 LAB — IRON AND TIBC
Iron: 54 ug/dL (ref 45–182)
Saturation Ratios: 24 % (ref 17.9–39.5)
TIBC: 224 ug/dL — ABNORMAL LOW (ref 250–450)
UIBC: 170 ug/dL

## 2021-08-18 LAB — RETICULOCYTES
Immature Retic Fract: 6.5 % (ref 2.3–15.9)
RBC.: 3.27 MIL/uL — ABNORMAL LOW (ref 4.22–5.81)
Retic Count, Absolute: 45.5 10*3/uL (ref 19.0–186.0)
Retic Ct Pct: 1.4 % (ref 0.4–3.1)

## 2021-08-18 LAB — LIPID PANEL
Cholesterol: 93 mg/dL (ref 0–200)
HDL: 31 mg/dL — ABNORMAL LOW (ref >40)
LDL Cholesterol: 40 mg/dL (ref 0–99)
Total CHOL/HDL Ratio: 3 RATIO
Triglycerides: 108 mg/dL (ref <150)
VLDL: 22 mg/dL (ref 0–40)

## 2021-08-18 LAB — FERRITIN: Ferritin: 343 ng/mL — ABNORMAL HIGH (ref 24–336)

## 2021-08-18 LAB — BASIC METABOLIC PANEL
Anion gap: 5 (ref 5–15)
BUN: 27 mg/dL — ABNORMAL HIGH (ref 8–23)
CO2: 18 mmol/L — ABNORMAL LOW (ref 22–32)
Calcium: 8.8 mg/dL — ABNORMAL LOW (ref 8.9–10.3)
Chloride: 112 mmol/L — ABNORMAL HIGH (ref 98–111)
Creatinine, Ser: 1.23 mg/dL (ref 0.61–1.24)
GFR, Estimated: 59 mL/min — ABNORMAL LOW (ref >60)
Glucose, Bld: 89 mg/dL (ref 70–99)
Potassium: 4.1 mmol/L (ref 3.5–5.1)
Sodium: 135 mmol/L (ref 135–145)

## 2021-08-18 LAB — CORTISOL-AM, BLOOD: Cortisol - AM: 4.4 ug/dL — ABNORMAL LOW (ref 6.7–22.6)

## 2021-08-18 LAB — FOLATE: Folate: 6.9 ng/mL (ref >5.9)

## 2021-08-18 LAB — VITAMIN B12: Vitamin B-12: 504 pg/mL (ref 180–914)

## 2021-08-18 MED ORDER — CALCIUM POLYCARBOPHIL 625 MG PO TABS
625.0000 mg | ORAL_TABLET | Freq: Every day | ORAL | Status: DC
  Administered 2021-08-18 – 2021-08-19 (×2): 625 mg via ORAL
  Filled 2021-08-18 (×2): qty 1

## 2021-08-18 MED ORDER — HYDROCORTISONE (PERIANAL) 2.5 % EX CREA
1.0000 | TOPICAL_CREAM | Freq: Two times a day (BID) | CUTANEOUS | Status: DC
  Administered 2021-08-18: 1 via RECTAL
  Filled 2021-08-18: qty 28.35

## 2021-08-18 MED ORDER — COSYNTROPIN 0.25 MG IJ SOLR
0.2500 mg | Freq: Once | INTRAMUSCULAR | Status: AC
  Administered 2021-08-19: 0.25 mg via INTRAVENOUS
  Filled 2021-08-18: qty 0.25

## 2021-08-18 NOTE — Progress Notes (Signed)
Progress Note  Patient Name: Alex Lara. Date of Encounter: 08/18/2021  Attending physician: Patrecia Pour, MD Primary care provider: Asencion Noble, MD  Subjective: Sheliah Lara. is a 81 y.o. male who was seen and examined at bedside  Accompanied by his wife at bedside. Denies anginal chest pain, heart failure symptoms, near-syncope or syncope Ambulating on the floors without any symptoms Case discussed and reviewed with his nurse.  Objective: Vital Signs in the last 24 hours: Temp:  [97.5 F (36.4 C)-98.6 F (37 C)] 98 F (36.7 C) (08/09 1049) Pulse Rate:  [44-50] 50 (08/09 1049) Resp:  [14-20] 20 (08/09 1049) BP: (106-135)/(62-75) 119/75 (08/09 1049) SpO2:  [92 %-99 %] 95 % (08/09 1049)  Intake/Output:  Intake/Output Summary (Last 24 hours) at 08/18/2021 1539 Last data filed at 08/18/2021 1500 Gross per 24 hour  Intake 1659.28 ml  Output 400 ml  Net 1259.28 ml    Net IO Since Admission: 2,034.49 mL [08/18/21 1539]  Weights:  Filed Weights   08/16/21 2137  Weight: 78.9 kg    Telemetry:  Personally reviewed.   Predominantly sinus bradycardia. Once heart rate 29 bpm on 08/17/2021 at 22:23- asymptomatic sleeping. No pauses greater than 3 seconds. Good chronotropic competence with ambulation.  Physical examination: PHYSICAL EXAM:    08/18/2021   10:49 AM 08/18/2021    7:32 AM 08/18/2021    3:12 AM  Vitals with BMI  Systolic 962 229 798  Diastolic 75 62 66  Pulse 50 46 44   CONSTITUTIONAL: Appears older than stated age, hemodynamically stable,  No acute distress.  SKIN: Skin is warm and dry. No rash noted. No cyanosis. No pallor. No jaundice HEAD: Normocephalic and atraumatic.  EYES: No scleral icterus MOUTH/THROAT: Dry oral membranes.  NECK: No JVD present. No thyromegaly noted. No carotid bruits  CHEST Normal respiratory effort. No intercostal retractions  LUNGS: Decreased breath sounds bilateral, prolonged expiration, No stridor, wheezes, rales.   CARDIOVASCULAR: Bradycardia, S1 and S2, no murmur, rubs, gallops. ABDOMINAL:  soft,  positive bowel sounds all 4 quadrants. No apparent ascites.  EXTREMITIES: No peripheral edema  HEMATOLOGIC: No significant bruising NEUROLOGIC: Oriented to person, place, and time. Nonfocal. Normal muscle tone.  PSYCHIATRIC: Normal mood and affect. Normal behavior. Cooperative  Lab Results: Hematology Recent Labs  Lab 08/16/21 1539 08/17/21 0132 08/18/21 0050  WBC 6.1 5.4 5.1  RBC 3.65* 3.17* 3.33*  3.27*  HGB 11.4* 9.8* 10.5*  HCT 34.2* 30.1* 30.4*  MCV 93.7 95.0 91.3  MCH 31.2 30.9 31.5  MCHC 33.3 32.6 34.5  RDW 14.9 14.8 14.8  PLT 148* 128* 136*    Chemistry Recent Labs  Lab 08/16/21 1539 08/17/21 0132 08/18/21 0050  NA 132* 134* 135  K 3.9 3.7 4.1  CL 108 108 112*  CO2 20* 20* 18*  GLUCOSE 93 97 89  BUN 31* 27* 27*  CREATININE 1.66* 1.44* 1.23  CALCIUM 8.7* 8.6* 8.8*  PROT 6.9  --   --   ALBUMIN 4.0  --   --   AST 20  --   --   ALT 16  --   --   ALKPHOS 81  --   --   BILITOT 0.9  --   --   GFRNONAA 41* 49* 59*  ANIONGAP 4* 6 5     Cardiac Enzymes: Cardiac Panel (last 3 results) Recent Labs    08/16/21 1539 08/16/21 1758  TROPONINIHS 4 4    BNP (last 3 results) Recent  Labs    08/17/21 0132  BNP 63.9    ProBNP (last 3 results) No results for input(s): "PROBNP" in the last 8760 hours.   DDimer No results for input(s): "DDIMER" in the last 168 hours.   Hemoglobin A1c:  Lab Results  Component Value Date   HGBA1C 5.5 01/31/2019   MPG 111.15 01/31/2019    TSH  Recent Labs    08/16/21 1630  TSH 1.053    Lipid Panel     Component Value Date/Time   CHOL 93 08/18/2021 0050   TRIG 108 08/18/2021 0050   HDL 31 (L) 08/18/2021 0050   CHOLHDL 3.0 08/18/2021 0050   VLDL 22 08/18/2021 0050   LDLCALC 40 08/18/2021 0050    Imaging: ECHOCARDIOGRAM COMPLETE  Result Date: 08/18/2021    ECHOCARDIOGRAM REPORT   Patient Name:   Alex Lara. Date of  Exam: 08/17/2021 Medical Rec #:  [id number]          Height:       73.0 in Accession #:    [id number]         Weight:       173.9 lb Date of Birth:  December 30, 1940          BSA:          2.028 m Patient Age:    74 years           BP:           100/55 mmHg Patient Gender: M                  HR:           50 bpm. Exam Location:  Inpatient Procedure: 2D Echo, Color Doppler and Cardiac Doppler Indications:     dyspnea  History:         Patient has prior history of Echocardiogram examinations, most                  recent 01/31/2019. CAD, COPD; Risk Factors:Current Smoker and                  Dyslipidemia.  Sonographer:     Johny Chess RDCS Referring Phys:  [id number] NKNLZ Crystall Donaldson Diagnosing Phys: Rex Kras DO IMPRESSIONS  1. Left ventricular ejection fraction, by estimation, is 60 to 65%. The left ventricle has normal function. The left ventricle has no regional wall motion abnormalities. Left ventricular diastolic parameters were normal.  2. Right ventricular systolic function is normal. The right ventricular size is normal.  3. The mitral valve is degenerative. No evidence of mitral valve regurgitation. No evidence of mitral stenosis.  4. The aortic valve is tricuspid. Aortic valve regurgitation is trivial. Aortic valve sclerosis is present, with no evidence of aortic valve stenosis.  5. Aortic dilatation noted. There is dilatation of the aortic root, measuring 39 mm. There is dilatation of the ascending aorta, measuring 41 mm.  6. The inferior vena cava is normal in size with greater than 50% respiratory variability, suggesting right atrial pressure of 3 mmHg. Comparison(s): Prior study 01/31/2019: LVEF 60-65%, mild LVH, Grade I Diastolic dysfunction, mild AR, aortic root 57m. FINDINGS  Left Ventricle: Left ventricular ejection fraction, by estimation, is 60 to 65%. The left ventricle has normal function. The left ventricle has no regional wall motion abnormalities. The left ventricular internal cavity size was normal  in size. There is  no left ventricular hypertrophy. Left ventricular diastolic parameters were normal. Right Ventricle: The right  ventricular size is normal. Right vetricular wall thickness was not well visualized. Right ventricular systolic function is normal. Left Atrium: Left atrial size was normal in size. Right Atrium: Right atrial size was normal in size. Prominent Eustachian valve. Pericardium: There is no evidence of pericardial effusion. Mitral Valve: The mitral valve is degenerative in appearance. There is mild thickening of the mitral valve leaflet(s). Normal mobility of the mitral valve leaflets. No evidence of mitral valve regurgitation. No evidence of mitral valve stenosis. Tricuspid Valve: The tricuspid valve is grossly normal. Tricuspid valve regurgitation is trivial. No evidence of tricuspid stenosis. Aortic Valve: The aortic valve is tricuspid. Aortic valve regurgitation is trivial. Aortic regurgitation PHT measures 565 msec. Aortic valve sclerosis is present, with no evidence of aortic valve stenosis. Pulmonic Valve: The pulmonic valve was grossly normal. Pulmonic valve regurgitation is not visualized. No evidence of pulmonic stenosis. Aorta: Aortic dilatation noted. There is dilatation of the aortic root, measuring 39 mm. There is dilatation of the ascending aorta, measuring 41 mm. Venous: The inferior vena cava is normal in size with greater than 50% respiratory variability, suggesting right atrial pressure of 3 mmHg. IAS/Shunts: The atrial septum is grossly normal.  LEFT VENTRICLE PLAX 2D LVIDd:         4.60 cm      Diastology LVIDs:         2.40 cm      LV e' medial:    7.51 cm/s LV PW:         1.30 cm      LV E/e' medial:  10.6 LV IVS:        1.10 cm      LV e' lateral:   10.10 cm/s LVOT diam:     2.40 cm      LV E/e' lateral: 7.9 LV SV:         123 LV SV Index:   60 LVOT Area:     4.52 cm  LV Volumes (MOD) LV vol d, MOD A2C: 115.0 ml LV vol s, MOD A2C: 57.3 ml LV SV MOD A2C:     57.7 ml  RIGHT VENTRICLE             IVC RV Basal diam:  2.80 cm     IVC diam: 1.60 cm RV S prime:     15.10 cm/s TAPSE (M-mode): 2.0 cm LEFT ATRIUM             Index        RIGHT ATRIUM           Index LA diam:        3.30 cm 1.63 cm/m   RA Area:     14.60 cm LA Vol (A2C):   66.2 ml 32.65 ml/m  RA Volume:   35.70 ml  17.61 ml/m LA Vol (A4C):   53.5 ml 26.38 ml/m LA Biplane Vol: 63.7 ml 31.41 ml/m  AORTIC VALVE LVOT Vmax:   123.00 cm/s LVOT Vmean:  77.400 cm/s LVOT VTI:    0.271 m AI PHT:      565 msec  AORTA Ao Root diam: 3.90 cm Ao Asc diam:  4.10 cm MITRAL VALVE MV Area (PHT): 3.00 cm    SHUNTS MV Decel Time: 253 msec    Systemic VTI:  0.27 m MV E velocity: 79.30 cm/s  Systemic Diam: 2.40 cm MV A velocity: 66.20 cm/s MV E/A ratio:  1.20 Jawanza Zambito DO Electronically signed by Rex Kras DO Signature Date/Time: 08/18/2021/1:49:35  PM    Final    US Abdomen Complete  Result Date: 08/17/2021 CLINICAL DATA:  RIGHT upper quadrant tenderness EXAM: ABDOMEN ULTRASOUND COMPLETE COMPARISON:  Aortic ultrasound 08/16/2021 FINDINGS: Gallbladder: Normally distended without stones or wall thickening. No pericholecystic fluid or sonographic Murphy sign. Common bile duct: Diameter: 5 mm, normal Liver: Normal echogenicity without mass or nodularity. Portal vein is patent on color Doppler imaging with normal direction of blood flow towards the liver. IVC: Normal appearance Pancreas: Normal appearance Spleen: Normal appearance, 10.1 cm length Right Kidney: Length: 11.1 cm. Normal cortical thickness and echogenicity. Cyst at inferior pole 3.4 cm diameter, simple features; no follow-up imaging recommended. No additional mass or hydronephrosis. Left Kidney: Length: 10.1 cm. Normal morphology without mass or hydronephrosis. Abdominal aorta: Fusiform aneurysmal dilatation distal abdominal aorta, 3.4 cm diameter Other findings: No RIGHT upper quadrant free fluid. IMPRESSION: 3.4 cm diameter fusiform aneurysmal dilatation of distal  abdominal aorta; Recommend follow-up ultrasound every 3 years. This recommendation follows ACR consensus guidelines: White Paper of the ACR Incidental Findings Committee II on Vascular Findings. J Am Coll Radiol 2013; 10:789-794. No other significant sonographic abnormalities. Electronically Signed   By: Lavonia Dana M.D.   On: 08/17/2021 10:42   US AORTA  Result Date: 08/16/2021 CLINICAL DATA:  Abdominal aortic aneurysm, hypertension EXAM: ULTRASOUND OF ABDOMINAL AORTA TECHNIQUE: Ultrasound examination of the abdominal aorta and proximal common iliac arteries was performed to evaluate for aneurysm. Additional color and Doppler images of the distal aorta were obtained to document patency. COMPARISON:  03/23/2018 FINDINGS: Abdominal aortic measurements as follows: Proximal:  2.2 x 2.8 cm Mid:  2.6 x 2.6 cm Distal:  3.3 x 3.3 cm Patent: Yes, peak systolic velocity is 42.5 cm/s Right common iliac artery: 1.2 x 1.4 cm Left common iliac artery: 1.0 x 1.0 cm IMPRESSION: 1. 3.3 cm abdominal aortic aneurysm. Recommend follow-up every 3 years. Reference: J Am Coll Radiol 9563;87:564-332. Electronically Signed   By: Randa Ngo M.D.   On: 08/16/2021 23:25   DG Chest Port 1 View  Result Date: 08/16/2021 CLINICAL DATA:  Weakness, shortness of breath EXAM: PORTABLE CHEST 1 VIEW COMPARISON:  07/28/2021 FINDINGS: Single frontal view of the chest demonstrates an unremarkable cardiac silhouette. Atherosclerosis of the thoracic aorta. Stable chronic interstitial scarring without airspace disease, effusion, or pneumothorax. No acute bony abnormalities. IMPRESSION: 1. Chronic scarring, no acute process. Electronically Signed   By: Randa Ngo M.D.   On: 08/16/2021 15:54    CARDIAC DATABASE: EKG: EKG 08/17/2021: Marked sinus bradycardia at the rate of 57 bpm, left axis deviation, left anterior fascicular block.  Incomplete right bundle branch block.  No evidence of ischemia.  Compared to 07/12/2021, heart rate was previously  70 bpm, otherwise no significant change.  Echocardiogram: 08/17/2021  1. Left ventricular ejection fraction, by estimation, is 60 to 65%. The left ventricle has normal function. The left ventricle has no regional wall motion abnormalities. Left ventricular diastolic parameters were normal.   2. Right ventricular systolic function is normal. The right ventricular size is normal.   3. The mitral valve is degenerative. No evidence of mitral valve regurgitation. No evidence of mitral stenosis.   4. The aortic valve is tricuspid. Aortic valve regurgitation is trivial. Aortic valve sclerosis is present, with no evidence of aortic valve stenosis.   5. Aortic dilatation noted. There is dilatation of the aortic root, measuring 39 mm. There is dilatation of the ascending aorta, measuring 41 mm.   6. The inferior vena cava  is normal in size with greater than 50% respiratory variability, suggesting right atrial pressure of 3 mmHg.   Comparison(s): Prior study 01/31/2019: LVEF 60-65%, mild LVH, Grade I Diastolic dysfunction, mild AR, aortic root 19m.   Stress test: Exercise nuclear stress test 05/27/2017: Blood pressure demonstrated a normal response to exercise. Changed from exercise to pharmacological stress test due to leg pains and SOB Horizontal ST segment depression ST segment depression of 1 mm was noted after lexiscan injection in the II, III, aVF, V4, V5 and V6 leads. 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 This is an intermediate risk study. The left ventricular ejection fraction is mildly decreased (45-54%).  Heart catheterization: Coronary angiogram 06/10/2008:  LAD is a large-caliber vessel, large D1, mid LAD 80% stenosis, distal LAD mild disease. CX gives origin to 2 large marginals.  Mild disease. RCA is dominant and occluded in the midsegment, distal RCA filled by collaterals from the left. LVEF 35% with inferobasal  hypokinesis.   Coronary intervention 06/12/2008: Successful angioplasty to mid LAD with 2 overlapping Xience DES 3.0 x 23 mm and a 2.5 x 15 mm stent postdilated with a 3.25 mm noncompliant balloon.  Scheduled Meds:  aspirin EC  325 mg Oral Daily   atorvastatin  40 mg Oral QHS   [START ON 08/19/2021] cosyntropin  0.25 mg Intravenous Once   hydrocortisone  1 Application Rectal BID   melatonin  20 mg Oral QHS   polycarbophil  625 mg Oral Daily   sertraline  50 mg Oral Daily   traMADol  50 mg Oral BID    Continuous Infusions:   PRN Meds: acetaminophen **OR** acetaminophen, albuterol, clonazePAM, ondansetron **OR** ondansetron (ZOFRAN) IV, polyethylene glycol, simethicone   IMPRESSION & RECOMMENDATIONS: NNandan Willems is a 81y.o. 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  Impression:  Asymptomatic Sinus Bradycardia.  CAD w/o angina pectoris w/ prior PCI to LAD.  AAA, asymptomatic  Proximal ascending aorta dilatation, 41 mm.  COPD Cigarette smoking.  AKI - resolved.  Anemia Low cortisol level.   Plan:  Asymptomatic sinus bradycardia:  Remains asymptomatic during current hospitalization.  HR at rest ranges between 42-47 bpm and increases w/ ambulation.  Lowest HR yesterday night 08/17/2021 at 22:23 at 29 bpm (patient sleeping) No pause greater than 3 seconds.  No syncope or near syncope.  No reversible cause identified thus far w/ workup.  TSH w/n normal limits.  Echo notes preserved LVEF w/o significant valvular heart disease.  Will need outpatient follow-up - can consider Holter to further evaluate his bradycardia and / or arrhythmic burden.  AM cortisol was low - primary team looking to this further.    CAD w/o angina and prior PCI to LAD: No CP or Heart failure symptoms.  hsTrop negative x 2.  BNP within normal limits.  Echo notes preserved LVEF w/o RWMAs.  Continue ASA  '81mg'$  po qday and Statin  LDL at goal Educated him on the importance of complete smoking cessation.  Recommend outpatient follow up either w/ [facility] or [facility] (used to see Dr. BHarl Bowieas his office is closer to home).   AAA:  Asymptomatic  [place]orta notes 3.3 cm abdominal aortic aneurysm. Recommend  follow-up every 3 years.  Continue ASA and Statin  Educated him on the importance of complete smoking cessation.   Proximal Ascending Aortic aneurysm:  41 mm on recent echo 08/2021 Recommend follow up in 12 months with echo or CT chest.  Recommended the importance of BP management.   Cardiology will sign of for now.  Please call if any questions or concerns arise regarding ALOIS COLGAN Jr.'s care.  Plan of care and recommendations conveyed to the patient and his wife (at bedside)as well as attending physician.   Patient's questions and concerns were addressed to his satisfaction. He voices understanding of the instructions provided during this encounter.   This note was created using a voice recognition software as a result there may be grammatical errors inadvertently enclosed that do not reflect the nature of this encounter. Every attempt is made to correct such errors.  Total time spent: 25 minutes.   Mechele [facility]  Pager: [phone] Office: [phone] 08/18/2021, 3:39 PM

## 2021-08-18 NOTE — Evaluation (Signed)
Physical Therapy Evaluation & Discharge Patient Details Name: Alex Lara. MRN: [id number] DOB: [birth year] Today's Date: 08/18/2021  History of Present Illness  Pt is an 81 y.o. male admitted 08/16/21 with c/o weakness, weight loss, hypotension, bradycardia. PMH includes COPD, HTN, CAD, AAA, CVA, depression.   Clinical Impression  Patient evaluated by Physical Therapy with no further acute PT needs identified. PTA, pt independent without DME, retired from work but still enjoys Scientist, research (life sciences), lives with supportive wife. Today, pt moving well with supervision for safety/lines; pt and family report no concerns regarding his mobility. All education has been completed and the patient has no further questions. Acute PT is signing off. Thank you for this referral.       Recommendations for follow up therapy are one component of a multi-disciplinary discharge planning process, led by the attending physician.  Recommendations may be updated based on patient status, additional functional criteria and insurance authorization.  Follow Up Recommendations No PT follow up      Assistance Recommended at Discharge Intermittent Supervision/Assistance  Patient can return home with the following  Assistance with cooking/housework;Assist for transportation;Help with stairs or ramp for entrance    Equipment Recommendations None recommended by PT  Recommendations for Other Services    Mobility Specialist   Functional Status Assessment Patient has not had a recent decline in their functional status     Precautions / Restrictions Precautions Precautions: Fall;Other (comment) Precaution Comments: urinary frequency Restrictions Weight Bearing Restrictions: No      Mobility  Bed Mobility Overal bed mobility: Modified Independent                  Transfers Overall transfer level: Independent Equipment used: None               General transfer comment: indep standing from EOB and low  toilet height; managing his lines    Ambulation/Gait Ambulation/Gait assistance: Supervision Gait Distance (Feet): 260 Feet Assistive device: None, IV Pole Gait Pattern/deviations: Step-through pattern, Decreased stride length Gait velocity: Decreased     General Gait Details: slow, guarded gait without DME, stability and confidence improving with gait distance; pt also ambulating to/from bathroom while managing IV pole without assist; supervision for safety/lines  Stairs            Wheelchair Mobility    Modified Rankin (Stroke Patients Only)       Balance Overall balance assessment: Needs assistance   Sitting balance-Leahy Scale: Good     Standing balance support: No upper extremity supported, During functional activity Standing balance-Leahy Scale: Good                               Pertinent Vitals/Pain Pain Assessment Pain Assessment: No/denies pain    Home Living Family/patient expects to be discharged to:: Private residence Living Arrangements: Spouse/significant other Available Help at Discharge: Family;Available 24 hours/day Type of Home: House Home Access: Stairs to enter       Home Layout: One level Home Equipment: Cane - single point      Prior Function Prior Level of Function : Independent/Modified Independent;Driving             Mobility Comments: independent without DME; denies recent falls; enjoys making canes; drives ADLs Comments: wife confirms pt indep with ADL/iADLs     Hand Dominance   Dominant Hand: Right    Extremity/Trunk Assessment   Upper Extremity Assessment Upper Extremity Assessment: Overall  WFL for tasks assessed    Lower Extremity Assessment Lower Extremity Assessment: Overall WFL for tasks assessed    Cervical / Trunk Assessment Cervical / Trunk Assessment: Other exceptions Cervical / Trunk Exceptions: forward flexed posture  Communication   Communication: HOH;Expressive difficulties (wife  reports word finding difficulties since h/o stroke)  Cognition Arousal/Alertness: Awake/alert Behavior During Therapy: WFL for tasks assessed/performed Overall Cognitive Status: Within Functional Limits for tasks assessed                                 General Comments: pt aware of word finding difficulties; also note some difficulty multi-tasking with mobility tasks, suspect near baseline cognition        General Comments General comments (skin integrity, edema, etc.): pt's wife and son present, supportive. SpO2 94% on RA, HR 60s    Exercises     Assessment/Plan    PT Assessment Patient does not need any further PT services  PT Problem List         PT Treatment Interventions      PT Goals (Current goals can be found in the Care Plan section)  Acute Rehab PT Goals PT Goal Formulation: All assessment and education complete, DC therapy    Frequency       Co-evaluation               AM-PAC PT "6 Clicks" Mobility  Outcome Measure Help needed turning from your back to your side while in a flat bed without using bedrails?: None Help needed moving from lying on your back to sitting on the side of a flat bed without using bedrails?: None Help needed moving to and from a bed to a chair (including a wheelchair)?: None Help needed standing up from a chair using your arms (e.g., wheelchair or bedside chair)?: None Help needed to walk in hospital room?: A Little Help needed climbing 3-5 steps with a railing? : A Little 6 Click Score: 22    End of Session   Activity Tolerance: Patient tolerated treatment well Patient left: in bed;with call bell/phone within reach;with family/visitor present Nurse Communication: Mobility status PT Visit Diagnosis: Other abnormalities of gait and mobility (R26.89)    Time: 6629-4765 PT Time Calculation (min) (ACUTE ONLY): 19 min   Charges:   PT Evaluation $PT Eval Low Complexity: [place], PT,  DPT Acute Rehabilitation Services  Personal: [facility] Office: [place] 08/18/2021, 11:24 AM

## 2021-08-18 NOTE — Progress Notes (Signed)
Mobility Specialist - Progress Note   08/18/21 1524  Mobility  Activity Ambulated with assistance in hallway  Level of Assistance Contact guard assist, steadying assist  Assistive Device Other (Comment) (HHA)  Distance Ambulated (ft) 420 ft  Activity Response Tolerated well  $Mobility charge 1 Mobility   Post-mobility: 48 HR, 97% SPO2  Pt received in BR agreeable to mobility. Requiring minA for small stumble w/ direction change. No complaints throughout. Left sitting EOB with call bell in reach and all needs met.   Paulla Dolly Mobility Specialist

## 2021-08-18 NOTE — Progress Notes (Signed)
Progress Note  Patient: Alex Lara. JGG:[id number] DOB: [birth year]  DOA: 08/16/2021  DOS: 08/18/2021    Brief hospital course: Alex Lara. is an 81 y.o. male with a history of COPD, HTN, CAD, AAA, depression who presented to the ED by way of urgent with the chief complaint of weakness. He notes some weight loss and feeling generally poorly for the last few days-weeks and feeling increasingly diffusely weak. BP was checked at home, 80/40, so they presented to UC which he was hypotensive, thought to be in atrial fibrillation, and referred to the [facility]. In the ED, HR was consistently bradycardic with sinus bradycardia, rates 40's-60's, BP 87/61. Also with evidence of AKI, SCr 1.6. Troponin normal x2, TSH wnl at 1.053. EDP was concerned for atrial fibrillation and hypotension, consulted cardiology at [facility] who recommended transfer to [facility]. Overnight blood pressure seemed to improve with IV fluids and oral hydration, though HR has continued to be low into 30's. Orthostatic vital signs reveal borderline hypotension without orthostatic change, and minimal chronotropic response. Cardiology is consulted and echocardiogram is pending.   Assessment and Plan: Hypotension: Patient mildly volume depleted at presentation, and has improved with IVF. No bleeding, though there is anemia. No evidence of sepsis.  - Continue monitoring. Cortisol is low, though this was checked very early. Will perform ACTH stim test for adrenal insufficiency r/o (does have hyponatremia and metabolic acidosis). - Avoid sedating medications, hold home losartan.  Sinus bradycardia: Not on provocative medications. Troponin negative x2. TSH 1.053. - Avoid negative chronotropes - Continue cardiac monitoring. HR down into 30's overnight again. - Echo pending.   Normocytic anemia: Revealed more with IV fluids. No active bleeding and has stabilized. Recently had screening colonoscopy with internal and external hemorrhoids,  diverticulosis and a 63m polyp, no active bleeding. - Anemia panel shows replete stores of iron, B12, folate  AKI: Cr 1.66 on arrival, down > 1.23. Previous values suggest 1.0-1.1 as baseline, though none too recently. Urinalysis is bland. Renal U/S shows no hydronephrosis and echogenicity does not suggest chronic medical renal disease. Suspect prerenal azotemia.  - DC IVF and see how he does  Thrombocytopenia: Has had this in the past.  - Stable.  Constipation, hemorrhoids:  - Continue daily fiber as recommended by outpatient GI and topical cream.  COPD: No current exacerbation.  - prn albuterol - Pulmonary follow up recommended  History of CVA:  - Continue ASA, statin  AAA: 3.3cm [place] U[place]  - Follow up in 3 years recommended.   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 - Echo is still pending  CAD: History of DES x2 to LAD in 2010. No anginal complaints. Tn neg. BNP wnl.  - Continue ASA, statin   Subjective: Has been eating much better since arriving here and feels his weakness is improved. Denies chest pain, shortness of breath, palpitations, and lightheadedness.  Objective: Vitals:   08/17/21 2310 08/18/21 0312 08/18/21 0732 08/18/21 1049  BP: 135/68 106/66 113/62 119/75  Pulse: (!) 45 (!) 44 (!) 46 (!) 50  Resp: '18 14 17 20  '$ Temp: (!) 97.5 F (36.4 C) 98 F (36.7 C) 98 F (36.7 C) 98 F (36.7 C)  TempSrc: Oral Oral Oral Oral  SpO2: 94% 96% 92% 95%  Weight:      Height:       Gen: Elderly male in no distress Pulm: Nonlabored breathing room air. Clear, diminished without wheezes or crackles. CV:  Regular bradycardia into high 30's during encounter, mostly in 40-50's. No murmur, rub, or gallop. No JVD, no dependent edema. GI: Abdomen soft, non-tender, non-distended, with normoactive bowel sounds.  Ext: Warm, no deformities Skin: No rashes, lesions or ulcers on visualized skin. Neuro: Alert and oriented. HOH without focal  neurological deficits. Psych: Judgement and insight appear fair. Mood euthymic & affect congruent. Behavior is appropriate.    Data Personally reviewed: CBC: Recent Labs  Lab 08/16/21 1539 08/17/21 0132 08/18/21 0050  WBC 6.1 5.4 5.1  NEUTROABS 3.8  --   --   HGB 11.4* 9.8* 10.5*  HCT 34.2* 30.1* 30.4*  MCV 93.7 95.0 91.3  PLT 148* 128* 295*   Basic Metabolic Panel: Recent Labs  Lab 08/16/21 1539 08/17/21 0132 08/18/21 0050  NA 132* 134* 135  K 3.9 3.7 4.1  CL 108 108 112*  CO2 20* 20* 18*  GLUCOSE 93 97 89  BUN 31* 27* 27*  CREATININE 1.66* 1.44* 1.23  CALCIUM 8.7* 8.6* 8.8*  MG 1.8  --   --   PHOS 3.2  --   --    GFR: Estimated Creatinine Clearance: 53.5 mL/min (by C-G formula based on SCr of 1.23 mg/dL). Liver Function Tests: Recent Labs  Lab 08/16/21 1539  AST 20  ALT 16  ALKPHOS 81  BILITOT 0.9  PROT 6.9  ALBUMIN 4.0   No results for input(s): "LIPASE", "AMYLASE" in the last 168 hours. No results for input(s): "AMMONIA" in the last 168 hours. Coagulation Profile: Recent Labs  Lab 08/16/21 1539  INR 1.1   Cardiac Enzymes: No results for input(s): "CKTOTAL", "CKMB", "CKMBINDEX", "TROPONINI" in the last 168 hours. BNP (last 3 results) No results for input(s): "PROBNP" in the last 8760 hours. HbA1C: No results for input(s): "HGBA1C" in the last 72 hours. CBG: No results for input(s): "GLUCAP" in the last 168 hours. Lipid Profile: Recent Labs    08/18/21 0050  CHOL 93  HDL 31*  LDLCALC 40  TRIG 108  CHOLHDL 3.0   Thyroid Function Tests: Recent Labs    08/16/21 1630  TSH 1.053   Anemia Panel: Recent Labs    08/18/21 0050  VITAMINB12 504  FOLATE 6.9  FERRITIN 343*  TIBC 224*  IRON 54  RETICCTPCT 1.4   Urine analysis:    Component Value Date/Time   COLORURINE YELLOW 08/16/2021 1800   APPEARANCEUR CLEAR 08/16/2021 1800   APPEARANCEUR Clear 09/13/2019 1337   LABSPEC 1.020 08/16/2021 1800   PHURINE 5.0 08/16/2021 1800    GLUCOSEU NEGATIVE 08/16/2021 1800   HGBUR NEGATIVE 08/16/2021 1800   BILIRUBINUR NEGATIVE 08/16/2021 1800   BILIRUBINUR Negative 09/13/2019 1337   KETONESUR NEGATIVE 08/16/2021 1800   PROTEINUR NEGATIVE 08/16/2021 1800   UROBILINOGEN 0.2 11/05/2013 1908   NITRITE NEGATIVE 08/16/2021 1800   LEUKOCYTESUR NEGATIVE 08/16/2021 1800   Recent Results (from the past 240 hour(s))  Blood Culture (routine x 2)     Status: None (Preliminary result)   Collection Time: 08/16/21  4:11 PM   Specimen: Right Antecubital; Blood  Result Value Ref Range Status   Specimen Description RIGHT ANTECUBITAL  Final   Special Requests   Final    BOTTLES DRAWN AEROBIC AND ANAEROBIC Blood Culture results may not be optimal due to an inadequate volume of blood received in culture bottles   Culture   Final    NO GROWTH 2 DAYS Performed at [facility], [address], [place], [place] [address]    Report Status PENDING  Incomplete  Blood Culture (routine x 2)     Status: None (Preliminary result)   Collection Time: 08/16/21  4:59 PM   Specimen: BLOOD RIGHT FOREARM  Result Value Ref Range Status   Specimen Description BLOOD RIGHT FOREARM  Final   Special Requests   Final    BOTTLES DRAWN AEROBIC AND ANAEROBIC Blood Culture results may not be optimal due to an excessive volume of blood received in culture bottles   Culture   Final    NO GROWTH 2 DAYS Performed at [facility], [address] [place] [address], [place], [place] [address]    Report Status PENDING  Incomplete  MRSA Next Gen by PCR, Nasal     Status: None   Collection Time: 08/17/21 12:32 AM   Specimen: Nasal Mucosa; Nasal Swab  Result Value Ref Range Status   MRSA by PCR Next Gen NOT DETECTED NOT DETECTED Final    Comment: (NOTE) The GeneXpert MRSA Assay (FDA approved for NASAL specimens only), is one component of a comprehensive MRSA colonization surveillance program. It is not intended to diagnose MRSA infection nor to guide or monitor treatment for MRSA  infections. Test performance is not FDA approved in patients less than 85 years old. Performed at [facility], [place] [address], [place], [place] [address]      US Abdomen Complete  Result Date: 08/17/2021 CLINICAL DATA:  RIGHT upper quadrant tenderness EXAM: ABDOMEN ULTRASOUND COMPLETE COMPARISON:  Aortic ultrasound 08/16/2021 FINDINGS: Gallbladder: Normally distended without stones or wall thickening. No pericholecystic fluid or sonographic Murphy sign. Common bile duct: Diameter: 5 mm, normal Liver: Normal echogenicity without mass or nodularity. Portal vein is patent on color Doppler imaging with normal direction of blood flow towards the liver. IVC: Normal appearance Pancreas: Normal appearance Spleen: Normal appearance, 10.1 cm length Right Kidney: Length: 11.1 cm. Normal cortical thickness and echogenicity. Cyst at inferior pole 3.4 cm diameter, simple features; no follow-up imaging recommended. No additional mass or hydronephrosis. Left Kidney: Length: 10.1 cm. Normal morphology without mass or hydronephrosis. Abdominal aorta: Fusiform aneurysmal dilatation distal abdominal aorta, 3.4 cm diameter Other findings: No RIGHT upper quadrant free fluid. 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 This recommendation follows ACR consensus guidelines: White Paper of the ACR Incidental Findings Committee II on Vascular Findings. J Am Coll Radiol 2013; 10:789-794. No other significant sonographic abnormalities. Electronically Signed   By: Lavonia Dana M.D.   On: 08/17/2021 10:42   US AORTA  Result Date: 08/16/2021 CLINICAL DATA:  Abdominal aortic aneurysm, hypertension EXAM: ULTRASOUND OF ABDOMINAL AORTA TECHNIQUE: Ultrasound examination of the abdominal aorta and proximal common iliac arteries was performed to evaluate for aneurysm. Additional color and Doppler images of the distal aorta were obtained to document  patency. COMPARISON:  03/23/2018 FINDINGS: Abdominal aortic measurements as follows: Proximal:  2.2 x 2.8 cm Mid:  2.6 x 2.6 cm Distal:  3.3 x 3.3 cm Patent: Yes, peak systolic velocity is 17.0 cm/s Right common iliac artery: 1.2 x 1.4 cm Left common iliac artery: 1.0 x 1.0 cm IMPRESSION: 1. 3.3 cm abdominal aortic aneurysm. Recommend follow-up every 3 years. Reference: J Am Coll Radiol 0174;94:496-759. Electronically Signed   By: Randa Ngo M.D.   On: 08/16/2021 23:25   DG Chest Port 1 View  Result Date: 08/16/2021 CLINICAL DATA:  Weakness, shortness of breath EXAM: PORTABLE CHEST 1 VIEW COMPARISON:  07/28/2021 FINDINGS: Single frontal view of the chest demonstrates an unremarkable  cardiac silhouette. Atherosclerosis of the thoracic aorta. Stable chronic interstitial scarring without airspace disease, effusion, or pneumothorax. No acute bony abnormalities. IMPRESSION: 1. Chronic scarring, no acute process. Electronically Signed   By: Randa Ngo M.D.   On: 08/16/2021 15:54    Family Communication: Spouse and son at bedside  Disposition: Status is: Inpatient Remains inpatient appropriate because: Ongoing hypotension and bradycardia, incomplete work up. Likely home after ACTH stim test if hemodynamically stable 8/10 Planned Discharge Destination: Home, no PT or OT follow up.  Patrecia Pour, MD 08/18/2021 12:50 PM Page by [email or web address]

## 2021-08-18 NOTE — Evaluation (Signed)
Occupational Therapy Evaluation Patient Details Name: Alex Lara. MRN: [id number] DOB: [birth year] Today's Date: 08/18/2021   History of Present Illness Pt is an 81 y.o. male admitted 08/16/21 with c/o weakness, weight loss, hypotension, bradycardia. PMH includes COPD, HTN, CAD, AAA, CVA, depression.   Clinical Impression   Pt and family endorse pt is functioning at or near his baseline and typically ambulates without DME and completes ADLs independently. Pt lives with supportive wife who provides supervision as needed and encourages independence. No further OT needs.      Recommendations for follow up therapy are one component of a multi-disciplinary discharge planning process, led by the attending physician.  Recommendations may be updated based on patient status, additional functional criteria and insurance authorization.   Follow Up Recommendations  No OT follow up    Assistance Recommended at Discharge Intermittent Supervision/Assistance  Patient can return home with the following Assistance with cooking/housework    Functional Status Assessment  Patient has had a recent decline in their functional status and demonstrates the ability to make significant improvements in function in a reasonable and predictable amount of time.  Equipment Recommendations  None recommended by OT    Recommendations for Other Services       Precautions / Restrictions Precautions Precautions: Fall Precaution Comments: urinary frequency Restrictions Weight Bearing Restrictions: No      Mobility Bed Mobility Overal bed mobility: Modified Independent                  Transfers Overall transfer level: Modified independent Equipment used: None               General transfer comment: slow to rise, tentative at first      Balance Overall balance assessment: Needs assistance   Sitting balance-Leahy Scale: Good       Standing balance-Leahy Scale: Good                              ADL either performed or assessed with clinical judgement   ADL                                         General ADL Comments: Set up to supervision for safety.     Vision Ability to See in Adequate Light: 0 Adequate Patient Visual Report: No change from baseline       Perception     Praxis      Pertinent Vitals/Pain Pain Assessment Pain Assessment: No/denies pain     Hand Dominance Right   Extremity/Trunk Assessment Upper Extremity Assessment Upper Extremity Assessment: Overall WFL for tasks assessed   Lower Extremity Assessment Lower Extremity Assessment: Defer to PT evaluation   Cervical / Trunk Assessment Cervical / Trunk Assessment: Other exceptions Cervical / Trunk Exceptions: forward flexed posture   Communication Communication Communication: HOH;Expressive difficulties (hx of word finding deficits)   Cognition Arousal/Alertness: Awake/alert Behavior During Therapy: WFL for tasks assessed/performed Overall Cognitive Status: Within Functional Limits for tasks assessed                                 General Comments: pt aware of word finding difficulties; also note some difficulty multi-tasking with mobility tasks, suspect near baseline cognition     General Comments  pt's wife  and son present, supportive. SpO2 94% on RA, HR 60s    Exercises     Shoulder Instructions      Home Living Family/patient expects to be discharged to:: Private residence Living Arrangements: Spouse/significant other Available Help at Discharge: Family;Available 24 hours/day Type of Home: House Home Access: Stairs to enter     Home Layout: One level     Bathroom Shower/Tub: Tub/shower unit;Walk-in shower   Bathroom Toilet: Standard     Home Equipment: Cane - single point          Prior Functioning/Environment Prior Level of Function : Independent/Modified Independent;Driving             Mobility Comments:  independent without DME; denies recent falls; enjoys making canes; drives ADLs Comments: wife confirms pt indep with ADL/iADLs        OT Problem List:        OT Treatment/Interventions:      OT Goals(Current goals can be found in the care plan section) Acute Rehab OT Goals OT Goal Formulation: With patient/family  OT Frequency:      Co-evaluation              AM-PAC OT "6 Clicks" Daily Activity     Outcome Measure Help from another person eating meals?: None Help from another person taking care of personal grooming?: A Little Help from another person toileting, which includes using toliet, bedpan, or urinal?: A Little Help from another person bathing (including washing, rinsing, drying)?: A Little Help from another person to put on and taking off regular upper body clothing?: None Help from another person to put on and taking off regular lower body clothing?: A Little 6 Click Score: 20   End of Session Equipment Utilized During Treatment: Gait belt Nurse Communication: Mobility status  Activity Tolerance: Patient tolerated treatment well Patient left: Other (comment) (in bathroom)  OT Visit Diagnosis: Muscle weakness (generalized) (M62.81)                Time: 8242-3536 OT Time Calculation (min): 16 min Charges:  OT General Charges $OT Visit: 1 Visit OT Evaluation $OT Eval Low Complexity: [place], OTR/L Acute Rehabilitation Services Office: [phone]  Alex Lara 08/18/2021, 11:40 AM

## 2021-08-19 DIAGNOSIS — R001 Bradycardia, unspecified: Secondary | ICD-10-CM | POA: Diagnosis not present

## 2021-08-19 DIAGNOSIS — N179 Acute kidney failure, unspecified: Secondary | ICD-10-CM | POA: Diagnosis not present

## 2021-08-19 DIAGNOSIS — I9589 Other hypotension: Secondary | ICD-10-CM | POA: Diagnosis not present

## 2021-08-19 DIAGNOSIS — I7121 Aneurysm of the ascending aorta, without rupture: Secondary | ICD-10-CM | POA: Diagnosis not present

## 2021-08-19 LAB — ACTH STIMULATION, 3 TIME POINTS
Cortisol, 30 Min: 25.4 ug/dL
Cortisol, 60 Min: 29.8 ug/dL
Cortisol, Base: 8.6 ug/dL

## 2021-08-19 MED ORDER — CALCIUM POLYCARBOPHIL 625 MG PO TABS: 625.0000 mg | ORAL_TABLET | Freq: Every day | ORAL | 0 refills | Status: AC

## 2021-08-19 NOTE — Plan of Care (Signed)

## 2021-08-19 NOTE — Progress Notes (Addendum)
Progress Note  Patient: Alex Lara. VZD:[id number] DOB: 1940-11-17  DOA: 08/16/2021  DOS: 08/19/2021    Brief hospital course: [name] is an 81 y.o. male with a history of COPD, HTN, CAD, AAA, depression who presented to the ED by way of urgent with the chief complaint of weakness. He notes some weight loss and feeling generally poorly for the last few days-weeks and feeling increasingly diffusely weak. BP was checked at home, 80/40, so they presented to UC which he was hypotensive, thought to be in atrial fibrillation, and referred to the [facility]. In the ED, HR was consistently bradycardic with sinus bradycardia, rates 40's-60's, BP 87/61. Also with evidence of AKI, SCr 1.6. Troponin normal x2, TSH wnl at 1.053. EDP was concerned for atrial fibrillation and hypotension, consulted cardiology at [facility] who recommended transfer to [facility]. Overnight blood pressure seemed to improve with IV fluids and oral hydration, though HR has continued to be low into 30's. Orthostatic vital signs reveal borderline hypotension without orthostatic change, and minimal chronotropic response. Cardiology is consulted and echocardiogram is pending.   8/10 no overnight issues  Assessment and Plan: Hypotension: Patient mildly volume depleted at presentation, and has improved with IVF. No bleeding, though there is anemia. No evidence of sepsis.  - Continue monitoring. Cortisol is low, though this was checked very early. Will perform ACTH stim test for adrenal insufficiency r/o (does have hyponatremia and metabolic acidosis). 8/10 hold losartan , avoid sedating meds   Sinus bradycardia: Not on provocative medications. Troponin negative x2. TSH 1.053. - Avoid negative chronotropes - Continue cardiac monitoring. HR down into 30's 8/10 echo with normal EF and no significant valvular disease Cardiology following-appears to be asymptomatic.  Will need to follow-up as outpatient consider Holter to further evaluate  his bradycardia and or arrhythmia burden. A.m. cortisol pending we will follow-up   Normocytic anemia: Revealed more with IV fluids. No active bleeding and has stabilized. Recently had screening colonoscopy with internal and external hemorrhoids, diverticulosis and a 8m polyp, no active bleeding. 8/10 anemia panel shows replete stores of iron, B12 and folate    AKI: Cr 1.66 on arrival, down > 1.23. Previous values suggest 1.0-1.1 as baseline, though none too recently. Urinalysis is bland. Renal U/S shows no hydronephrosis and echogenicity does not suggest chronic medical renal disease. Suspect prerenal azotemia.  8/10 IV fluids were discontinued Check BMP.  If bicarb low may consider sodium bicarb replacement as needed  Thrombocytopenia: Has had this in the past.  8/10 CBC pending  Constipation, hemorrhoids:  - Continue daily fiber as recommended by outpatient GI and topical cream.  COPD: No current exacerbation.  - prn albuterol - Pulmonary follow up recommended  History of CVA:  - Continue ASA, statin  AAA: 3.3cm by U/S which is [place]  - Follow up in 3 years recommended.   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 - Echo is still pending  CAD: History of DES x2 to LAD in 2010. No anginal complaints. Tn neg. BNP wnl.  - Continue ASA, statin   Subjective:  Denies shortness of breath, dizziness, lightheadedness, or abdominal pain  Objective: Vitals:   08/18/21 2015 08/18/21 2350 08/19/21 0327 08/19/21 0328  BP: 135/76 (!) 150/60 136/67   Pulse: (!) 51 (!) 46 (!) 58   Resp: '17 14 16 16  '$ Temp: 98 F (36.7 C) 97.9 F (36.6 C) 97.6 F (36.4 C)   TempSrc: Oral Oral Oral  SpO2: 98% 97% 94%   Weight:      Height:      Calm, NAD Cta no w/r Reg s1/s2 no gallop Soft benign +bs No edema Awake and alert Mood and affect appropriate in current setting   Data Personally reviewed: CBC: Recent Labs  Lab 08/16/21 1539 08/17/21 0132 08/18/21 0050   WBC 6.1 5.4 5.1  NEUTROABS 3.8  --   --   HGB 11.4* 9.8* 10.5*  HCT 34.2* 30.1* 30.4*  MCV 93.7 95.0 91.3  PLT 148* 128* 081*   Basic Metabolic Panel: Recent Labs  Lab 08/16/21 1539 08/17/21 0132 08/18/21 0050  NA 132* 134* 135  K 3.9 3.7 4.1  CL 108 108 112*  CO2 20* 20* 18*  GLUCOSE 93 97 89  BUN 31* 27* 27*  CREATININE 1.66* 1.44* 1.23  CALCIUM 8.7* 8.6* 8.8*  MG 1.8  --   --   PHOS 3.2  --   --    GFR: Estimated Creatinine Clearance: 53.5 mL/min (by C-G formula based on SCr of 1.23 mg/dL). Liver Function Tests: Recent Labs  Lab 08/16/21 1539  AST 20  ALT 16  ALKPHOS 81  BILITOT 0.9  PROT 6.9  ALBUMIN 4.0   No results for input(s): "LIPASE", "AMYLASE" in the last 168 hours. No results for input(s): "AMMONIA" in the last 168 hours. Coagulation Profile: Recent Labs  Lab 08/16/21 1539  INR 1.1   Cardiac Enzymes: No results for input(s): "CKTOTAL", "CKMB", "CKMBINDEX", "TROPONINI" in the last 168 hours. BNP (last 3 results) No results for input(s): "PROBNP" in the last 8760 hours. HbA1C: No results for input(s): "HGBA1C" in the last 72 hours. CBG: No results for input(s): "GLUCAP" in the last 168 hours. Lipid Profile: Recent Labs    08/18/21 0050  CHOL 93  HDL 31*  LDLCALC 40  TRIG 108  CHOLHDL 3.0   Thyroid Function Tests: Recent Labs    08/16/21 1630  TSH 1.053   Anemia Panel: Recent Labs    08/18/21 0050  VITAMINB12 504  FOLATE 6.9  FERRITIN 343*  TIBC 224*  IRON 54  RETICCTPCT 1.4   Urine analysis:    Component Value Date/Time   COLORURINE YELLOW 08/16/2021 1800   APPEARANCEUR CLEAR 08/16/2021 1800   APPEARANCEUR Clear 09/13/2019 1337   LABSPEC 1.020 08/16/2021 1800   PHURINE 5.0 08/16/2021 1800   GLUCOSEU NEGATIVE 08/16/2021 1800   HGBUR NEGATIVE 08/16/2021 1800   BILIRUBINUR NEGATIVE 08/16/2021 1800   BILIRUBINUR Negative 09/13/2019 1337   KETONESUR NEGATIVE 08/16/2021 1800   PROTEINUR NEGATIVE 08/16/2021 1800    UROBILINOGEN 0.2 11/05/2013 1908   NITRITE NEGATIVE 08/16/2021 1800   LEUKOCYTESUR NEGATIVE 08/16/2021 1800   Recent Results (from the past 240 hour(s))  Blood Culture (routine x 2)     Status: None (Preliminary result)   Collection Time: 08/16/21  4:11 PM   Specimen: Right Antecubital; Blood  Result Value Ref Range Status   Specimen Description RIGHT ANTECUBITAL  Final   Special Requests   Final    BOTTLES DRAWN AEROBIC AND ANAEROBIC Blood Culture results may not be optimal due to an inadequate volume of blood received in culture bottles   Culture   Final    NO GROWTH 3 DAYS Performed at [facility], [address] [place], [place] [address]    Report Status PENDING  Incomplete  Blood Culture (routine x 2)     Status: None (Preliminary result)   Collection Time: 08/16/21  4:59 PM  Specimen: BLOOD RIGHT FOREARM  Result Value Ref Range Status   Specimen Description BLOOD RIGHT FOREARM  Final   Special Requests   Final    BOTTLES DRAWN AEROBIC AND ANAEROBIC Blood Culture results may not be optimal due to an excessive volume of blood received in culture bottles   Culture   Final    NO GROWTH 3 DAYS Performed at [facility], [address] [place], [place] [address]    Report Status PENDING  Incomplete  MRSA Next Gen by PCR, Nasal     Status: None   Collection Time: 08/17/21 12:32 AM   Specimen: Nasal Mucosa; Nasal Swab  Result Value Ref Range Status   MRSA by PCR Next Gen NOT DETECTED NOT DETECTED Final    Comment: (NOTE) The GeneXpert MRSA Assay (FDA approved for NASAL specimens only), is one component of a comprehensive MRSA colonization surveillance program. It is not intended to diagnose MRSA infection nor to guide or monitor treatment for MRSA infections. Test performance is not FDA approved in patients less than 57 years old. Performed at [facility], [place] [address] [place], [place] [address]      ECHOCARDIOGRAM COMPLETE  Result Date: 08/18/2021     ECHOCARDIOGRAM REPORT   Patient Name:   Jaren Vanetten. Date of Exam: 08/17/2021 Medical Rec #:  [id number]          Height:       73.0 in Accession #:    [id number]         Weight:       173.9 lb Date of Birth:  1940-06-10          BSA:          2.028 m Patient Age:    44 years           BP:           100/55 mmHg Patient Gender: M                  HR:           50 bpm. Exam Location:  Inpatient Procedure: 2D Echo, Color Doppler and Cardiac Doppler Indications:     dyspnea  History:         Patient has prior history of Echocardiogram examinations, most                  recent 01/31/2019. CAD, COPD; Risk Factors:Current Smoker and                  Dyslipidemia.  Sonographer:     Johny Chess RDCS Referring Phys:  [id number] YTKZS TOLIA Diagnosing Phys: Rex Kras DO IMPRESSIONS  1. Left ventricular ejection fraction, by estimation, is 60 to 65%. The left ventricle has normal function. The left ventricle has no regional wall motion abnormalities. Left ventricular diastolic parameters were normal.  2. Right ventricular systolic function is normal. The right ventricular size is normal.  3. The mitral valve is degenerative. No evidence of mitral valve regurgitation. No evidence of mitral stenosis.  4. The aortic valve is tricuspid. Aortic valve regurgitation is trivial. Aortic valve sclerosis is present, with no evidence of aortic valve stenosis.  5. Aortic dilatation noted. There is dilatation of the aortic root, measuring 39 mm. There is dilatation of the ascending aorta, measuring 41 mm.  6. The inferior vena cava is normal in size with greater than 50% respiratory variability, suggesting right atrial pressure of 3 mmHg.  Comparison(s): Prior study 01/31/2019: LVEF 60-65%, mild LVH, Grade I Diastolic dysfunction, mild AR, aortic root 45m. FINDINGS  Left Ventricle: Left ventricular ejection fraction, by estimation, is 60 to 65%. The left ventricle has normal function. The left ventricle has no regional wall motion  abnormalities. The left ventricular internal cavity size was normal in size. There is  no left ventricular hypertrophy. Left ventricular diastolic parameters were normal. Right Ventricle: The right ventricular size is normal. Right vetricular wall thickness was not well visualized. Right ventricular systolic function is normal. Left Atrium: Left atrial size was normal in size. Right Atrium: Right atrial size was normal in size. Prominent Eustachian valve. Pericardium: There is no evidence of pericardial effusion. Mitral Valve: The mitral valve is degenerative in appearance. There is mild thickening of the mitral valve leaflet(s). Normal mobility of the mitral valve leaflets. No evidence of mitral valve regurgitation. No evidence of mitral valve stenosis. Tricuspid Valve: The tricuspid valve is grossly normal. Tricuspid valve regurgitation is trivial. No evidence of tricuspid stenosis. Aortic Valve: The aortic valve is tricuspid. Aortic valve regurgitation is trivial. Aortic regurgitation PHT measures 565 msec. Aortic valve sclerosis is present, with no evidence of aortic valve stenosis. Pulmonic Valve: The pulmonic valve was grossly normal. Pulmonic valve regurgitation is not visualized. No evidence of pulmonic stenosis. Aorta: Aortic dilatation noted. There is dilatation of the aortic root, measuring 39 mm. There is dilatation of the ascending aorta, measuring 41 mm. Venous: The inferior vena cava is normal in size with greater than 50% respiratory variability, suggesting right atrial pressure of 3 mmHg. IAS/Shunts: The atrial septum is grossly normal.  LEFT VENTRICLE PLAX 2D LVIDd:         4.60 cm      Diastology LVIDs:         2.40 cm      LV e' medial:    7.51 cm/s LV PW:         1.30 cm      LV E/e' medial:  10.6 LV IVS:        1.10 cm      LV e' lateral:   10.10 cm/s LVOT diam:     2.40 cm      LV E/e' lateral: 7.9 LV SV:         123 LV SV Index:   60 LVOT Area:     4.52 cm  LV Volumes (MOD) LV vol d, MOD  A2C: 115.0 ml LV vol s, MOD A2C: 57.3 ml LV SV MOD A2C:     57.7 ml RIGHT VENTRICLE             IVC RV Basal diam:  2.80 cm     IVC diam: 1.60 cm RV S prime:     15.10 cm/s TAPSE (M-mode): 2.0 cm LEFT ATRIUM             Index        RIGHT ATRIUM           Index LA diam:        3.30 cm 1.63 cm/m   RA Area:     14.60 cm LA Vol (A2C):   66.2 ml 32.65 ml/m  RA Volume:   35.70 ml  17.61 ml/m LA Vol (A4C):   53.5 ml 26.38 ml/m LA Biplane Vol: 63.7 ml 31.41 ml/m  AORTIC VALVE LVOT Vmax:   123.00 cm/s LVOT Vmean:  77.400 cm/s LVOT VTI:    0.271 m AI PHT:  565 msec  AORTA Ao Root diam: 3.90 cm Ao Asc diam:  4.10 cm MITRAL VALVE MV Area (PHT): 3.00 cm    SHUNTS MV Decel Time: 253 msec    Systemic VTI:  0.27 m MV E velocity: 79.30 cm/s  Systemic Diam: 2.40 cm MV A velocity: 66.20 cm/s MV E/A ratio:  1.20 Sunit Tolia DO Electronically signed by Rex Kras DO Signature Date/Time: 08/18/2021/1:49:35 PM    Final    US Abdomen Complete  Result Date: 08/17/2021 CLINICAL DATA:  RIGHT upper quadrant tenderness EXAM: ABDOMEN ULTRASOUND COMPLETE COMPARISON:  Aortic ultrasound 08/16/2021 FINDINGS: Gallbladder: Normally distended without stones or wall thickening. No pericholecystic fluid or sonographic Murphy sign. Common bile duct: Diameter: 5 mm, normal Liver: Normal echogenicity without mass or nodularity. Portal vein is patent on color Doppler imaging with normal direction of blood flow towards the liver. IVC: Normal appearance Pancreas: Normal appearance Spleen: Normal appearance, 10.1 cm length Right Kidney: Length: 11.1 cm. Normal cortical thickness and echogenicity. Cyst at inferior pole 3.4 cm diameter, simple features; no follow-up imaging recommended. No additional mass or hydronephrosis. Left Kidney: Length: 10.1 cm. Normal morphology without mass or hydronephrosis. Abdominal aorta: Fusiform aneurysmal dilatation distal abdominal aorta, 3.4 cm diameter Other findings: No RIGHT upper quadrant free fluid.  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 This recommendation follows ACR consensus guidelines: White Paper of the ACR Incidental Findings Committee II on Vascular Findings. J Am Coll Radiol 2013; 10:789-794. No other significant sonographic abnormalities. Electronically Signed   By: Lavonia Dana M.D.   On: 08/17/2021 10:42    Family Communication: Family at bedside  Disposition: Status is: Inpatient Remains inpatient appropriate because: Ongoing hypotension and bradycardia, incomplete work up. Likely dc in am. Planned Discharge Destination: Home, no PT or OT follow up.  Time spent: 35 min  Nolberto Hanlon, MD 08/19/2021 9:00 AM Page by [email or web address]

## 2021-08-19 NOTE — Discharge Summary (Signed)
Alex Lara. VHQ:[id number] DOB: 07/14/1940 DOA: 08/16/2021  PCP: Asencion Noble, MD  Admit date: 08/16/2021 Discharge date: 08/22/2021  Admitted From: home Disposition:  home  Recommendations for Outpatient Follow-up:  Follow up with PCP in 1 week Please obtain BMP/CBC in one week Please follow up with cardiology in 1 week     Discharge Condition:Stable CODE STATUS: Full Diet recommendation: Heart Healthy  Brief/Interim Summary: Per HPI Alex Lara. is an 81 y.o. male with a history of COPD, HTN, CAD, AAA, depression who presented to the ED by way of urgent with the chief complaint of weakness. He notes some weight loss and feeling generally poorly for the last few days-weeks and feeling increasingly diffusely weak. BP was checked at home, 80/40, so they presented to UC which he was hypotensive, thought to be in atrial fibrillation, and referred to the [facility]. In the ED, HR was consistently bradycardic with sinus bradycardia, rates 40's-60's, BP 87/61. Also with evidence of AKI, SCr 1.6. Troponin normal x2, TSH wnl at 1.053. EDP was concerned for atrial fibrillation and hypotension, consulted cardiology at [facility] who recommended transfer to [facility]. Overnight blood pressure seemed to improve with IV fluids and oral hydration, though HR has continued to be low into 30's. Orthostatic vital signs reveal borderline hypotension without orthostatic change, and minimal chronotropic response.  Cardiology was consulted.  He was found to be bradycardic however cardiology felt he was asymptomatic and will need follow-up as outpatient for monitor placement.  He is stable to be discharged home this a.m.  Will need to follow-up with PCP for medical management.  Hypotension: Patient mildly volume depleted at presentation, and has improved with IVF. No bleeding, though there is anemia. No evidence of sepsis.  Initial cortisol was low but  ACTH stim test for adrenal insufficiency, cortisol nml        Sinus bradycardia: Not on provocative medications. Troponin negative x2. TSH 1.053. echo with normal EF and no significant valvular disease Cardiology following-appears to be asymptomatic.  Will need to follow-up as outpatient consider Holter to further evaluate his bradycardia and or arrhythmia burden.      Normocytic anemia: Revealed more with IV fluids. No active bleeding and has stabilized. Recently had screening colonoscopy with internal and external hemorrhoids, diverticulosis and a 48m polyp, no active bleeding. 8/10 anemia panel shows replete stores of iron, B12 and folate       AKI: Cr 1.66 on arrival, Renal U/S shows no hydronephrosis and echogenicity does not suggest chronic medical renal disease. Suspect prerenal azotemia.  Creatinine improved.   Thrombocytopenia: Has had this in the past.  Platelets improved some   Constipation, hemorrhoids:  Improved with bowel regimen   COPD: No current exacerbation.  - Pulmonary follow up recommended   History of CVA:  - Continue ASA, statin   AAA: 3.3cm by U/S which is stable from [place]  - Follow up in 3 years recommended.    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 Echo nml EF. Trivial AR. Asc. Aorta 425m  CAD: History of DES x2 to LAD in 2010. No anginal complaints. Tn neg. BNP wnl.  - Continue ASA, statin      Discharge Diagnoses:  Principal Problem:   Hypotension Active Problems:   Stroke ([facility]  Bradycardia   AKI (acute kidney injury) ([facility]  Aortic aneurysm (HCC)   Coronary artery disease involving native coronary artery of native heart without angina pectoris  Discharge Instructions  Discharge Instructions     Diet - low sodium heart healthy   Complete by: As directed    Discharge instructions   Complete by: As directed    Hydrate.  Follow up with your pcp and cardiology   Increase activity slowly   Complete by: As directed       Allergies as of 08/19/2021       Reactions    Codeine Nausea And Vomiting, Other (See Comments)   "go fuzzy"        Medication List     STOP taking these medications    alfuzosin 10 MG 24 hr tablet Commonly known as: UROXATRAL   HYDROcodone-acetaminophen 5-325 MG tablet Commonly known as: NORCO/VICODIN   ibuprofen 200 MG tablet Commonly known as: ADVIL   losartan 50 MG tablet Commonly known as: COZAAR       TAKE these medications    albuterol 108 (90 Base) MCG/ACT inhaler Commonly known as: VENTOLIN HFA Inhale 2 puffs into the lungs every 4 (four) hours as needed for wheezing or shortness of breath.   aspirin EC 325 MG tablet Take 325 mg by mouth daily.   atorvastatin 40 MG tablet Commonly known as: LIPITOR Take 1 tablet (40 mg total) by mouth daily at 6 PM. What changed: when to take this   cholecalciferol 25 MCG (1000 UNIT) tablet Commonly known as: VITAMIN D3 Take 1,000 Units by mouth daily.   clonazePAM 0.5 MG tablet Commonly known as: KLONOPIN Take 0.5 mg by mouth 3 (three) times daily as needed for anxiety.   fluticasone 50 MCG/ACT nasal spray Commonly known as: FLONASE Place 1 spray into both nostrils daily as needed for allergies or rhinitis.   hydrocortisone 2.5 % rectal cream Commonly known as: ANUSOL-HC Place 1 application  rectally 2 (two) times daily.   melatonin 5 MG Tabs Take 20 mg by mouth at bedtime.   polycarbophil 625 MG tablet Commonly known as: FIBERCON Take 1 tablet (625 mg total) by mouth daily for 14 days.   sertraline 50 MG tablet Commonly known as: ZOLOFT Take 50 mg by mouth daily.   traMADol 50 MG tablet Commonly known as: ULTRAM Take 50 mg by mouth 2 (two) times daily.        Follow-up Information     Rex Kras, DO Follow up on 09/15/2021.   Specialties: Cardiology, Vascular Surgery Why: 9am Bradycardia Contact information: [place] [address] [phone]         Asencion Noble, MD Follow up in 1 week(s).   Specialty:  Internal Medicine Contact information: [address] [place] [address] [phone]                Allergies  Allergen Reactions   Codeine Nausea And Vomiting and Other (See Comments)    "go fuzzy"    Consultations: cardiology   Procedures/Studies: ECHOCARDIOGRAM COMPLETE  Result Date: 08/18/2021    ECHOCARDIOGRAM REPORT   Patient Name:   Alex Lara. Date of Exam: 08/17/2021 Medical Rec #:  [id number]          Height:       73.0 in Accession #:    [id number]         Weight:       173.9 lb Date of Birth:  [birth year]          BSA:          2.028 m Patient Age:  81 years           BP:           100/55 mmHg Patient Gender: M                  HR:           50 bpm. Exam Location:  Inpatient Procedure: 2D Echo, Color Doppler and Cardiac Doppler Indications:     dyspnea  History:         Patient has prior history of Echocardiogram examinations, most                  recent 01/31/2019. CAD, COPD; Risk Factors:Current Smoker and                  Dyslipidemia.  Sonographer:     Johny Chess RDCS Referring Phys:  [id number] JQBHA TOLIA Diagnosing Phys: Rex Kras DO IMPRESSIONS  1. Left ventricular ejection fraction, by estimation, is 60 to 65%. The left ventricle has normal function. The left ventricle has no regional wall motion abnormalities. Left ventricular diastolic parameters were normal.  2. Right ventricular systolic function is normal. The right ventricular size is normal.  3. The mitral valve is degenerative. No evidence of mitral valve regurgitation. No evidence of mitral stenosis.  4. The aortic valve is tricuspid. Aortic valve regurgitation is trivial. Aortic valve sclerosis is present, with no evidence of aortic valve stenosis.  5. Aortic dilatation noted. There is dilatation of the aortic root, measuring 39 mm. There is dilatation of the ascending aorta, measuring 41 mm.  6. The inferior vena cava is normal in size with greater than 50% respiratory variability,  suggesting right atrial pressure of 3 mmHg. Comparison(s): Prior study 01/31/2019: LVEF 60-65%, mild LVH, Grade I Diastolic dysfunction, mild AR, aortic root 71m. FINDINGS  Left Ventricle: Left ventricular ejection fraction, by estimation, is 60 to 65%. The left ventricle has normal function. The left ventricle has no regional wall motion abnormalities. The left ventricular internal cavity size was normal in size. There is  no left ventricular hypertrophy. Left ventricular diastolic parameters were normal. Right Ventricle: The right ventricular size is normal. Right vetricular wall thickness was not well visualized. Right ventricular systolic function is normal. Left Atrium: Left atrial size was normal in size. Right Atrium: Right atrial size was normal in size. Prominent Eustachian valve. Pericardium: There is no evidence of pericardial effusion. Mitral Valve: The mitral valve is degenerative in appearance. There is mild thickening of the mitral valve leaflet(s). Normal mobility of the mitral valve leaflets. No evidence of mitral valve regurgitation. No evidence of mitral valve stenosis. Tricuspid Valve: The tricuspid valve is grossly normal. Tricuspid valve regurgitation is trivial. No evidence of tricuspid stenosis. Aortic Valve: The aortic valve is tricuspid. Aortic valve regurgitation is trivial. Aortic regurgitation PHT measures 565 msec. Aortic valve sclerosis is present, with no evidence of aortic valve stenosis. Pulmonic Valve: The pulmonic valve was grossly normal. Pulmonic valve regurgitation is not visualized. No evidence of pulmonic stenosis. Aorta: Aortic dilatation noted. There is dilatation of the aortic root, measuring 39 mm. There is dilatation of the ascending aorta, measuring 41 mm. Venous: The inferior vena cava is normal in size with greater than 50% respiratory variability, suggesting right atrial pressure of 3 mmHg. IAS/Shunts: The atrial septum is grossly normal.  LEFT VENTRICLE PLAX 2D  LVIDd:         4.60 cm      Diastology LVIDs:  2.40 cm      LV e' medial:    7.51 cm/s LV PW:         1.30 cm      LV E/e' medial:  10.6 LV IVS:        1.10 cm      LV e' lateral:   10.10 cm/s LVOT diam:     2.40 cm      LV E/e' lateral: 7.9 LV SV:         123 LV SV Index:   60 LVOT Area:     4.52 cm  LV Volumes (MOD) LV vol d, MOD A2C: 115.0 ml LV vol s, MOD A2C: 57.3 ml LV SV MOD A2C:     57.7 ml RIGHT VENTRICLE             IVC RV Basal diam:  2.80 cm     IVC diam: 1.60 cm RV S prime:     15.10 cm/s TAPSE (M-mode): 2.0 cm LEFT ATRIUM             Index        RIGHT ATRIUM           Index LA diam:        3.30 cm 1.63 cm/m   RA Area:     14.60 cm LA Vol (A2C):   66.2 ml 32.65 ml/m  RA Volume:   35.70 ml  17.61 ml/m LA Vol (A4C):   53.5 ml 26.38 ml/m LA Biplane Vol: 63.7 ml 31.41 ml/m  AORTIC VALVE LVOT Vmax:   123.00 cm/s LVOT Vmean:  77.400 cm/s LVOT VTI:    0.271 m AI PHT:      565 msec  AORTA Ao Root diam: 3.90 cm Ao Asc diam:  4.10 cm MITRAL VALVE MV Area (PHT): 3.00 cm    SHUNTS MV Decel Time: 253 msec    Systemic VTI:  0.27 m MV E velocity: 79.30 cm/s  Systemic Diam: 2.40 cm MV A velocity: 66.20 cm/s MV E/A ratio:  1.20 Sunit Tolia DO Electronically signed by Rex Kras DO Signature Date/Time: 08/18/2021/1:49:35 PM    Final    US Abdomen Complete  Result Date: 08/17/2021 CLINICAL DATA:  RIGHT upper quadrant tenderness EXAM: ABDOMEN ULTRASOUND COMPLETE COMPARISON:  Aortic ultrasound 08/16/2021 FINDINGS: Gallbladder: Normally distended without stones or wall thickening. No pericholecystic fluid or sonographic Murphy sign. Common bile duct: Diameter: 5 mm, normal Liver: Normal echogenicity without mass or nodularity. Portal vein is patent on color Doppler imaging with normal direction of blood flow towards the liver. IVC: Normal appearance Pancreas: Normal appearance Spleen: Normal appearance, 10.1 cm length Right Kidney: Length: 11.1 cm. Normal cortical thickness and echogenicity. Cyst at  inferior pole 3.4 cm diameter, simple features; no follow-up imaging recommended. No additional mass or hydronephrosis. Left Kidney: Length: 10.1 cm. Normal morphology without mass or hydronephrosis. Abdominal aorta: Fusiform aneurysmal dilatation distal abdominal aorta, 3.4 cm diameter Other findings: No RIGHT upper quadrant free fluid. 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 This recommendation follows ACR consensus guidelines: White Paper of the ACR Incidental Findings Committee II on Vascular Findings. J Am Coll Radiol 2013; 10:789-794. No other significant sonographic abnormalities. Electronically Signed   By: Lavonia Dana M.D.   On: 08/17/2021 10:42   US AORTA  Result Date: 08/16/2021 CLINICAL DATA:  Abdominal aortic aneurysm, hypertension EXAM: ULTRASOUND OF ABDOMINAL AORTA TECHNIQUE: Ultrasound examination of the abdominal aorta  and proximal common iliac arteries was performed to evaluate for aneurysm. Additional color and Doppler images of the distal aorta were obtained to document patency. COMPARISON:  03/23/2018 FINDINGS: Abdominal aortic measurements as follows: Proximal:  2.2 x 2.8 cm Mid:  2.6 x 2.6 cm Distal:  3.3 x 3.3 cm Patent: Yes, peak systolic velocity is 91.4 cm/s Right common iliac artery: 1.2 x 1.4 cm Left common iliac artery: 1.0 x 1.0 cm IMPRESSION: 1. 3.3 cm abdominal aortic aneurysm. Recommend follow-up every 3 years. Reference: J Am Coll Radiol 7829;56:213-086. Electronically Signed   By: Randa Ngo M.D.   On: 08/16/2021 23:25   DG Chest Port 1 View  Result Date: 08/16/2021 CLINICAL DATA:  Weakness, shortness of breath EXAM: PORTABLE CHEST 1 VIEW COMPARISON:  07/28/2021 FINDINGS: Single frontal view of the chest demonstrates an unremarkable cardiac silhouette. Atherosclerosis of the thoracic aorta. Stable chronic interstitial scarring without airspace disease, effusion, or pneumothorax.  No acute bony abnormalities. IMPRESSION: 1. Chronic scarring, no acute process. Electronically Signed   By: Randa Ngo M.D.   On: 08/16/2021 15:54   DG Thoracic Spine W/Swimmers  Result Date: 07/28/2021 CLINICAL DATA:  Back pain.  Mid back pain and weakness. EXAM: THORACIC SPINE - 3 VIEWS COMPARISON:  No prior thoracic spine exams. Cervical spine radiograph 03/15/2021. Lower thoracic spine reformats from abdominal CT 03/23/2018 FINDINGS: Exaggerated upper thoracic kyphosis. Known T11 compression fracture is grossly stable. There additional mild compression fractures of tentatively number T4, T5, and T9. Multilevel anterior spurring with slight diffuse disc space narrowing. No definite paravertebral soft tissue abnormalities. IMPRESSION: 1. Chronic compression deformities at T11, also T4 and T5, stable from prior imaging. 2. Mild T9 compression fracture with no comparisons to assess for acuity. 3. Diffuse degenerative disc disease. Electronically Signed   By: Keith Rake M.D.   On: 07/28/2021 18:55   DG Chest 2 View  Result Date: 07/28/2021 CLINICAL DATA:  Chest pain, mid back pain.  Weakness. EXAM: CHEST - 2 VIEW COMPARISON:  Chest radiograph 12/27/2019. FINDINGS: The lungs are hyperinflated. Mild central bronchial thickening. The heart is normal in size. Aortic tortuosity and atherosclerosis, similar in appearance to prior exam. No evidence of focal airspace disease, pleural effusion, or pneumothorax. No pulmonary mass. Thoracic spine assessed on concurrent thoracic spine exam. IMPRESSION: Hyperinflation and central bronchial thickening, can be seen with COPD, bronchitis or asthma. No localizing pulmonary process. Electronically Signed   By: Keith Rake M.D.   On: 07/28/2021 18:51   DG Lumbar Spine Complete  Result Date: 07/28/2021 CLINICAL DATA:  Back pain EXAM: LUMBAR SPINE - COMPLETE 4+ VIEW COMPARISON:  CT abdomen and pelvis 03/23/2018 FINDINGS: Mild levocurvature of the lumbar spine.  Lumbar vertebral body height are preserved without evidence of fracture. Minimal grade 1 retrolisthesis of L2 on L3 and L4 on L5. No spondylolysis visualized. Mild to moderate intervertebral disc space narrowing at L1-L2 and L4-L5. Facet arthropathy. Calcified plaques in the abdominal aorta which measures up to 4 cm in diameter, appears increased since previous CT. IMPRESSION: 1. Degenerative changes of the lumbar spine as described. 2. Distal abdominal aortic aneurysm appears to measure 4 cm in diameter which would be increased since previous CT. Follow-up ultrasound or cross-sectional imaging recommended. Electronically Signed   By: Ofilia Neas M.D.   On: 07/28/2021 15:10      Subjective: Feels well. Would like to go home.   Discharge Exam: Vitals:   08/19/21 0327 08/19/21 0328  BP: 136/67   Pulse: Marland Kitchen)  58   Resp: 16 16  Temp: 97.6 F (36.4 C)   SpO2: 94%    Vitals:   08/18/21 2015 08/18/21 2350 08/19/21 0327 08/19/21 0328  BP: 135/76 (!) 150/60 136/67   Pulse: (!) 51 (!) 46 (!) 58   Resp: '17 14 16 16  '$ Temp: 98 F (36.7 C) 97.9 F (36.6 C) 97.6 F (36.4 C)   TempSrc: Oral Oral Oral   SpO2: 98% 97% 94%   Weight:      Height:        General: Pt is alert, awake, not in acute distress Cardiovascular: RRR, S1/S2 +, no rubs, no gallops Respiratory: CTA bilaterally, no wheezing, no rhonchi Abdominal: Soft, NT, ND, bowel sounds + Extremities: no edema, no cyanosis    The results of significant diagnostics from this hospitalization (including imaging, microbiology, ancillary and laboratory) are listed below for reference.     Microbiology: Recent Results (from the past 240 hour(s))  Blood Culture (routine x 2)     Status: None   Collection Time: 08/16/21  4:11 PM   Specimen: Right Antecubital; Blood  Result Value Ref Range Status   Specimen Description RIGHT ANTECUBITAL  Final   Special Requests   Final    BOTTLES DRAWN AEROBIC AND ANAEROBIC Blood Culture results may  not be optimal due to an inadequate volume of blood received in culture bottles   Culture   Final    NO GROWTH 5 DAYS Performed at [facility], [address], [place], [place] [address]    Report Status 08/21/2021 FINAL  Final  Blood Culture (routine x 2)     Status: None   Collection Time: 08/16/21  4:59 PM   Specimen: BLOOD RIGHT FOREARM  Result Value Ref Range Status   Specimen Description BLOOD RIGHT FOREARM  Final   Special Requests   Final    BOTTLES DRAWN AEROBIC AND ANAEROBIC Blood Culture results may not be optimal due to an excessive volume of blood received in culture bottles   Culture   Final    NO GROWTH 5 DAYS Performed at [facility], [address] [place], [place] [address]    Report Status 08/21/2021 FINAL  Final  MRSA Next Gen by PCR, Nasal     Status: None   Collection Time: 08/17/21 12:32 AM   Specimen: Nasal Mucosa; Nasal Swab  Result Value Ref Range Status   MRSA by PCR Next Gen NOT DETECTED NOT DETECTED Final    Comment: (NOTE) The GeneXpert MRSA Assay (FDA approved for NASAL specimens only), is one component of a comprehensive MRSA colonization surveillance program. It is not intended to diagnose MRSA infection nor to guide or monitor treatment for MRSA infections. Test performance is not FDA approved in patients less than 66 years old. Performed at [facility], [place] [address], [place], [place] [address]      Labs: BNP (last 3 results) Recent Labs    08/17/21 0132  BNP 93.8   Basic Metabolic Panel: Recent Labs  Lab 08/16/21 1539 08/17/21 0132 08/18/21 0050  NA 132* 134* 135  K 3.9 3.7 4.1  CL 108 108 112*  CO2 20* 20* 18*  GLUCOSE 93 97 89  BUN 31* 27* 27*  CREATININE 1.66* 1.44* 1.23  CALCIUM 8.7* 8.6* 8.8*  MG 1.8  --   --   PHOS 3.2  --   --    Liver Function Tests: Recent Labs  Lab 08/16/21 1539  AST 20  ALT  16  ALKPHOS 81  BILITOT 0.9  PROT 6.9  ALBUMIN 4.0   No results for input(s): "LIPASE", "AMYLASE" in the  last 168 hours. No results for input(s): "AMMONIA" in the last 168 hours. CBC: Recent Labs  Lab 08/16/21 1539 08/17/21 0132 08/18/21 0050  WBC 6.1 5.4 5.1  NEUTROABS 3.8  --   --   HGB 11.4* 9.8* 10.5*  HCT 34.2* 30.1* 30.4*  MCV 93.7 95.0 91.3  PLT 148* 128* 136*   Cardiac Enzymes: No results for input(s): "CKTOTAL", "CKMB", "CKMBINDEX", "TROPONINI" in the last 168 hours. BNP: Invalid input(s): "POCBNP" CBG: No results for input(s): "GLUCAP" in the last 168 hours. D-Dimer No results for input(s): "DDIMER" in the last 72 hours. Hgb A1c No results for input(s): "HGBA1C" in the last 72 hours. Lipid Profile No results for input(s): "CHOL", "HDL", "LDLCALC", "TRIG", "CHOLHDL", "LDLDIRECT" in the last 72 hours.  Thyroid function studies No results for input(s): "TSH", "T4TOTAL", "T3FREE", "THYROIDAB" in the last 72 hours.  Invalid input(s): "FREET3"  Anemia work up No results for input(s): "VITAMINB12", "FOLATE", "FERRITIN", "TIBC", "IRON", "RETICCTPCT" in the last 72 hours.  Urinalysis    Component Value Date/Time   COLORURINE YELLOW 08/16/2021 1800   APPEARANCEUR CLEAR 08/16/2021 1800   APPEARANCEUR Clear 09/13/2019 1337   LABSPEC 1.020 08/16/2021 1800   PHURINE 5.0 08/16/2021 1800   GLUCOSEU NEGATIVE 08/16/2021 1800   HGBUR NEGATIVE 08/16/2021 1800   BILIRUBINUR NEGATIVE 08/16/2021 1800   BILIRUBINUR Negative 09/13/2019 1337   KETONESUR NEGATIVE 08/16/2021 1800   PROTEINUR NEGATIVE 08/16/2021 1800   UROBILINOGEN 0.2 11/05/2013 1908   NITRITE NEGATIVE 08/16/2021 1800   LEUKOCYTESUR NEGATIVE 08/16/2021 1800   Sepsis Labs Recent Labs  Lab 08/16/21 1539 08/17/21 0132 08/18/21 0050  WBC 6.1 5.4 5.1   Microbiology Recent Results (from the past 240 hour(s))  Blood Culture (routine x 2)     Status: None   Collection Time: 08/16/21  4:11 PM   Specimen: Right Antecubital; Blood  Result Value Ref Range Status   Specimen Description RIGHT ANTECUBITAL  Final    Special Requests   Final    BOTTLES DRAWN AEROBIC AND ANAEROBIC Blood Culture results may not be optimal due to an inadequate volume of blood received in culture bottles   Culture   Final    NO GROWTH 5 DAYS Performed at [facility], [address] [place], [place] [address]    Report Status 08/21/2021 FINAL  Final  Blood Culture (routine x 2)     Status: None   Collection Time: 08/16/21  4:59 PM   Specimen: BLOOD RIGHT FOREARM  Result Value Ref Range Status   Specimen Description BLOOD RIGHT FOREARM  Final   Special Requests   Final    BOTTLES DRAWN AEROBIC AND ANAEROBIC Blood Culture results may not be optimal due to an excessive volume of blood received in culture bottles   Culture   Final    NO GROWTH 5 DAYS Performed at [facility], [address] [place], [place] [address]    Report Status 08/21/2021 FINAL  Final  MRSA Next Gen by PCR, Nasal     Status: None   Collection Time: 08/17/21 12:32 AM   Specimen: Nasal Mucosa; Nasal Swab  Result Value Ref Range Status   MRSA by PCR Next Gen NOT DETECTED NOT DETECTED Final    Comment: (NOTE) The GeneXpert MRSA Assay (FDA approved for NASAL specimens only), is one component of a comprehensive MRSA colonization surveillance program. It  is not intended to diagnose MRSA infection nor to guide or monitor treatment for MRSA infections. Test performance is not FDA approved in patients less than 26 years old. Performed at [facility], [place] [address] [place], [place] [address]      Time coordinating discharge: Over 30 minutes  SIGNED:   Nolberto Hanlon, MD  [facility] 08/22/2021, 2:57 PM Pager   If 7PM-7AM, please contact night-coverage [email or web address] Password [email or web address]

## 2021-08-21 LAB — CULTURE, BLOOD (ROUTINE X 2)
Culture: NO GROWTH
Culture: NO GROWTH

## 2021-08-24 ENCOUNTER — Ambulatory Visit: Payer: Medicare Other | Admitting: Physician Assistant

## 2021-08-24 VITALS — BP 138/74 | HR 77

## 2021-08-24 DIAGNOSIS — R350 Frequency of micturition: Secondary | ICD-10-CM | POA: Diagnosis not present

## 2021-08-24 DIAGNOSIS — C61 Malignant neoplasm of prostate: Secondary | ICD-10-CM

## 2021-08-24 LAB — BLADDER SCAN AMB NON-IMAGING: Scan Result: 32

## 2021-08-24 MED ORDER — MIRABEGRON ER 25 MG PO TB24: 25.0000 mg | ORAL_TABLET | Freq: Every day | ORAL | 0 refills | Status: DC

## 2021-08-24 NOTE — Progress Notes (Signed)
Assessment: 1. Urine frequency - Urinalysis, Routine w reflex microscopic - BLADDER SCAN AMB NON-IMAGING  2. Malignant neoplasm of prostate (HCC) - PSA; Future    Plan: Treatment options discussed at length with the patient.  A trial of Myrbetriq 25 mg samples given and the patient agrees to follow-up in 4 to 6 weeks for PVR and UA as well as a PSA prior to his office visit.  Discussed the importance of regular PSA follow-up to ensure his prostate cancer is not progressing, especially as he is elected not to proceed with ADT therapy.  Chief Complaint: No chief complaint on file.   HPI: Alex Lara. is a 81 y.o. male with history of prostate cancer who presents for evaluation of recent onset of urinary frequency and nocturia 5-6 times during the night.  He denies dysuria, burning, urgency, incontinence, need to strain, gross hematuria.  He denies recent change in medications.  He does admit to recent hospital admission on 08/16/2021 for evaluation of weakness and was treated for acute kidney injury with a creatinine of 1.66, thrombocytopenia, constipation, COPD. Patient discharged on 08/22/2021 states he is feeling much better except for this urinary frequency.  Patient's last evaluation for PSA in follow-up of his prostate cancer was in September 2021.  He has had no additional treatment with ADT since that time.  Last PSA 09/13/19 0.1  UA = clear PVR = 32 mL IPSS = 20, quality-of-life = 5  09/13/19 Alex Lara is a 81yo with a hx of prostate cancer. He was last seen over 1 year ago. He finished IMRT 1 year ago. He never returned for ADT. He only received 2 doses of firmagon. No recent PSA.  The has issues getting an erection which started when he received firmagon. He had a CVA in 01/2019. He was prescibed nitroglycerin but has not taken it in over 1 year.   Portions of the above documentation were copied from a prior visit for review purposes only.  Allergies: Allergies  Allergen  Reactions   Codeine Nausea And Vomiting and Other (See Comments)    "go fuzzy"    PMH: Past Medical History:  Diagnosis Date   Anxiety and depression    Arteriosclerotic cardiovascular disease (ASCVD)    06/2008: DES to the RCA and LAD   Blood clotting disorder (HCC)    Carpal tunnel syndrome    Chronic anticoagulation    Deep vein thrombosis (HCC)    Degenerative joint disease    Gastroesophageal reflux disease    Hyperlipidemia    Hypertension    Prostate cancer ([place])    Stroke ([place])    Tobacco abuse     PSH: Past Surgical History:  Procedure Laterality Date   COLONOSCOPY  ?  Date   COLONOSCOPY WITH PROPOFOL N/A 07/14/2021   Procedure: COLONOSCOPY WITH PROPOFOL;  Surgeon: Eloise Harman, DO;  Location: [facility];  Service: Endoscopy;  Laterality: N/A;  8:15am, asa 3   INGUINAL HERNIA REPAIR     ORIF-unknown fracture     POLYPECTOMY  07/14/2021   Procedure: POLYPECTOMY;  Surgeon: Eloise Harman, DO;  Location: [facility];  Service: Endoscopy;;   PROSTATE BIOPSY     ROTATOR CUFF REPAIR     TOTAL HIP ARTHROPLASTY  2004   Left    SH: Social History   Tobacco Use   Smoking status: Every Day    Packs/day: 1.50    Years: 55.00    Total pack years:  82.50    Types: Cigarettes   Smokeless tobacco: Never  Vaping Use   Vaping Use: Never used  Substance Use Topics   Alcohol use: Yes    Alcohol/week: 0.0 standard drinks of alcohol    Comment: Patient states he is cutting down. 2 drinks today. none yesterday. History of ETOH abuse   Drug use: No    ROS: All other review of systems were reviewed and are negative except what is noted above in HPI  PE: BP 138/74   Pulse 77  GENERAL APPEARANCE:  Well appearing, well developed, well nourished, NAD HEENT:  Atraumatic, normocephalic NECK:  Supple. Trachea midline ABDOMEN:  Soft, non-tender, no masses EXTREMITIES:  Moves all extremities well, without clubbing, cyanosis, or edema NEUROLOGIC:  Alert and oriented  x 3, normal gait, CN II-XII grossly intact MENTAL STATUS:  appropriate BACK:  Non-tender to palpation, No CVAT SKIN:  Warm, dry, and intact   Results: Laboratory Data: Lab Results  Component Value Date   WBC 5.1 08/18/2021   HGB 10.5 (L) 08/18/2021   HCT 30.4 (L) 08/18/2021   MCV 91.3 08/18/2021   PLT 136 (L) 08/18/2021    Lab Results  Component Value Date   CREATININE 1.23 08/18/2021    No results found for: "PSA"  No results found for: "TESTOSTERONE"  Lab Results  Component Value Date   HGBA1C 5.5 01/31/2019    Urinalysis    Component Value Date/Time   COLORURINE YELLOW 08/16/2021 1800   APPEARANCEUR CLEAR 08/16/2021 1800   APPEARANCEUR Clear 09/13/2019 1337   LABSPEC 1.020 08/16/2021 1800   PHURINE 5.0 08/16/2021 1800   GLUCOSEU NEGATIVE 08/16/2021 1800   HGBUR NEGATIVE 08/16/2021 1800   BILIRUBINUR NEGATIVE 08/16/2021 1800   BILIRUBINUR Negative 09/13/2019 1337   KETONESUR NEGATIVE 08/16/2021 1800   PROTEINUR NEGATIVE 08/16/2021 1800   UROBILINOGEN 0.2 11/05/2013 1908   NITRITE NEGATIVE 08/16/2021 1800   LEUKOCYTESUR NEGATIVE 08/16/2021 1800    Lab Results  Component Value Date   LABMICR Comment 09/13/2019    Pertinent Imaging: No results found for this or any previous visit.  No results found for this or any previous visit.  No results found for this or any previous visit.  No results found for this or any previous visit.  Results for orders placed during the hospital encounter of 10/21/10  US Renal  Narrative *RADIOLOGY REPORT*  Clinical Data: Follow-up renal cyst.  Right flank pain  RENAL/URINARY TRACT ULTRASOUND COMPLETE  Comparison:  CT 07/31/2009  Findings:  Right Kidney:  12.6 cm in length.  Negative for obstruction.  Renal cortex is normal.  26 x 22 mm simple cyst on the right lower pole, similar to the prior CT.  Left Kidney:  11.0 cm in length.  Negative for obstruction.  Renal cortex is normal.  Negative for mass or  cyst.  Bladder:  Negative  IMPRESSION: Right lower pole simple cysts.  Otherwise negative.  Original Report Authenticated By: Truett Perna, M.D.  No results found for this or any previous visit.  No results found for this or any previous visit.  No results found for this or any previous visit.  Results for orders placed or performed in visit on 08/24/21 (from the past 24 hour(s))  BLADDER SCAN AMB NON-IMAGING   Collection Time: 08/24/21  3:36 PM  Result Value Ref Range   Scan Result 32

## 2021-08-26 DIAGNOSIS — R000 Tachycardia, unspecified: Secondary | ICD-10-CM | POA: Diagnosis not present

## 2021-08-26 DIAGNOSIS — E860 Dehydration: Secondary | ICD-10-CM | POA: Diagnosis not present

## 2021-08-26 DIAGNOSIS — I714 Abdominal aortic aneurysm, without rupture, unspecified: Secondary | ICD-10-CM | POA: Diagnosis not present

## 2021-08-26 DIAGNOSIS — D649 Anemia, unspecified: Secondary | ICD-10-CM | POA: Diagnosis not present

## 2021-08-26 LAB — URINALYSIS, ROUTINE W REFLEX MICROSCOPIC
Bilirubin, UA: NEGATIVE
Glucose, UA: NEGATIVE
Ketones, UA: NEGATIVE
Leukocytes,UA: NEGATIVE
Nitrite, UA: NEGATIVE
RBC, UA: NEGATIVE
Specific Gravity, UA: 1.015 (ref 1.005–1.030)
Urobilinogen, Ur: 0.2 mg/dL (ref 0.2–1.0)
pH, UA: 5.5 (ref 5.0–7.5)

## 2021-09-07 ENCOUNTER — Telehealth: Payer: Self-pay

## 2021-09-07 NOTE — Telephone Encounter (Signed)
Patient called to let nurse know the medication prescribed for frequent urination is not helping.  Wanting to know if something else can be called into pharmacy.  Please let pt know if Rx can be called in.  Call back:  [phone] (H)   Thanks, Helene Kelp

## 2021-09-15 ENCOUNTER — Ambulatory Visit: Payer: Medicare Other | Admitting: Cardiology

## 2021-09-16 ENCOUNTER — Ambulatory Visit: Payer: Medicare Other | Admitting: Gastroenterology

## 2021-09-21 ENCOUNTER — Ambulatory Visit: Payer: Medicare Other | Admitting: Cardiology

## 2021-09-28 ENCOUNTER — Ambulatory Visit: Payer: Medicare Other | Admitting: Cardiology

## 2021-09-30 ENCOUNTER — Ambulatory Visit (INDEPENDENT_AMBULATORY_CARE_PROVIDER_SITE_OTHER): Payer: Medicare Other | Admitting: Gastroenterology

## 2021-09-30 ENCOUNTER — Encounter: Payer: Self-pay | Admitting: Gastroenterology

## 2021-09-30 DIAGNOSIS — K641 Second degree hemorrhoids: Secondary | ICD-10-CM | POA: Diagnosis not present

## 2021-09-30 DIAGNOSIS — K649 Unspecified hemorrhoids: Secondary | ICD-10-CM | POA: Insufficient documentation

## 2021-09-30 NOTE — Patient Instructions (Signed)
  Please avoid straining.  You should limit your toilet time to 2-3 minutes at the most.   I recommend Benefiber 2 teaspoons each morning in the beverage of your choice!  Please call me with any concerns or issues!  I will see you in follow-up for additional banding in several weeks.  I enjoyed seeing you again today! As you know, I value our relationship and want to provide genuine, compassionate, and quality care. I welcome your feedback. If you receive a survey regarding your visit,  I greatly appreciate you taking time to fill this out. See you next time!  Carlye Panameno W. Yeraldi Fidler, PhD, ANP-BC [facility]         

## 2021-09-30 NOTE — Progress Notes (Signed)
      [place] BANDING PROCEDURE NOTE  Alex Lara. is a 81 y.o. male presenting today for consideration of hemorrhoid banding. Last colonoscopy July 2023: non-bleeding internal hemorrhoids, sigmoid diverticulosis, one 5 mm polyp s/p removal. Sessile serrated adenoma. 5 year surveillance. Felt to have a fissure at last visit and prescribed [facility] hemorrhoid cream with Nitro. Here to discuss banding. Rectal pain resolved.   The patient presents with symptomatic grade 2 hemorrhoids, unresponsive to maximal medical therapy, requesting rubber band ligation of his hemorrhoidal disease. All risks, benefits, and alternative forms of therapy were described and informed consent was obtained.  The decision was made to band the left lateral internal hemorrhoid, and the [place] was used to perform band ligation without complication. Digital anorectal examination was then performed to assure proper positioning of the band, and to adjust the banded tissue as required. The patient was discharged home without pain or other issues. Dietary and behavioral recommendations were given, along with follow-up instructions. The patient will return in several weeks for followup and possible additional banding as required.  No complications were encountered and the patient tolerated the procedure well.   Annitta Needs, PhD, ANP-BC [facility]

## 2021-10-06 ENCOUNTER — Ambulatory Visit: Payer: Medicare Other | Admitting: Urology

## 2021-10-18 ENCOUNTER — Other Ambulatory Visit (HOSPITAL_COMMUNITY): Payer: Self-pay | Admitting: Internal Medicine

## 2021-10-18 ENCOUNTER — Ambulatory Visit (HOSPITAL_COMMUNITY)
Admission: RE | Admit: 2021-10-18 | Discharge: 2021-10-18 | Disposition: A | Discharge status: Home / Self Care | Payer: Medicare Other | Source: Ambulatory Visit | Attending: Internal Medicine | Admitting: Internal Medicine

## 2021-10-18 DIAGNOSIS — I700 Atherosclerosis of aorta: Secondary | ICD-10-CM | POA: Diagnosis not present

## 2021-10-18 DIAGNOSIS — R0781 Pleurodynia: Secondary | ICD-10-CM | POA: Diagnosis not present

## 2021-10-21 ENCOUNTER — Encounter: Payer: Self-pay | Admitting: Gastroenterology

## 2021-10-21 ENCOUNTER — Ambulatory Visit (INDEPENDENT_AMBULATORY_CARE_PROVIDER_SITE_OTHER): Payer: Medicare Other | Admitting: Gastroenterology

## 2021-10-21 VITALS — BP 131/75 | HR 69 | Temp 98.5°F | Ht 72.0 in | Wt 174.8 lb

## 2021-10-21 DIAGNOSIS — K641 Second degree hemorrhoids: Secondary | ICD-10-CM

## 2021-10-21 NOTE — Patient Instructions (Signed)
  Please avoid straining.  You should limit your toilet time to 2-3 minutes at the most.   I recommend Benefiber 2 teaspoons each morning in the beverage of your choice!  Start taking docusate sodium (Colace) twice a day. This helps soften your stool. Let me know if this is not helpful!  Please call me with any concerns or issues!  I will see you in follow-up for additional banding in several weeks.      I enjoyed seeing you again today! As you know, I value our relationship and want to provide genuine, compassionate, and quality care. I welcome your feedback. If you receive a survey regarding your visit,  I greatly appreciate you taking time to fill this out. See you next time!  Annitta Needs, PhD, ANP-BC [facility]

## 2021-10-21 NOTE — Progress Notes (Signed)
    Alex Lara  Alex Lara. is an 81 y.o. male presenting today for consideration of hemorrhoid banding. Last colonoscopy July 2023: non-bleeding internal hemorrhoids, sigmoid diverticulosis, one 5 mm polyp s/p removal. Sessile serrated adenoma. 5 year surveillance. Felt to have a fissure several months ago and prescribed [facility] hemorrhoid cream with Nitro. Rectal pain resolved. He has completed left lateral banding.   The patient presents with symptomatic grade 2 hemorrhoids, unresponsive to maximal medical therapy, requesting rubber band ligation of his hemorrhoidal disease. All risks, benefits, and alternative forms of therapy were described and informed consent was obtained.  The decision was made to band the right posterior internal hemorrhoid, and the [place] was used to perform band ligation without complication. Digital anorectal examination was then performed to assure proper positioning of the band, and to adjust the banded tissue as required. The patient was discharged home without pain or other issues. Dietary and behavioral recommendations were given, along with follow-up instructions. The patient will return in several weeks for followup and possible additional banding as required.  No complications were encountered and the patient tolerated the procedure well.   Annitta Needs, PhD, ANP-BC [facility]

## 2021-11-04 ENCOUNTER — Ambulatory Visit (INDEPENDENT_AMBULATORY_CARE_PROVIDER_SITE_OTHER): Payer: Medicare Other | Admitting: Gastroenterology

## 2021-11-04 ENCOUNTER — Encounter: Payer: Self-pay | Admitting: Gastroenterology

## 2021-11-04 VITALS — BP 149/66 | HR 64 | Temp 97.6°F | Ht 73.0 in | Wt 176.0 lb

## 2021-11-04 DIAGNOSIS — K641 Second degree hemorrhoids: Secondary | ICD-10-CM | POA: Diagnosis not present

## 2021-11-04 MED ORDER — HYDROCORTISONE (PERIANAL) 2.5 % EX CREA: 1.0000 | TOPICAL_CREAM | Freq: Two times a day (BID) | CUTANEOUS | 1 refills | Status: DC

## 2021-11-04 NOTE — Patient Instructions (Signed)
  Please avoid straining.  You should limit your toilet time to 2-3 minutes at the most.   I recommend Benefiber 2 teaspoons each morning in the beverage of your choice! Drink at least 6-8 glasses of water a day!  Please call me with any concerns or issues!  I will see you back as needed!  I sent in Anusol cream to use as needed for itching.  I enjoyed seeing you again today! As you know, I value our relationship and want to provide genuine, compassionate, and quality care. I welcome your feedback. If you receive a survey regarding your visit,  I greatly appreciate you taking time to fill this out. See you next time!  Annitta Needs, PhD, ANP-BC [facility]

## 2021-11-04 NOTE — Progress Notes (Signed)
    [place] BANDING PROCEDURE NOTE  Alex Lara. is a 81 y.o. male presenting today for consideration of hemorrhoid banding. Last colonoscopy July 2023: non-bleeding internal hemorrhoids, sigmoid diverticulosis, one 5 mm polyp s/p removal. Sessile serrated adenoma. 5 year surveillance. Felt to have a fissure several months ago and prescribed [facility] hemorrhoid cream with Nitro. Rectal pain resolved. He has completed left lateral and right posterior banding.    The patient presents with symptomatic grade 2 hemorrhoids, unresponsive to maximal medical therapy, requesting rubber band ligation of his hemorrhoidal disease. All risks, benefits, and alternative forms of therapy were described and informed consent was obtained.   The decision was made to band the right anterior internal hemorrhoid, and the [place] was used to perform band ligation without complication. Digital anorectal examination was then performed to assure proper positioning of the band, and to adjust the banded tissue as required. The patient was discharged home without pain or other issues. Dietary and behavioral recommendations were given, along with follow-up instructions. The patient will return as needed. I sent in Anusol cream for itching prn.   No complications were encountered and the patient tolerated the procedure well.   Annitta Needs, PhD, ANP-BC [facility]

## 2021-11-11 DIAGNOSIS — R1032 Left lower quadrant pain: Secondary | ICD-10-CM | POA: Diagnosis not present

## 2021-11-11 DIAGNOSIS — Z23 Encounter for immunization: Secondary | ICD-10-CM | POA: Diagnosis not present

## 2021-11-11 DIAGNOSIS — J449 Chronic obstructive pulmonary disease, unspecified: Secondary | ICD-10-CM | POA: Diagnosis not present

## 2021-11-15 ENCOUNTER — Ambulatory Visit (INDEPENDENT_AMBULATORY_CARE_PROVIDER_SITE_OTHER): Payer: Medicare Other

## 2021-11-15 ENCOUNTER — Ambulatory Visit: Payer: Medicare Other | Admitting: Orthopedic Surgery

## 2021-11-15 VITALS — BP 132/77 | HR 70 | Ht 72.0 in | Wt 175.0 lb

## 2021-11-15 DIAGNOSIS — M25512 Pain in left shoulder: Secondary | ICD-10-CM

## 2021-11-15 DIAGNOSIS — M25511 Pain in right shoulder: Secondary | ICD-10-CM

## 2021-11-15 DIAGNOSIS — G8929 Other chronic pain: Secondary | ICD-10-CM

## 2021-11-15 NOTE — Progress Notes (Signed)
Chief Complaint  Patient presents with   Left Shoulder - Pain    No injury hurts all the time , request injection   Right Shoulder - Pain    No injury hurts all the time request injection    Procedure injection right shoulder subacromial joint and left shoulder subacromial joint   procedure note the subacromial injection shoulder RIGHT  Verbal consent was obtained to inject the  RIGHT   Shoulder  Timeout was completed to confirm the injection site is a subacromial space of the  RIGHT  shoulder   Medication used Depo-Medrol 40 mg and lidocaine 1% 3 cc  Anesthesia was provided by ethyl chloride  The injection was performed in the RIGHT  posterior subacromial space. After pinning the skin with alcohol and anesthetized the skin with ethyl chloride the subacromial space was injected using a 20-gauge needle. There were no complications  Sterile dressing was applied.    Procedure note the subacromial injection shoulder left   Verbal consent was obtained to inject the  Left   Shoulder  Timeout was completed to confirm the injection site is a subacromial space of the  left  shoulder  Medication used Depo-Medrol 40 mg and lidocaine 1% 3 cc  Anesthesia was provided by ethyl chloride  The injection was performed in the left  posterior subacromial space. After pinning the skin with alcohol and anesthetized the skin with ethyl chloride the subacromial space was injected using a 20-gauge needle. There were no complications  Sterile dressing was applied.

## 2021-11-25 ENCOUNTER — Ambulatory Visit: Payer: Medicare Other | Admitting: Gastroenterology

## 2021-12-08 DIAGNOSIS — M79675 Pain in left toe(s): Secondary | ICD-10-CM | POA: Diagnosis not present

## 2021-12-08 DIAGNOSIS — I739 Peripheral vascular disease, unspecified: Secondary | ICD-10-CM | POA: Diagnosis not present

## 2021-12-08 DIAGNOSIS — M79672 Pain in left foot: Secondary | ICD-10-CM | POA: Diagnosis not present

## 2021-12-08 DIAGNOSIS — L110 Acquired keratosis follicularis: Secondary | ICD-10-CM | POA: Diagnosis not present

## 2021-12-08 DIAGNOSIS — M79671 Pain in right foot: Secondary | ICD-10-CM | POA: Diagnosis not present

## 2021-12-08 DIAGNOSIS — M79674 Pain in right toe(s): Secondary | ICD-10-CM | POA: Diagnosis not present

## 2022-01-09 DIAGNOSIS — K219 Gastro-esophageal reflux disease without esophagitis: Secondary | ICD-10-CM | POA: Diagnosis not present

## 2022-01-09 DIAGNOSIS — I2510 Atherosclerotic heart disease of native coronary artery without angina pectoris: Secondary | ICD-10-CM | POA: Diagnosis not present

## 2022-01-18 ENCOUNTER — Other Ambulatory Visit (HOSPITAL_COMMUNITY): Payer: Self-pay | Admitting: Internal Medicine

## 2022-01-18 ENCOUNTER — Ambulatory Visit (HOSPITAL_COMMUNITY)
Admission: RE | Admit: 2022-01-18 | Discharge: 2022-01-18 | Disposition: A | Discharge status: Home / Self Care | Payer: Medicare HMO | Source: Ambulatory Visit | Attending: Internal Medicine | Admitting: Internal Medicine

## 2022-01-18 DIAGNOSIS — R059 Cough, unspecified: Secondary | ICD-10-CM | POA: Diagnosis not present

## 2022-01-18 DIAGNOSIS — R079 Chest pain, unspecified: Secondary | ICD-10-CM | POA: Diagnosis not present

## 2022-01-18 DIAGNOSIS — R197 Diarrhea, unspecified: Secondary | ICD-10-CM | POA: Diagnosis not present

## 2022-01-18 DIAGNOSIS — J3489 Other specified disorders of nose and nasal sinuses: Secondary | ICD-10-CM | POA: Diagnosis not present

## 2022-01-20 ENCOUNTER — Ambulatory Visit (INDEPENDENT_AMBULATORY_CARE_PROVIDER_SITE_OTHER): Payer: Self-pay | Admitting: Orthopedic Surgery

## 2022-01-20 ENCOUNTER — Encounter: Payer: Self-pay | Admitting: Orthopedic Surgery

## 2022-01-20 DIAGNOSIS — M25512 Pain in left shoulder: Secondary | ICD-10-CM

## 2022-01-20 DIAGNOSIS — G8929 Other chronic pain: Secondary | ICD-10-CM

## 2022-01-20 DIAGNOSIS — M25511 Pain in right shoulder: Secondary | ICD-10-CM

## 2022-01-20 MED ORDER — METHYLPREDNISOLONE ACETATE 40 MG/ML IJ SUSP
40.0000 mg | Freq: Once | INTRAMUSCULAR | Status: AC
  Administered 2022-01-20: 40 mg via INTRA_ARTICULAR

## 2022-01-20 NOTE — Addendum Note (Signed)
Addended byCandice Camp on: 01/20/2022 03:59 PM   Modules accepted: Orders

## 2022-01-20 NOTE — Patient Instructions (Signed)

## 2022-01-20 NOTE — Progress Notes (Signed)
Chief Complaint  Patient presents with   Injections    B shoulders   Procedure injection right shoulder subacromial joint and left shoulder subacromial joint   procedure note the subacromial injection shoulder RIGHT  Verbal consent was obtained to inject the  RIGHT   Shoulder  Timeout was completed to confirm the injection site is a subacromial space of the  RIGHT  shoulder   Medication used Depo-Medrol 40 mg and lidocaine 1% 3 cc  Anesthesia was provided by ethyl chloride  The injection was performed in the RIGHT  posterior subacromial space. After pinning the skin with alcohol and anesthetized the skin with ethyl chloride the subacromial space was injected using a 20-gauge needle. There were no complications  Sterile dressing was applied.    Procedure note the subacromial injection shoulder left   Verbal consent was obtained to inject the  Left   Shoulder  Timeout was completed to confirm the injection site is a subacromial space of the  left  shoulder  Medication used Depo-Medrol 40 mg and lidocaine 1% 3 cc  Anesthesia was provided by ethyl chloride  The injection was performed in the left  posterior subacromial space. After pinning the skin with alcohol and anesthetized the skin with ethyl chloride the subacromial space was injected using a 20-gauge needle. There were no complications  Sterile dressing was applied.

## 2022-01-21 DIAGNOSIS — K591 Functional diarrhea: Secondary | ICD-10-CM | POA: Diagnosis not present

## 2022-01-21 DIAGNOSIS — K529 Noninfective gastroenteritis and colitis, unspecified: Secondary | ICD-10-CM | POA: Diagnosis not present

## 2022-02-17 DIAGNOSIS — H2513 Age-related nuclear cataract, bilateral: Secondary | ICD-10-CM | POA: Diagnosis not present

## 2022-03-03 DIAGNOSIS — H2511 Age-related nuclear cataract, right eye: Secondary | ICD-10-CM | POA: Diagnosis not present

## 2022-03-03 DIAGNOSIS — H01002 Unspecified blepharitis right lower eyelid: Secondary | ICD-10-CM | POA: Diagnosis not present

## 2022-03-03 DIAGNOSIS — H25812 Combined forms of age-related cataract, left eye: Secondary | ICD-10-CM | POA: Diagnosis not present

## 2022-03-03 DIAGNOSIS — H01001 Unspecified blepharitis right upper eyelid: Secondary | ICD-10-CM | POA: Diagnosis not present

## 2022-03-03 DIAGNOSIS — J449 Chronic obstructive pulmonary disease, unspecified: Secondary | ICD-10-CM | POA: Diagnosis not present

## 2022-03-08 ENCOUNTER — Ambulatory Visit: Payer: Medicare HMO | Admitting: Gastroenterology

## 2022-03-08 ENCOUNTER — Encounter: Payer: Self-pay | Admitting: Gastroenterology

## 2022-03-10 ENCOUNTER — Encounter: Payer: Self-pay | Admitting: Radiology

## 2022-03-28 ENCOUNTER — Encounter (HOSPITAL_COMMUNITY): Payer: Self-pay

## 2022-03-28 ENCOUNTER — Encounter (HOSPITAL_COMMUNITY)
Admission: RE | Admit: 2022-03-28 | Discharge: 2022-03-28 | Disposition: A | Discharge status: Home / Self Care | Payer: Medicare HMO | Source: Ambulatory Visit | Attending: Ophthalmology | Admitting: Ophthalmology

## 2022-03-28 DIAGNOSIS — H2511 Age-related nuclear cataract, right eye: Secondary | ICD-10-CM | POA: Diagnosis not present

## 2022-03-28 HISTORY — DX: Acute myocardial infarction, unspecified: I21.9

## 2022-03-30 ENCOUNTER — Ambulatory Visit: Payer: Medicare HMO | Admitting: Gastroenterology

## 2022-03-31 NOTE — H&P (Signed)
Surgical History & Physical  Patient Name: Jamorion Bingaman DOB: 1940-12-09  Surgery: Cataract extraction with intraocular lens implant phacoemulsification; Right Eye  Surgeon: Baruch Goldmann MD Surgery Date:  04-04-22 Pre-Op Date:  03-14-22  HPI: A 37 Yr. old male patient is referred by Dr Hassell Done for cataract eval. 1. 1. The patient complains of difficulty when reading fine print, books, newspaper, instructions etc., which began 1 year ago. Both eyes are affected. Pt states he can not see small prints on TV. Symptoms occur when the patient is inside, outside and reading. The complaint is associated with glare. This is negatively affecting the patient's quality of life and the patient is unable to function adequately in life with the current level of vision. HPI was performed by Baruch Goldmann .  Medical History: Cataracts Anxiety/Depression, Hearing loss Heart Problem High Blood Pressure LDL Lung Problems Stroke  Review of Systems Respiratory COPD All recorded systems are negative except as noted above.  Social   Current every day smoker   Medication Albuterol, Atorvastatin, Clonazepam, Losartan,   Sx/Procedures Coronary Stent, Back Surgery, Broken leg sx,   Drug Allergies   NKDA  History & Physical: Heent: cataract. Right eye NECK: supple without bruits LUNGS: lungs clear to auscultation CV: regular rate and rhythm Abdomen: soft and non-tender Impression & Plan: Assessment: 1.  NUCLEAR SCLEROSIS AGE RELATED; Right Eye (H25.11) 2.  COMBINED FORMS AGE RELATED CATARACT; Left Eye (H25.812) 3.  BLEPHARITIS; Right Upper Lid, Right Lower Lid, Left Upper Lid, Left Lower Lid (H01.001, H01.002,H01.004,H01.005) 4.  DERMATOCHALASIS, no surgery; Right Upper Lid, Left Upper Lid (H02.831, H02.834) 5.  Pinguecula; Both Eyes (H11.153) 6.  VITREOUS DETACHMENT PVD; Right Eye [phone])  Plan: 1.  Cataract accounts for the patient's decreased vision. This visual impairment is not  correctable with a tolerable change in glasses or contact lenses. Cataract surgery with an implantation of a new lens should significantly improve the visual and functional status of the patient. Discussed all risks, benefits, alternatives, and potential complications. Discussed the procedures and recovery. Patient desires to have surgery. A-scan ordered and performed today for intra-ocular lens calculations. The surgery will be performed in order to improve vision for driving, reading, and for eye examinations. Recommend phacoemulsification with intra-ocular lens. Recommend Dextenza for post-operative pain and inflammation. Right Eye worse - first. Dilates poorly - shugarcaine by protocol. Malyugin Ring. Omidira.  2.  Will address after right eye.  3.  Recommend regular lid cleaning.  4.  Asymptomatic, recommend observation for now. Findings, prognosis and treatment options reviewed.  5.  Observe; Artificial tears as needed for irritation.  6.  Asymptomatic. RD precautions given. Patient to call with increase in flashing lights/floaters/dark curtain. Symptomatic.

## 2022-04-04 ENCOUNTER — Encounter (HOSPITAL_COMMUNITY): Payer: Self-pay | Admitting: Ophthalmology

## 2022-04-04 ENCOUNTER — Ambulatory Visit (HOSPITAL_COMMUNITY)
Admission: RE | Admit: 2022-04-04 | Discharge: 2022-04-04 | Disposition: A | Discharge status: Home / Self Care | Payer: Medicare HMO | Attending: Ophthalmology | Admitting: Ophthalmology

## 2022-04-04 ENCOUNTER — Ambulatory Visit: Payer: Medicare HMO | Admitting: Orthopedic Surgery

## 2022-04-04 ENCOUNTER — Encounter (HOSPITAL_COMMUNITY)
Admission: RE | Disposition: A | Discharge status: Home / Self Care | Payer: Self-pay | Source: Home / Self Care | Attending: Ophthalmology

## 2022-04-04 ENCOUNTER — Ambulatory Visit (HOSPITAL_BASED_OUTPATIENT_CLINIC_OR_DEPARTMENT_OTHER): Payer: Medicare HMO | Admitting: Anesthesiology

## 2022-04-04 ENCOUNTER — Ambulatory Visit (HOSPITAL_COMMUNITY): Payer: Medicare HMO | Admitting: Anesthesiology

## 2022-04-04 DIAGNOSIS — I252 Old myocardial infarction: Secondary | ICD-10-CM | POA: Diagnosis not present

## 2022-04-04 DIAGNOSIS — H2511 Age-related nuclear cataract, right eye: Secondary | ICD-10-CM | POA: Diagnosis not present

## 2022-04-04 DIAGNOSIS — H01004 Unspecified blepharitis left upper eyelid: Secondary | ICD-10-CM | POA: Insufficient documentation

## 2022-04-04 DIAGNOSIS — Z09 Encounter for follow-up examination after completed treatment for conditions other than malignant neoplasm: Secondary | ICD-10-CM | POA: Insufficient documentation

## 2022-04-04 DIAGNOSIS — H01001 Unspecified blepharitis right upper eyelid: Secondary | ICD-10-CM | POA: Diagnosis not present

## 2022-04-04 DIAGNOSIS — F17210 Nicotine dependence, cigarettes, uncomplicated: Secondary | ICD-10-CM

## 2022-04-04 DIAGNOSIS — H02831 Dermatochalasis of right upper eyelid: Secondary | ICD-10-CM | POA: Insufficient documentation

## 2022-04-04 DIAGNOSIS — H43811 Vitreous degeneration, right eye: Secondary | ICD-10-CM | POA: Diagnosis not present

## 2022-04-04 DIAGNOSIS — H25812 Combined forms of age-related cataract, left eye: Secondary | ICD-10-CM | POA: Insufficient documentation

## 2022-04-04 DIAGNOSIS — H01002 Unspecified blepharitis right lower eyelid: Secondary | ICD-10-CM | POA: Insufficient documentation

## 2022-04-04 DIAGNOSIS — Z8673 Personal history of transient ischemic attack (TIA), and cerebral infarction without residual deficits: Secondary | ICD-10-CM | POA: Insufficient documentation

## 2022-04-04 DIAGNOSIS — H01005 Unspecified blepharitis left lower eyelid: Secondary | ICD-10-CM | POA: Diagnosis not present

## 2022-04-04 DIAGNOSIS — I2510 Atherosclerotic heart disease of native coronary artery without angina pectoris: Secondary | ICD-10-CM | POA: Insufficient documentation

## 2022-04-04 DIAGNOSIS — Z955 Presence of coronary angioplasty implant and graft: Secondary | ICD-10-CM | POA: Diagnosis not present

## 2022-04-04 DIAGNOSIS — K219 Gastro-esophageal reflux disease without esophagitis: Secondary | ICD-10-CM | POA: Diagnosis not present

## 2022-04-04 DIAGNOSIS — H02834 Dermatochalasis of left upper eyelid: Secondary | ICD-10-CM | POA: Insufficient documentation

## 2022-04-04 DIAGNOSIS — I10 Essential (primary) hypertension: Secondary | ICD-10-CM | POA: Diagnosis not present

## 2022-04-04 HISTORY — PX: CATARACT EXTRACTION W/PHACO: SHX586

## 2022-04-04 SURGERY — PHACOEMULSIFICATION, CATARACT, WITH IOL INSERTION
Anesthesia: Monitor Anesthesia Care | Site: Eye | Laterality: Right

## 2022-04-04 MED ORDER — POVIDONE-IODINE 5 % OP SOLN
OPHTHALMIC | Status: DC | PRN
  Administered 2022-04-04: 1 via OPHTHALMIC

## 2022-04-04 MED ORDER — SODIUM HYALURONATE 10 MG/ML IO SOLUTION
PREFILLED_SYRINGE | INTRAOCULAR | Status: DC | PRN
  Administered 2022-04-04: .85 mL via INTRAOCULAR

## 2022-04-04 MED ORDER — FENTANYL CITRATE (PF) 100 MCG/2ML IJ SOLN
INTRAMUSCULAR | Status: AC
  Filled 2022-04-04: qty 2

## 2022-04-04 MED ORDER — SODIUM CHLORIDE 0.9% FLUSH
INTRAVENOUS | Status: DC | PRN
  Administered 2022-04-04: 10 mL via INTRAVENOUS

## 2022-04-04 MED ORDER — TETRACAINE HCL 0.5 % OP SOLN
1.0000 [drp] | OPHTHALMIC | Status: AC | PRN
  Administered 2022-04-04 (×3): 1 [drp] via OPHTHALMIC

## 2022-04-04 MED ORDER — TROPICAMIDE 1 % OP SOLN
1.0000 [drp] | OPHTHALMIC | Status: AC | PRN
  Administered 2022-04-04 (×3): 1 [drp] via OPHTHALMIC

## 2022-04-04 MED ORDER — NEOMYCIN-POLYMYXIN-DEXAMETH 3.5-10000-0.1 OP SUSP
OPHTHALMIC | Status: DC | PRN
  Administered 2022-04-04: 2 [drp] via OPHTHALMIC

## 2022-04-04 MED ORDER — LIDOCAINE HCL 3.5 % OP GEL
1.0000 | Freq: Once | OPHTHALMIC | Status: AC
  Administered 2022-04-04: 1 via OPHTHALMIC

## 2022-04-04 MED ORDER — LIDOCAINE HCL (PF) 1 % IJ SOLN
INTRAOCULAR | Status: DC | PRN
  Administered 2022-04-04: .45 mL via OPHTHALMIC

## 2022-04-04 MED ORDER — PHENYLEPHRINE-KETOROLAC 1-0.3 % IO SOLN
INTRAOCULAR | Status: DC | PRN
  Administered 2022-04-04: 500 mL via OPHTHALMIC

## 2022-04-04 MED ORDER — MIDAZOLAM HCL 2 MG/2ML IJ SOLN
INTRAMUSCULAR | Status: AC
  Filled 2022-04-04: qty 2

## 2022-04-04 MED ORDER — STERILE WATER FOR IRRIGATION IR SOLN
Status: DC | PRN
  Administered 2022-04-04: 25 mL

## 2022-04-04 MED ORDER — BSS IO SOLN
INTRAOCULAR | Status: DC | PRN
  Administered 2022-04-04: 15 mL via INTRAOCULAR

## 2022-04-04 MED ORDER — PHENYLEPHRINE-KETOROLAC 1-0.3 % IO SOLN
INTRAOCULAR | Status: AC
  Filled 2022-04-04: qty 4

## 2022-04-04 MED ORDER — PHENYLEPHRINE HCL 2.5 % OP SOLN
1.0000 [drp] | OPHTHALMIC | Status: AC | PRN
  Administered 2022-04-04 (×3): 1 [drp] via OPHTHALMIC

## 2022-04-04 MED ORDER — SODIUM HYALURONATE 23MG/ML IO SOSY
PREFILLED_SYRINGE | INTRAOCULAR | Status: DC | PRN
  Administered 2022-04-04: .6 mL via INTRAOCULAR

## 2022-04-04 MED ORDER — MIDAZOLAM HCL 5 MG/5ML IJ SOLN
INTRAMUSCULAR | Status: DC | PRN
  Administered 2022-04-04: .5 mg via INTRAVENOUS

## 2022-04-04 MED ORDER — FENTANYL CITRATE (PF) 100 MCG/2ML IJ SOLN
INTRAMUSCULAR | Status: DC | PRN
  Administered 2022-04-04: 25 ug via INTRAVENOUS

## 2022-04-04 SURGICAL SUPPLY — 14 items
CATARACT SUITE SIGHTPATH (MISCELLANEOUS) ×1 IMPLANT
CLOTH BEACON ORANGE TIMEOUT ST (SAFETY) ×1 IMPLANT
EYE SHIELD UNIVERSAL CLEAR (GAUZE/BANDAGES/DRESSINGS) IMPLANT
FEE CATARACT SUITE SIGHTPATH (MISCELLANEOUS) ×1 IMPLANT
GLOVE BIOGEL PI IND STRL 7.0 (GLOVE) ×2 IMPLANT
GLOVE SS BIOGEL STRL SZ 6.5 (GLOVE) IMPLANT
LENS IOL RAYNER 21.0 (Intraocular Lens) ×1 IMPLANT
LENS IOL RAYONE EMV 21.0 (Intraocular Lens) IMPLANT
NDL HYPO 18GX1.5 BLUNT FILL (NEEDLE) ×1 IMPLANT
NEEDLE HYPO 18GX1.5 BLUNT FILL (NEEDLE) ×1 IMPLANT
PAD ARMBOARD 7.5X6 YLW CONV (MISCELLANEOUS) ×1 IMPLANT
SYR TB 1ML LL NO SAFETY (SYRINGE) ×1 IMPLANT
TAPE SURG TRANSPORE 1 IN (GAUZE/BANDAGES/DRESSINGS) IMPLANT
WATER STERILE IRR 250ML POUR (IV SOLUTION) ×1 IMPLANT

## 2022-04-04 NOTE — Transfer of Care (Signed)
Immediate Anesthesia Transfer of Care Note  Patient: Alex Lara.  Procedure(s) Performed: CATARACT EXTRACTION PHACO AND INTRAOCULAR LENS PLACEMENT (IOC) (Right: Eye)  Patient Location: Short Stay  Anesthesia Type:MAC  Level of Consciousness: awake  Airway & Oxygen Therapy: Patient Spontanous Breathing  Post-op Assessment: Report given to RN  Post vital signs: Reviewed and stable  Last Vitals:  Vitals Value Taken Time  BP 153/65 04/04/22 0848  Temp 36.4 C 04/04/22 0848  Pulse 58 04/04/22 0848  Resp 18 04/04/22 0848  SpO2 100 % 04/04/22 0848    Last Pain:  Vitals:   04/04/22 0848  TempSrc: Oral  PainSc: 0-No pain      Patients Stated Pain Goal: 5 ([id number])  Complications: No notable events documented.

## 2022-04-04 NOTE — Discharge Instructions (Signed)
Please discharge patient when stable, will follow up today with Dr. Claudie Rathbone at the [facility] [place] office immediately following discharge.  Leave shield in place until visit.  All paperwork with discharge instructions will be given at the office.  [facility] Address:  [address]  [place], [place] [address]  

## 2022-04-04 NOTE — Op Note (Signed)
Date of procedure: 04/04/22  Pre-operative diagnosis: Visually significant age-related nuclear cataract, Right Eye (H25.11)  Post-operative diagnosis: Visually significant age-related nuclear cataract, Right Eye  Procedure: Removal of cataract via phacoemulsification and insertion of intra-ocular lens Rayner [id number] +21.0D into the capsular bag of the Right Eye  Attending surgeon: Gerda Diss. Reiley Bertagnolli, MD, MA  Anesthesia: MAC, Topical [place]  Complications: None  Estimated Blood Loss: <34mL (minimal)  Specimens: None  Implants: As above  Indications:  Visually significant age-related cataract, Right Eye  Procedure:  The patient was seen and identified in the pre-operative area. The operative eye was identified and dilated.  The operative eye was marked.  Topical anesthesia was administered to the operative eye.     The patient was then to the operative suite and placed in the supine position.  A timeout was performed confirming the patient, procedure to be performed, and all other relevant information.   The patient's face was prepped and draped in the usual fashion for intra-ocular surgery.  A lid speculum was placed into the operative eye and the surgical microscope moved into place and focused.  A superotemporal paracentesis was created using a 20 gauge paracentesis blade.  Shugarcaine was injected into the anterior chamber.  Viscoelastic was injected into the anterior chamber.  A temporal clear-corneal main wound incision was created using a 2.3mm microkeratome.  A continuous curvilinear capsulorrhexis was initiated using an irrigating cystitome and completed using capsulorrhexis forceps.  Hydrodissection and hydrodeliniation were performed.  Viscoelastic was injected into the anterior chamber.  A phacoemulsification handpiece and a chopper as a second instrument were used to remove the nucleus and epinucleus. The irrigation/aspiration handpiece was used to remove any remaining cortical  material.   The capsular bag was reinflated with viscoelastic, checked, and found to be intact.  The intraocular lens was inserted into the capsular bag.  The irrigation/aspiration handpiece was used to remove any remaining viscoelastic.  The clear corneal wound and paracentesis wounds were then hydrated and checked with Weck-Cels to be watertight. Maxitrol drops were instilled into the operative eye.  The lid-speculum and drape was removed, and the patient's face was cleaned with a wet and dry 4x4.  A clear shield was taped over the eye. The patient was taken to the post-operative care unit in good condition, having tolerated the procedure well.  Post-Op Instructions: The patient will follow up at [facility] for a same day post-operative evaluation and will receive all other orders and instructions.

## 2022-04-04 NOTE — Interval H&P Note (Signed)
History and Physical Interval Note:  04/04/2022 8:25 AM  Alex Lara.  has presented today for surgery, with the diagnosis of nuclear sclerosis age related cataract; right.  The various methods of treatment have been discussed with the patient and family. After consideration of risks, benefits and other options for treatment, the patient has consented to  Procedure(s) with comments: CATARACT EXTRACTION PHACO AND INTRAOCULAR LENS PLACEMENT (IOC) (Right) - CDE: as a surgical intervention.  The patient's history has been reviewed, patient examined, no change in status, stable for surgery.  I have reviewed the patient's chart and labs.  Questions were answered to the patient's satisfaction.     Baruch Goldmann

## 2022-04-04 NOTE — Anesthesia Postprocedure Evaluation (Signed)
Anesthesia Post Note  Patient: Alex Lara.  Procedure(s) Performed: CATARACT EXTRACTION PHACO AND INTRAOCULAR LENS PLACEMENT (IOC) (Right: Eye)  Patient location during evaluation: Short Stay Anesthesia Type: MAC Level of consciousness: awake and alert Pain management: pain level controlled Vital Signs Assessment: post-procedure vital signs reviewed and stable Respiratory status: spontaneous breathing Cardiovascular status: blood pressure returned to baseline and stable Postop Assessment: no apparent nausea or vomiting Anesthetic complications: no   No notable events documented.   Last Vitals:  Vitals:   04/04/22 0748 04/04/22 0848  BP: 133/74 (!) 153/65  Pulse: (!) 55 (!) 58  Resp: (P) 18 18  Temp: 36.4 C (!) 36.4 C  SpO2: 100% 100%    Last Pain:  Vitals:   04/04/22 0848  TempSrc: Oral  PainSc: 0-No pain                 Rithwik Schmieg

## 2022-04-04 NOTE — Anesthesia Preprocedure Evaluation (Signed)
Anesthesia Evaluation  Patient identified by MRN, date of birth, ID band Patient awake    Reviewed: Allergy & Precautions, NPO status , Patient's Chart, lab work & pertinent test results  Airway Mallampati: II  TM Distance: >3 FB Neck ROM: Full    Dental  (+) Edentulous Upper, Edentulous Lower   Pulmonary Current Smoker and Patient abstained from smoking.   Pulmonary exam normal breath sounds clear to auscultation       Cardiovascular hypertension, Pt. on medications + CAD, + Past MI, + Cardiac Stents, + DOE and + DVT   Rhythm:Irregular Rate:Normal  1. Left ventricular ejection fraction, by estimation, is 60 to 65%. The  left ventricle has normal function. The left ventricle has no regional  wall motion abnormalities. Left ventricular diastolic parameters were  normal.   2. Right ventricular systolic function is normal. The right ventricular  size is normal.   3. The mitral valve is degenerative. No evidence of mitral valve  regurgitation. No evidence of mitral stenosis.   4. The aortic valve is tricuspid. Aortic valve regurgitation is trivial.  Aortic valve sclerosis is present, with no evidence of aortic valve  stenosis.   5. Aortic dilatation noted. 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   6. The inferior vena cava is normal in size with greater than 50%  respiratory variability, suggesting right atrial pressure of 3 mmHg.      Neuro/Psych  PSYCHIATRIC DISORDERS Anxiety Depression     Neuromuscular disease CVA, Residual Symptoms    GI/Hepatic ,GERD  Medicated and Controlled,,  Endo/Other  negative endocrine ROS    Renal/GU Renal disease (AKI) Bladder dysfunction (prostate cancer)      Musculoskeletal  (+) Arthritis , Osteoarthritis,    Abdominal   Peds  Hematology negative hematology ROS (+)   Anesthesia Other  Findings 16-Aug-2021 18:15:19 [facility] ROUTINE RECORD Jun 12, 1940 (32 yr) Male Caucasian Test [id number] Pain Vent. rate 57 BPM PR interval  ms QRS duration 107 ms QT/QTcB 431/420 ms P-R-T axes -85 29 Atrial fibrillation Left axis deviation No significant change since last tracing Confirmed by Isla Pence [phone]) on 08/17/2021 3:42:58 PM  Reproductive/Obstetrics                             Anesthesia Physical Anesthesia Plan  ASA: 3  Anesthesia Plan: MAC   Post-op Pain Management: Minimal or no pain anticipated   Induction:   PONV Risk Score and Plan: Treatment may vary due to age or medical condition  Airway Management Planned: Nasal Cannula and Natural Airway  Additional Equipment:   Intra-op Plan:   Post-operative Plan:   Informed Consent: I have reviewed the patients History and Physical, chart, labs and discussed the procedure including the risks, benefits and alternatives for the proposed anesthesia with the patient or authorized representative who has indicated his/her understanding and acceptance.     Dental advisory given  Plan Discussed with: CRNA and Surgeon  Anesthesia Plan Comments:         Anesthesia Quick Evaluation

## 2022-04-05 NOTE — Progress Notes (Deleted)
GI Office Note    Referring Provider: Asencion Noble, MD Primary Care Physician:  Asencion Noble, MD Primary Gastroenterologist: Elon Alas. Abbey Chatters, DO   Date:  04/05/2022  ID:  Alex Lara., DOB Jun 10, 1940, MRN [id number]   Chief Complaint   No chief complaint on file.   History of Present Illness  Alex Lara. is a 82 y.o. male with a history of hemorrhoids s/p banding x3, anxiety, depression, carpal tunnel syndrome, DVT, GERD, HTN, HLD, prostate cancer, MI, Stroke, CAD s/p DES in 2010*** presenting today with complaint of dysphagia.   Colonoscopy 07/14/21: -hemorrhoids -sigmoid diverticulosis -41mm polyp in transverse colon  Last seen for hemorrhoid banding 11/04/21. Had all 3 hemorrhoid columns banded from 9/21-10/26.   Today:    Current Outpatient Medications  Medication Sig Dispense Refill   albuterol (VENTOLIN HFA) 108 (90 Base) MCG/ACT inhaler Inhale 2 puffs into the lungs every 4 (four) hours as needed for wheezing or shortness of breath.     aspirin EC 325 MG tablet Take 325 mg by mouth daily.     atorvastatin (LIPITOR) 40 MG tablet Take 1 tablet (40 mg total) by mouth daily at 6 PM. (Patient taking differently: Take 40 mg by mouth at bedtime.) 30 tablet 3   cholecalciferol (VITAMIN D3) 25 MCG (1000 UNIT) tablet Take 1,000 Units by mouth daily.     clonazePAM (KLONOPIN) 0.5 MG tablet Take 0.5 mg by mouth 3 (three) times daily as needed for anxiety.     fluticasone (FLONASE) 50 MCG/ACT nasal spray Place 1 spray into both nostrils daily as needed for allergies or rhinitis.     hydrocortisone (ANUSOL-HC) 2.5 % rectal cream Place 1 application  rectally 2 (two) times daily. 30 g 1   hydrocortisone (ANUSOL-HC) 2.5 % rectal cream Place 1 Application rectally 2 (two) times daily. 30 g 1   losartan (COZAAR) 50 MG tablet Take 50 mg by mouth daily. (Patient not taking: Reported on 11/15/2021)     melatonin 5 MG TABS Take 20 mg by mouth at bedtime.     mirabegron ER  (MYRBETRIQ) 25 MG TB24 tablet Take 1 tablet (25 mg total) by mouth daily. 28 tablet 0   sertraline (ZOLOFT) 50 MG tablet Take 50 mg by mouth daily.     traMADol (ULTRAM) 50 MG tablet Take 50 mg by mouth 2 (two) times daily.     No current facility-administered medications for this visit.    Past Medical History:  Diagnosis Date   Anxiety and depression    Arteriosclerotic cardiovascular disease (ASCVD)    06/2008: DES to the RCA and LAD   Blood clotting disorder (HCC)    Carpal tunnel syndrome    Chronic anticoagulation    Deep vein thrombosis (HCC)    Degenerative joint disease    Gastroesophageal reflux disease    Hyperlipidemia    Hypertension    Myocardial infarction ([place])    1970   Prostate cancer ([place])    Stroke ([place])    Tobacco abuse     Past Surgical History:  Procedure Laterality Date   [place]  2004   stents   COLONOSCOPY  ?  Date   COLONOSCOPY WITH PROPOFOL N/A 07/14/2021   Procedure: COLONOSCOPY WITH PROPOFOL;  Surgeon: Eloise Harman, DO;  [facility];  Service: Endoscopy;  Laterality: N/A;  8:15am, asa 3   IM NAILING HUMERUS Left    INGUINAL HERNIA REPAIR  ORIF-unknown fracture     POLYPECTOMY  07/14/2021   Procedure: POLYPECTOMY;  Surgeon: Eloise Harman, DO;  [facility];  Service: Endoscopy;;   PROSTATE BIOPSY     ROTATOR CUFF REPAIR      Family History  Problem Relation Age of Onset   Coronary artery disease Father    Coronary artery disease Mother        died post-CABG   Heart disease Mother    Breast cancer Maternal Aunt    Lung cancer Cousin    Melanoma Daughter    Prostate cancer Neg Hx     Allergies as of 04/06/2022 - Review Complete 04/04/2022  Allergen Reaction Noted   Codeine Nausea And Vomiting and Other (See Comments) 11/05/2013    Social History   Socioeconomic History   Marital status: Married    Spouse name: Not on file   Number of children: 3   Years  of education: 12   Highest education level: Not on file  Occupational History    Comment: retired  Tobacco Use   Smoking status: Every Day    Packs/day: 1.50    Years: 55.00    Additional pack years: 0.00    Total pack years: 82.50    Types: Cigarettes   Smokeless tobacco: Never  Vaping Use   Vaping Use: Never used  Substance and Sexual Activity   Alcohol use: Yes    Alcohol/week: 0.0 standard drinks of alcohol    Comment: Patient states he is cutting down. 2 drinks today. none yesterday. History of ETOH abuse   Drug use: No   Sexual activity: Not Currently  Other Topics Concern   Not on file  Social History Narrative   Not on file   Social Determinants of Health   Financial Resource Strain: Not on file  Food Insecurity: Not on file  Transportation Needs: Not on file  Physical Activity: Not on file  Stress: Not on file  Social Connections: Not on file     Review of Systems   Gen: Denies fever, chills, anorexia. Denies fatigue, weakness, weight loss.  CV: Denies chest pain, palpitations, syncope, peripheral edema, and claudication. Resp: Denies dyspnea at rest, cough, wheezing, coughing up blood, and pleurisy. GI: See HPI Derm: Denies rash, itching, dry skin Psych: Denies depression, anxiety, memory loss, confusion. No homicidal or suicidal ideation.  Heme: Denies bruising, bleeding, and enlarged lymph nodes.   Physical Exam   There were no vitals taken for this visit.  General:   Alert and oriented. No distress noted. Pleasant and cooperative.  Head:  Normocephalic and atraumatic. Eyes:  Conjuctiva clear without scleral icterus. Mouth:  Oral mucosa pink and moist. Good dentition. No lesions. Lungs:  Clear to auscultation bilaterally. No wheezes, rales, or rhonchi. No distress.  Heart:  S1, S2 present without murmurs appreciated.  Abdomen:  +BS, soft, non-tender and non-distended. No rebound or guarding. No HSM or masses noted. Rectal: *** Msk:  Symmetrical  without gross deformities. Normal posture. Extremities:  Without edema. Neurologic:  Alert and  oriented x4 Psych:  Alert and cooperative. Normal mood and affect.   Assessment  Alex Lara. is a 82 y.o. male with a history of anxiety, depression, carpal tunnel syndrome, DVT, GERD, HTN, HLD, prostate cancer, MI, Stroke, CAD s/p DES in 2010*** presenting today with complaint of dysphagia.   Dysphagia:   PLAN   ***     Venetia Night, MSN, FNP-BC, AGACNP-BC [facility]

## 2022-04-06 ENCOUNTER — Encounter: Payer: Medicare HMO | Admitting: Gastroenterology

## 2022-04-13 ENCOUNTER — Encounter (HOSPITAL_COMMUNITY): Payer: Self-pay | Admitting: Ophthalmology

## 2022-04-20 ENCOUNTER — Ambulatory Visit (INDEPENDENT_AMBULATORY_CARE_PROVIDER_SITE_OTHER): Payer: Medicare HMO | Admitting: Internal Medicine

## 2022-04-20 ENCOUNTER — Encounter: Payer: Self-pay | Admitting: Internal Medicine

## 2022-04-20 VITALS — BP 137/75 | HR 62 | Temp 97.8°F | Ht 72.0 in | Wt 184.0 lb

## 2022-04-20 DIAGNOSIS — R1312 Dysphagia, oropharyngeal phase: Secondary | ICD-10-CM | POA: Diagnosis not present

## 2022-04-20 DIAGNOSIS — K5904 Chronic idiopathic constipation: Secondary | ICD-10-CM | POA: Diagnosis not present

## 2022-04-20 MED ORDER — PANTOPRAZOLE SODIUM 40 MG PO TBEC: 40.0000 mg | DELAYED_RELEASE_TABLET | Freq: Every day | ORAL | 11 refills | Status: DC

## 2022-04-20 NOTE — Progress Notes (Signed)
Primary Care Physician:  Carylon PerchesFagan, Roy, MD Primary Gastroenterologist:  Dr. Marletta Lorarver  Chief Complaint  Patient presents with   Dysphagia    Patient arrives with wife Alex Lara. Referred for Having trouble with pills and food getting stuck.     HPI:   Alex Gearorman H Potvin Jr. is a 82 y.o. male who presents to the clinic today for follow-up visit.    Underwent colonoscopy 07/14/2021 for rectal pain/discomfort, found to have nonbleeding internal hemorrhoids, sigmoid diverticulosis, 1 tubular adenoma in the transverse colon.  Today notes 2 weeks of oral pharyngeal dysphagia.  Primarily with pills getting stuck in the back of his throat.  Occasional issues with food.  No epigastric or chest pain.  Denies any acid reflux or heartburn.  No melena hematochezia.  Symptoms mild to moderate, constant.  No previous upper endoscopy.  Does have issues with chronic constipation.  Previously on MiraLAX, did not help, now taking over-the-counter stool softener as well as fiber and symptoms relatively well-controlled.  Past Medical History:  Diagnosis Date   Anxiety and depression    Arteriosclerotic cardiovascular disease (ASCVD)    06/2008: DES to the RCA and LAD   Blood clotting disorder    Carpal tunnel syndrome    Chronic anticoagulation    Deep vein thrombosis    Degenerative joint disease    Gastroesophageal reflux disease    Hyperlipidemia    Hypertension    Myocardial infarction    1970   Prostate cancer    Stroke    Tobacco abuse     Past Surgical History:  Procedure Laterality Date   BACK SURGERY  1988   CARDIAC CATHETERIZATION  2004   stents   CATARACT EXTRACTION W/PHACO Right 04/04/2022   Procedure: CATARACT EXTRACTION PHACO AND INTRAOCULAR LENS PLACEMENT (IOC);  Surgeon: Fabio PierceWrzosek, James, MD;  Location: [facility];  Service: Ophthalmology;  Laterality: Right;  CDE: 10.63   COLONOSCOPY  ?  Date   COLONOSCOPY WITH PROPOFOL N/A 07/14/2021   Procedure: COLONOSCOPY WITH PROPOFOL;  Surgeon: Lanelle Balarver,  Emmaleigh Longo K, DO;  [facility];  Service: Endoscopy;  Laterality: N/A;  8:15am, asa 3   IM NAILING HUMERUS Left    INGUINAL HERNIA REPAIR     ORIF-unknown fracture     POLYPECTOMY  07/14/2021   Procedure: POLYPECTOMY;  Surgeon: Lanelle Balarver, Shakeitha Umbaugh K, DO;  [facility];  Service: Endoscopy;;   PROSTATE BIOPSY     ROTATOR CUFF REPAIR      Current Outpatient Medications  Medication Sig Dispense Refill   albuterol (VENTOLIN HFA) 108 (90 Base) MCG/ACT inhaler Inhale 2 puffs into the lungs every 4 (four) hours as needed for wheezing or shortness of breath.     aspirin EC 325 MG tablet Take 325 mg by mouth daily.     atorvastatin (LIPITOR) 40 MG tablet Take 1 tablet (40 mg total) by mouth daily at 6 PM. (Patient taking differently: Take 40 mg by mouth at bedtime.) 30 tablet 3   cholecalciferol (VITAMIN D3) 25 MCG (1000 UNIT) tablet Take 1,000 Units by mouth daily.     clonazePAM (KLONOPIN) 0.5 MG tablet Take 0.5 mg by mouth 3 (three) times daily as needed for anxiety.     melatonin 5 MG TABS Take 20 mg by mouth at bedtime.     mirabegron ER (MYRBETRIQ) 25 MG TB24 tablet Take 1 tablet (25 mg total) by mouth daily. 28 tablet 0   SERTRALINE HCL PO Take 100 mg by mouth daily.  No current facility-administered medications for this visit.    Allergies as of 04/20/2022 - Review Complete 04/20/2022  Allergen Reaction Noted   Codeine Nausea And Vomiting and Other (See Comments) 11/05/2013    Family History  Problem Relation Age of Onset   Coronary artery disease Father    Coronary artery disease Mother        died post-CABG   Heart disease Mother    Breast cancer Maternal Aunt    Lung cancer Cousin    Melanoma Daughter    Prostate cancer Neg Hx     Social History   Socioeconomic History   Marital status: Married    Spouse name: Not on file   Number of children: 3   Years of education: 12   Highest education level: Not on file  Occupational History    Comment:  retired  Tobacco Use   Smoking status: Every Day    Packs/day: 1.50    Years: 55.00    Additional pack years: 0.00    Total pack years: 82.50    Types: Cigarettes    Passive exposure: Current   Smokeless tobacco: Never  Vaping Use   Vaping Use: Never used  Substance and Sexual Activity   Alcohol use: Not Currently   Drug use: No   Sexual activity: Not Currently  Other Topics Concern   Not on file  Social History Narrative   Not on file   Social Determinants of Health   Financial Resource Strain: Not on file  Food Insecurity: Not on file  Transportation Needs: Not on file  Physical Activity: Not on file  Stress: Not on file  Social Connections: Not on file  Intimate Partner Violence: Not on file    Subjective: Review of Systems  Constitutional:  Negative for chills and fever.  HENT:  Negative for congestion and hearing loss.   Eyes:  Negative for blurred vision and double vision.  Respiratory:  Negative for cough and shortness of breath.   Cardiovascular:  Negative for chest pain and palpitations.  Gastrointestinal:  Positive for abdominal pain and constipation. Negative for diarrhea, heartburn, melena and vomiting.       Rectal pain  Genitourinary:  Negative for dysuria and urgency.  Musculoskeletal:  Negative for joint pain and myalgias.  Skin:  Negative for itching and rash.  Neurological:  Negative for dizziness and headaches.  Psychiatric/Behavioral:  Negative for depression. The patient is not nervous/anxious.        Objective: BP 137/75 (BP Location: Left Arm, Patient Position: Sitting, Cuff Size: Normal)   Pulse 62   Temp 97.8 F (36.6 C) (Oral)   Ht 6' (1.829 m)   Wt 184 lb (83.5 kg)   BMI 24.95 kg/m  Physical Exam Constitutional:      Appearance: Normal appearance.  HENT:     Head: Normocephalic and atraumatic.  Eyes:     Extraocular Movements: Extraocular movements intact.     Conjunctiva/sclera: Conjunctivae normal.  Cardiovascular:      Rate and Rhythm: Normal rate and regular rhythm.  Pulmonary:     Effort: Pulmonary effort is normal.     Breath sounds: Normal breath sounds.  Abdominal:     General: Bowel sounds are normal.     Palpations: Abdomen is soft.  Musculoskeletal:        General: Normal range of motion.     Cervical back: Normal range of motion and neck supple.  Skin:    General: Skin is warm.  Neurological:     General: No focal deficit present.     Mental Status: He is alert and oriented to person, place, and time.  Psychiatric:        Mood and Affect: Mood normal.        Behavior: Behavior normal.      Assessment: *Oropharyngeal dysphagia  *Constipation  Plan: Will order modified barium swallow study with speech therapy present.  Appreciate their help with this patient.  May benefit from EGD with dilation though will await MBSS first.  Start on pantoprazole daily.  Continue stool softener for chronic constipation.  Continue fiber therapy.  Follow-up in 3 to 4 months.   04/20/2022 2:44 PM   Disclaimer: This note was dictated with voice recognition software. Similar sounding words can inadvertently be transcribed and may not be corrected upon review.

## 2022-04-20 NOTE — Patient Instructions (Signed)
We will schedule you for a swallowing study to further evaluate your difficulty swallowing.  I am going to start you on a new medication called pantoprazole 40 mg daily to help with any inflammation/acid reflux.  Continue on stool softener for your chronic constipation.  Follow-up in 3 to 4 months.  It was very nice seeing both you today.  Dr. Marletta Lor

## 2022-04-25 ENCOUNTER — Ambulatory Visit (INDEPENDENT_AMBULATORY_CARE_PROVIDER_SITE_OTHER): Payer: Medicare HMO | Admitting: Orthopedic Surgery

## 2022-04-25 DIAGNOSIS — M25512 Pain in left shoulder: Secondary | ICD-10-CM | POA: Diagnosis not present

## 2022-04-25 DIAGNOSIS — M25511 Pain in right shoulder: Secondary | ICD-10-CM

## 2022-04-25 DIAGNOSIS — G8929 Other chronic pain: Secondary | ICD-10-CM

## 2022-04-25 MED ORDER — METHYLPREDNISOLONE ACETATE 40 MG/ML IJ SUSP
40.0000 mg | Freq: Once | INTRAMUSCULAR | Status: AC
  Administered 2022-04-25: 40 mg via INTRA_ARTICULAR

## 2022-04-25 NOTE — Progress Notes (Signed)
Chief Complaint  Patient presents with   Injections    BOTH shoulders     Procedure injection right shoulder subacromial joint and left shoulder subacromial joint   procedure note the subacromial injection shoulder RIGHT  Verbal consent was obtained to inject the  RIGHT   Shoulder  Timeout was completed to confirm the injection site is a subacromial space of the  RIGHT  shoulder   Medication used Depo-Medrol 40 mg and lidocaine 1% 3 cc  Anesthesia was provided by ethyl chloride  The injection was performed in the RIGHT  posterior subacromial space. After pinning the skin with alcohol and anesthetized the skin with ethyl chloride the subacromial space was injected using a 20-gauge needle. There were no complications  Sterile dressing was applied.    Procedure note the subacromial injection shoulder left   Verbal consent was obtained to inject the  Left   Shoulder  Timeout was completed to confirm the injection site is a subacromial space of the  left  shoulder  Medication used Depo-Medrol 40 mg and lidocaine 1% 3 cc  Anesthesia was provided by ethyl chloride  The injection was performed in the left  posterior subacromial space. After pinning the skin with alcohol and anesthetized the skin with ethyl chloride the subacromial space was injected using a 20-gauge needle. There were no complications  Sterile dressing was applied.

## 2022-04-25 NOTE — Patient Instructions (Signed)

## 2022-04-27 ENCOUNTER — Other Ambulatory Visit: Payer: Self-pay | Admitting: *Deleted

## 2022-04-27 DIAGNOSIS — R1312 Dysphagia, oropharyngeal phase: Secondary | ICD-10-CM

## 2022-04-27 NOTE — Progress Notes (Signed)
error 

## 2022-04-28 ENCOUNTER — Encounter (HOSPITAL_COMMUNITY): Admission: RE | Admit: 2022-04-28 | Payer: Medicare HMO | Source: Ambulatory Visit

## 2022-04-28 ENCOUNTER — Other Ambulatory Visit (HOSPITAL_COMMUNITY): Payer: Self-pay | Admitting: Occupational Therapy

## 2022-04-28 DIAGNOSIS — R1312 Dysphagia, oropharyngeal phase: Secondary | ICD-10-CM

## 2022-04-28 DIAGNOSIS — K219 Gastro-esophageal reflux disease without esophagitis: Secondary | ICD-10-CM

## 2022-05-02 ENCOUNTER — Ambulatory Visit: Admit: 2022-05-02 | Payer: Medicare HMO | Admitting: Ophthalmology

## 2022-05-02 SURGERY — PHACOEMULSIFICATION, CATARACT, WITH IOL INSERTION
Anesthesia: Monitor Anesthesia Care | Laterality: Left

## 2022-05-26 ENCOUNTER — Ambulatory Visit (INDEPENDENT_AMBULATORY_CARE_PROVIDER_SITE_OTHER): Payer: Medicare HMO | Admitting: Gastroenterology

## 2022-05-26 ENCOUNTER — Encounter: Payer: Self-pay | Admitting: Gastroenterology

## 2022-05-26 VITALS — BP 137/81 | HR 64 | Temp 97.5°F | Ht 72.0 in | Wt 184.9 lb

## 2022-05-26 DIAGNOSIS — K642 Third degree hemorrhoids: Secondary | ICD-10-CM | POA: Diagnosis not present

## 2022-05-26 MED ORDER — HYDROCORTISONE (PERIANAL) 2.5 % EX CREA: 1.0000 | TOPICAL_CREAM | Freq: Two times a day (BID) | CUTANEOUS | 1 refills | Status: DC

## 2022-05-26 NOTE — Progress Notes (Signed)
Gastroenterology Office Note     Primary Care Physician:  Carylon Perches, MD  Primary Gastroenterologist:   Chief Complaint   Chief Complaint  Patient presents with   Hemorrhoids    Patient here today for a follow up. Patient has a history of hemorrhoids. He says he has some rectal area discomfort. He says he feels the need to defecate all the time.     History of Present Illness   Lotus Mcclenaghan. is an 82 y.o. male presenting today due to concern for hemorrhoids. He was seen in April 2024 for odynophagia by Dr. Marletta Lor. Upcoming Speech eval.   Visit today just for hemorrhoids.   All 3 columns banded last year. Fiber daily. Never drinks water. Laxative prn. Stool softener bid. Feels like something is in his rectum. States something burst today and had some rectal bleeding.   Last colonoscopy July 2023: non-bleeding internal hemorrhoids, sigmoid diverticulosis, one 5 mm polyp s/p removal. Sessile serrated adenoma. 5 year surveillance    Past Medical History:  Diagnosis Date   Anxiety and depression    Arteriosclerotic cardiovascular disease (ASCVD)    06/2008: DES to the RCA and LAD   Blood clotting disorder (HCC)    Carpal tunnel syndrome    Chronic anticoagulation    Deep vein thrombosis (HCC)    Degenerative joint disease    Gastroesophageal reflux disease    Hyperlipidemia    Hypertension    Myocardial infarction (HCC)    1970   Prostate cancer (HCC)    Stroke (HCC)    Tobacco abuse     Past Surgical History:  Procedure Laterality Date   BACK SURGERY  1988   CARDIAC CATHETERIZATION  2004   stents   CATARACT EXTRACTION W/PHACO Right 04/04/2022   Procedure: CATARACT EXTRACTION PHACO AND INTRAOCULAR LENS PLACEMENT (IOC);  Surgeon: Fabio Pierce, MD;  Location: [facility];  Service: Ophthalmology;  Laterality: Right;  CDE: 10.63   COLONOSCOPY  ?  Date   COLONOSCOPY WITH PROPOFOL N/A 07/14/2021   Procedure: COLONOSCOPY WITH PROPOFOL;  Surgeon: Lanelle Bal,  DO;  Location: [facility];  Service: Endoscopy;  Laterality: N/A;  8:15am, asa 3   IM NAILING HUMERUS Left    INGUINAL HERNIA REPAIR     ORIF-unknown fracture     POLYPECTOMY  07/14/2021   Procedure: POLYPECTOMY;  Surgeon: Lanelle Bal, DO;  Location: [facility];  Service: Endoscopy;;   PROSTATE BIOPSY     ROTATOR CUFF REPAIR      Current Outpatient Medications  Medication Sig Dispense Refill   albuterol (VENTOLIN HFA) 108 (90 Base) MCG/ACT inhaler Inhale 2 puffs into the lungs every 4 (four) hours as needed for wheezing or shortness of breath.     aspirin EC 325 MG tablet Take 325 mg by mouth daily.     atorvastatin (LIPITOR) 40 MG tablet Take 1 tablet (40 mg total) by mouth daily at 6 PM. (Patient taking differently: Take 40 mg by mouth at bedtime.) 30 tablet 3   cholecalciferol (VITAMIN D3) 25 MCG (1000 UNIT) tablet Take 1,000 Units by mouth daily.     clonazePAM (KLONOPIN) 0.5 MG tablet Take 0.5 mg by mouth 3 (three) times daily as needed for anxiety.     losartan (COZAAR) 50 MG tablet Take 50 mg by mouth daily.     melatonin 5 MG TABS Take 20 mg by mouth at bedtime.     pantoprazole (PROTONIX) 40 MG tablet Take 1  tablet (40 mg total) by mouth daily. 30 tablet 11   SERTRALINE HCL PO Take 100 mg by mouth daily.     No current facility-administered medications for this visit.    Allergies as of 05/26/2022 - Review Complete 05/26/2022  Allergen Reaction Noted   Codeine Nausea And Vomiting and Other (See Comments) 11/05/2013    Family History  Problem Relation Age of Onset   Coronary artery disease Father    Coronary artery disease Mother        died post-CABG   Heart disease Mother    Breast cancer Maternal Aunt    Lung cancer Cousin    Melanoma Daughter    Prostate cancer Neg Hx     Social History   Socioeconomic History   Marital status: Married    Spouse name: Not on file   Number of children: 3   Years of education: 12   Highest education level: Not on  file  Occupational History    Comment: retired  Tobacco Use   Smoking status: Every Day    Packs/day: 1.50    Years: 55.00    Additional pack years: 0.00    Total pack years: 82.50    Types: Cigarettes    Passive exposure: Current   Smokeless tobacco: Never  Vaping Use   Vaping Use: Never used  Substance and Sexual Activity   Alcohol use: Not Currently   Drug use: No   Sexual activity: Not Currently  Other Topics Concern   Not on file  Social History Narrative   Not on file   Social Determinants of Health   Financial Resource Strain: Not on file  Food Insecurity: Not on file  Transportation Needs: Not on file  Physical Activity: Not on file  Stress: Not on file  Social Connections: Not on file  Intimate Partner Violence: Not on file     Review of Systems   Gen: Denies any fever, chills, fatigue, weight loss, lack of appetite.  CV: Denies chest pain, heart palpitations, peripheral edema, syncope.  Resp: Denies shortness of breath at rest or with exertion. Denies wheezing or cough.  GI: Denies dysphagia or odynophagia. Denies jaundice, hematemesis, fecal incontinence. GU : Denies urinary burning, urinary frequency, urinary hesitancy MS: Denies joint pain, muscle weakness, cramps, or limitation of movement.  Derm: Denies rash, itching, dry skin Psych: Denies depression, anxiety, memory loss, and confusion Heme: Denies bruising, bleeding, and enlarged lymph nodes.   Physical Exam   BP 137/81 (BP Location: Left Arm, Patient Position: Sitting, Cuff Size: Normal)   Pulse 64   Temp (!) 97.5 F (36.4 C) (Temporal)   Ht 6' (1.829 m)   Wt 184 lb 14.4 oz (83.9 kg)   BMI 25.08 kg/m  General:   Alert and oriented. Pleasant and cooperative. Well-nourished and well-developed.  Head:  Normocephalic and atraumatic. Eyes:  Without icterus Rectal:  anterior prolapsing thrombosed grade 3 hemorrhoid and small external hemorrhoid. Difficult to do DRE due to discomfort.  Msk:   Symmetrical without gross deformities. Normal posture. Extremities:  Without edema. Neurologic:  Alert and  oriented x4;  grossly normal neurologically. Skin:  Intact without significant lesions or rashes. Psych:  Alert and cooperative. Normal mood and affect.   Assessment   Kadarian Ariail. is an 82 y.o. male presenting today due to hemorrhoid flare.  Previously banded all 3 columns last year, now with recurrent Grade 3 hemorrhoid. Appropriate for supportive measures at this time. Despite recommendations to increase  water intake, he continues to only drink pepsi. I did discuss with him again that in order for the supplemental fiber to be effective, that he needs to increase water intake. Continue stool softeners and Miralax prn.  I suspect he could benefit from banding again once acute issue calms.      PLAN    Anusol BID Increase water intake Avoid straining, limit toilet time to 2- Follow-up 6 weeks for likely banding   Gelene Mink, PhD, [facility] Gastroenterology

## 2022-05-26 NOTE — Patient Instructions (Signed)
I have sent in hydrocortisone cream to apply twice a day to your rectum.   Avoid straining. Limit sitting on the toilet to 2-3 minutes.  It's very important that you drink at least 8 glasses of water a day. If you don't drink water with your fiber, it's going to just make things worse.  I will see you in 6 weeks! We may need to do another banding then.  I enjoyed seeing you again today! I value our relationship and want to provide genuine, compassionate, and quality care. You may receive a survey regarding your visit with me, and I welcome your feedback! Thanks so much for taking the time to complete this. I look forward to seeing you again.      Gelene Mink, PhD, ANP-BC [facility] Gastroenterology

## 2022-05-30 ENCOUNTER — Other Ambulatory Visit (HOSPITAL_COMMUNITY): Payer: Medicare HMO

## 2022-05-30 ENCOUNTER — Ambulatory Visit (HOSPITAL_COMMUNITY): Payer: Medicare HMO | Attending: Speech Pathology | Admitting: Speech Pathology

## 2022-06-22 ENCOUNTER — Other Ambulatory Visit: Payer: Self-pay | Admitting: Optometry

## 2022-06-22 DIAGNOSIS — G453 Amaurosis fugax: Secondary | ICD-10-CM | POA: Diagnosis not present

## 2022-06-22 NOTE — Progress Notes (Unsigned)
Ordering CTA head and neck for transient monocular vision loss.

## 2022-06-27 ENCOUNTER — Telehealth: Payer: Self-pay

## 2022-06-27 ENCOUNTER — Encounter: Payer: Medicare HMO | Admitting: Orthopedic Surgery

## 2022-06-27 NOTE — Telephone Encounter (Signed)
Returned the pt's call regarding rectal burning. No ans/vm .

## 2022-07-04 MED ORDER — HYDROCORTISONE (PERIANAL) 2.5 % EX CREA: 1.0000 | TOPICAL_CREAM | Freq: Two times a day (BID) | CUTANEOUS | 1 refills | Status: DC

## 2022-07-04 NOTE — Telephone Encounter (Signed)
Pt's wife returned call pt is needing another Rx for anal cream sent in for anal burning. Last ov was 5/16. Please advise

## 2022-07-04 NOTE — Telephone Encounter (Signed)
Noted and pt advised.

## 2022-07-04 NOTE — Telephone Encounter (Signed)
Prescription sent

## 2022-07-04 NOTE — Addendum Note (Signed)
Addended by: Gelene Mink on: 07/04/2022 01:45 PM   Modules accepted: Orders

## 2022-07-07 ENCOUNTER — Encounter: Payer: Self-pay | Admitting: Orthopedic Surgery

## 2022-07-07 ENCOUNTER — Ambulatory Visit: Payer: Medicare HMO | Admitting: Orthopedic Surgery

## 2022-07-07 ENCOUNTER — Other Ambulatory Visit (INDEPENDENT_AMBULATORY_CARE_PROVIDER_SITE_OTHER): Payer: Medicare HMO

## 2022-07-07 VITALS — BP 152/81 | HR 71 | Ht 72.0 in | Wt 184.0 lb

## 2022-07-07 DIAGNOSIS — S72302S Unspecified fracture of shaft of left femur, sequela: Secondary | ICD-10-CM | POA: Diagnosis not present

## 2022-07-07 DIAGNOSIS — M48061 Spinal stenosis, lumbar region without neurogenic claudication: Secondary | ICD-10-CM

## 2022-07-07 DIAGNOSIS — M25552 Pain in left hip: Secondary | ICD-10-CM

## 2022-07-07 MED ORDER — GABAPENTIN 100 MG PO CAPS
100.0000 mg | ORAL_CAPSULE | Freq: Three times a day (TID) | ORAL | 2 refills | Status: DC | PRN
Start: 2022-07-07 — End: 2022-12-07

## 2022-07-07 NOTE — Progress Notes (Signed)
Chief Complaint  Patient presents with   Leg Pain    States entire left leg hurts denies back pain but states has history of rod in that leg that is old and has never bothered him before     Alex Lara, Montez Hageman. is 82 years old has had femoral nailing in the past comes in with left leg pain denies back pain but says his entire leg hurts at times today he is hurting from his left hip down to his knee all  Reports heaviness of his legs  Straight leg raises are negative hip range of motion he is tender in his back and left buttock  No weakness on manual muscle testing.  Reflexes plus  Imaging will be obtained of his left femur as we have to images of his back which show severe degenerative disc disease which most likely is causing spinal stenosis  Trochanteric nail full-length no complications noted on the x-ray  Diagnosis  Encounter Diagnoses  Name Primary?   Pain in left hip    Degenerative lumbar spinal stenosis Yes   Closed fracture of shaft of left femur, unspecified fracture morphology, sequela     Recommend physical therapy Continue tramadol Gabapentin has been helpful spinal stenosis  Return as needed  Meds ordered this encounter  Medications   gabapentin (NEURONTIN) 100 MG capsule    Sig: Take 1 capsule (100 mg total) by mouth 3 (three) times daily as needed.    Dispense:  90 capsule    Refill:  2

## 2022-07-07 NOTE — Addendum Note (Signed)
Addended byCaffie Damme on: 07/07/2022 02:19 PM   Modules accepted: Orders

## 2022-07-07 NOTE — Patient Instructions (Signed)
Physical therapy has been ordered for you at [facility]. They should call you to schedule, [phone] is the phone number to call, if you want to call to schedule.   

## 2022-07-08 ENCOUNTER — Ambulatory Visit (INDEPENDENT_AMBULATORY_CARE_PROVIDER_SITE_OTHER): Payer: Medicare HMO | Admitting: Orthopedic Surgery

## 2022-07-08 ENCOUNTER — Encounter: Payer: Self-pay | Admitting: Orthopedic Surgery

## 2022-07-08 VITALS — BP 152/81 | Ht 72.0 in | Wt 184.0 lb

## 2022-07-08 DIAGNOSIS — M25512 Pain in left shoulder: Secondary | ICD-10-CM

## 2022-07-08 DIAGNOSIS — G8929 Other chronic pain: Secondary | ICD-10-CM | POA: Diagnosis not present

## 2022-07-08 DIAGNOSIS — M25511 Pain in right shoulder: Secondary | ICD-10-CM

## 2022-07-08 MED ORDER — METHYLPREDNISOLONE ACETATE 40 MG/ML IJ SUSP
40.0000 mg | Freq: Once | INTRAMUSCULAR | Status: AC
  Administered 2022-07-08: 40 mg via INTRA_ARTICULAR

## 2022-07-08 NOTE — Patient Instructions (Signed)

## 2022-07-08 NOTE — Progress Notes (Signed)
Chief Complaint  Patient presents with   Injections    Bilateral shoulders     Encounter Diagnosis  Name Primary?   Chronic pain of both shoulders Yes    Alex Lara comes in for bilateral shoulder injections requested with good results from prior injections  Procedure injection right shoulder subacromial joint and left shoulder subacromial joint   procedure note the subacromial injection shoulder RIGHT  Verbal consent was obtained to inject the  RIGHT   Shoulder  Timeout was completed to confirm the injection site is a subacromial space of the  RIGHT  shoulder   Medication used Depo-Medrol 40 mg and lidocaine 1% 3 cc  Anesthesia was provided by ethyl chloride  The injection was performed in the RIGHT  posterior subacromial space. After pinning the skin with alcohol and anesthetized the skin with ethyl chloride the subacromial space was injected using a 20-gauge needle. There were no complications  Sterile dressing was applied.    Procedure note the subacromial injection shoulder left   Verbal consent was obtained to inject the  Left   Shoulder  Timeout was completed to confirm the injection site is a subacromial space of the  left  shoulder  Medication used Depo-Medrol 40 mg and lidocaine 1% 3 cc  Anesthesia was provided by ethyl chloride  The injection was performed in the left  posterior subacromial space. After pinning the skin with alcohol and anesthetized the skin with ethyl chloride the subacromial space was injected using a 20-gauge needle. There were no complications  Sterile dressing was applied.

## 2022-07-13 DIAGNOSIS — F419 Anxiety disorder, unspecified: Secondary | ICD-10-CM | POA: Diagnosis not present

## 2022-07-13 DIAGNOSIS — J449 Chronic obstructive pulmonary disease, unspecified: Secondary | ICD-10-CM | POA: Diagnosis not present

## 2022-07-13 DIAGNOSIS — Z79899 Other long term (current) drug therapy: Secondary | ICD-10-CM | POA: Diagnosis not present

## 2022-08-03 ENCOUNTER — Ambulatory Visit (HOSPITAL_COMMUNITY): Payer: Medicare HMO | Admitting: Physical Therapy

## 2022-08-12 DIAGNOSIS — Z823 Family history of stroke: Secondary | ICD-10-CM | POA: Diagnosis not present

## 2022-08-12 DIAGNOSIS — M1990 Unspecified osteoarthritis, unspecified site: Secondary | ICD-10-CM | POA: Diagnosis not present

## 2022-08-15 DIAGNOSIS — H26491 Other secondary cataract, right eye: Secondary | ICD-10-CM | POA: Diagnosis not present

## 2022-08-31 ENCOUNTER — Ambulatory Visit: Payer: Medicare HMO | Admitting: Internal Medicine

## 2022-09-15 ENCOUNTER — Other Ambulatory Visit (HOSPITAL_COMMUNITY): Payer: Self-pay | Admitting: Internal Medicine

## 2022-09-15 ENCOUNTER — Ambulatory Visit (HOSPITAL_COMMUNITY): Payer: Medicare HMO | Attending: Speech Pathology | Admitting: Physical Therapy

## 2022-09-15 ENCOUNTER — Ambulatory Visit (HOSPITAL_COMMUNITY)
Admission: RE | Admit: 2022-09-15 | Discharge: 2022-09-15 | Disposition: A | Discharge status: Home / Self Care | Payer: Medicare HMO | Source: Ambulatory Visit | Attending: Internal Medicine | Admitting: Internal Medicine

## 2022-09-15 DIAGNOSIS — R52 Pain, unspecified: Secondary | ICD-10-CM | POA: Insufficient documentation

## 2022-09-15 DIAGNOSIS — M48061 Spinal stenosis, lumbar region without neurogenic claudication: Secondary | ICD-10-CM | POA: Diagnosis not present

## 2022-09-15 DIAGNOSIS — M6281 Muscle weakness (generalized): Secondary | ICD-10-CM | POA: Diagnosis not present

## 2022-09-15 DIAGNOSIS — R2681 Unsteadiness on feet: Secondary | ICD-10-CM | POA: Insufficient documentation

## 2022-09-15 DIAGNOSIS — R2689 Other abnormalities of gait and mobility: Secondary | ICD-10-CM | POA: Diagnosis not present

## 2022-09-15 DIAGNOSIS — R0781 Pleurodynia: Secondary | ICD-10-CM | POA: Diagnosis not present

## 2022-09-15 NOTE — Therapy (Signed)
OUTPATIENT PHYSICAL THERAPY THORACOLUMBAR EVALUATION   Patient Name: Alex Lara. MRN: [id number] DOB:1940-11-28, 82 y.o., male Today's Date: 09/15/2022  END OF SESSION:  PT End of Session - 09/15/22 1509     Visit Number 1    Number of Visits 16    Date for PT Re-Evaluation 11/10/22    Authorization Type [employer]    PT Start Time 1435    PT Stop Time 1515    PT Time Calculation (min) 40 min    Activity Tolerance Patient tolerated treatment well    Behavior During Therapy WFL for tasks assessed/performed             Past Medical History:  Diagnosis Date   Anxiety and depression    Arteriosclerotic cardiovascular disease (ASCVD)    06/2008: DES to the RCA and LAD   Blood clotting disorder (HCC)    Carpal tunnel syndrome    Chronic anticoagulation    Deep vein thrombosis (HCC)    Degenerative joint disease    Gastroesophageal reflux disease    Hyperlipidemia    Hypertension    Myocardial infarction (HCC)    1970   Prostate cancer (HCC)    Stroke (HCC)    Tobacco abuse    Past Surgical History:  Procedure Laterality Date   BACK SURGERY  1988   CARDIAC CATHETERIZATION  2004   stents   CATARACT EXTRACTION W/PHACO Right 04/04/2022   Procedure: CATARACT EXTRACTION PHACO AND INTRAOCULAR LENS PLACEMENT (IOC);  Surgeon: Fabio Pierce, MD;  Location: [facility];  Service: Ophthalmology;  Laterality: Right;  CDE: 10.63   COLONOSCOPY  ?  Date   COLONOSCOPY WITH PROPOFOL N/A 07/14/2021   Procedure: COLONOSCOPY WITH PROPOFOL;  Surgeon: Lanelle Bal, DO;  [facility]: [facility];  Service: Endoscopy;  Laterality: N/A;  8:15am, asa 3   IM NAILING HUMERUS Left    INGUINAL HERNIA REPAIR     ORIF-unknown fracture     POLYPECTOMY  07/14/2021   Procedure: POLYPECTOMY;  Surgeon: Lanelle Bal, DO;  [facility];  Service: Endoscopy;;   PROSTATE BIOPSY     ROTATOR CUFF REPAIR     Patient Active Problem List   Diagnosis Date Noted   Prolapsed  internal hemorrhoids, grade 3 05/26/2022   Grade II hemorrhoids 10/21/2021   Hemorrhoids 09/30/2021   Bradycardia 08/16/2021   Hypotension 08/16/2021   Aortic aneurysm (HCC) 08/16/2021   AKI (acute kidney injury) (HCC) 08/16/2021   Rectal pain 08/10/2021   Erectile dysfunction due to arterial insufficiency 09/13/2019   Weak urinary stream 09/13/2019   Benign prostatic hyperplasia with urinary obstruction    Depression with anxiety    Stroke-like symptoms 01/30/2019   Stroke (HCC) 01/30/2019   Malignant neoplasm of prostate (HCC) 04/22/2018   Chest tightness 05/04/2016   Dyspnea on exertion 05/04/2016   Coronary artery disease involving native coronary artery of native heart without angina pectoris    Hyperlipidemia    Tobacco abuse    Deep vein thrombosis (HCC)    Gastroesophageal reflux disease    Hypertension    OSTEOARTHRITIS 02/14/2006    PCP: Carylon Perches, MD  REFERRING PROVIDER: Vickki Hearing, MD  REFERRING DIAG: [phone] (ICD-10-CM) - Degenerative lumbar spinal stenosis  Rationale for Evaluation and Treatment: Rehabilitation  THERAPY DIAG:  Muscle weakness (generalized)  Other abnormalities of gait and mobility  Unsteadiness on feet  ONSET DATE: ~2 years ago  SUBJECTIVE:  SUBJECTIVE STATEMENT: Pt states he cracked his ribs ~2 weeks ago during a fall (States he picked up a piece of wood and "turned loose") -- is getting a follow up for this. Pt states he is coming to see PT for his legs. States "they don't work." Pt reports his legs drag. Pt reports it started ~2 years ago after he had a stroke. States most of the time his R LE is most affected. Reports it has affected his balance -- mostly falls to the side. "When I get up I get dizzy."  PERTINENT HISTORY:  CVA affecting R  side  PAIN:  Are you having pain? Yes: NPRS scale: 6-8/10 Pain location: L ribs to neck Pain description: Aching Aggravating factors: stays sore Relieving factors: has been using advil  PRECAUTIONS: Fall  RED FLAGS: None   WEIGHT BEARING RESTRICTIONS: No  FALLS:  Has patient fallen in last 6 months? Yes. 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"  PLOF: Independent  PATIENT GOALS: "I wanna run/walk faster", "work on Technical brewer  NEXT MD VISIT: not indicated on file  OBJECTIVE:   DIAGNOSTIC FINDINGS:  Getting x-rays for ribs today  PATIENT SURVEYS:  Did not assess  SCREENING FOR RED FLAGS: Bowel or bladder incontinence: No Spinal tumors: No Cauda equina syndrome: No Compression fracture: No Abdominal aneurysm: No  COGNITION: Overall cognitive status:  Aphasia      SENSATION: WFL  MUSCLE LENGTH: Did not assess  POSTURE: No Significant postural limitations  PALPATION: No significant tenderness  LUMBAR ROM:   AROM eval  Flexion   Extension   Right lateral flexion   Left lateral flexion   Right rotation   Left rotation    (Blank rows = not tested)  LOWER EXTREMITY ROM:     Active  Right eval Left eval  Hip flexion    Hip extension    Hip abduction    Hip adduction    Hip internal rotation    Hip external rotation    Knee flexion    Knee extension    Ankle dorsiflexion    Ankle plantarflexion    Ankle inversion    Ankle eversion     (Blank rows = not tested)  LOWER EXTREMITY MMT:    MMT Right eval Left eval  Hip flexion 4- 4+  Hip extension 3- 3  Hip abduction 3+ 3+  Hip adduction    Hip internal rotation    Hip external rotation    Knee flexion 5 5  Knee extension 5 5  Ankle dorsiflexion    Ankle  plantarflexion    Ankle inversion    Ankle eversion     (Blank rows = not tested)  LUMBAR SPECIAL TESTS:  Did not assess  FUNCTIONAL TESTS:  5 times sit to stand: 14.83 sec -- no hands but used back of his legs against chair for stability Dynamic Gait Index: 17/24  [facility] PT Assessment - 09/15/22 0001       Standardized Balance Assessment   Standardized Balance Assessment Dynamic Gait Index      Dynamic Gait Index   Level Surface Mild Impairment    Change in Gait Speed Mild Impairment    Gait with Horizontal Head Turns Mild Impairment    Gait with Vertical  Head Turns Mild Impairment    Gait and Pivot Turn Mild Impairment    Step Over Obstacle Mild Impairment    Step Around Obstacles Normal    Steps Mild Impairment    Total Score 17    DGI comment: performs steps with step to pattern but does not require rail             Berg Balance Test: TBA  GAIT: Distance walked: 100' Assistive device utilized: None Level of assistance: Complete Independence Comments: Decreased bilat knee flexion during gait -- maintains both knees primarily in extension, decreased bilat step length, widened BOS.  Gait speed of 0.3 m/s  TODAY'S TREATMENT:                                                                                                                              DATE: 09/15/22 See HEP below   PATIENT EDUCATION:  Education details: Exam findings, POC, initial HEP Person educated: Patient Education method: Explanation, Demonstration, and Handouts Education comprehension: verbalized understanding, returned demonstration, and needs further education  HOME EXERCISE PROGRAM: Access Code: [email or web address] URL: [email or web address] Date: 09/15/2022 Prepared by: Vernon Prey April Kirstie Peri  Exercises - Heel Toe Raises with Counter Support  - 1 x daily - 7 x weekly - 2 sets - 10 reps - Standing Hip Abduction with Counter Support  - 1 x daily - 7 x weekly - 2 sets - 10 reps -  Standing Hip Extension with Counter Support  - 1 x daily - 7 x weekly - 2 sets - 10 reps - Gaze Stability (VOR) x1 Feet Together on Firm Ground With Horizontal Head Turns  - 1 x daily - 7 x weekly - 1 sets - 10 reps - Gaze Stability (VOR) x1 Feet Together on Firm Ground With Vertical Head Turns  - 1 x daily - 7 x weekly - 1 sets - 10 reps  ASSESSMENT:  CLINICAL IMPRESSION: Patient is a 82 y.o. M who was seen today for physical therapy evaluation and treatment for bilat LE weakness and unsteadiness. PMH significant for prior CVA. Assessment significant for R>L hip weakness, decreased balance and gait instability affecting safe home and community ambulation. Pt with some noted mild dizziness during bed mobility/rolling and with walking/head turns indicating possible vestibular component to his decreased balance. No nystagmus seen. Pt is a high risk of falls based on his DGI. Pt would highly benefit from PT to improve on these impairments to decrease his fall risk.   OBJECTIVE IMPAIRMENTS: Abnormal gait, decreased activity tolerance, decreased balance, decreased mobility, difficulty walking, decreased ROM, decreased strength, dizziness, improper body mechanics, postural dysfunction, and pain.   ACTIVITY LIMITATIONS: bending, squatting, stairs, transfers, and locomotion level  PARTICIPATION LIMITATIONS: cleaning, laundry, shopping, community activity, and yard work  PERSONAL FACTORS: Age, Fitness, Past/current experiences, and Time since onset of injury/illness/exacerbation are also affecting patient's functional outcome.   REHAB POTENTIAL: Good  CLINICAL DECISION MAKING: Evolving/moderate complexity  EVALUATION COMPLEXITY: Moderate   GOALS: Goals reviewed with patient? Yes  SHORT TERM GOALS: Target date: 10/13/2022   Pt will be ind with initial HEP Baseline: Goal status: INITIAL  2.  Pt will participate in full Berg Balance assessment Baseline:  Goal status: INITIAL  3.  Pt will  improve 5x STS to </=13 sec without using back of legs for increased stability Baseline:  Goal status: INITIAL   LONG TERM GOALS: Target date: 11/10/2022   Pt will be ind with management and progression of HEP Baseline:  Goal status: INITIAL  2.  Pt will have improved DGI to >/=22/24 for decreased fall risk Baseline:  Goal status: INITIAL  3.  Pt will have improved gait speed to at least 0.8 m/s to be considered a safe community ambulator and per pt goals Baseline:  Goal status: INITIAL  4.  Pt will have improved Berg Balance Test score by at least 8 points to demo MCID Baseline:  Goal status: INITIAL   PLAN:  PT FREQUENCY: 2x/week  PT DURATION: 8 weeks  PLANNED INTERVENTIONS: Therapeutic exercises, Therapeutic activity, Neuromuscular re-education, Balance training, Gait training, Patient/Family education, Self Care, Joint mobilization, Stair training, Vestibular training, Dry Needling, Electrical stimulation, Spinal mobilization, Cryotherapy, Moist heat, Taping, Ionotophoresis 4mg /ml Dexamethasone, Manual therapy, and Re-evaluation.  PLAN FOR NEXT SESSION: Assess response to HEP. Assess Berg Balance. Work on hip strengthening, balance/stability, gait.    Christiane Sistare April Ma L Markeda Narvaez, PT 09/15/2022, 4:16 PM

## 2022-09-20 ENCOUNTER — Encounter (HOSPITAL_COMMUNITY): Payer: Medicare HMO | Admitting: Physical Therapy

## 2022-09-20 ENCOUNTER — Telehealth (HOSPITAL_COMMUNITY): Payer: Self-pay | Admitting: Physical Therapy

## 2022-09-20 NOTE — Telephone Encounter (Signed)
Called pt in regards to missed appointment #1. Left voicemail asking for pt to call to cancel or reschedule as needed. Reminded pt of his next appointment this week.  Camora Tremain April Ma Alphonsa Overall, PT

## 2022-09-20 NOTE — Therapy (Deleted)
OUTPATIENT PHYSICAL THERAPY THORACOLUMBAR EVALUATION   Patient Name: Alex Lara. MRN: [id number] DOB:[birth year], 82 y.o., male Today's Date: 09/20/2022  END OF SESSION:    Past Medical History:  Diagnosis Date   Anxiety and depression    Arteriosclerotic cardiovascular disease (ASCVD)    06/2008: DES to the RCA and LAD   Blood clotting disorder (HCC)    Carpal tunnel syndrome    Chronic anticoagulation    Deep vein thrombosis (HCC)    Degenerative joint disease    Gastroesophageal reflux disease    Hyperlipidemia    Hypertension    Myocardial infarction (HCC)    1970   Prostate cancer (HCC)    Stroke (HCC)    Tobacco abuse    Past Surgical History:  Procedure Laterality Date   BACK SURGERY  1988   CARDIAC CATHETERIZATION  2004   stents   CATARACT EXTRACTION W/PHACO Right 04/04/2022   Procedure: CATARACT EXTRACTION PHACO AND INTRAOCULAR LENS PLACEMENT (IOC);  Surgeon: Fabio Pierce, MD;  Location: [facility];  Service: Ophthalmology;  Laterality: Right;  CDE: 10.63   COLONOSCOPY  ?  Date   COLONOSCOPY WITH PROPOFOL N/A 07/14/2021   Procedure: COLONOSCOPY WITH PROPOFOL;  Surgeon: Lanelle Bal, DO;  Location: [facility];  Service: Endoscopy;  Laterality: N/A;  8:15am, asa 3   IM NAILING HUMERUS Left    INGUINAL HERNIA REPAIR     ORIF-unknown fracture     POLYPECTOMY  07/14/2021   Procedure: POLYPECTOMY;  Surgeon: Lanelle Bal, DO;  Location: [facility];  Service: Endoscopy;;   PROSTATE BIOPSY     ROTATOR CUFF REPAIR     Patient Active Problem List   Diagnosis Date Noted   Prolapsed internal hemorrhoids, grade 3 05/26/2022   Grade II hemorrhoids 10/21/2021   Hemorrhoids 09/30/2021   Bradycardia 08/16/2021   Hypotension 08/16/2021   Aortic aneurysm (HCC) 08/16/2021   AKI (acute kidney injury) (HCC) 08/16/2021   Rectal pain 08/10/2021   Erectile dysfunction due to arterial insufficiency 09/13/2019   Weak urinary stream 09/13/2019   Benign  prostatic hyperplasia with urinary obstruction    Depression with anxiety    Stroke-like symptoms 01/30/2019   Stroke (HCC) 01/30/2019   Malignant neoplasm of prostate (HCC) 04/22/2018   Chest tightness 05/04/2016   Dyspnea on exertion 05/04/2016   Coronary artery disease involving native coronary artery of native heart without angina pectoris    Hyperlipidemia    Tobacco abuse    Deep vein thrombosis (HCC)    Gastroesophageal reflux disease    Hypertension    OSTEOARTHRITIS 02/14/2006    PCP: Carylon Perches, MD  REFERRING PROVIDER: Vickki Hearing, MD  REFERRING DIAG: [phone] (ICD-10-CM) - Degenerative lumbar spinal stenosis  Rationale for Evaluation and Treatment: Rehabilitation  THERAPY DIAG:  No diagnosis found.  ONSET DATE: ~2 years ago  SUBJECTIVE:  SUBJECTIVE STATEMENT: ***   From eval: Pt states he cracked his ribs ~2 weeks ago during a fall (States he picked up a piece of wood and "turned loose") -- is getting a follow up for this. Pt states he is coming to see PT for his legs. States "they don't work." Pt reports his legs drag. Pt reports it started ~2 years ago after he had a stroke. States most of the time his R LE is most affected. Reports it has affected his balance -- mostly falls to the side. "When I get up I get dizzy."  PERTINENT HISTORY:  CVA affecting R side  PAIN:  Are you having pain? Yes: NPRS scale: 6-8/10 Pain location: L ribs to neck Pain description: Aching Aggravating factors: stays sore Relieving factors: has been using advil  PRECAUTIONS: Fall  RED FLAGS: None   WEIGHT BEARING RESTRICTIONS: No  FALLS:  Has patient fallen in last 6 months? Yes. 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"  PLOF: Independent  PATIENT GOALS: "I wanna run/walk faster", "work on Technical brewer  NEXT MD VISIT: not indicated on file  OBJECTIVE:   DIAGNOSTIC FINDINGS:  Getting x-rays for ribs today  PATIENT SURVEYS:  Did not assess  COGNITION: Overall cognitive status:  Aphasia     MUSCLE LENGTH: Did not assess  POSTURE: No Significant postural limitations  PALPATION: No significant tenderness  LUMBAR ROM:   AROM eval  Flexion   Extension   Right lateral flexion   Left lateral flexion   Right rotation   Left rotation    (Blank rows = not tested)  LOWER EXTREMITY ROM:     Active  Right eval Left eval  Hip flexion    Hip extension    Hip abduction    Hip adduction    Hip internal rotation    Hip external rotation    Knee flexion    Knee extension    Ankle dorsiflexion    Ankle plantarflexion    Ankle inversion    Ankle eversion     (Blank rows = not tested)  LOWER EXTREMITY MMT:    MMT Right eval Left eval  Hip flexion 4- 4+  Hip extension 3- 3  Hip abduction 3+ 3+  Hip adduction    Hip internal rotation    Hip external rotation    Knee flexion 5 5  Knee extension 5 5  Ankle dorsiflexion    Ankle plantarflexion    Ankle inversion    Ankle eversion     (Blank rows = not tested)  LUMBAR SPECIAL TESTS:  Did not assess  FUNCTIONAL TESTS:  5 times sit to stand: 14.83 sec -- no hands but used back of his legs against chair for stability Dynamic Gait Index: 17/24    Berg Balance Test: TBA  GAIT: Distance walked: 100' Assistive device utilized: None Level of assistance: Complete Independence Comments: Decreased bilat knee flexion during gait -- maintains both knees primarily in extension, decreased bilat step length, widened BOS.  Gait speed of 0.3 m/s  TODAY'S  TREATMENT:  DATE: 09/15/22 See HEP below   PATIENT EDUCATION:  Education details: Exam findings, POC, initial HEP Person educated: Patient Education method: Explanation, Demonstration, and Handouts Education comprehension: verbalized understanding, returned demonstration, and needs further education  HOME EXERCISE PROGRAM: Access Code: [email or web address] URL: [email or web address] Date: 09/15/2022 Prepared by: Vernon Prey April Kirstie Peri  Exercises - Heel Toe Raises with Counter Support  - 1 x daily - 7 x weekly - 2 sets - 10 reps - Standing Hip Abduction with Counter Support  - 1 x daily - 7 x weekly - 2 sets - 10 reps - Standing Hip Extension with Counter Support  - 1 x daily - 7 x weekly - 2 sets - 10 reps - Gaze Stability (VOR) x1 Feet Together on Firm Ground With Horizontal Head Turns  - 1 x daily - 7 x weekly - 1 sets - 10 reps - Gaze Stability (VOR) x1 Feet Together on Firm Ground With Vertical Head Turns  - 1 x daily - 7 x weekly - 1 sets - 10 reps  ASSESSMENT:  CLINICAL IMPRESSION: ***  Patient is a 82 y.o. M who was seen today for physical therapy evaluation and treatment for bilat LE weakness and unsteadiness. PMH significant for prior CVA. Assessment significant for R>L hip weakness, decreased balance and gait instability affecting safe home and community ambulation. Pt with some noted mild dizziness during bed mobility/rolling and with walking/head turns indicating possible vestibular component to his decreased balance. No nystagmus seen. Pt is a high risk of falls based on his DGI. Pt would highly benefit from PT to improve on these impairments to decrease his fall risk.   OBJECTIVE IMPAIRMENTS: Abnormal gait, decreased activity tolerance, decreased balance, decreased mobility, difficulty walking, decreased ROM, decreased strength, dizziness,  improper body mechanics, postural dysfunction, and pain.   ACTIVITY LIMITATIONS: bending, squatting, stairs, transfers, and locomotion level  PARTICIPATION LIMITATIONS: cleaning, laundry, shopping, community activity, and yard work  PERSONAL FACTORS: Age, Fitness, Past/current experiences, and Time since onset of injury/illness/exacerbation are also affecting patient's functional outcome.   REHAB POTENTIAL: Good  CLINICAL DECISION MAKING: Evolving/moderate complexity  EVALUATION COMPLEXITY: Moderate   GOALS: Goals reviewed with patient? Yes  SHORT TERM GOALS: Target date: 10/13/2022   Pt will be ind with initial HEP Baseline: Goal status: INITIAL  2.  Pt will participate in full Berg Balance assessment Baseline:  Goal status: INITIAL  3.  Pt will improve 5x STS to </=13 sec without using back of legs for increased stability Baseline:  Goal status: INITIAL   LONG TERM GOALS: Target date: 11/10/2022   Pt will be ind with management and progression of HEP Baseline:  Goal status: INITIAL  2.  Pt will have improved DGI to >/=22/24 for decreased fall risk Baseline:  Goal status: INITIAL  3.  Pt will have improved gait speed to at least 0.8 m/s to be considered a safe community ambulator and per pt goals Baseline:  Goal status: INITIAL  4.  Pt will have improved Berg Balance Test score by at least 8 points to demo MCID Baseline:  Goal status: INITIAL   PLAN:  PT FREQUENCY: 2x/week  PT DURATION: 8 weeks  PLANNED INTERVENTIONS: Therapeutic exercises, Therapeutic activity, Neuromuscular re-education, Balance training, Gait training, Patient/Family education, Self Care, Joint mobilization, Stair training, Vestibular training, Dry Needling, Electrical stimulation, Spinal mobilization, Cryotherapy, Moist heat, Taping, Ionotophoresis 4mg /ml Dexamethasone, Manual therapy, and Re-evaluation.  PLAN FOR NEXT SESSION: Assess response to HEP. Assess Berg Balance. Work on hip  strengthening,  balance/stability, gait.    Thedford Bunton April Ma L Laini Urick, PT 09/20/2022, 12:51 PM

## 2022-09-22 ENCOUNTER — Encounter (HOSPITAL_COMMUNITY): Payer: Medicare HMO | Admitting: Physical Therapy

## 2022-09-27 ENCOUNTER — Ambulatory Visit (HOSPITAL_COMMUNITY): Payer: Medicare HMO | Admitting: Physical Therapy

## 2022-09-27 DIAGNOSIS — R2689 Other abnormalities of gait and mobility: Secondary | ICD-10-CM | POA: Diagnosis not present

## 2022-09-27 DIAGNOSIS — R2681 Unsteadiness on feet: Secondary | ICD-10-CM

## 2022-09-27 DIAGNOSIS — M6281 Muscle weakness (generalized): Secondary | ICD-10-CM

## 2022-09-27 DIAGNOSIS — M48061 Spinal stenosis, lumbar region without neurogenic claudication: Secondary | ICD-10-CM | POA: Diagnosis not present

## 2022-09-27 DIAGNOSIS — R52 Pain, unspecified: Secondary | ICD-10-CM | POA: Diagnosis not present

## 2022-09-27 NOTE — Therapy (Signed)
OUTPATIENT PHYSICAL THERAPY TREATMENT   Patient Name: Alex Lara. MRN: [id number] DOB:February 07, 1940, 82 y.o., male Today's Date: 09/27/2022  END OF SESSION:  PT End of Session - 09/27/22 1303     Visit Number 2    Number of Visits 16    Date for PT Re-Evaluation 11/10/22    Authorization Type [employer]    PT Start Time 1302    PT Stop Time 1344    PT Time Calculation (min) 42 min    Activity Tolerance Patient tolerated treatment well    Behavior During Therapy WFL for tasks assessed/performed              Past Medical History:  Diagnosis Date   Anxiety and depression    Arteriosclerotic cardiovascular disease (ASCVD)    06/2008: DES to the RCA and LAD   Blood clotting disorder (HCC)    Carpal tunnel syndrome    Chronic anticoagulation    Deep vein thrombosis (HCC)    Degenerative joint disease    Gastroesophageal reflux disease    Hyperlipidemia    Hypertension    Myocardial infarction (HCC)    1970   Prostate cancer (HCC)    Stroke (HCC)    Tobacco abuse    Past Surgical History:  Procedure Laterality Date   BACK SURGERY  1988   CARDIAC CATHETERIZATION  2004   stents   CATARACT EXTRACTION W/PHACO Right 04/04/2022   Procedure: CATARACT EXTRACTION PHACO AND INTRAOCULAR LENS PLACEMENT (IOC);  Surgeon: Fabio Pierce, MD;  Location: [facility];  Service: Ophthalmology;  Laterality: Right;  CDE: 10.63   COLONOSCOPY  ?  Date   COLONOSCOPY WITH PROPOFOL N/A 07/14/2021   Procedure: COLONOSCOPY WITH PROPOFOL;  Surgeon: Lanelle Bal, DO;  Location: [facility];  Service: Endoscopy;  Laterality: N/A;  8:15am, asa 3   IM NAILING HUMERUS Left    INGUINAL HERNIA REPAIR     ORIF-unknown fracture     POLYPECTOMY  07/14/2021   Procedure: POLYPECTOMY;  Surgeon: Lanelle Bal, DO;  Location: [facility];  Service: Endoscopy;;   PROSTATE BIOPSY     ROTATOR CUFF REPAIR     Patient Active Problem List   Diagnosis Date Noted   Prolapsed internal  hemorrhoids, grade 3 05/26/2022   Grade II hemorrhoids 10/21/2021   Hemorrhoids 09/30/2021   Bradycardia 08/16/2021   Hypotension 08/16/2021   Aortic aneurysm (HCC) 08/16/2021   AKI (acute kidney injury) (HCC) 08/16/2021   Rectal pain 08/10/2021   Erectile dysfunction due to arterial insufficiency 09/13/2019   Weak urinary stream 09/13/2019   Benign prostatic hyperplasia with urinary obstruction    Depression with anxiety    Stroke-like symptoms 01/30/2019   Stroke (HCC) 01/30/2019   Malignant neoplasm of prostate (HCC) 04/22/2018   Chest tightness 05/04/2016   Dyspnea on exertion 05/04/2016   Coronary artery disease involving native coronary artery of native heart without angina pectoris    Hyperlipidemia    Tobacco abuse    Deep vein thrombosis (HCC)    Gastroesophageal reflux disease    Hypertension    OSTEOARTHRITIS 02/14/2006    PCP: Carylon Perches, MD  REFERRING PROVIDER: Vickki Hearing, MD  REFERRING DIAG: [id number] (ICD-10-CM) - Degenerative lumbar spinal stenosis  Rationale for Evaluation and Treatment: Rehabilitation  THERAPY DIAG:  Muscle weakness (generalized)  Other abnormalities of gait and mobility  Unsteadiness on feet  ONSET DATE: ~2 years ago  SUBJECTIVE:  SUBJECTIVE STATEMENT: Pt states he's been doing the HEP.  Difficulty getting his words out initially during session but later without difficulty. No pain or issues.   From eval: Pt states he cracked his ribs ~2 weeks ago during a fall (States he picked up a piece of wood and "turned loose") -- is getting a follow up for this. Pt states he is coming to see PT for his legs. States "they don't work." Pt reports his legs drag. Pt reports it started ~2 years ago after he had a stroke. States most of the time his R LE is  most affected. Reports it has affected his balance -- mostly falls to the side. "When I get up I get dizzy."  PERTINENT HISTORY:  CVA affecting R side  PAIN:  Are you having pain? No  PRECAUTIONS: Fall  RED FLAGS: None   WEIGHT BEARING RESTRICTIONS: No  FALLS:  Has patient fallen in last 6 months? Yes. 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"  PLOF: Independent  PATIENT GOALS: "I wanna run/walk faster", "work on Technical brewer  NEXT MD VISIT: not indicated on file  OBJECTIVE:   DIAGNOSTIC FINDINGS:  Getting x-rays for ribs today  PATIENT SURVEYS:  Did not assess  COGNITION: Overall cognitive status:  Aphasia     MUSCLE LENGTH: Did not assess  POSTURE: No Significant postural limitations  PALPATION: No significant tenderness  LUMBAR ROM:   AROM eval  Flexion   Extension   Right lateral flexion   Left lateral flexion   Right rotation   Left rotation    (Blank rows = not tested)   LOWER EXTREMITY MMT:    MMT Right eval Left eval  Hip flexion 4- 4+  Hip extension 3- 3  Hip abduction 3+ 3+  Hip adduction    Hip internal rotation    Hip external rotation    Knee flexion 5 5  Knee extension 5 5  Ankle dorsiflexion    Ankle plantarflexion    Ankle inversion    Ankle eversion     (Blank rows = not tested)  LUMBAR SPECIAL TESTS:  Did not assess  FUNCTIONAL TESTS:  5 times sit to stand: 14.83 sec -- no hands but used back of his legs against chair for stability Dynamic Gait Index: 17/24    Berg Balance Test: TBA  GAIT: Distance walked: 100' Assistive device utilized: None Level of assistance: Complete Independence Comments: Decreased bilat knee flexion during gait -- maintains both knees primarily in extension, decreased  bilat step length, widened BOS.  Gait speed of 0.3 m/s  TODAY'S TREATMENT:  DATE:  09/27/22 Goal review Standing:  heel and toe raises 20X  Hip abduction 2X10  Hip extension 2X10  Vectors 5X5" each with 1 HHA  Gaze stab with horiz head turns 10X each  Gaze stab with vertical head turns 10X each  Tandem stance 30"X 2 each LE lead without UE assist Sit to stands 10X no UE  09/15/22 See HEP below   PATIENT EDUCATION:  Education details: Exam findings, POC, initial HEP Person educated: Patient Education method: Explanation, Demonstration, and Handouts Education comprehension: verbalized understanding, returned demonstration, and needs further education  HOME EXERCISE PROGRAM: Access Code: [email or web address] URL: [email or web address] Date: 09/15/2022 Prepared by: Vernon Prey April Kirstie Peri  Exercises - Heel Toe Raises with Counter Support  - 1 x daily - 7 x weekly - 2 sets - 10 reps - Standing Hip Abduction with Counter Support  - 1 x daily - 7 x weekly - 2 sets - 10 reps - Standing Hip Extension with Counter Support  - 1 x daily - 7 x weekly - 2 sets - 10 reps - Gaze Stability (VOR) x1 Feet Together on Firm Ground With Horizontal Head Turns  - 1 x daily - 7 x weekly - 1 sets - 10 reps - Gaze Stability (VOR) x1 Feet Together on Firm Ground With Vertical Head Turns  - 1 x daily - 7 x weekly - 1 sets - 10 reps  ASSESSMENT:  CLINICAL IMPRESSION: Reviewed goals and POC moving forward.   Difficult to communicate with patient initially, taking increased time to gather thoughts and express correctly.  Pt able to recall 2/5 exercises given for HEP and required verbal and tactile cues for form and hold times to complete correctly.  Pt with general weakness observed but able to complete all tasks.  Pt will continue to benefit from PT to improve impairments and to  decrease his fall risk.   OBJECTIVE IMPAIRMENTS: Abnormal gait, decreased activity tolerance, decreased balance, decreased mobility, difficulty walking, decreased ROM, decreased strength, dizziness, improper body mechanics, postural dysfunction, and pain.   ACTIVITY LIMITATIONS: bending, squatting, stairs, transfers, and locomotion level  PARTICIPATION LIMITATIONS: cleaning, laundry, shopping, community activity, and yard work  PERSONAL FACTORS: Age, Fitness, Past/current experiences, and Time since onset of injury/illness/exacerbation are also affecting patient's functional outcome.   REHAB POTENTIAL: Good  CLINICAL DECISION MAKING: Evolving/moderate complexity  EVALUATION COMPLEXITY: Moderate   GOALS: Goals reviewed with patient? Yes  SHORT TERM GOALS: Target date: 10/13/2022   Pt will be ind with initial HEP Baseline: Goal status: IN PROGRESS  2.  Pt will participate in full Berg Balance assessment Baseline:  Goal status: IN PROGRESS  3.  Pt will improve 5x STS to </=13 sec without using back of legs for increased stability Baseline:  Goal status: IN PROGRESS   LONG TERM GOALS: Target date: 11/10/2022   Pt will be ind with management and progression of HEP Baseline:  Goal status: IN PROGRESS  2.  Pt will have improved DGI to >/=22/24 for decreased fall risk Baseline:  Goal status: IN PROGRESS  3.  Pt will have improved gait speed to at least 0.8 m/s to be considered a safe community ambulator and per pt goals Baseline:  Goal status: IN PROGRESS  4.  Pt will have improved Berg Balance Test score by at least 8 points to demo MCID Baseline:  Goal status: IN PROGRESS   PLAN:  PT FREQUENCY: 2x/week  PT DURATION: 8 weeks  PLANNED INTERVENTIONS: Therapeutic exercises, Therapeutic activity, Neuromuscular re-education,  Balance training, Gait training, Patient/Family education, Self Care, Joint mobilization, Stair training, Vestibular training, Dry Needling,  Electrical stimulation, Spinal mobilization, Cryotherapy, Moist heat, Taping, Ionotophoresis 4mg /ml Dexamethasone, Manual therapy, and Re-evaluation.  PLAN FOR NEXT SESSION: Assess Berg Balance next session. Work on hip strengthening, balance/stability, gait.    Emeline Gins B, PTA 09/27/2022, 2:17 PM

## 2022-09-29 ENCOUNTER — Ambulatory Visit (HOSPITAL_COMMUNITY): Payer: Medicare HMO | Admitting: Physical Therapy

## 2022-09-29 ENCOUNTER — Encounter (HOSPITAL_COMMUNITY): Payer: Self-pay | Admitting: Physical Therapy

## 2022-09-29 DIAGNOSIS — R52 Pain, unspecified: Secondary | ICD-10-CM | POA: Diagnosis not present

## 2022-09-29 DIAGNOSIS — R2681 Unsteadiness on feet: Secondary | ICD-10-CM

## 2022-09-29 DIAGNOSIS — M6281 Muscle weakness (generalized): Secondary | ICD-10-CM | POA: Diagnosis not present

## 2022-09-29 DIAGNOSIS — R2689 Other abnormalities of gait and mobility: Secondary | ICD-10-CM

## 2022-09-29 DIAGNOSIS — M48061 Spinal stenosis, lumbar region without neurogenic claudication: Secondary | ICD-10-CM | POA: Diagnosis not present

## 2022-09-29 NOTE — Therapy (Signed)
OUTPATIENT PHYSICAL THERAPY TREATMENT   Patient Name: Alex Lara. MRN: [id number] DOB:[birth year], 82 y.o., male Today's Date: 09/29/2022  END OF SESSION:  PT End of Session - 09/29/22 1300     Visit Number 3    Number of Visits 16    Date for PT Re-Evaluation 11/10/22    Authorization Type [employer]    PT Start Time 1301    PT Stop Time 1345    PT Time Calculation (min) 44 min    Activity Tolerance Patient tolerated treatment well    Behavior During Therapy WFL for tasks assessed/performed              Past Medical History:  Diagnosis Date   Anxiety and depression    Arteriosclerotic cardiovascular disease (ASCVD)    06/2008: DES to the RCA and LAD   Blood clotting disorder (HCC)    Carpal tunnel syndrome    Chronic anticoagulation    Deep vein thrombosis (HCC)    Degenerative joint disease    Gastroesophageal reflux disease    Hyperlipidemia    Hypertension    Myocardial infarction (HCC)    1970   Prostate cancer (HCC)    Stroke (HCC)    Tobacco abuse    Past Surgical History:  Procedure Laterality Date   BACK SURGERY  1988   CARDIAC CATHETERIZATION  2004   stents   CATARACT EXTRACTION W/PHACO Right 04/04/2022   Procedure: CATARACT EXTRACTION PHACO AND INTRAOCULAR LENS PLACEMENT (IOC);  Surgeon: Fabio Pierce, MD;  Location: [facility];  Service: Ophthalmology;  Laterality: Right;  CDE: 10.63   COLONOSCOPY  ?  Date   COLONOSCOPY WITH PROPOFOL N/A 07/14/2021   Procedure: COLONOSCOPY WITH PROPOFOL;  Surgeon: Lanelle Bal, DO;  Location: [facility];  Service: Endoscopy;  Laterality: N/A;  8:15am, asa 3   IM NAILING HUMERUS Left    INGUINAL HERNIA REPAIR     ORIF-unknown fracture     POLYPECTOMY  07/14/2021   Procedure: POLYPECTOMY;  Surgeon: Lanelle Bal, DO;  Location: [facility];  Service: Endoscopy;;   PROSTATE BIOPSY     ROTATOR CUFF REPAIR     Patient Active Problem List   Diagnosis Date Noted   Prolapsed internal  hemorrhoids, grade 3 05/26/2022   Grade II hemorrhoids 10/21/2021   Hemorrhoids 09/30/2021   Bradycardia 08/16/2021   Hypotension 08/16/2021   Aortic aneurysm (HCC) 08/16/2021   AKI (acute kidney injury) (HCC) 08/16/2021   Rectal pain 08/10/2021   Erectile dysfunction due to arterial insufficiency 09/13/2019   Weak urinary stream 09/13/2019   Benign prostatic hyperplasia with urinary obstruction    Depression with anxiety    Stroke-like symptoms 01/30/2019   Stroke (HCC) 01/30/2019   Malignant neoplasm of prostate (HCC) 04/22/2018   Chest tightness 05/04/2016   Dyspnea on exertion 05/04/2016   Coronary artery disease involving native coronary artery of native heart without angina pectoris    Hyperlipidemia    Tobacco abuse    Deep vein thrombosis (HCC)    Gastroesophageal reflux disease    Hypertension    OSTEOARTHRITIS 02/14/2006    PCP: Carylon Perches, MD  REFERRING PROVIDER: Vickki Hearing, MD  REFERRING DIAG: [id number] (ICD-10-CM) - Degenerative lumbar spinal stenosis  Rationale for Evaluation and Treatment: Rehabilitation  THERAPY DIAG:  Muscle weakness (generalized)  Other abnormalities of gait and mobility  Unsteadiness on feet  ONSET DATE: ~2 years ago  SUBJECTIVE:  SUBJECTIVE STATEMENT: Pt reports his stomach isn't feeling so good today but took pepto. Pt states ribs hurt on and off. Has shoulder pain but plans to get shots the end of this month.   From eval: Pt states he cracked his ribs ~2 weeks ago during a fall (States he picked up a piece of wood and "turned loose") -- is getting a follow up for this. Pt states he is coming to see PT for his legs. States "they don't work." Pt reports his legs drag. Pt reports it started ~2 years ago after he had a stroke. States most of the  time his R LE is most affected. Reports it has affected his balance -- mostly falls to the side. "When I get up I get dizzy."  PERTINENT HISTORY:  CVA affecting R side with aphasia  PAIN:  Are you having pain? No  PRECAUTIONS: Fall  RED FLAGS: None   WEIGHT BEARING RESTRICTIONS: No  FALLS:  Has patient fallen in last 6 months? Yes. 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"  PLOF: Independent  PATIENT GOALS: "I wanna run/walk faster", "work on Technical brewer  NEXT MD VISIT: not indicated on file  OBJECTIVE:   DIAGNOSTIC FINDINGS:  Getting x-rays for ribs today  PATIENT SURVEYS:  Did not assess  COGNITION: Overall cognitive status:  Aphasia     MUSCLE LENGTH: Did not assess  POSTURE: No Significant postural limitations  PALPATION: No significant tenderness  LUMBAR ROM:   AROM eval  Flexion   Extension   Right lateral flexion   Left lateral flexion   Right rotation   Left rotation    (Blank rows = not tested)   LOWER EXTREMITY MMT:    MMT Right eval Left eval  Hip flexion 4- 4+  Hip extension 3- 3  Hip abduction 3+ 3+  Hip adduction    Hip internal rotation    Hip external rotation    Knee flexion 5 5  Knee extension 5 5  Ankle dorsiflexion    Ankle plantarflexion    Ankle inversion    Ankle eversion     (Blank rows = not tested)  LUMBAR SPECIAL TESTS:  Did not assess  FUNCTIONAL TESTS:  5 times sit to stand: 14.83 sec -- no hands but used back of his legs against chair for stability Dynamic Gait Index: 17/24 Berg Balance Test: 46/56  [facility] PT Assessment - 09/29/22 0001       Standardized Balance Assessment   Standardized Balance Assessment Berg Balance Test      Berg Balance Test   Sit to Stand Able to stand  without using hands and stabilize independently    Standing Unsupported Able to stand safely 2 minutes    Sitting with Back Unsupported but Feet Supported on Floor or Stool Able to sit safely and securely 2 minutes    Stand to Sit Controls descent by using hands    Transfers Able to transfer safely, definite need of hands    Standing Unsupported with Eyes Closed Able to stand 10 seconds safely    Standing Unsupported with Feet Together Able to place feet together independently and stand 1 minute safely    From Standing, Reach Forward with Outstretched Arm Can reach forward >12 cm safely (5")  From Standing Position, Pick up Object from Floor Able to pick up shoe safely and easily    From Standing Position, Turn to Look Behind Over each Shoulder Looks behind one side only/other side shows less weight shift    Turn 360 Degrees Able to turn 360 degrees safely but slowly    Standing Unsupported, Alternately Place Feet on Step/Stool Able to stand independently and complete 8 steps >20 seconds    Standing Unsupported, One Foot in Front Able to plae foot ahead of the other independently and hold 30 seconds    Standing on One Leg Able to lift leg independently and hold equal to or more than 3 seconds    Total Score 46                GAIT: Distance walked: 100' Assistive device utilized: None Level of assistance: Complete Independence Comments: Decreased bilat knee flexion during gait -- maintains both knees primarily in extension, decreased bilat step length, widened BOS.  Gait speed of 0.3 m/s  TODAY'S TREATMENT:                                                                                                                              DATE:  09/29/22 Nustep L4 x 6 min, UEs/LEs Standing  Heel raise 2x10 bilat; x10 single leg  Hip abduction red TB 2x10  Hip extension red TB 2x10  Palloff press red TB feet together 2x10  SLS 3x10" with intermittent UE support  Feet together VOR x1,  horizontal 2x10, vertical 2x10 Performed Berg Balance Test (see above)  09/27/22 Goal review Standing:  heel and toe raises 20X  Hip abduction 2X10  Hip extension 2X10  Vectors 5X5" each with 1 HHA  Gaze stab with horiz head turns 10X each  Gaze stab with vertical head turns 10X each  Tandem stance 30"X 2 each LE lead without UE assist Sit to stands 10X no UE  09/15/22 See HEP below   PATIENT EDUCATION:  Education details: Exam findings, POC, initial HEP Person educated: Patient Education method: Explanation, Demonstration, and Handouts Education comprehension: verbalized understanding, returned demonstration, and needs further education  HOME EXERCISE PROGRAM: Access Code: [email or web address] URL: [email or web address] Date: 09/29/2022 Prepared by: Vernon Prey April Kirstie Peri  Exercises - Gaze Stability (VOR) x1 Feet Together on Firm Ground With Horizontal Head Turns  - 1 x daily - 7 x weekly - 1 sets - 10 reps - Gaze Stability (VOR) x1 Feet Together on Firm Ground With Vertical Head Turns  - 1 x daily - 7 x weekly - 1 sets - 10 reps - Standing Hip Abduction with Resistance at Ankles and Counter Support  - 1 x daily - 7 x weekly - 2 sets - 10 reps - Standing Hip Extension with Resistance at Ankles and Counter Support  - 1 x daily - 7 x weekly - 2 sets - 10 reps - Single Leg Heel Raise with Counter Support  -  1 x daily - 7 x weekly - 2 sets - 10 reps - Standing 'L' Stretch at Counter  - 1 x daily - 7 x weekly - 2 sets - 30 sec hold  ASSESSMENT:  CLINICAL IMPRESSION: Progressed pt's strengthening to using red TB. Difficulty maintaining gaze stability with right head turns but this is improving. Performed Berg Balance Test and pt scored [phone] which is a borderline high fall risk (45/56 is the cutoff). Initiated core strengthening in standing to improve standing stability.   OBJECTIVE IMPAIRMENTS: Abnormal gait, decreased activity tolerance, decreased balance, decreased  mobility, difficulty walking, decreased ROM, decreased strength, dizziness, improper body mechanics, postural dysfunction, and pain.   ACTIVITY LIMITATIONS: bending, squatting, stairs, transfers, and locomotion level  PARTICIPATION LIMITATIONS: cleaning, laundry, shopping, community activity, and yard work  PERSONAL FACTORS: Age, Fitness, Past/current experiences, and Time since onset of injury/illness/exacerbation are also affecting patient's functional outcome.   REHAB POTENTIAL: Good  CLINICAL DECISION MAKING: Evolving/moderate complexity  EVALUATION COMPLEXITY: Moderate   GOALS: Goals reviewed with patient? Yes  SHORT TERM GOALS: Target date: 10/13/2022   Pt will be ind with initial HEP Baseline: Goal status: IN PROGRESS  2.  Pt will participate in full Berg Balance assessment Baseline:  Goal status: IN PROGRESS  3.  Pt will improve 5x STS to </=13 sec without using back of legs for increased stability Baseline:  Goal status: IN PROGRESS   LONG TERM GOALS: Target date: 11/10/2022   Pt will be ind with management and progression of HEP Baseline:  Goal status: IN PROGRESS  2.  Pt will have improved DGI to >/=22/24 for decreased fall risk Baseline:  Goal status: IN PROGRESS  3.  Pt will have improved gait speed to at least 0.8 m/s to be considered a safe community ambulator and per pt goals Baseline:  Goal status: IN PROGRESS  4.  Pt will have improved Berg Balance Test score by at least 8 points to demo MCID Baseline:  Goal status: IN PROGRESS   PLAN:  PT FREQUENCY: 2x/week  PT DURATION: 8 weeks  PLANNED INTERVENTIONS: Therapeutic exercises, Therapeutic activity, Neuromuscular re-education, Balance training, Gait training, Patient/Family education, Self Care, Joint mobilization, Stair training, Vestibular training, Dry Needling, Electrical stimulation, Spinal mobilization, Cryotherapy, Moist heat, Taping, Ionotophoresis 4mg /ml Dexamethasone, Manual  therapy, and Re-evaluation.  PLAN FOR NEXT SESSION: Work on core/hip strengthening, balance/stability, gait and walking speed.    Dalicia Kisner April Ma L Gwendalynn Eckstrom, PT 09/29/2022, 1:33 PM

## 2022-10-04 ENCOUNTER — Encounter (HOSPITAL_COMMUNITY): Payer: Medicare HMO | Admitting: Physical Therapy

## 2022-10-06 ENCOUNTER — Encounter (HOSPITAL_COMMUNITY): Payer: Medicare HMO | Admitting: Physical Therapy

## 2022-10-06 ENCOUNTER — Telehealth (HOSPITAL_COMMUNITY): Payer: Self-pay | Admitting: Physical Therapy

## 2022-10-06 NOTE — Telephone Encounter (Signed)
Called in regards to 2nd no show. Only able to get to voicemail. Left message to remind him of his next appointment and of the clinic's no-show/attendance policy.   Chelsi Warr April Ma Alphonsa Overall, PT

## 2022-10-10 ENCOUNTER — Ambulatory Visit: Payer: Medicare HMO | Admitting: Orthopedic Surgery

## 2022-10-10 DIAGNOSIS — M75111 Incomplete rotator cuff tear or rupture of right shoulder, not specified as traumatic: Secondary | ICD-10-CM

## 2022-10-10 DIAGNOSIS — M75122 Complete rotator cuff tear or rupture of left shoulder, not specified as traumatic: Secondary | ICD-10-CM | POA: Diagnosis not present

## 2022-10-10 MED ORDER — METHYLPREDNISOLONE ACETATE 40 MG/ML IJ SUSP
40.0000 mg | Freq: Once | INTRAMUSCULAR | Status: AC
Start: 2022-10-10 — End: 2022-10-10
  Administered 2022-10-10: 40 mg via INTRA_ARTICULAR

## 2022-10-10 NOTE — Addendum Note (Signed)
Addended byCaffie Damme on: 10/10/2022 11:39 AM   Modules accepted: Orders

## 2022-10-10 NOTE — Progress Notes (Signed)
Follow up injection requested   Encounter Diagnoses  Name Primary?   Nontraumatic incomplete tear of right rotator cuff Yes   Nontraumatic complete tear of left rotator cuff      Procedure injection right shoulder subacromial joint and left shoulder subacromial joint   procedure note the subacromial injection shoulder RIGHT  Verbal consent was obtained to inject the  RIGHT   Shoulder  Timeout was completed to confirm the injection site is a subacromial space of the  RIGHT  shoulder   Medication used Depo-Medrol 40 mg and lidocaine 1% 3 cc  Anesthesia was provided by ethyl chloride  The injection was performed in the RIGHT  posterior subacromial space. After pinning the skin with alcohol and anesthetized the skin with ethyl chloride the subacromial space was injected using a 20-gauge needle. There were no complications  Sterile dressing was applied.    Procedure note the subacromial injection shoulder left   Verbal consent was obtained to inject the  Left   Shoulder  Timeout was completed to confirm the injection site is a subacromial space of the  left  shoulder  Medication used Depo-Medrol 40 mg and lidocaine 1% 3 cc  Anesthesia was provided by ethyl chloride  The injection was performed in the left  posterior subacromial space. After pinning the skin with alcohol and anesthetized the skin with ethyl chloride the subacromial space was injected using a 20-gauge needle. There were no complications  Sterile dressing was applied.

## 2022-10-11 ENCOUNTER — Encounter (HOSPITAL_COMMUNITY): Payer: Medicare HMO | Admitting: Physical Therapy

## 2022-10-13 ENCOUNTER — Encounter (HOSPITAL_COMMUNITY): Payer: Medicare HMO | Admitting: Physical Therapy

## 2022-10-18 ENCOUNTER — Encounter (HOSPITAL_COMMUNITY): Payer: Medicare HMO

## 2022-10-18 ENCOUNTER — Encounter (HOSPITAL_COMMUNITY): Payer: Medicare HMO | Admitting: Physical Therapy

## 2022-10-20 ENCOUNTER — Encounter (HOSPITAL_COMMUNITY): Payer: Medicare HMO

## 2022-10-20 DIAGNOSIS — H26491 Other secondary cataract, right eye: Secondary | ICD-10-CM | POA: Diagnosis not present

## 2022-10-24 ENCOUNTER — Encounter (HOSPITAL_COMMUNITY): Payer: Medicare HMO

## 2022-10-25 ENCOUNTER — Encounter (HOSPITAL_COMMUNITY): Payer: Medicare HMO

## 2022-10-26 ENCOUNTER — Encounter (HOSPITAL_COMMUNITY): Payer: Medicare HMO

## 2022-11-01 ENCOUNTER — Ambulatory Visit (INDEPENDENT_AMBULATORY_CARE_PROVIDER_SITE_OTHER): Payer: Medicare HMO | Admitting: Gastroenterology

## 2022-11-01 VITALS — BP 152/71 | HR 55 | Temp 97.4°F | Ht 72.0 in | Wt 179.6 lb

## 2022-11-01 DIAGNOSIS — K649 Unspecified hemorrhoids: Secondary | ICD-10-CM

## 2022-11-01 DIAGNOSIS — R194 Change in bowel habit: Secondary | ICD-10-CM | POA: Insufficient documentation

## 2022-11-01 DIAGNOSIS — K5900 Constipation, unspecified: Secondary | ICD-10-CM | POA: Diagnosis not present

## 2022-11-01 DIAGNOSIS — K6289 Other specified diseases of anus and rectum: Secondary | ICD-10-CM

## 2022-11-01 DIAGNOSIS — R1310 Dysphagia, unspecified: Secondary | ICD-10-CM | POA: Diagnosis not present

## 2022-11-01 NOTE — Patient Instructions (Signed)
I have ordered a special xray of your esophagus.  Let's stop the preparation H. I am calling in a special compounded cream to [facility] that you will use three times a day for your bottom to help heal a possible tear there.   For constipation: stop the laxatives and stool softeners. Instead, let's try the samples of Trulance. Let me know how this works for you so we can send in a prescription!  We will see you in 6 weeks!  I enjoyed seeing you again today! I value our relationship and want to provide genuine, compassionate, and quality care. You may receive a survey regarding your visit with me, and I welcome your feedback! Thanks so much for taking the time to complete this. I look forward to seeing you again.      Gelene Mink, PhD, ANP-BC [facility]

## 2022-11-01 NOTE — Progress Notes (Signed)
Gastroenterology Office Note     Primary Care Physician:  Carylon Perches, MD  Primary Gastroenterologist: Dr. Marletta Lor   Chief Complaint   Chief Complaint  Patient presents with   Hemorrhoids    Has had banding done 3 times. Pt thinks he has another one and has trouble "getting started" having a BM     History of Present Illness   Alex Chasey. is a 82 y.o. male presenting today with a history of constipation, symptomatic hemorrhoids, banding X 3 in 2023. Last colonoscopy July 2023: non-bleeding internal hemorrhoids, sigmoid diverticulosis, one 5 mm polyp s/p removal. Sessile serrated adenoma. 5 year surveillance    First BM is hard then good to come out. Fiber has not been helpful. Takes dulcolax laxative every now and then. Takes 2 stool softeners in the morning and at night.   Prep h 2-3 times per day, hard to start BM then gets going, feels stool there but can't go. Itching/burning. Some discomforet when passing stool thenn improved.   Strangled with water, pills. Sometimes solid food dysphagia. Wife requesting non-invasive evaluation first.     Past Medical History:  Diagnosis Date   Anxiety and depression    Arteriosclerotic cardiovascular disease (ASCVD)    06/2008: DES to the RCA and LAD   Blood clotting disorder (HCC)    Carpal tunnel syndrome    Chronic anticoagulation    Deep vein thrombosis (HCC)    Degenerative joint disease    Gastroesophageal reflux disease    Hyperlipidemia    Hypertension    Myocardial infarction (HCC)    1970   Prostate cancer (HCC)    Stroke (HCC)    Tobacco abuse     Past Surgical History:  Procedure Laterality Date   BACK SURGERY  1988   CARDIAC CATHETERIZATION  2004   stents   CATARACT EXTRACTION W/PHACO Right 04/04/2022   Procedure: CATARACT EXTRACTION PHACO AND INTRAOCULAR LENS PLACEMENT (IOC);  Surgeon: Fabio Pierce, MD;  Location: [facility];  Service: Ophthalmology;  Laterality: Right;  CDE: 10.63    COLONOSCOPY  ?  Date   COLONOSCOPY WITH PROPOFOL N/A 07/14/2021   Procedure: COLONOSCOPY WITH PROPOFOL;  Surgeon: Lanelle Bal, DO;  [facility];  Service: Endoscopy;  Laterality: N/A;  8:15am, asa 3   IM NAILING HUMERUS Left    INGUINAL HERNIA REPAIR     ORIF-unknown fracture     POLYPECTOMY  07/14/2021   Procedure: POLYPECTOMY;  Surgeon: Lanelle Bal, DO;  [facility]: [facility];  Service: Endoscopy;;   PROSTATE BIOPSY     ROTATOR CUFF REPAIR      Current Outpatient Medications  Medication Sig Dispense Refill   acetaminophen (TYLENOL) 500 MG tablet Take 500 mg by mouth every 6 (six) hours as needed.     albuterol (VENTOLIN HFA) 108 (90 Base) MCG/ACT inhaler Inhale 2 puffs into the lungs every 4 (four) hours as needed for wheezing or shortness of breath.     aspirin EC 325 MG tablet Take 325 mg by mouth daily.     atorvastatin (LIPITOR) 40 MG tablet Take 1 tablet (40 mg total) by mouth daily at 6 PM. (Patient taking differently: Take 40 mg by mouth at bedtime.) 30 tablet 3   cholecalciferol (VITAMIN D3) 25 MCG (1000 UNIT) tablet Take 1,000 Units by mouth daily.     clonazePAM (KLONOPIN) 0.5 MG tablet Take 0.5 mg by mouth 3 (three) times daily as needed for  anxiety.     gabapentin (NEURONTIN) 100 MG capsule Take 1 capsule (100 mg total) by mouth 3 (three) times daily as needed. 90 capsule 2   hydrocortisone cream 1 % Apply 1 Application topically 2 (two) times daily. Preparation H, sometimes uses three times per day     Ibuprofen (ADVIL) 200 MG CAPS Take 1 capsule by mouth daily as needed.     losartan (COZAAR) 50 MG tablet Take 50 mg by mouth daily.     melatonin 5 MG TABS Take 20 mg by mouth at bedtime.     pantoprazole (PROTONIX) 40 MG tablet Take 1 tablet (40 mg total) by mouth daily. 30 tablet 11   SERTRALINE HCL PO Take 100 mg by mouth daily.     traMADol (ULTRAM-ER) 100 MG 24 hr tablet Take 100 mg by mouth 3 (three) times daily.     No current  facility-administered medications for this visit.    Allergies as of 11/01/2022 - Review Complete 11/01/2022  Allergen Reaction Noted   Codeine Nausea And Vomiting and Other (See Comments) 11/05/2013    Family History  Problem Relation Age of Onset   Coronary artery disease Father    Coronary artery disease Mother        died post-CABG   Heart disease Mother    Breast cancer Maternal Aunt    Lung cancer Cousin    Melanoma Daughter    Prostate cancer Neg Hx     Social History   Socioeconomic History   Marital status: Married    Spouse name: Not on file   Number of children: 3   Years of education: 12   Highest education level: Not on file  Occupational History    Comment: retired  Tobacco Use   Smoking status: Every Day    Current packs/day: 1.50    Average packs/day: 1.5 packs/day for 55.0 years (82.5 ttl pk-yrs)    Types: Cigarettes    Passive exposure: Current   Smokeless tobacco: Never  Vaping Use   Vaping status: Never Used  Substance and Sexual Activity   Alcohol use: Not Currently   Drug use: No   Sexual activity: Not Currently  Other Topics Concern   Not on file  Social History Narrative   Not on file   Social Determinants of Health   Financial Resource Strain: Not on file  Food Insecurity: Not on file  Transportation Needs: Not on file  Physical Activity: Not on file  Stress: Not on file  Social Connections: Not on file  Intimate Partner Violence: Not on file     Review of Systems   Gen: Denies any fever, chills, fatigue, weight loss, lack of appetite.  CV: Denies chest pain, heart palpitations, peripheral edema, syncope.  Resp: Denies shortness of breath at rest or with exertion. Denies wheezing or cough.  GI: see HPI GU : Denies urinary burning, urinary frequency, urinary hesitancy MS: Denies joint pain, muscle weakness, cramps, or limitation of movement.  Derm: Denies rash, itching, dry skin Psych: Denies depression, anxiety, memory  loss, and confusion Heme: Denies bruising, bleeding, and enlarged lymph nodes.   Physical Exam   BP (!) 152/71 (BP Location: Right Arm, Patient Position: Sitting, Cuff Size: Normal)   Pulse (!) 55   Temp (!) 97.4 F (36.3 C) (Temporal)   Ht 6' (1.829 m)   Wt 179 lb 9.6 oz (81.5 kg)   BMI 24.36 kg/m  General:   Alert and oriented. Pleasant and cooperative. Well-nourished  and well-developed.  Head:  Normocephalic and atraumatic. Eyes:  Without icterus Rectal:  External right anterior hemorrhoid tags, DRE with discomfort. Unable to complete adequate exam due to discomfort.  Msk:  Symmetrical without gross deformities. Normal posture. Extremities:  Without edema. Neurologic:  Alert and  oriented x4 Skin:  Intact without significant lesions or rashes. Psych:  Alert and cooperative. Normal mood and affect.   Assessment   Alex Stuedemann. is an 82 y.o. male presenting today with a history of constipation, symptomatic hemorrhoids, and dysphagia.  Constipation: without much improvement using OTC agents. Trial of Trulance once daily.  Hemorrhoids: s/p banding in the past but now symptoms with likely occult anal fissure. Stop preparation H. Start compounded [facility] cream with diltiazem and lidocaine. Could consider banding at next appt but need to address constipation.  Dysphagia: predominantly liquid and pills but some solid food as well. Suspect dysmotility underlying. Wife desires non-invasive approach to start. Will order BPE.    PLAN   [facility] cream with diltiazem and lidocaine Trulance samples provided BPE Follow-up in 6 weeks   Gelene Mink, PhD, [facility]

## 2022-11-03 ENCOUNTER — Telehealth: Payer: Self-pay

## 2022-11-03 NOTE — Telephone Encounter (Signed)
Alex Lara: Pt's wife phoned advising that he is having diarrhea taking Trulance. Has only taken it once. I advised for the pt to continue taking it so it can flush him out so he will not be constipated especially after a banding. Advised to call next week if it has not slowed up enough for them and I will send you a note regarding this.

## 2022-11-07 ENCOUNTER — Other Ambulatory Visit (HOSPITAL_COMMUNITY): Payer: Medicare HMO

## 2022-11-24 DIAGNOSIS — M1990 Unspecified osteoarthritis, unspecified site: Secondary | ICD-10-CM | POA: Diagnosis not present

## 2022-11-24 DIAGNOSIS — Z823 Family history of stroke: Secondary | ICD-10-CM | POA: Diagnosis not present

## 2022-11-24 DIAGNOSIS — Z23 Encounter for immunization: Secondary | ICD-10-CM | POA: Diagnosis not present

## 2022-11-28 ENCOUNTER — Telehealth: Payer: Self-pay | Admitting: *Deleted

## 2022-11-28 NOTE — Telephone Encounter (Signed)
Pt's wife left message wanting to reschedule BPE;  Let pt's wife Alona Bene (on [place]) know that BPE has been rescheduled until 11/30/22, arrive at 8:45 am to check in. Gave number to call and reschedule if unable to make appointment

## 2022-11-30 ENCOUNTER — Other Ambulatory Visit (HOSPITAL_COMMUNITY): Payer: Medicare HMO

## 2022-12-06 ENCOUNTER — Other Ambulatory Visit: Payer: Self-pay | Admitting: Orthopedic Surgery

## 2022-12-06 DIAGNOSIS — M48061 Spinal stenosis, lumbar region without neurogenic claudication: Secondary | ICD-10-CM

## 2022-12-07 ENCOUNTER — Ambulatory Visit (HOSPITAL_COMMUNITY): Payer: Medicare HMO

## 2022-12-13 ENCOUNTER — Encounter: Payer: Self-pay | Admitting: Gastroenterology

## 2022-12-13 ENCOUNTER — Encounter: Payer: Medicare HMO | Admitting: Gastroenterology

## 2022-12-19 ENCOUNTER — Other Ambulatory Visit (INDEPENDENT_AMBULATORY_CARE_PROVIDER_SITE_OTHER): Payer: Self-pay

## 2022-12-19 ENCOUNTER — Encounter: Payer: Self-pay | Admitting: Orthopedic Surgery

## 2022-12-19 ENCOUNTER — Ambulatory Visit: Payer: Medicare HMO | Admitting: Orthopedic Surgery

## 2022-12-19 VITALS — Ht 72.0 in | Wt 182.0 lb

## 2022-12-19 DIAGNOSIS — M25552 Pain in left hip: Secondary | ICD-10-CM | POA: Diagnosis not present

## 2022-12-19 DIAGNOSIS — M79672 Pain in left foot: Secondary | ICD-10-CM

## 2022-12-19 DIAGNOSIS — M25562 Pain in left knee: Secondary | ICD-10-CM | POA: Diagnosis not present

## 2022-12-19 DIAGNOSIS — Z8781 Personal history of (healed) traumatic fracture: Secondary | ICD-10-CM

## 2022-12-19 DIAGNOSIS — I2510 Atherosclerotic heart disease of native coronary artery without angina pectoris: Secondary | ICD-10-CM | POA: Diagnosis not present

## 2022-12-19 DIAGNOSIS — M75122 Complete rotator cuff tear or rupture of left shoulder, not specified as traumatic: Secondary | ICD-10-CM

## 2022-12-19 DIAGNOSIS — M5030 Other cervical disc degeneration, unspecified cervical region: Secondary | ICD-10-CM

## 2022-12-19 DIAGNOSIS — G8929 Other chronic pain: Secondary | ICD-10-CM

## 2022-12-19 DIAGNOSIS — S72302S Unspecified fracture of shaft of left femur, sequela: Secondary | ICD-10-CM

## 2022-12-19 DIAGNOSIS — M75111 Incomplete rotator cuff tear or rupture of right shoulder, not specified as traumatic: Secondary | ICD-10-CM

## 2022-12-19 MED ORDER — METHYLPREDNISOLONE ACETATE 40 MG/ML IJ SUSP
40.0000 mg | Freq: Once | INTRAMUSCULAR | Status: AC
  Administered 2022-12-19: 40 mg via INTRA_ARTICULAR

## 2022-12-19 NOTE — Progress Notes (Signed)
Encounter Diagnoses  Name Primary?   Pain in left hip    Chronic pain of left knee    Pain in left foot    Coronary artery disease involving native coronary artery of native heart without angina pectoris    DDD (degenerative disc disease), cervical Yes   Closed fracture of shaft of left femur, unspecified fracture morphology, sequela    Nontraumatic complete tear of left rotator cuff    Nontraumatic incomplete tear of right rotator cuff    Chief Complaint  Patient presents with   Hip Pain    Patient says he is having pain from the left hip all the way down to the first and last toe on the left foot    Ht 6' (1.829 m)   Wt 182 lb (82.6 kg)   BMI 24.68 kg/m   Tyre is tender in his great toe and's lesser digit #5 he is also having back tenderness  His left hip exam is normal as is his knee exam with no swelling his range of motion is 125 degrees with no joint line tenderness or McMurray's sign  His back is tender I think most of his problem is coming from his back about a year ago we x-rayed his back he has lumbar disc disease  Now he is hip exam was normal his implant long femoral cephalic medullary nail normal  Knee x-ray chondrocalcinosis mild OA  Foot exam normal  I think he has S1 root pathology  Recommend increased gabapentin to 100 mg 2 tablets at night  Return as needed  He also asked to have his shoulders injected  Procedure injection right shoulder subacromial joint and left shoulder subacromial joint   procedure note the subacromial injection shoulder RIGHT  Verbal consent was obtained to inject the  RIGHT   Shoulder  Timeout was completed to confirm the injection site is a subacromial space of the  RIGHT  shoulder   Medication used Depo-Medrol 40 mg and lidocaine 1% 3 cc  Anesthesia was provided by ethyl chloride  The injection was performed in the RIGHT  posterior subacromial space. After pinning the skin with alcohol and anesthetized the skin with  ethyl chloride the subacromial space was injected using a 20-gauge needle. There were no complications  Sterile dressing was applied.    Procedure note the subacromial injection shoulder left   Verbal consent was obtained to inject the  Left   Shoulder  Timeout was completed to confirm the injection site is a subacromial space of the  left  shoulder  Medication used Depo-Medrol 40 mg and lidocaine 1% 3 cc  Anesthesia was provided by ethyl chloride  The injection was performed in the left  posterior subacromial space. After pinning the skin with alcohol and anesthetized the skin with ethyl chloride the subacromial space was injected using a 20-gauge needle. There were no complications  Sterile dressing was applied.

## 2022-12-28 ENCOUNTER — Ambulatory Visit (HOSPITAL_COMMUNITY)
Admission: RE | Admit: 2022-12-28 | Discharge: 2022-12-28 | Disposition: A | Discharge status: Home / Self Care | Payer: Medicare HMO | Source: Ambulatory Visit | Attending: Gastroenterology | Admitting: Gastroenterology

## 2022-12-28 DIAGNOSIS — R1310 Dysphagia, unspecified: Secondary | ICD-10-CM | POA: Diagnosis not present

## 2022-12-28 DIAGNOSIS — K440 Diaphragmatic hernia with obstruction, without gangrene: Secondary | ICD-10-CM | POA: Diagnosis not present

## 2023-01-12 ENCOUNTER — Ambulatory Visit: Payer: Medicare HMO | Admitting: Orthopedic Surgery

## 2023-03-09 ENCOUNTER — Emergency Department (HOSPITAL_COMMUNITY): Payer: Medicare HMO

## 2023-03-09 ENCOUNTER — Emergency Department (HOSPITAL_COMMUNITY)
Admission: EM | Admit: 2023-03-09 | Discharge: 2023-03-09 | Disposition: A | Discharge status: Home / Self Care | Payer: Medicare HMO

## 2023-03-09 ENCOUNTER — Encounter (HOSPITAL_COMMUNITY): Payer: Self-pay

## 2023-03-09 ENCOUNTER — Other Ambulatory Visit: Payer: Self-pay

## 2023-03-09 ENCOUNTER — Ambulatory Visit
Admission: EM | Admit: 2023-03-09 | Discharge: 2023-03-09 | Disposition: A | Discharge status: Home / Self Care | Payer: Medicare HMO | Attending: Family Medicine | Admitting: Family Medicine

## 2023-03-09 DIAGNOSIS — R0602 Shortness of breath: Secondary | ICD-10-CM | POA: Diagnosis not present

## 2023-03-09 DIAGNOSIS — M545 Low back pain, unspecified: Secondary | ICD-10-CM | POA: Diagnosis not present

## 2023-03-09 DIAGNOSIS — N281 Cyst of kidney, acquired: Secondary | ICD-10-CM | POA: Diagnosis not present

## 2023-03-09 DIAGNOSIS — I714 Abdominal aortic aneurysm, without rupture, unspecified: Secondary | ICD-10-CM | POA: Diagnosis not present

## 2023-03-09 DIAGNOSIS — K5730 Diverticulosis of large intestine without perforation or abscess without bleeding: Secondary | ICD-10-CM | POA: Diagnosis not present

## 2023-03-09 DIAGNOSIS — R10A0 Flank pain, unspecified side: Secondary | ICD-10-CM

## 2023-03-09 DIAGNOSIS — R918 Other nonspecific abnormal finding of lung field: Secondary | ICD-10-CM | POA: Diagnosis not present

## 2023-03-09 DIAGNOSIS — R030 Elevated blood-pressure reading, without diagnosis of hypertension: Secondary | ICD-10-CM | POA: Diagnosis not present

## 2023-03-09 DIAGNOSIS — R5383 Other fatigue: Secondary | ICD-10-CM | POA: Diagnosis not present

## 2023-03-09 DIAGNOSIS — R079 Chest pain, unspecified: Secondary | ICD-10-CM | POA: Diagnosis not present

## 2023-03-09 DIAGNOSIS — R109 Unspecified abdominal pain: Secondary | ICD-10-CM | POA: Diagnosis not present

## 2023-03-09 DIAGNOSIS — J189 Pneumonia, unspecified organism: Secondary | ICD-10-CM | POA: Diagnosis not present

## 2023-03-09 DIAGNOSIS — K5900 Constipation, unspecified: Secondary | ICD-10-CM | POA: Diagnosis not present

## 2023-03-09 DIAGNOSIS — I10 Essential (primary) hypertension: Secondary | ICD-10-CM | POA: Diagnosis not present

## 2023-03-09 DIAGNOSIS — I159 Secondary hypertension, unspecified: Secondary | ICD-10-CM

## 2023-03-09 DIAGNOSIS — I7143 Infrarenal abdominal aortic aneurysm, without rupture: Secondary | ICD-10-CM | POA: Diagnosis not present

## 2023-03-09 DIAGNOSIS — Z79899 Other long term (current) drug therapy: Secondary | ICD-10-CM | POA: Insufficient documentation

## 2023-03-09 DIAGNOSIS — R0789 Other chest pain: Secondary | ICD-10-CM | POA: Diagnosis not present

## 2023-03-09 DIAGNOSIS — I2510 Atherosclerotic heart disease of native coronary artery without angina pectoris: Secondary | ICD-10-CM | POA: Insufficient documentation

## 2023-03-09 DIAGNOSIS — Z7982 Long term (current) use of aspirin: Secondary | ICD-10-CM | POA: Diagnosis not present

## 2023-03-09 DIAGNOSIS — R1030 Lower abdominal pain, unspecified: Secondary | ICD-10-CM | POA: Diagnosis not present

## 2023-03-09 DIAGNOSIS — J168 Pneumonia due to other specified infectious organisms: Secondary | ICD-10-CM | POA: Diagnosis not present

## 2023-03-09 LAB — COMPREHENSIVE METABOLIC PANEL
ALT: 18 U/L (ref 0–44)
AST: 23 U/L (ref 15–41)
Albumin: 4 g/dL (ref 3.5–5.0)
Alkaline Phosphatase: 53 U/L (ref 38–126)
Anion gap: 10 (ref 5–15)
BUN: 28 mg/dL — ABNORMAL HIGH (ref 8–23)
CO2: 22 mmol/L (ref 22–32)
Calcium: 9.1 mg/dL (ref 8.9–10.3)
Chloride: 103 mmol/L (ref 98–111)
Creatinine, Ser: 1.06 mg/dL (ref 0.61–1.24)
GFR, Estimated: >60 mL/min (ref >60)
Glucose, Bld: 100 mg/dL — ABNORMAL HIGH (ref 70–99)
Potassium: 4.2 mmol/L (ref 3.5–5.1)
Sodium: 135 mmol/L (ref 135–145)
Total Bilirubin: 0.6 mg/dL (ref 0.0–1.2)
Total Protein: 7.1 g/dL (ref 6.5–8.1)

## 2023-03-09 LAB — POCT URINALYSIS DIP (MANUAL ENTRY)
Bilirubin, UA: NEGATIVE
Blood, UA: NEGATIVE
Glucose, UA: NEGATIVE mg/dL
Ketones, POC UA: NEGATIVE mg/dL
Leukocytes, UA: NEGATIVE
Nitrite, UA: NEGATIVE
Protein Ur, POC: 30 mg/dL — AB
Spec Grav, UA: 1.02 (ref 1.010–1.025)
Urobilinogen, UA: 0.2 U/dL
pH, UA: 6 (ref 5.0–8.0)

## 2023-03-09 LAB — CBC WITH DIFFERENTIAL/PLATELET
Abs Immature Granulocytes: 0.03 10*3/uL (ref 0.00–0.07)
Basophils Absolute: 0.1 10*3/uL (ref 0.0–0.1)
Basophils Relative: 1 %
Eosinophils Absolute: 0.4 10*3/uL (ref 0.0–0.5)
Eosinophils Relative: 6 %
HCT: 35.3 % — ABNORMAL LOW (ref 39.0–52.0)
Hemoglobin: 12 g/dL — ABNORMAL LOW (ref 13.0–17.0)
Immature Granulocytes: 1 %
Lymphocytes Relative: 18 %
Lymphs Abs: 1.2 10*3/uL (ref 0.7–4.0)
MCH: 32.3 pg (ref 26.0–34.0)
MCHC: 34 g/dL (ref 30.0–36.0)
MCV: 95.1 fL (ref 80.0–100.0)
Monocytes Absolute: 0.6 10*3/uL (ref 0.1–1.0)
Monocytes Relative: 10 %
Neutro Abs: 4 10*3/uL (ref 1.7–7.7)
Neutrophils Relative %: 64 %
Platelets: 173 10*3/uL (ref 150–400)
RBC: 3.71 MIL/uL — ABNORMAL LOW (ref 4.22–5.81)
RDW: 15.1 % (ref 11.5–15.5)
WBC: 6.3 10*3/uL (ref 4.0–10.5)
nRBC: 0 % (ref 0.0–0.2)

## 2023-03-09 LAB — URINALYSIS, ROUTINE W REFLEX MICROSCOPIC
Bacteria, UA: NONE SEEN
Bilirubin Urine: NEGATIVE
Glucose, UA: NEGATIVE mg/dL
Hgb urine dipstick: NEGATIVE
Ketones, ur: NEGATIVE mg/dL
Leukocytes,Ua: NEGATIVE
Nitrite: NEGATIVE
Protein, ur: NEGATIVE mg/dL
Specific Gravity, Urine: 1.015 (ref 1.005–1.030)
pH: 5 (ref 5.0–8.0)

## 2023-03-09 LAB — LIPASE, BLOOD: Lipase: 41 U/L (ref 11–51)

## 2023-03-09 LAB — TROPONIN I (HIGH SENSITIVITY): Troponin I (High Sensitivity): 6 ng/L (ref <18)

## 2023-03-09 MED ORDER — HYDRALAZINE HCL 20 MG/ML IJ SOLN
5.0000 mg | Freq: Once | INTRAMUSCULAR | Status: AC
Start: 2023-03-09 — End: 2023-03-09
  Administered 2023-03-09: 5 mg via INTRAVENOUS
  Filled 2023-03-09: qty 1

## 2023-03-09 MED ORDER — IOHEXOL 300 MG/ML  SOLN
100.0000 mL | Freq: Once | INTRAMUSCULAR | Status: AC | PRN
  Administered 2023-03-09: 100 mL via INTRAVENOUS

## 2023-03-09 MED ORDER — LIDOCAINE 4 % EX PTCH: 1.0000 | MEDICATED_PATCH | CUTANEOUS | 0 refills | Status: DC

## 2023-03-09 MED ORDER — AMOXICILLIN-POT CLAVULANATE 875-125 MG PO TABS: 1.0000 | ORAL_TABLET | Freq: Two times a day (BID) | ORAL | 0 refills | Status: AC

## 2023-03-09 NOTE — Discharge Instructions (Addendum)
 Your CT scan showed pneumonia.  We are prescribing antibiotics.  It also showed that you have an aortic aneurysm measuring 3.5 cm.  You will need to follow-up with your primary doctor for subsequent ultrasound.  There also appeared to be an abnormality with your left sixth.  Please follow-up with your primary doctor.  We are prescribing a lidocaine patch.  Return to emerged part immediately if you develop fevers, chills, lightheadedness, passout, worsening chest pain, shortness of breath or any new or worsening symptoms that are concerning to you.

## 2023-03-09 NOTE — ED Provider Notes (Signed)
 Received signout from Dr. Rhae Hammock see their note for full HPI. Signout; Dispo pending CT scan with likely discharge home. Ct scan with  IMPRESSION:  1. Infiltration or atelectasis in the left lung base may represent  pneumonia. Cardiac enlargement.  2. 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  Recommend surveillance ultrasound in 3 years.  Reference: Journal of Vascular Surgery 67.1 (2018): 2-77. J Am Coll  Radiol 2013;10:789-794.  3. Aortic atherosclerosis.  4. Focal area of sclerosis in the left sixth rib may represent  healing fracture. Metastatic focus is less likely. Consider PSA  correlation if clinically indicated.  5. Mild bladder wall thickening may indicate cystitis. Correlate  with urinalysis.      Electronically Signed   Given location of symptoms will treat for PNA. Patient made aware of incidental findings.  He is hypertensive.  Did not take his home blood pressure medications.  Does not appear to have signs of endorgan damage and per ASAP guidelines will defer treatment outpatient. Stable for discharge.    Coral Spikes, DO 03/09/23 1709

## 2023-03-09 NOTE — ED Triage Notes (Signed)
 Pt reports left flank pain, pain in lower back and blood pressure has been elevated.

## 2023-03-09 NOTE — ED Triage Notes (Signed)
 Pt arrived via POV from Urgent Care after being advised to seek further evaluation and treatment for low HR, chest pain and elevated BP. Pt reports pain in his left chest/rib cage X 3 days.

## 2023-03-09 NOTE — ED Notes (Signed)
 EPD aware of HR in 40's.

## 2023-03-09 NOTE — ED Notes (Signed)
 Pt also reports bilateral posterior flank pain.

## 2023-03-09 NOTE — ED Notes (Addendum)
 Called for Pt from waiting room. Pt currently in bathroom.

## 2023-03-09 NOTE — ED Provider Notes (Signed)
 [place] EMERGENCY DEPARTMENT AT [facility] Provider Note   CSN: [id number] Arrival date & time: 03/09/23  1253     History  Chief Complaint  Patient presents with   Chest Pain    Ranjit Ashurst. is a 83 y.o. male.  83 year old male with past medical history of coronary artery disease, hypertension, and hyperlipidemia presenting to the emergency department today with chest/abdominal pain.  The patient is somewhat of a poor historian.  He states he is having chest pain but seems to be pointing more towards his left upper quadrant and left thoracic paraspinal region.  Patient states has been going now for the past 2 days.  He denies any associated nausea or vomiting.  He has had a cough that has been nonproductive.  He denies any fevers.  Apparently his not had a bowel movement in a few days which he initially did not volunteer when asked but his family at bedside did state that he has tried multiple medications for this.  He was brought in today for further evaluation regarding this.  He denies a history of DVT or pulmonary obliterative, recent surgeries, recent travel.   Chest Pain Associated symptoms: abdominal pain   Associated symptoms: no vomiting        Home Medications Prior to Admission medications   Medication Sig Start Date End Date Taking? Authorizing Provider  acetaminophen (TYLENOL) 500 MG tablet Take 500 mg by mouth every 6 (six) hours as needed.   Yes [provider]  albuterol (VENTOLIN HFA) 108 (90 Base) MCG/ACT inhaler Inhale 2 puffs into the lungs every 4 (four) hours as needed for wheezing or shortness of breath. 05/30/21  Yes [provider]  aspirin EC 325 MG tablet Take 325 mg by mouth daily.   Yes [provider]  atorvastatin (LIPITOR) 40 MG tablet Take 1 tablet (40 mg total) by mouth daily at 6 PM. Patient taking differently: Take 40 mg by mouth at bedtime. 01/31/19  Yes Vassie Loll, MD  cholecalciferol (VITAMIN  D3) 25 MCG (1000 UNIT) tablet Take 1,000 Units by mouth daily.   Yes [provider]  clonazePAM (KLONOPIN) 0.5 MG tablet Take 0.5 mg by mouth 3 (three) times daily as needed for anxiety. 05/19/21  Yes [provider]  gabapentin (NEURONTIN) 100 MG capsule TAKE 1 CAPSULE(100 MG) BY MOUTH THREE TIMES DAILY AS NEEDED Patient taking differently: Take 100 mg by mouth at bedtime. 12/07/22  Yes Vickki Hearing, MD  hydrocortisone cream 1 % Apply 1 Application topically 2 (two) times daily. Preparation H, sometimes uses three times per day   Yes [provider]  Ibuprofen (ADVIL) 200 MG CAPS Take 1 capsule by mouth daily as needed.   Yes [provider]  losartan (COZAAR) 50 MG tablet Take 50 mg by mouth daily. 04/20/22  Yes [provider]  melatonin 5 MG TABS Take 20 mg by mouth at bedtime.   Yes [provider]  pantoprazole (PROTONIX) 40 MG tablet Take 1 tablet (40 mg total) by mouth daily. 04/20/22 04/20/23 Yes Lanelle Bal, DO  sertraline (ZOLOFT) 100 MG tablet SMARTSIG:1.0 Tablet(s) By Mouth Daily 12/08/22  Yes [provider]  traMADol (ULTRAM) 50 MG tablet Take 50 mg by mouth 3 (three) times daily as needed. 02/11/23  Yes [provider]      Allergies    Codeine    Review of Systems   Review of Systems  Cardiovascular:  Positive for chest pain.  Gastrointestinal:  Positive for abdominal pain. Negative for vomiting.  All other systems reviewed and are negative.   Physical Exam Updated Vital Signs BP (!) 192/84   Pulse (!) 45   Temp 97.9 F (36.6 C) (Oral)   Resp 17   Ht 6' (1.829 m)   Wt 82.5 kg   SpO2 98%   BMI 24.67 kg/m  Physical Exam Vitals and nursing note reviewed.   Gen: NAD Eyes: PERRL, EOMI HEENT: no oropharyngeal swelling Neck: trachea midline Resp: clear to auscultation bilaterally Card: RRR, no murmurs, rubs, or gallops Abd: The patient has tender over the left upper quadrant and left  periumbilical region Extremities: no calf tenderness, no edema Vascular: 2+ radial pulses bilaterally, 2+ DP pulses bilaterally Skin: no rashes Psyc: acting appropriately   ED Results / Procedures / Treatments   Labs (all labs ordered are listed, but only abnormal results are displayed) Labs Reviewed  COMPREHENSIVE METABOLIC PANEL - Abnormal; Notable for the following components:      Result Value   Glucose, Bld 100 (*)    BUN 28 (*)    All other components within normal limits  CBC WITH DIFFERENTIAL/PLATELET - Abnormal; Notable for the following components:   RBC 3.71 (*)    Hemoglobin 12.0 (*)    HCT 35.3 (*)    All other components within normal limits  URINALYSIS, ROUTINE W REFLEX MICROSCOPIC  LIPASE, BLOOD  TROPONIN I (HIGH SENSITIVITY)    EKG None  Radiology DG Chest Port 1 View Result Date: 03/09/2023 CLINICAL DATA:  Chest pain, left flank pain, low back pain, and elevated blood pressure. EXAM: PORTABLE CHEST 1 VIEW COMPARISON:  Chest radiograph 01/18/2022.  Left rib views 09/15/2022 FINDINGS: Heart size and pulmonary vascularity are normal for technique. Emphysematous changes and scattered fibrosis in the lungs. No airspace disease or consolidation. No pleural effusion or pneumothorax. Calcified and tortuous aorta. Degenerative changes in the spine. IMPRESSION: Cardiac enlargement. Emphysematous changes in the lungs. No focal consolidation. Electronically Signed   By: Burman Nieves M.D.   On: 03/09/2023 16:27    Procedures Procedures    Medications Ordered in ED Medications  iohexol (OMNIPAQUE) 300 MG/ML solution 100 mL (100 mLs Intravenous Contrast Given 03/09/23 1454)  hydrALAZINE (APRESOLINE) injection 5 mg (5 mg Intravenous Given 03/09/23 1630)    ED Course/ Medical Decision Making/ A&P                                 Medical Decision Making 83 year old male with past medical history of coronary artery disease, hypertension, and hyperlipidemia presenting  to the emergency department today with chest discomfort and abdominal pain.  I will further evaluate patient here with basic labs Wels and EKG, chest x-ray, and troponin for further evaluation for ACS, pulmonary edema, pulmonary infiltrates, or pneumothorax.  I will also obtain LFTs and a lipase to evaluate for hepatobiliary otology or pancreatitis.  Will also obtain a CT scan of his abdomen to evaluate for bowel obstruction, appendicitis, diverticulitis, malignancy, or other intra-abdominal pathology.  I will reevaluate for ultimate disposition.  Patient's initial troponin is unremarkable.  Labs are reassuring.  CT scan is pending at the time of signout.    Amount and/or Complexity of Data Reviewed Labs: ordered. Radiology: ordered.  Risk Prescription drug management.          Final Clinical Impression(s) / ED Diagnoses Final diagnoses:  Elevated blood pressure reading  Rx / DC Orders ED Discharge Orders     None         Durwin Glaze, MD 03/09/23 [phone]

## 2023-03-10 ENCOUNTER — Other Ambulatory Visit: Payer: Self-pay | Admitting: Orthopedic Surgery

## 2023-03-10 DIAGNOSIS — M48061 Spinal stenosis, lumbar region without neurogenic claudication: Secondary | ICD-10-CM

## 2023-03-13 ENCOUNTER — Telehealth: Payer: Self-pay

## 2023-03-13 NOTE — Telephone Encounter (Signed)
 Pt's wife called wanting you to please send another tube of [facility] Cream for the pt's rectal pain. His last visit was 11/01/2022 and he was suppose to come back in 6 weeks but failed to do so. Please advise

## 2023-03-13 NOTE — ED Provider Notes (Signed)
 [facility]    CSN: [id number] Arrival date & time: 03/09/23  1113      History   Chief Complaint No chief complaint on file.   HPI Alex Balis. is a 83 y.o. male.   Patient presenting today with son for evaluation of left-sided chest pain radiating down toward left flank, diffuse lower back pain, constipation, several days of elevated blood pressure readings up to 180s over 90s.  Denies known fevers, dizziness, wheezing, shortness of breath, melena, vomiting.  Past medical history significant for history of DVT not currently on anticoagulation, history of MI, history of hypertension and hyperlipidemia, GERD, history of CVA, history of constipation, history of AAA, tobacco abuse.    Past Medical History:  Diagnosis Date   Anxiety and depression    Arteriosclerotic cardiovascular disease (ASCVD)    06/2008: DES to the RCA and LAD   Blood clotting disorder (HCC)    Carpal tunnel syndrome    Chronic anticoagulation    Deep vein thrombosis (HCC)    Degenerative joint disease    Gastroesophageal reflux disease    Hyperlipidemia    Hypertension    Myocardial infarction (HCC)    1970   Prostate cancer (HCC)    Stroke (HCC)    Tobacco abuse     Patient Active Problem List   Diagnosis Date Noted   Constipation 11/01/2022   Dysphagia 11/01/2022   Prolapsed internal hemorrhoids, grade 3 05/26/2022   Grade II hemorrhoids 10/21/2021   Hemorrhoids 09/30/2021   Bradycardia 08/16/2021   Hypotension 08/16/2021   Aortic aneurysm (HCC) 08/16/2021   AKI (acute kidney injury) (HCC) 08/16/2021   Rectal pain 08/10/2021   Erectile dysfunction due to arterial insufficiency 09/13/2019   Weak urinary stream 09/13/2019   Benign prostatic hyperplasia with urinary obstruction    Depression with anxiety    Stroke-like symptoms 01/30/2019   Stroke (HCC) 01/30/2019   Malignant neoplasm of prostate (HCC) 04/22/2018   Chest tightness 05/04/2016   Dyspnea on exertion  05/04/2016   Coronary artery disease involving native coronary artery of native heart without angina pectoris    Hyperlipidemia    Tobacco abuse    Deep vein thrombosis (HCC)    Gastroesophageal reflux disease    Hypertension    Osteoarthritis 02/14/2006    Past Surgical History:  Procedure Laterality Date   BACK SURGERY  1988   CARDIAC CATHETERIZATION  2004   stents   CATARACT EXTRACTION W/PHACO Right 04/04/2022   Procedure: CATARACT EXTRACTION PHACO AND INTRAOCULAR LENS PLACEMENT (IOC);  Surgeon: Fabio Pierce, MD;  Location: [facility];  Service: Ophthalmology;  Laterality: Right;  CDE: 10.63   COLONOSCOPY  ?  Date   COLONOSCOPY WITH PROPOFOL N/A 07/14/2021   Procedure: COLONOSCOPY WITH PROPOFOL;  Surgeon: Lanelle Bal, DO;  Location: [facility];  Service: Endoscopy;  Laterality: N/A;  8:15am, asa 3   IM NAILING HUMERUS Left    INGUINAL HERNIA REPAIR     ORIF-unknown fracture     POLYPECTOMY  07/14/2021   Procedure: POLYPECTOMY;  Surgeon: Lanelle Bal, DO;  Location: [facility];  Service: Endoscopy;;   PROSTATE BIOPSY     ROTATOR CUFF REPAIR         Home Medications    Prior to Admission medications   Medication Sig Start Date End Date Taking? Authorizing Provider  acetaminophen (TYLENOL) 500 MG tablet Take 500 mg by mouth every 6 (six) hours as needed.    [provider]  albuterol (VENTOLIN HFA) 108 (90 Base) MCG/ACT inhaler Inhale 2 puffs into the lungs every 4 (four) hours as needed for wheezing or shortness of breath. 05/30/21   [provider]  amoxicillin-clavulanate (AUGMENTIN) 875-125 MG tablet Take 1 tablet by mouth 2 (two) times daily for 7 days. 03/09/23 03/16/23  Coral Spikes, DO  aspirin EC 325 MG tablet Take 325 mg by mouth daily.    [provider]  atorvastatin (LIPITOR) 40 MG tablet Take 1 tablet (40 mg total) by mouth daily at 6 PM. Patient taking differently: Take 40 mg by mouth at bedtime. 01/31/19   Vassie Loll,  MD  cholecalciferol (VITAMIN D3) 25 MCG (1000 UNIT) tablet Take 1,000 Units by mouth daily.    [provider]  clonazePAM (KLONOPIN) 0.5 MG tablet Take 0.5 mg by mouth 3 (three) times daily as needed for anxiety. 05/19/21   [provider]  gabapentin (NEURONTIN) 100 MG capsule Take 1 capsule (100 mg total) by mouth at bedtime. 03/13/23   Vickki Hearing, MD  hydrocortisone cream 1 % Apply 1 Application topically 2 (two) times daily. Preparation H, sometimes uses three times per day    [provider]  Ibuprofen (ADVIL) 200 MG CAPS Take 1 capsule by mouth daily as needed.    [provider]  lidocaine 4 % Place 1 patch onto the skin daily. 03/09/23   Coral Spikes, DO  losartan (COZAAR) 50 MG tablet Take 50 mg by mouth daily. 04/20/22   [provider]  melatonin 5 MG TABS Take 20 mg by mouth at bedtime.    [provider]  pantoprazole (PROTONIX) 40 MG tablet Take 1 tablet (40 mg total) by mouth daily. 04/20/22 04/20/23  Lanelle Bal, DO  sertraline (ZOLOFT) 100 MG tablet SMARTSIG:1.0 Tablet(s) By Mouth Daily 12/08/22   [provider]  traMADol (ULTRAM) 50 MG tablet Take 50 mg by mouth 3 (three) times daily as needed. 02/11/23   [provider]    Family History Family History  Problem Relation Age of Onset   Coronary artery disease Father    Coronary artery disease Mother        died post-CABG   Heart disease Mother    Breast cancer Maternal Aunt    Lung cancer Cousin    Melanoma Daughter    Prostate cancer Neg Hx     Social History Social History   Tobacco Use   Smoking status: Every Day    Current packs/day: 1.50    Average packs/day: 1.5 packs/day for 55.0 years (82.5 ttl pk-yrs)    Types: Cigarettes    Passive exposure: Current   Smokeless tobacco: Never  Vaping Use   Vaping status: Never Used  Substance Use Topics   Alcohol use: Not Currently   Drug use: No     Allergies    Codeine   Review of Systems Review of Systems PER HPI  Physical Exam Triage Vital Signs ED Triage Vitals  Encounter Vitals Group     BP 03/09/23 1212 (!) 174/82     Systolic BP Percentile --      Diastolic BP Percentile --      Pulse Rate 03/09/23 1212 (!) 56     Resp 03/09/23 1212 20     Temp 03/09/23 1212 98.2 F (36.8 C)     Temp Source 03/09/23 1212 Oral     SpO2 03/09/23 1212 92 %     Weight --  Height --      Head Circumference --      Peak Flow --      Pain Score 03/09/23 1217 7     Pain Loc --      Pain Education --      Exclude from Growth Chart --    No data found.  Updated Vital Signs BP (!) 174/82 (BP Location: Right Arm)   Pulse (!) 56   Temp 98.2 F (36.8 C) (Oral)   Resp 20   SpO2 92%   Visual Acuity Right Eye Distance:   Left Eye Distance:   Bilateral Distance:    Right Eye Near:   Left Eye Near:    Bilateral Near:     Physical Exam Vitals and nursing note reviewed.  Constitutional:      Appearance: He is well-developed.  HENT:     Head: Atraumatic.     Right Ear: External ear normal.     Left Ear: External ear normal.     Nose: Nose normal.     Mouth/Throat:     Pharynx: No oropharyngeal exudate.  Eyes:     Conjunctiva/sclera: Conjunctivae normal.     Pupils: Pupils are equal, round, and reactive to light.  Cardiovascular:     Rate and Rhythm: Bradycardia present.  Pulmonary:     Effort: Pulmonary effort is normal.     Breath sounds: No wheezing.  Abdominal:     General: Bowel sounds are normal. There is no distension.     Palpations: Abdomen is soft.     Tenderness: There is no abdominal tenderness. There is no right CVA tenderness, left CVA tenderness or guarding.  Musculoskeletal:        General: Normal range of motion.     Cervical back: Normal range of motion and neck supple.  Lymphadenopathy:     Cervical: No cervical adenopathy.  Skin:    General: Skin is warm and dry.  Neurological:     Mental Status: He is  alert. Mental status is at baseline.  Psychiatric:        Thought Content: Thought content normal.        Judgment: Judgment normal.    UC Treatments / Results  Labs (all labs ordered are listed, but only abnormal results are displayed) Labs Reviewed  POCT URINALYSIS DIP (MANUAL ENTRY) - Abnormal; Notable for the following components:      Result Value   Protein Ur, POC =30 (*)    All other components within normal limits    EKG   Radiology No results found.  Procedures Procedures (including critical care time)  Medications Ordered in UC Medications - No data to display  Initial Impression / Assessment and Plan / UC Course  I have reviewed the triage vital signs and the nursing notes.  Pertinent labs & imaging results that were available during my care of the patient were reviewed by me and considered in my medical decision making (see chart for details).     Hypertensive and bradycardic in triage, oxygen saturation 92% on room air.  Given his very nonspecific and worsening symptoms that, consistently elevated blood pressure readings, concerning chronic medical problems recommend emergency department for further evaluation.  He is agreeable to this plan and his son who is with him today plans to drive him via private vehicle.  They declined EMS transport today. Final Clinical Impressions(s) / UC Diagnoses   Final diagnoses:  Elevated blood pressure reading  Chest pain, unspecified  type  Other fatigue  SOB (shortness of breath)  Flank pain  Constipation, unspecified constipation type   Discharge Instructions   None    ED Prescriptions   None    PDMP not reviewed this encounter.   Particia Nearing, [place] 03/13/23 1906

## 2023-03-16 ENCOUNTER — Encounter (HOSPITAL_COMMUNITY): Payer: Self-pay | Admitting: Internal Medicine

## 2023-03-16 ENCOUNTER — Other Ambulatory Visit (HOSPITAL_COMMUNITY): Payer: Self-pay | Admitting: Internal Medicine

## 2023-03-16 DIAGNOSIS — J189 Pneumonia, unspecified organism: Secondary | ICD-10-CM

## 2023-03-17 ENCOUNTER — Ambulatory Visit (HOSPITAL_COMMUNITY)
Admission: RE | Admit: 2023-03-17 | Discharge: 2023-03-17 | Disposition: A | Discharge status: Home / Self Care | Source: Ambulatory Visit | Attending: Internal Medicine | Admitting: Internal Medicine

## 2023-03-17 DIAGNOSIS — J984 Other disorders of lung: Secondary | ICD-10-CM | POA: Diagnosis not present

## 2023-03-17 DIAGNOSIS — R918 Other nonspecific abnormal finding of lung field: Secondary | ICD-10-CM | POA: Diagnosis not present

## 2023-03-17 DIAGNOSIS — J189 Pneumonia, unspecified organism: Secondary | ICD-10-CM | POA: Diagnosis not present

## 2023-03-17 DIAGNOSIS — R0602 Shortness of breath: Secondary | ICD-10-CM | POA: Diagnosis not present

## 2023-03-21 ENCOUNTER — Telehealth: Payer: Self-pay | Admitting: Orthopedic Surgery

## 2023-03-21 NOTE — Telephone Encounter (Signed)
 Dr. Mort Sawyers pt - returned the pt's called from 03/20/23, lvm for him to call me back

## 2023-03-28 ENCOUNTER — Ambulatory Visit: Admitting: Physical Medicine and Rehabilitation

## 2023-03-31 ENCOUNTER — Ambulatory Visit: Admitting: Orthopedic Surgery

## 2023-03-31 DIAGNOSIS — M75111 Incomplete rotator cuff tear or rupture of right shoulder, not specified as traumatic: Secondary | ICD-10-CM | POA: Diagnosis not present

## 2023-03-31 DIAGNOSIS — M75122 Complete rotator cuff tear or rupture of left shoulder, not specified as traumatic: Secondary | ICD-10-CM

## 2023-03-31 MED ORDER — METHYLPREDNISOLONE ACETATE 40 MG/ML IJ SUSP
40.0000 mg | Freq: Once | INTRAMUSCULAR | Status: AC
Start: 2023-03-31 — End: ?

## 2023-03-31 MED ORDER — METHYLPREDNISOLONE ACETATE 40 MG/ML IJ SUSP
40.0000 mg | Freq: Once | INTRAMUSCULAR | Status: AC
  Administered 2023-03-31: 40 mg via INTRA_ARTICULAR

## 2023-03-31 MED ORDER — METHYLPREDNISOLONE ACETATE 40 MG/ML IJ SUSP: 40.0000 mg | Freq: Once | INTRAMUSCULAR | Status: AC

## 2023-03-31 NOTE — Addendum Note (Signed)
 Addended by: Fuller Canada E on: 03/31/2023 11:13 AM   Modules accepted: Level of Service

## 2023-03-31 NOTE — Progress Notes (Signed)
 Chief Complaint  Patient presents with   Injections    Both shoulders     Procedure injection right shoulder subacromial joint and left shoulder subacromial joint   procedure note the subacromial injection shoulder RIGHT  Verbal consent was obtained to inject the  RIGHT   Shoulder  Timeout was completed to confirm the injection site is a subacromial space of the  RIGHT  shoulder   Medication used Depo-Medrol 40 mg and lidocaine 1% 3 cc  Anesthesia was provided by ethyl chloride  The injection was performed in the RIGHT  posterior subacromial space. After pinning the skin with alcohol and anesthetized the skin with ethyl chloride the subacromial space was injected using a 20-gauge needle. There were no complications  Sterile dressing was applied.  Encounter Diagnoses  Name Primary?   Nontraumatic incomplete tear of right rotator cuff Yes   Nontraumatic complete tear of left rotator cuff      Procedure note the subacromial injection shoulder left   Verbal consent was obtained to inject the  Left   Shoulder  Timeout was completed to confirm the injection site is a subacromial space of the  left  shoulder  Medication used Depo-Medrol 40 mg and lidocaine 1% 3 cc  Anesthesia was provided by ethyl chloride  The injection was performed in the left  posterior subacromial space. After pinning the skin with alcohol and anesthetized the skin with ethyl chloride the subacromial space was injected using a 20-gauge needle. There were no complications  Sterile dressing was applied.

## 2023-04-13 ENCOUNTER — Other Ambulatory Visit: Payer: Self-pay | Admitting: Internal Medicine

## 2023-04-13 DIAGNOSIS — I5022 Chronic systolic (congestive) heart failure: Secondary | ICD-10-CM | POA: Diagnosis not present

## 2023-04-13 DIAGNOSIS — J440 Chronic obstructive pulmonary disease with acute lower respiratory infection: Secondary | ICD-10-CM | POA: Diagnosis not present

## 2023-04-13 DIAGNOSIS — M1990 Unspecified osteoarthritis, unspecified site: Secondary | ICD-10-CM | POA: Diagnosis not present

## 2023-05-04 ENCOUNTER — Encounter: Payer: Self-pay | Admitting: Orthopedic Surgery

## 2023-05-04 ENCOUNTER — Ambulatory Visit: Admitting: Orthopedic Surgery

## 2023-05-04 VITALS — BP 167/83 | HR 64 | Ht 73.0 in | Wt 181.0 lb

## 2023-05-04 DIAGNOSIS — M48061 Spinal stenosis, lumbar region without neurogenic claudication: Secondary | ICD-10-CM

## 2023-05-04 DIAGNOSIS — G8929 Other chronic pain: Secondary | ICD-10-CM | POA: Diagnosis not present

## 2023-05-04 DIAGNOSIS — M25562 Pain in left knee: Secondary | ICD-10-CM

## 2023-05-04 NOTE — Progress Notes (Signed)
 Office visit  Chief Complaint  Patient presents with   Back Pain    Left side upper lumbar area / ribs after fell 2 days ago     Alex Lara is thoroughly confused and he has difficulty with speech so it is hard to get a good history  He has several complaints some of them are chronic some of them are new  He fell the other day he has some pain on his left side between his lower rib cage and his iliac crest mainly in the lower back area  Denies any hematuria  He complains of bilateral leg and knee pain  He has chronic degenerative disc disease lumbar and cervical   His knee and leg exam reveal tenderness over the medial femoral condyles no effusions in either knee Passive range of motion full extension with 125 degrees of flexion Because he walks in a flexed gait all the time when you stretch his knees out to full extension he does complain of some pain but that is from pulling on the hamstrings  No acute or serious process at this time  Recommend physical therapy for spinal stenosis  Chronic knee pain with osteoarthritis  Cervical disc disease chronic no treatment needed

## 2023-05-04 NOTE — Progress Notes (Signed)
  Intake history:  BP (!) 167/83   Pulse 64   Ht 6\' 1"  (1.854 m)   Wt 181 lb (82.1 kg)   BMI 23.88 kg/m  Body mass index is 23.88 kg/m.    WHAT ARE WE SEEING YOU FOR TODAY?  Left lower/ ribs after fell 2 days ago 2.patient states he also has a sore on toe, he will see primary care about this.  3.Wants referral for physical therapy   How long has this bothered you? (DOI?DOS?WS?)    Anticoag.  No  Diabetes No  Heart disease has peripheral vascular disease and AFIB  Hypertension Yes  SMOKING HX Yes  Kidney disease     Latest Ref Rng & Units 03/09/2023    1:32 PM 08/18/2021   12:50 AM 08/17/2021    1:32 AM  CMP  Glucose 70 - 99 mg/dL 401  89  97   BUN 8 - 23 mg/dL 28  27  27    Creatinine 0.61 - 1.24 mg/dL 0.27  2.53  6.64   Sodium 135 - 145 mmol/L 135  135  134   Potassium 3.5 - 5.1 mmol/L 4.2  4.1  3.7   Chloride 98 - 111 mmol/L 103  112  108   CO2 22 - 32 mmol/L 22  18  20    Calcium  8.9 - 10.3 mg/dL 9.1  8.8  8.6   Total Protein 6.5 - 8.1 g/dL 7.1     Total Bilirubin 0.0 - 1.2 mg/dL 0.6     Alkaline Phos 38 - 126 U/L 53     AST 15 - 41 U/L 23     ALT 0 - 44 U/L 18        Any ALLERGIES ________ Allergies  Allergen Reactions   Codeine Nausea And Vomiting and Other (See Comments)    "go fuzzy"   ______________________________________   Treatment:  Have you taken:  Tylenol  Yes  Advil No  Had PT No  Had injection No  Other  _________________________

## 2023-05-08 DIAGNOSIS — M1990 Unspecified osteoarthritis, unspecified site: Secondary | ICD-10-CM | POA: Diagnosis not present

## 2023-05-08 DIAGNOSIS — F419 Anxiety disorder, unspecified: Secondary | ICD-10-CM | POA: Diagnosis not present

## 2023-05-08 DIAGNOSIS — Z79899 Other long term (current) drug therapy: Secondary | ICD-10-CM | POA: Diagnosis not present

## 2023-05-08 DIAGNOSIS — F329 Major depressive disorder, single episode, unspecified: Secondary | ICD-10-CM | POA: Diagnosis not present

## 2023-05-08 DIAGNOSIS — I6300 Cerebral infarction due to thrombosis of unspecified precerebral artery: Secondary | ICD-10-CM | POA: Diagnosis not present

## 2023-05-18 DIAGNOSIS — I5020 Unspecified systolic (congestive) heart failure: Secondary | ICD-10-CM | POA: Diagnosis not present

## 2023-05-18 DIAGNOSIS — R7989 Other specified abnormal findings of blood chemistry: Secondary | ICD-10-CM | POA: Diagnosis not present

## 2023-05-18 DIAGNOSIS — Z8673 Personal history of transient ischemic attack (TIA), and cerebral infarction without residual deficits: Secondary | ICD-10-CM | POA: Diagnosis not present

## 2023-06-01 ENCOUNTER — Ambulatory Visit: Admitting: Gastroenterology

## 2023-06-07 ENCOUNTER — Ambulatory Visit (HOSPITAL_COMMUNITY)

## 2023-06-08 ENCOUNTER — Ambulatory Visit (INDEPENDENT_AMBULATORY_CARE_PROVIDER_SITE_OTHER): Admitting: Gastroenterology

## 2023-06-08 VITALS — BP 156/80 | HR 59 | Temp 98.6°F | Ht 72.0 in | Wt 182.2 lb

## 2023-06-08 DIAGNOSIS — K6289 Other specified diseases of anus and rectum: Secondary | ICD-10-CM

## 2023-06-08 DIAGNOSIS — K5900 Constipation, unspecified: Secondary | ICD-10-CM

## 2023-06-08 DIAGNOSIS — K5909 Other constipation: Secondary | ICD-10-CM | POA: Diagnosis not present

## 2023-06-08 MED ORDER — LUBIPROSTONE 8 MCG PO CAPS: 8.0000 ug | ORAL_CAPSULE | Freq: Two times a day (BID) | ORAL | 3 refills | Status: DC

## 2023-06-08 NOTE — Patient Instructions (Addendum)
 I have sent in Amitiza to take twice a day with food to avoid nausea. This is to help with constipation.  I recommend using the over-the-counter cream Tronolane as needed for burning and itching at rectum. This can help soothe your skin.  We will see you in 6 weeks!  I enjoyed seeing you again today! I value our relationship and want to provide genuine, compassionate, and quality care. You may receive a survey regarding your visit with me, and I welcome your feedback! Thanks so much for taking the time to complete this. I look forward to seeing you again.      Delman Ferns, PhD, ANP-BC [facility] Gastroenterology

## 2023-06-08 NOTE — Progress Notes (Signed)
 Gastroenterology Office Note     Primary Care Physician:  Artemisa Bile, MD  Primary Gastroenterologist: Dr. Mordechai April   Chief Complaint   Chief Complaint  Patient presents with   Follow-up    Follow up hemorrhoids, rectal pain from straining which causes bleeding. Pt complains of rectal pain. Pt also states he needs more rectal cream     History of Present Illness   Alex Lara. is an 83 y.o. male presenting today with a history of constipation, symptomatic hemorrhoids, banding X 3 in 2023. Last colonoscopy July 2023: non-bleeding internal hemorrhoids, sigmoid diverticulosis, one 5 mm polyp s/p removal. Sessile serrated adenoma. 5 year surveillance    At last visit, I sent in compounded [facility] hemorrhoid cream with diltiazem and lidocaine  due to concern for fissure.   Has to strain. States will bust open if hard stool. Uses vaseline per rectum or yellow salve (antiseptic). About 2-3 BMs a day. First one is a plug. In past has been on stool softener but is now out. Has been on miralax  in the past. Enemas. Trulance was too strong. Believes he has another hemorrhoid that needs to be fixed and feels this is his problem.   Difficult historian.     Past Medical History:  Diagnosis Date   Anxiety and depression    Arteriosclerotic cardiovascular disease (ASCVD)    06/2008: DES to the RCA and LAD   Blood clotting disorder (HCC)    Carpal tunnel syndrome    Chronic anticoagulation    Deep vein thrombosis (HCC)    Degenerative joint disease    Gastroesophageal reflux disease    Hyperlipidemia    Hypertension    Myocardial infarction (HCC)    1970   Prostate cancer (HCC)    Stroke (HCC)    Tobacco abuse     Past Surgical History:  Procedure Laterality Date   BACK SURGERY  1988   CARDIAC CATHETERIZATION  2004   stents   CATARACT EXTRACTION W/PHACO Right 04/04/2022   Procedure: CATARACT EXTRACTION PHACO AND INTRAOCULAR LENS PLACEMENT (IOC);  Surgeon:  Tarri Farm, MD;  Location: [facility];  Service: Ophthalmology;  Laterality: Right;  CDE: 10.63   COLONOSCOPY  ?  Date   COLONOSCOPY WITH PROPOFOL  N/A 07/14/2021   Procedure: COLONOSCOPY WITH PROPOFOL ;  Surgeon: Vinetta Greening, DO;  Location: [facility];  Service: Endoscopy;  Laterality: N/A;  8:15am, asa 3   IM NAILING HUMERUS Left    INGUINAL HERNIA REPAIR     ORIF-unknown fracture     POLYPECTOMY  07/14/2021   Procedure: POLYPECTOMY;  Surgeon: Vinetta Greening, DO;  Location: [facility];  Service: Endoscopy;;   PROSTATE BIOPSY     ROTATOR CUFF REPAIR      Current Outpatient Medications  Medication Sig Dispense Refill   acetaminophen  (TYLENOL ) 500 MG tablet Take 500 mg by mouth every 6 (six) hours as needed.     albuterol  (VENTOLIN  HFA) 108 (90 Base) MCG/ACT inhaler Inhale 2 puffs into the lungs every 4 (four) hours as needed for wheezing or shortness of breath.     aspirin  EC 325 MG tablet Take 325 mg by mouth daily.     atorvastatin  (LIPITOR) 40 MG tablet Take 1 tablet (40 mg total) by mouth daily at 6 PM. (Patient taking differently: Take 40 mg by mouth at bedtime.) 30 tablet 3   cholecalciferol (VITAMIN D3) 25 MCG (1000 UNIT) tablet Take 1,000 Units by mouth daily.  clonazePAM  (KLONOPIN ) 0.5 MG tablet Take 0.5 mg by mouth 3 (three) times daily as needed for anxiety.     furosemide (LASIX) 20 MG tablet Take 20 mg by mouth daily.     gabapentin  (NEURONTIN ) 100 MG capsule Take 1 capsule (100 mg total) by mouth at bedtime. 60 capsule 5   hydrocortisone  cream 1 % Apply 1 Application topically 2 (two) times daily. Preparation H, sometimes uses three times per day     Ibuprofen (ADVIL) 200 MG CAPS Take 1 capsule by mouth daily as needed.     lidocaine  4 % Place 1 patch onto the skin daily. 15 patch 0   losartan (COZAAR) 50 MG tablet Take 50 mg by mouth daily.     melatonin 5 MG TABS Take 20 mg by mouth at bedtime.     pantoprazole  (PROTONIX ) 40 MG tablet TAKE 1 TABLET(40 MG)  BY MOUTH DAILY 30 tablet 11   sertraline  (ZOLOFT ) 100 MG tablet SMARTSIG:1.0 Tablet(s) By Mouth Daily     traMADol  (ULTRAM ) 50 MG tablet Take 50 mg by mouth 3 (three) times daily as needed.     Current Facility-Administered Medications  Medication Dose Route Frequency Provider Last Rate Last Admin   methylPREDNISolone  acetate (DEPO-MEDROL ) injection 40 mg  40 mg Intra-articular Once Darrin Emerald, MD       methylPREDNISolone  acetate (DEPO-MEDROL ) injection 40 mg  40 mg Intra-articular Once Darrin Emerald, MD        Allergies as of 06/08/2023 - Review Complete 06/08/2023  Allergen Reaction Noted   Codeine Nausea And Vomiting and Other (See Comments) 11/05/2013    Family History  Problem Relation Age of Onset   Coronary artery disease Father    Coronary artery disease Mother        died post-CABG   Heart disease Mother    Breast cancer Maternal Aunt    Lung cancer Cousin    Melanoma Daughter    Prostate cancer Neg Hx     Social History   Socioeconomic History   Marital status: Married    Spouse name: Not on file   Number of children: 3   Years of education: 12   Highest education level: Not on file  Occupational History    Comment: retired  Tobacco Use   Smoking status: Every Day    Current packs/day: 1.50    Average packs/day: 1.5 packs/day for 55.0 years (82.5 ttl pk-yrs)    Types: Cigarettes    Passive exposure: Current   Smokeless tobacco: Never  Vaping Use   Vaping status: Never Used  Substance and Sexual Activity   Alcohol use: Not Currently   Drug use: No   Sexual activity: Not Currently  Other Topics Concern   Not on file  Social History Narrative   Not on file   Social Drivers of Health   Financial Resource Strain: Not on file  Food Insecurity: Not on file  Transportation Needs: Not on file  Physical Activity: Not on file  Stress: Not on file  Social Connections: Not on file  Intimate Partner Violence: Not on file     Review of  Systems   Gen: Denies any fever, chills, fatigue, weight loss, lack of appetite.  CV: Denies chest pain, heart palpitations, peripheral edema, syncope.  Resp: Denies shortness of breath at rest or with exertion. Denies wheezing or cough.  GI: Denies dysphagia or odynophagia. Denies jaundice, hematemesis, fecal incontinence. GU : Denies urinary burning, urinary frequency, urinary hesitancy MS:  Denies joint pain, muscle weakness, cramps, or limitation of movement.  Derm: Denies rash, itching, dry skin Psych: Denies depression, anxiety, memory loss, and confusion Heme: Denies bruising, bleeding, and enlarged lymph nodes.   Physical Exam   BP (!) 156/80   Pulse (!) 59   Temp 98.6 F (37 C)   Ht 6' (1.829 m)   Wt 182 lb 3.2 oz (82.6 kg)   BMI 24.71 kg/m  General:   Alert and oriented. Pleasant and cooperative. Well-nourished and well-developed.  Head:  Normocephalic and atraumatic. Eyes:  Without icterus Rectal:  chronic external hemorrhoid tag, no mass on DRE, no obvious enlarged internal hemorrhoids with DRE Msk:  Symmetrical without gross deformities. Normal posture. Extremities:  Without edema. Neurologic:  Alert and  oriented x4;  grossly normal neurologically. Skin:  Intact without significant lesions or rashes. Psych:  Alert and cooperative. Normal mood and affect.   Assessment   Alex Lara. is an 83 y.o. male presenting today with a history of constipation, symptomatic hemorrhoids, banding X 3 in 2023. Last colonoscopy July 2023: non-bleeding internal hemorrhoids, sigmoid diverticulosis, one 5 mm polyp s/p removal. Sessile serrated adenoma. 5 year surveillance   Returns today with reports of rectal discomfort and significant pressure in setting of chronic constipation not ideally managed. Needs aggressive bowel regimen at this point, as he has failed multiple OTCs. Will start Amitiza  and increase if needed. Trulance caused watery stool.  In past, he has been treated  for anal fissure. Difficult historian. I don't feel banding would be helpful for him and no obvious fissure on exam. His reports are moreso difficulty with straining and suspect could have prolapsing hemorrhoids as well but will hold off on banding until constipation is well managed.     PLAN   Amitiza  8 mcg po BID with food Tronolane over the counter prn 6 week follow-up   Delman Ferns, PhD, [facility] Gastroenterology

## 2023-06-12 ENCOUNTER — Ambulatory Visit: Admitting: Orthopedic Surgery

## 2023-06-12 DIAGNOSIS — M1711 Unilateral primary osteoarthritis, right knee: Secondary | ICD-10-CM

## 2023-06-12 DIAGNOSIS — M1712 Unilateral primary osteoarthritis, left knee: Secondary | ICD-10-CM

## 2023-06-12 DIAGNOSIS — M170 Bilateral primary osteoarthritis of knee: Secondary | ICD-10-CM

## 2023-06-12 DIAGNOSIS — M1120 Other chondrocalcinosis, unspecified site: Secondary | ICD-10-CM

## 2023-06-12 MED ORDER — METHYLPREDNISOLONE ACETATE 40 MG/ML IJ SUSP
40.0000 mg | Freq: Once | INTRAMUSCULAR | Status: AC
  Administered 2023-06-12: 40 mg via INTRA_ARTICULAR

## 2023-06-12 NOTE — Addendum Note (Signed)
 Addended byArla Lab on: 06/12/2023 02:31 PM   Modules accepted: Orders

## 2023-06-12 NOTE — Progress Notes (Signed)
   There were no vitals taken for this visit.  There is no height or weight on file to calculate BMI.  No chief complaint on file.   Encounter Diagnoses  Name Primary?   Chondrocalcinosis Yes   Primary osteoarthritis of right knee    Primary osteoarthritis of left knee     DOI/DOS/ Date: ongoing  Worse- Bilateral knees

## 2023-06-12 NOTE — Progress Notes (Signed)
   Chief Complaint  Patient presents with   Knee Pain    Bilateral knees    Encounter Diagnoses  Name Primary?   Chondrocalcinosis Yes   Primary osteoarthritis of right knee    Primary osteoarthritis of left knee     DOI/DOS/ Date: ongoing  Worse- Bilateral knees  Alex Lara is complaining of bilateral knee pain.  He had a left femoral nailing years ago has had some knee pain in and around the nail for several years now  He has chondrocalcinosis in the left knee and mild arthritis  And his symptoms are consistent with arthritis in the right knee  He is not a surgical candidate and I recommend he get bilateral knee injections  Procedure note for bilateral knee injections  Procedure note left knee injection verbal consent was obtained to inject left knee joint  Timeout was completed to confirm the site of injection  The medications used were 40 mg depomedrol and 3 cc of 1% lidocaine   Anesthesia was provided by ethyl chloride and the skin was prepped with alcohol.  After cleaning the skin with alcohol a 20-gauge needle was used to inject the left knee joint. There were no complications. A sterile bandage was applied.   Procedure note right knee injection verbal consent was obtained to inject right knee joint  Timeout was completed to confirm the site of injection  The medications used were 40 mg depomedrol and 3 cc of 1% lidocaine   Anesthesia was provided by ethyl chloride and the skin was prepped with alcohol.  After cleaning the skin with alcohol a 20-gauge needle was used to inject the right knee joint. There were no complications. A sterile bandage was applied.

## 2023-06-13 DIAGNOSIS — G453 Amaurosis fugax: Secondary | ICD-10-CM | POA: Diagnosis not present

## 2023-06-13 DIAGNOSIS — Z961 Presence of intraocular lens: Secondary | ICD-10-CM | POA: Diagnosis not present

## 2023-06-13 DIAGNOSIS — H524 Presbyopia: Secondary | ICD-10-CM | POA: Diagnosis not present

## 2023-06-19 ENCOUNTER — Telehealth: Payer: Self-pay

## 2023-06-19 NOTE — Telephone Encounter (Signed)
 Pt was seen on 06/08/2023 and your note stated you would send in a cream for him but so far pharmacy doesn't have anything. Please advise

## 2023-06-20 NOTE — Telephone Encounter (Signed)
Phoned and LMOVM for the pt to return call 

## 2023-06-20 NOTE — Telephone Encounter (Signed)
 The cream is over the counter. It's not prescription. I also sent in Amitiza  to his pharmacy.  He can get Tronolane ointment over the counter.

## 2023-06-21 NOTE — Telephone Encounter (Signed)
 Phoned the pt and spoke with his wife, advised her of the Rx being sent in and anal cream being brought over the counter. Pt's wife expressed understanding

## 2023-06-29 DIAGNOSIS — I739 Peripheral vascular disease, unspecified: Secondary | ICD-10-CM | POA: Diagnosis not present

## 2023-06-29 DIAGNOSIS — F411 Generalized anxiety disorder: Secondary | ICD-10-CM | POA: Diagnosis not present

## 2023-06-30 ENCOUNTER — Other Ambulatory Visit (HOSPITAL_COMMUNITY): Payer: Self-pay | Admitting: Internal Medicine

## 2023-06-30 DIAGNOSIS — I739 Peripheral vascular disease, unspecified: Secondary | ICD-10-CM

## 2023-07-03 ENCOUNTER — Ambulatory Visit (INDEPENDENT_AMBULATORY_CARE_PROVIDER_SITE_OTHER): Admitting: Orthopedic Surgery

## 2023-07-03 DIAGNOSIS — M25511 Pain in right shoulder: Secondary | ICD-10-CM

## 2023-07-03 DIAGNOSIS — M25512 Pain in left shoulder: Secondary | ICD-10-CM

## 2023-07-03 DIAGNOSIS — G8929 Other chronic pain: Secondary | ICD-10-CM

## 2023-07-03 MED ORDER — METHYLPREDNISOLONE ACETATE 40 MG/ML IJ SUSP
40.0000 mg | Freq: Once | INTRAMUSCULAR | Status: AC
  Administered 2023-07-03: 40 mg via INTRA_ARTICULAR

## 2023-07-03 NOTE — Progress Notes (Signed)
 Chief Complaint  Patient presents with   Follow-up    Recheck on bilateral shoulders    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 After pinning the skin with alcohol and anesthetized the skin with ethyl chloride the subacromial space was injected using a 20-gauge needle. There were no complications  Sterile dressing was applied.    Procedure note the subacromial injection shoulder left   Verbal consent was obtained to inject the  Left   Shoulder  Timeout was completed to confirm the injection site is a subacromial space of the  left  shoulder  Medication used Depo-Medrol  40 mg and lidocaine  1% 3 cc  Anesthesia was provided by ethyl chloride  The injection was performed in the left  posterior subacromial space. After pinning the skin with alcohol and anesthetized the skin with ethyl chloride the subacromial space was injected using a 20-gauge needle. There were no complications  Sterile dressing was applied.

## 2023-07-04 ENCOUNTER — Encounter: Payer: Self-pay | Admitting: Internal Medicine

## 2023-07-06 ENCOUNTER — Ambulatory Visit (HOSPITAL_COMMUNITY)

## 2023-07-06 NOTE — Therapy (Incomplete)
 OUTPATIENT PHYSICAL THERAPY LOWER EXTREMITY EVALUATION   Patient Name: Alex Lara. MRN: [id number] DOB:1940/12/02, 83 y.o., male Today's Date: 07/06/2023  END OF SESSION:   Past Medical History:  Diagnosis Date   Anxiety and depression    Arteriosclerotic cardiovascular disease (ASCVD)    06/2008: DES to the RCA and LAD   Blood clotting disorder (HCC)    Carpal tunnel syndrome    Chronic anticoagulation    Deep vein thrombosis (HCC)    Degenerative joint disease    Gastroesophageal reflux disease    Hyperlipidemia    Hypertension    Myocardial infarction (HCC)    1970   Prostate cancer (HCC)    Stroke (HCC)    Tobacco abuse    Past Surgical History:  Procedure Laterality Date   BACK SURGERY  1988   CARDIAC CATHETERIZATION  2004   stents   CATARACT EXTRACTION W/PHACO Right 04/04/2022   Procedure: CATARACT EXTRACTION PHACO AND INTRAOCULAR LENS PLACEMENT (IOC);  Surgeon: Harrie Agent, MD;  Location: [facility];  Service: Ophthalmology;  Laterality: Right;  CDE: 10.63   COLONOSCOPY  ?  Date   COLONOSCOPY WITH PROPOFOL  N/A 07/14/2021   Procedure: COLONOSCOPY WITH PROPOFOL ;  Surgeon: Cindie Carlin POUR, DO;  Location: [facility];  Service: Endoscopy;  Laterality: N/A;  8:15am, asa 3   IM NAILING HUMERUS Left    INGUINAL HERNIA REPAIR     ORIF-unknown fracture     POLYPECTOMY  07/14/2021   Procedure: POLYPECTOMY;  Surgeon: Cindie Carlin POUR, DO;  Location: [facility];  Service: Endoscopy;;   PROSTATE BIOPSY     ROTATOR CUFF REPAIR     Patient Active Problem List   Diagnosis Date Noted   Constipation 11/01/2022   Dysphagia 11/01/2022   Prolapsed internal hemorrhoids, grade 3 05/26/2022   Grade II hemorrhoids 10/21/2021   Hemorrhoids 09/30/2021   Bradycardia 08/16/2021   Hypotension 08/16/2021   Aortic aneurysm (HCC) 08/16/2021   AKI (acute kidney injury) (HCC) 08/16/2021   Rectal pain 08/10/2021   Erectile dysfunction due to arterial insufficiency 09/13/2019    Weak urinary stream 09/13/2019   Benign prostatic hyperplasia with urinary obstruction    Depression with anxiety    Stroke-like symptoms 01/30/2019   Stroke (HCC) 01/30/2019   Malignant neoplasm of prostate (HCC) 04/22/2018   Chest tightness 05/04/2016   Dyspnea on exertion 05/04/2016   Coronary artery disease involving native coronary artery of native heart without angina pectoris    Hyperlipidemia    Tobacco abuse    Deep vein thrombosis (HCC)    Gastroesophageal reflux disease    Hypertension    Osteoarthritis 02/14/2006    PCP: Sheryle Carwin, MD  REFERRING PROVIDER: Margrette Taft BRAVO, MD  REFERRING DIAG:  [phone] (ICD-10-CM) - Chronic pain of left knee  M48.061 (ICD-10-CM) - Degenerative lumbar spinal stenosis    THERAPY DIAG:  No diagnosis found.  Rationale for Evaluation and Treatment: Rehabilitation  ONSET DATE: ***  SUBJECTIVE:   SUBJECTIVE STATEMENT: ***  PERTINENT HISTORY: *** PAIN:  Are you having pain? {OPRCPAIN:27236}  PRECAUTIONS: {Therapy precautions:24002}  RED FLAGS: {PT Red Flags:29287}   WEIGHT BEARING RESTRICTIONS: {Yes ***/No:24003}  FALLS:  Has patient fallen in last 6 months? {fallsyesno:27318}  PATIENT GOALS: ***  NEXT MD VISIT: ***  OBJECTIVE:  Note: Objective measures were completed at Evaluation unless otherwise noted.  DIAGNOSTIC FINDINGS: ***  PATIENT SURVEYS:  Modified Oswestry: ***  COGNITION: Overall cognitive status: Within functional limits for tasks assessed  SENSATION: {sensation:27233}  POSTURE: {posture:25561}  PALPATION: ***  LOWER EXTREMITY ROM:  Active ROM Right eval Left eval  Hip flexion    Hip extension    Hip abduction    Hip adduction    Hip internal rotation    Hip external rotation    Knee flexion    Knee extension    Ankle dorsiflexion    Ankle plantarflexion    Ankle inversion    Ankle eversion     (Blank rows = not tested)  LOWER EXTREMITY MMT:  MMT  Right eval Left eval  Hip flexion    Hip extension    Hip abduction    Hip adduction    Hip internal rotation    Hip external rotation    Knee flexion    Knee extension    Ankle dorsiflexion    Ankle plantarflexion    Ankle inversion    Ankle eversion     (Blank rows = not tested)   FUNCTIONAL TESTS:  {Functional tests:24029}  GAIT: Distance walked: *** Assistive device utilized: {Assistive devices:23999} Level of assistance: {Levels of assistance:24026} Comments: ***                                                                                                                                TREATMENT DATE:  07/06/2023 PT Evaluation    PATIENT EDUCATION:  Education details: PT Evaluation, findings, prognosis, frequency, attendance policy, and ***. Person educated: Patient Education method: Medical illustrator Education comprehension: verbalized understanding  HOME EXERCISE PROGRAM: ***  ASSESSMENT:  CLINICAL IMPRESSION: Patient is a *** y.o. *** who was seen today for physical therapy evaluation and treatment for  M25.562,G89.29 (ICD-10-CM) - Chronic pain of left knee  M48.061 (ICD-10-CM) - Degenerative lumbar spinal stenosis    ***. Pt will benefit from skilled Physical Therapy services to address deficits/limitations in order to improve functional and QOL.    OBJECTIVE IMPAIRMENTS: {opptimpairments:25111}.   ACTIVITY LIMITATIONS: {activitylimitations:27494}  PARTICIPATION LIMITATIONS: {participationrestrictions:25113}  PERSONAL FACTORS: {Personal factors:25162} are also affecting patient's functional outcome.   REHAB POTENTIAL: {rehabpotential:25112}  CLINICAL DECISION MAKING: {clinical decision making:25114}  EVALUATION COMPLEXITY: {Evaluation complexity:25115}   GOALS: Goals reviewed with patient? {yes/no:20286}  SHORT TERM GOALS: Target date: ***  Pt will be independent with HEP in order to demonstrate participation in Physical  Therapy POC.  Baseline: Goal status: INITIAL  2.  Pt will ***/10 pain during mobility in order to demonstrate improved pain while performing ADLs.  Baseline:  Goal status: INITIAL  LONG TERM GOALS: Target date: ***  Pt will improve 30 Second Chair Stand Test by *** in order to demonstrate improved functional strength to return to desired activities.  Baseline: see objective.  Goal status: INITIAL  2.  Pt will improve SLS by *** in order to demonstrate improved functional ambulatory capacity in community setting.  Baseline: see objective.  Goal status: INITIAL  3.  Pt will improve LEFS score by ***  in order to demonstrate improved pain with functional goals and outcomes. Baseline: see objective.  Goal status: INITIAL  4.  Pt will improve *** by *** in order to improve *** during functional activities.. Baseline: see objective.  Goal status: INITIAL  5.  Pt will improve *** by *** in order to improve *** during functional activities.. Baseline: see objective.  Goal status: INITIAL   PLAN:  PT FREQUENCY: {rehab frequency:25116}  PT DURATION: {rehab duration:25117}  PLANNED INTERVENTIONS: {rehab planned interventions:25118::97110-Therapeutic exercises,97530- Therapeutic [id number]- Neuromuscular re-education,97535- Self Rjmz,02859- Manual therapy}  PLAN FOR NEXT SESSION: ***   Omega JONETTA Bottcher, PT 07/06/2023, 8:05 AM

## 2023-07-07 ENCOUNTER — Ambulatory Visit (HOSPITAL_COMMUNITY)
Admission: RE | Admit: 2023-07-07 | Discharge: 2023-07-07 | Disposition: A | Discharge status: Home / Self Care | Source: Ambulatory Visit | Attending: Internal Medicine | Admitting: Internal Medicine

## 2023-07-07 DIAGNOSIS — I739 Peripheral vascular disease, unspecified: Secondary | ICD-10-CM | POA: Insufficient documentation

## 2023-07-07 DIAGNOSIS — M79605 Pain in left leg: Secondary | ICD-10-CM | POA: Diagnosis not present

## 2023-07-07 DIAGNOSIS — M79604 Pain in right leg: Secondary | ICD-10-CM | POA: Diagnosis not present

## 2023-07-10 ENCOUNTER — Telehealth: Payer: Self-pay | Admitting: Gastroenterology

## 2023-07-10 NOTE — Telephone Encounter (Signed)
 Patient called left voicemail to call patient back about his appt. No answer and left voicemail.

## 2023-07-13 ENCOUNTER — Ambulatory Visit: Admitting: Gastroenterology

## 2023-07-13 ENCOUNTER — Telehealth: Payer: Self-pay | Admitting: Gastroenterology

## 2023-07-13 NOTE — Telephone Encounter (Signed)
 Alex Lara cancelled but wife said he wanted to get word to you about anal burning and what he could do for it? Wife did not reschedule at this time. He had appt for today.

## 2023-08-03 ENCOUNTER — Emergency Department (HOSPITAL_COMMUNITY)
Admission: EM | Admit: 2023-08-03 | Discharge: 2023-08-03 | Disposition: A | Discharge status: Home / Self Care | Source: Other Acute Inpatient Hospital | Attending: Emergency Medicine | Admitting: Emergency Medicine

## 2023-08-03 ENCOUNTER — Other Ambulatory Visit: Payer: Self-pay

## 2023-08-03 ENCOUNTER — Encounter (HOSPITAL_COMMUNITY): Payer: Self-pay | Admitting: *Deleted

## 2023-08-03 ENCOUNTER — Emergency Department (HOSPITAL_COMMUNITY)

## 2023-08-03 DIAGNOSIS — K5790 Diverticulosis of intestine, part unspecified, without perforation or abscess without bleeding: Secondary | ICD-10-CM | POA: Diagnosis not present

## 2023-08-03 DIAGNOSIS — I714 Abdominal aortic aneurysm, without rupture, unspecified: Secondary | ICD-10-CM | POA: Diagnosis not present

## 2023-08-03 DIAGNOSIS — Z7982 Long term (current) use of aspirin: Secondary | ICD-10-CM | POA: Diagnosis not present

## 2023-08-03 DIAGNOSIS — I10 Essential (primary) hypertension: Secondary | ICD-10-CM | POA: Diagnosis not present

## 2023-08-03 DIAGNOSIS — Z79899 Other long term (current) drug therapy: Secondary | ICD-10-CM | POA: Insufficient documentation

## 2023-08-03 DIAGNOSIS — I7143 Infrarenal abdominal aortic aneurysm, without rupture: Secondary | ICD-10-CM | POA: Diagnosis not present

## 2023-08-03 DIAGNOSIS — I2510 Atherosclerotic heart disease of native coronary artery without angina pectoris: Secondary | ICD-10-CM | POA: Diagnosis not present

## 2023-08-03 DIAGNOSIS — K529 Noninfective gastroenteritis and colitis, unspecified: Secondary | ICD-10-CM | POA: Diagnosis not present

## 2023-08-03 DIAGNOSIS — R100 Acute abdomen: Secondary | ICD-10-CM | POA: Diagnosis not present

## 2023-08-03 DIAGNOSIS — R1032 Left lower quadrant pain: Secondary | ICD-10-CM

## 2023-08-03 DIAGNOSIS — R109 Unspecified abdominal pain: Secondary | ICD-10-CM | POA: Diagnosis not present

## 2023-08-03 DIAGNOSIS — N281 Cyst of kidney, acquired: Secondary | ICD-10-CM | POA: Diagnosis not present

## 2023-08-03 DIAGNOSIS — K5130 Ulcerative (chronic) rectosigmoiditis without complications: Secondary | ICD-10-CM | POA: Diagnosis not present

## 2023-08-03 LAB — CBC
HCT: 35.3 % — ABNORMAL LOW (ref 39.0–52.0)
Hemoglobin: 11.8 g/dL — ABNORMAL LOW (ref 13.0–17.0)
MCH: 32.2 pg (ref 26.0–34.0)
MCHC: 33.4 g/dL (ref 30.0–36.0)
MCV: 96.4 fL (ref 80.0–100.0)
Platelets: 147 K/uL — ABNORMAL LOW (ref 150–400)
RBC: 3.66 MIL/uL — ABNORMAL LOW (ref 4.22–5.81)
RDW: 15.4 % (ref 11.5–15.5)
WBC: 6.5 K/uL (ref 4.0–10.5)
nRBC: 0 % (ref 0.0–0.2)

## 2023-08-03 LAB — COMPREHENSIVE METABOLIC PANEL WITH GFR
ALT: 19 U/L (ref 0–44)
AST: 31 U/L (ref 15–41)
Albumin: 3.9 g/dL (ref 3.5–5.0)
Alkaline Phosphatase: 49 U/L (ref 38–126)
Anion gap: 10 (ref 5–15)
BUN: 34 mg/dL — ABNORMAL HIGH (ref 8–23)
CO2: 19 mmol/L — ABNORMAL LOW (ref 22–32)
Calcium: 8.9 mg/dL (ref 8.9–10.3)
Chloride: 108 mmol/L (ref 98–111)
Creatinine, Ser: 1.33 mg/dL — ABNORMAL HIGH (ref 0.61–1.24)
GFR, Estimated: 54 mL/min — ABNORMAL LOW (ref >60)
Glucose, Bld: 109 mg/dL — ABNORMAL HIGH (ref 70–99)
Potassium: 3.7 mmol/L (ref 3.5–5.1)
Sodium: 137 mmol/L (ref 135–145)
Total Bilirubin: 0.6 mg/dL (ref 0.0–1.2)
Total Protein: 7.1 g/dL (ref 6.5–8.1)

## 2023-08-03 LAB — URINALYSIS, ROUTINE W REFLEX MICROSCOPIC
Bacteria, UA: NONE SEEN
Bilirubin Urine: NEGATIVE
Glucose, UA: NEGATIVE mg/dL
Hgb urine dipstick: NEGATIVE
Ketones, ur: NEGATIVE mg/dL
Leukocytes,Ua: NEGATIVE
Nitrite: NEGATIVE
Protein, ur: 30 mg/dL — AB
Specific Gravity, Urine: 1.016 (ref 1.005–1.030)
pH: 5 (ref 5.0–8.0)

## 2023-08-03 LAB — LIPASE, BLOOD: Lipase: 36 U/L (ref 11–51)

## 2023-08-03 MED ORDER — IOHEXOL 300 MG/ML  SOLN
100.0000 mL | Freq: Once | INTRAMUSCULAR | Status: AC | PRN
  Administered 2023-08-03: 100 mL via INTRAVENOUS

## 2023-08-03 MED ORDER — DICYCLOMINE HCL 20 MG PO TABS
ORAL_TABLET | ORAL | 0 refills | Status: DC
Start: 2023-08-03 — End: 2023-10-05

## 2023-08-03 MED ORDER — SODIUM CHLORIDE 0.9 % IV BOLUS
1000.0000 mL | Freq: Once | INTRAVENOUS | Status: AC
  Administered 2023-08-03: 1000 mL via INTRAVENOUS

## 2023-08-03 MED ORDER — AMOXICILLIN-POT CLAVULANATE 875-125 MG PO TABS
1.0000 | ORAL_TABLET | Freq: Two times a day (BID) | ORAL | 0 refills | Status: DC
Start: 2023-08-03 — End: 2023-10-05

## 2023-08-03 NOTE — Discharge Instructions (Addendum)
 We are starting you on some antibiotics to help with possible infection or inflammation in your lower colon.  Please start with a clear liquid diet and advance as tolerated.  Follow-up with your family doctor next week for recheck.

## 2023-08-03 NOTE — ED Triage Notes (Signed)
 Pt sent from Dr. Marily office for abd pain since Monday. + diarrhea. Pt very HOH

## 2023-08-03 NOTE — ED Provider Notes (Signed)
 [place] EMERGENCY DEPARTMENT AT [facility] Provider Note   CSN: [id number] Arrival date & time: 08/03/23  1030     Patient presents with: Abdominal Pain   [name] H Tesla Bochicchio. is a 83 y.o. male.   Patient has a history of coronary artery disease and has a stent.  He also has hypertension stroke and GERD.  Patient comes in with abdominal pain since Monday with some loose stools.  No vomiting  The history is provided by the patient and medical records.  Abdominal Pain Pain location:  LLQ Pain quality: aching   Pain radiates to:  Does not radiate Pain severity:  Moderate Onset quality:  Sudden Timing:  Constant Progression:  Waxing and waning Chronicity:  New Context: not alcohol use   Relieved by:  Nothing Worsened by:  Nothing Associated symptoms: no chest pain, no cough, no diarrhea, no fatigue and no hematuria        Prior to Admission medications   Medication Sig Start Date End Date Taking? Authorizing Provider  acetaminophen  (TYLENOL ) 500 MG tablet Take 500 mg by mouth every 6 (six) hours as needed.    [provider]  albuterol  (VENTOLIN  HFA) 108 (90 Base) MCG/ACT inhaler Inhale 2 puffs into the lungs every 4 (four) hours as needed for wheezing or shortness of breath. 05/30/21   [provider]  aspirin  EC 325 MG tablet Take 325 mg by mouth daily.    [provider]  atorvastatin  (LIPITOR) 40 MG tablet Take 1 tablet (40 mg total) by mouth daily at 6 PM. Patient taking differently: Take 40 mg by mouth at bedtime. 01/31/19   Ricky Fines, MD  cholecalciferol (VITAMIN D3) 25 MCG (1000 UNIT) tablet Take 1,000 Units by mouth daily.    [provider]  clonazePAM  (KLONOPIN ) 0.5 MG tablet Take 0.5 mg by mouth 3 (three) times daily as needed for anxiety. 05/19/21   [provider]  furosemide (LASIX) 20 MG tablet Take 20 mg by mouth daily. 03/30/23   [provider]  gabapentin  (NEURONTIN ) 100 MG capsule Take 1  capsule (100 mg total) by mouth at bedtime. 03/13/23   Margrette Taft BRAVO, MD  hydrocortisone  cream 1 % Apply 1 Application topically 2 (two) times daily. Preparation H, sometimes uses three times per day    [provider]  Ibuprofen (ADVIL) 200 MG CAPS Take 1 capsule by mouth daily as needed.    [provider]  lidocaine  4 % Place 1 patch onto the skin daily. 03/09/23   Neysa Caron PARAS, DO  losartan (COZAAR) 50 MG tablet Take 50 mg by mouth daily. 04/20/22   [provider]  lubiprostone  (AMITIZA ) 8 MCG capsule Take 1 capsule (8 mcg total) by mouth 2 (two) times daily with a meal. 06/08/23   Shirlean Therisa ORN, NP  melatonin 5 MG TABS Take 20 mg by mouth at bedtime.    [provider]  pantoprazole  (PROTONIX ) 40 MG tablet TAKE 1 TABLET(40 MG) BY MOUTH DAILY 04/24/23   Shirlean Therisa ORN, NP  sertraline  (ZOLOFT ) 100 MG tablet SMARTSIG:1.0 Tablet(s) By Mouth Daily 12/08/22   [provider]  traMADol  (ULTRAM ) 50 MG tablet Take 50 mg by mouth 3 (three) times daily as needed. 02/11/23   [provider]    Allergies: Codeine    Review of Systems  Constitutional:  Negative for appetite change and fatigue.  HENT:  Negative for congestion, ear discharge and sinus pressure.   Eyes:  Negative for  discharge.  Respiratory:  Negative for cough.   Cardiovascular:  Negative for chest pain.  Gastrointestinal:  Positive for abdominal pain. Negative for diarrhea.  Genitourinary:  Negative for frequency and hematuria.  Musculoskeletal:  Negative for back pain.  Skin:  Negative for rash.  Neurological:  Negative for seizures and headaches.  Psychiatric/Behavioral:  Negative for hallucinations.     Updated Vital Signs BP (!) 140/78   Pulse (!) 54   Temp 98.3 F (36.8 C)   Resp 20   Ht 6' (1.829 m)   Wt 82.6 kg   SpO2 98%   BMI 24.70 kg/m   Physical Exam Vitals and nursing note reviewed.  Constitutional:      Appearance: He is well-developed.  HENT:      Head: Normocephalic.     Mouth/Throat:     Mouth: Mucous membranes are moist.  Eyes:     General: No scleral icterus.    Conjunctiva/sclera: Conjunctivae normal.  Neck:     Thyroid : No thyromegaly.  Cardiovascular:     Rate and Rhythm: Normal rate and regular rhythm.     Heart sounds: No murmur heard.    No friction rub. No gallop.  Pulmonary:     Breath sounds: No stridor. No wheezing or rales.  Chest:     Chest wall: No tenderness.  Abdominal:     General: There is no distension.     Tenderness: There is abdominal tenderness. There is no rebound.  Musculoskeletal:        General: Normal range of motion.     Cervical back: Neck supple.  Lymphadenopathy:     Cervical: No cervical adenopathy.  Skin:    Findings: No erythema or rash.  Neurological:     Mental Status: He is alert and oriented to person, place, and time.     Motor: No abnormal muscle tone.     Coordination: Coordination normal.  Psychiatric:        Behavior: Behavior normal.     (all labs ordered are listed, but only abnormal results are displayed) Labs Reviewed  COMPREHENSIVE METABOLIC PANEL WITH GFR - Abnormal; Notable for the following components:      Result Value   CO2 19 (*)    Glucose, Bld 109 (*)    BUN 34 (*)    Creatinine, Ser 1.33 (*)    GFR, Estimated 54 (*)    All other components within normal limits  CBC - Abnormal; Notable for the following components:   RBC 3.66 (*)    Hemoglobin 11.8 (*)    HCT 35.3 (*)    Platelets 147 (*)    All other components within normal limits  URINALYSIS, ROUTINE W REFLEX MICROSCOPIC - Abnormal; Notable for the following components:   Protein, ur 30 (*)    All other components within normal limits  LIPASE, BLOOD    EKG: None  Radiology: No results found.   Procedures   Medications Ordered in the ED  sodium chloride  0.9 % bolus 1,000 mL (1,000 mLs Intravenous New Bag/Given 08/03/23 1223)  iohexol  (OMNIPAQUE ) 300 MG/ML solution 100 mL (100 mLs  Intravenous Contrast Given 08/03/23 1451)                                    Medical Decision Making Amount and/or Complexity of Data Reviewed Labs: ordered. Radiology: ordered.  Risk Prescription drug management.   Proctitis and colitis.  Patient is sent home with antibiotics and will follow-up     Final diagnoses:  None    ED Discharge Orders     None          Suzette Pac, MD 08/06/23 1223

## 2023-08-03 NOTE — ED Notes (Signed)
 Pt/family received d/c paperwork at this time. After going over the paperwork any questions, comments, or concerns were answered to the best of this nurse's knowledge. The pt/family verbally acknowledged the teachings/instructions.   D/c paperwork reviewed with daughter

## 2023-08-03 NOTE — ED Notes (Signed)
 Updated pt's son over the phone at this time.

## 2023-08-03 NOTE — ED Provider Notes (Signed)
 Signout from Dr. Zammit.  83 year old male here with some abdominal pain and loose stools.  Pending CT abdomen and pelvis.  Disposition per results of CT. Physical Exam  BP (!) 164/76   Pulse (!) 55   Temp 98.3 F (36.8 C)   Resp 20   Ht 6' (1.829 m)   Wt 82.6 kg   SpO2 94%   BMI 24.70 kg/m   Physical Exam  Procedures  Procedures  ED Course / MDM    Medical Decision Making Amount and/or Complexity of Data Reviewed Labs: ordered. Radiology: ordered.  Risk Prescription drug management.   CT came back showing some thickening of rectum and distal colon consistent with proctocolitis.  Also shows incidental 3.7 cm abdominal aneurysm.  Will review with patient and cover with antibiotic.  Recommended outpatient follow-up with PCP.

## 2023-08-15 ENCOUNTER — Ambulatory Visit (HOSPITAL_COMMUNITY)

## 2023-08-15 NOTE — Therapy (Incomplete)
 OUTPATIENT PHYSICAL THERAPY LOWER EXTREMITY EVALUATION   Patient Name: Alex Lara. MRN: [id number] DOB:August 16, 1940, 83 y.o., male Today's Date: 08/15/2023  END OF SESSION:   Past Medical History:  Diagnosis Date   Anxiety and depression    Arteriosclerotic cardiovascular disease (ASCVD)    06/2008: DES to the RCA and LAD   Blood clotting disorder (HCC)    Carpal tunnel syndrome    Chronic anticoagulation    Deep vein thrombosis (HCC)    Degenerative joint disease    Gastroesophageal reflux disease    Hyperlipidemia    Hypertension    Myocardial infarction (HCC)    1970   Prostate cancer (HCC)    Stroke (HCC)    Tobacco abuse    Past Surgical History:  Procedure Laterality Date   BACK SURGERY  1988   CARDIAC CATHETERIZATION  2004   stents   CATARACT EXTRACTION W/PHACO Right 04/04/2022   Procedure: CATARACT EXTRACTION PHACO AND INTRAOCULAR LENS PLACEMENT (IOC);  Surgeon: Harrie Agent, MD;  Location: [facility];  Service: Ophthalmology;  Laterality: Right;  CDE: 10.63   COLONOSCOPY  ?  Date   COLONOSCOPY WITH PROPOFOL  N/A 07/14/2021   Procedure: COLONOSCOPY WITH PROPOFOL ;  Surgeon: Cindie Carlin POUR, DO;  Location: [facility];  Service: Endoscopy;  Laterality: N/A;  8:15am, asa 3   IM NAILING HUMERUS Left    INGUINAL HERNIA REPAIR     ORIF-unknown fracture     POLYPECTOMY  07/14/2021   Procedure: POLYPECTOMY;  Surgeon: Cindie Carlin POUR, DO;  Location: [facility];  Service: Endoscopy;;   PROSTATE BIOPSY     ROTATOR CUFF REPAIR     Patient Active Problem List   Diagnosis Date Noted   Constipation 11/01/2022   Dysphagia 11/01/2022   Prolapsed internal hemorrhoids, grade 3 05/26/2022   Grade II hemorrhoids 10/21/2021   Hemorrhoids 09/30/2021   Bradycardia 08/16/2021   Hypotension 08/16/2021   Aortic aneurysm (HCC) 08/16/2021   AKI (acute kidney injury) (HCC) 08/16/2021   Rectal pain 08/10/2021   Erectile dysfunction due to arterial insufficiency 09/13/2019    Weak urinary stream 09/13/2019   Benign prostatic hyperplasia with urinary obstruction    Depression with anxiety    Stroke-like symptoms 01/30/2019   Stroke (HCC) 01/30/2019   Malignant neoplasm of prostate (HCC) 04/22/2018   Chest tightness 05/04/2016   Dyspnea on exertion 05/04/2016   Coronary artery disease involving native coronary artery of native heart without angina pectoris    Hyperlipidemia    Tobacco abuse    Deep vein thrombosis (HCC)    Gastroesophageal reflux disease    Hypertension    Osteoarthritis 02/14/2006    PCP: Sheryle Carwin, MD   REFERRING PROVIDER: Margrette Taft BRAVO, MD  REFERRING DIAG: [phone] (ICD-10-CM) - Chronic pain of left knee M48.061 (ICD-10-CM) - Degenerative lumbar spinal stenosis  THERAPY DIAG:  No diagnosis found.  Rationale for Evaluation and Treatment: Rehabilitation  ONSET DATE: ***  SUBJECTIVE:   SUBJECTIVE STATEMENT: ***  PERTINENT HISTORY: *** PAIN:  Are you having pain? {OPRCPAIN:27236}  PRECAUTIONS: {Therapy precautions:24002}  RED FLAGS: {PT Red Flags:29287}   WEIGHT BEARING RESTRICTIONS: {Yes ***/No:24003}  FALLS:  Has patient fallen in last 6 months? {fallsyesno:27318}  LIVING ENVIRONMENT: Lives with: {OPRC lives with:25569::lives with their family} Lives in: {Lives in:25570} Stairs: {opstairs:27293} Has following equipment at home: {Assistive devices:23999}  OCCUPATION: ***  PLOF: {PLOF:24004}  PATIENT GOALS: ***  NEXT MD VISIT: ***  OBJECTIVE:  Note: Objective measures were completed at Evaluation  unless otherwise noted.  DIAGNOSTIC FINDINGS:  Left knee pain   Chronic left knee pain   Status post nailing of the left femur remote history of fracture   There is calcification of the popliteal vessel   There is chondrocalcinosis of the medial and lateral joints there is also squaring of the condyle as well medially.  The joint space however is maintained.  No effusion   Impression  chondrocalcinosis, osteoarthritis, previous femoral nailing no complications  Low back from 2023: CLINICAL DATA:  Back pain   EXAM: LUMBAR SPINE - COMPLETE 4+ VIEW   COMPARISON:  CT abdomen and pelvis 03/23/2018   FINDINGS: Mild levocurvature of the lumbar spine. Lumbar vertebral body height are preserved without evidence of fracture. Minimal grade 1 retrolisthesis of L2 on L3 and L4 on L5. No spondylolysis visualized. Mild to moderate intervertebral disc space narrowing at L1-L2 and L4-L5. Facet arthropathy.   Calcified plaques in the abdominal aorta which measures up to 4 cm in diameter, appears increased since previous CT.   IMPRESSION: 1. Degenerative changes of the lumbar spine as described. 2. Distal abdominal aortic aneurysm appears to measure 4 cm in diameter which would be increased since previous CT. Follow-up ultrasound or cross-sectional imaging recommended.  PATIENT SURVEYS:  Modified Oswestry:   LEFS:   COGNITION: Overall cognitive status: {cognition:24006}     SENSATION: {sensation:27233}  EDEMA:  {edema:24020}  MUSCLE LENGTH: Hamstrings: Right *** deg; Left *** deg Debby test: Right *** deg; Left *** deg  POSTURE: {posture:25561}  PALPATION: ***  LOWER EXTREMITY ROM:  {AROM/PROM:27142} ROM Right eval Left eval  Hip flexion    Hip extension    Hip abduction    Hip adduction    Hip internal rotation    Hip external rotation    Knee flexion    Knee extension    Ankle dorsiflexion    Ankle plantarflexion    Ankle inversion    Ankle eversion     (Blank rows = not tested)  LOWER EXTREMITY MMT:  MMT Right eval Left eval  Hip flexion    Hip extension    Hip abduction    Hip adduction    Hip internal rotation    Hip external rotation    Knee flexion    Knee extension    Ankle dorsiflexion    Ankle plantarflexion    Ankle inversion    Ankle eversion     (Blank rows = not tested)  LOWER EXTREMITY SPECIAL TESTS:   {LEspecialtests:26242}  FUNCTIONAL TESTS:  5 times sit to stand: *** 2 minute walk test: ***  GAIT: Distance walked: *** Assistive device utilized: {Assistive devices:23999} Level of assistance: {Levels of assistance:24026} Comments: ***                                                                                                                                TREATMENT DATE:  08/15/2023  Vitals: BP: HR: O2 sat:  Evaluation: -ROM measured, Strength assessed, HEP prescribed, pt educated on prognosis, findings, and importance of HEP compliance if given.  Manual Therapy: -CPA of Lumbar Spinal segments L3-L5, grade II-III mobilizations -STM of Lumbar Paraspinal musculature  Therapeutic Exercise: -Supine bridges 2 sets of 10 reps, 3 second holds, symptomatic, pt cued for max hip extension -Standing 3 way hip 1 sets 10 reps, bilaterally, pt cued for upright trunk and maintaining of neutral spine -Lateral stepping 3 laps 20 feet per lap, second 2 with RTB around ankles, pt cued for upright posture -Forward lunges, 1 set of 5 reps better performance going into RLE, pt cued for core activation and upright posture       PATIENT EDUCATION:  Education details: Pt was educated on findings of PT evaluation, prognosis, frequency of therapy visits and rationale, attendance policy, and HEP if given.   Person educated: {Person educated:25204} Education method: {Education Method:25205} Education comprehension: {Education Comprehension:25206}  HOME EXERCISE PROGRAM: ***  ASSESSMENT:  CLINICAL IMPRESSION: Patient is a 83 y.o. male who was seen today for physical therapy evaluation and treatment for M25.562,G89.29 (ICD-10-CM) - Chronic pain of left knee M48.061 (ICD-10-CM) - Degenerative lumbar spinal stenosis.   Patient demonstrates decreased LE strength, abnormal pain rating, and impaired balance. Patient also demonstrates difficulty with ambulation during today's session with  decreased stride length and velocity noted. Patient also demonstrates ***. Patient requires ***. Patient would benefit from skilled physical therapy for increased endurance with ambulation, increased LE strength, and balance for improved gait quality, return to higher level of function with ADLs, and progress towards therapy goals.   OBJECTIVE IMPAIRMENTS: {opptimpairments:25111}.   ACTIVITY LIMITATIONS: {activitylimitations:27494}  PARTICIPATION LIMITATIONS: {participationrestrictions:25113}  PERSONAL FACTORS: {Personal factors:25162} are also affecting patient's functional outcome.   REHAB POTENTIAL: {rehabpotential:25112}  CLINICAL DECISION MAKING: {clinical decision making:25114}  EVALUATION COMPLEXITY: {Evaluation complexity:25115}   GOALS: Goals reviewed with patient? {yes/no:20286}  SHORT TERM GOALS: Target date: ***  Patient will demonstrate evidence of independence with individualized HEP and will report compliance for at least 3 days per week for optimized progression towards remaining therapy goals. Baseline: *** Goal status: {GOALSTATUS:25110}  2.  Patient will report a decrease in pain level during community ambulation by at least 2 points for improved quality of life. Baseline: *** Goal status: {GOALSTATUS:25110}     LONG TERM GOALS: Target date: ***  Pt will demonstrate a an increase of at least 9 points on the LEFS for improved performance of community ambulation and ADL. Baseline: *** Goal status: {GOALSTATUS:25110}  2.  Pt will improve 2 MWT by *** in order to demonstrate improved functional ambulatory capacity in community setting.  Baseline: *** Goal status: {GOALSTATUS:25110}  3.  Pt will demonstrate WFL ROM (flexion and extension) in right knee, for increased mobility and maximal efficiency of gait cycle during ambulation. Baseline: *** Goal status: {GOALSTATUS:25110}  4.  Pt will demonstrate at least 4-/5 MMT for right lower extremity for  increased strength during ADL and community ambulation. Baseline: *** Goal status: {GOALSTATUS:25110}  5.  Pt will improve *** by *** in order to improve *** during functional activities.. Baseline: *** Goal status: {GOALSTATUS:25110}    PLAN:  PT FREQUENCY: {rehab frequency:25116}  PT DURATION: {rehab duration:25117}  PLANNED INTERVENTIONS: {rehab planned interventions:25118::97110-Therapeutic exercises,97530- Therapeutic [phone]- Neuromuscular re-education,97535- Self Rjmz,02859- Manual therapy}  PLAN FOR NEXT SESSION: ***   Lang Ada, PT, DPT [facility] Office: [phone] 7:48 AM, 08/15/23

## 2023-08-24 DIAGNOSIS — Z79899 Other long term (current) drug therapy: Secondary | ICD-10-CM | POA: Diagnosis not present

## 2023-08-24 DIAGNOSIS — J449 Chronic obstructive pulmonary disease, unspecified: Secondary | ICD-10-CM | POA: Diagnosis not present

## 2023-08-24 DIAGNOSIS — L03116 Cellulitis of left lower limb: Secondary | ICD-10-CM | POA: Diagnosis not present

## 2023-08-24 DIAGNOSIS — R531 Weakness: Secondary | ICD-10-CM | POA: Diagnosis not present

## 2023-08-24 DIAGNOSIS — F419 Anxiety disorder, unspecified: Secondary | ICD-10-CM | POA: Diagnosis not present

## 2023-08-28 DIAGNOSIS — E785 Hyperlipidemia, unspecified: Secondary | ICD-10-CM | POA: Diagnosis not present

## 2023-08-28 DIAGNOSIS — I2510 Atherosclerotic heart disease of native coronary artery without angina pectoris: Secondary | ICD-10-CM | POA: Diagnosis not present

## 2023-08-28 DIAGNOSIS — I714 Abdominal aortic aneurysm, without rupture, unspecified: Secondary | ICD-10-CM | POA: Diagnosis not present

## 2023-08-28 DIAGNOSIS — R109 Unspecified abdominal pain: Secondary | ICD-10-CM | POA: Diagnosis not present

## 2023-08-28 DIAGNOSIS — K5130 Ulcerative (chronic) rectosigmoiditis without complications: Secondary | ICD-10-CM | POA: Diagnosis not present

## 2023-08-28 DIAGNOSIS — N1831 Chronic kidney disease, stage 3a: Secondary | ICD-10-CM | POA: Diagnosis not present

## 2023-09-07 ENCOUNTER — Encounter: Payer: Self-pay | Admitting: Gastroenterology

## 2023-09-12 NOTE — Therapy (Unsigned)
 OUTPATIENT PHYSICAL THERAPY LOWER EXTREMITY EVALUATION   Patient Name: Alex Lara. MRN: [id number] DOB:06/24/1940, 83 y.o., male Today's Date: 09/13/2023  END OF SESSION:  PT End of Session - 09/13/23 1258     Visit Number 1    Number of Visits 10    Date for PT Re-Evaluation 10/11/23    Authorization Type [employer]    Authorization Time Period Auth requested    Progress Note Due on Visit 10    PT Start Time 1302    PT Stop Time 1345    PT Time Calculation (min) 43 min    Activity Tolerance Patient tolerated treatment well    Behavior During Therapy WFL for tasks assessed/performed          Past Medical History:  Diagnosis Date   Anxiety and depression    Arteriosclerotic cardiovascular disease (ASCVD)    06/2008: DES to the RCA and LAD   Blood clotting disorder (HCC)    Carpal tunnel syndrome    Chronic anticoagulation    Deep vein thrombosis (HCC)    Degenerative joint disease    Gastroesophageal reflux disease    Hyperlipidemia    Hypertension    Myocardial infarction (HCC)    1970   Prostate cancer (HCC)    Stroke (HCC)    Tobacco abuse    Past Surgical History:  Procedure Laterality Date   BACK SURGERY  1988   CARDIAC CATHETERIZATION  2004   stents   CATARACT EXTRACTION W/PHACO Right 04/04/2022   Procedure: CATARACT EXTRACTION PHACO AND INTRAOCULAR LENS PLACEMENT (IOC);  Surgeon: Harrie Agent, MD;  Location: [facility];  Service: Ophthalmology;  Laterality: Right;  CDE: 10.63   COLONOSCOPY  ?  Date   COLONOSCOPY WITH PROPOFOL  N/A 07/14/2021   Procedure: COLONOSCOPY WITH PROPOFOL ;  Surgeon: Cindie Carlin POUR, DO;  Location: [facility];  Service: Endoscopy;  Laterality: N/A;  8:15am, asa 3   IM NAILING HUMERUS Left    INGUINAL HERNIA REPAIR     ORIF-unknown fracture     POLYPECTOMY  07/14/2021   Procedure: POLYPECTOMY;  Surgeon: Cindie Carlin POUR, DO;  Location: [facility];  Service: Endoscopy;;   PROSTATE BIOPSY     ROTATOR CUFF REPAIR      Patient Active Problem List   Diagnosis Date Noted   Constipation 11/01/2022   Dysphagia 11/01/2022   Prolapsed internal hemorrhoids, grade 3 05/26/2022   Grade II hemorrhoids 10/21/2021   Hemorrhoids 09/30/2021   Bradycardia 08/16/2021   Hypotension 08/16/2021   Aortic aneurysm (HCC) 08/16/2021   AKI (acute kidney injury) (HCC) 08/16/2021   Rectal pain 08/10/2021   Erectile dysfunction due to arterial insufficiency 09/13/2019   Weak urinary stream 09/13/2019   Benign prostatic hyperplasia with urinary obstruction    Depression with anxiety    Stroke-like symptoms 01/30/2019   Stroke (HCC) 01/30/2019   Malignant neoplasm of prostate (HCC) 04/22/2018   Chest tightness 05/04/2016   Dyspnea on exertion 05/04/2016   Coronary artery disease involving native coronary artery of native heart without angina pectoris    Hyperlipidemia    Tobacco abuse    Deep vein thrombosis (HCC)    Gastroesophageal reflux disease    Hypertension    Osteoarthritis 02/14/2006    PCP: Sheryle Carwin, MD  REFERRING PROVIDER: Margrette Taft BRAVO, MD  REFERRING DIAG: [id number] (ICD-10-CM) - Chronic pain of left knee M48.061 (ICD-10-CM) - Degenerative lumbar spinal stenosis  THERAPY DIAG:  Muscle weakness (generalized)  Impaired functional mobility,  balance, gait, and endurance  Decreased ROM of lumbar spine  Rationale for Evaluation and Treatment: Rehabilitation  ONSET DATE:  Chronic - Years  SUBJECTIVE:   SUBJECTIVE STATEMENT: Patient's son contributes to subjective history taking, as pt is very HOH and demonstrates some level of cognitive decline as per son since CVA. Patient reports he's here for his side/flank pain and leg weakness. Reports he notices his side pain at night most but reports it hurts most all the time, just worst at night. Is unable to describe the side pain. Patient and son report at one time, MD prescribed him a medicine that helped with the side pain' pain disappeared  completely but now its returned and haven't been able to get a refill. Says twisting hurts somewhat but motion doesn't usually irritate pain. Pt sons reports pt has an infection in lower intestines and that patient drinks a lot of caffeine and isn't very active. Pt reports increased pain in legs when walking, reports they just get tired.  PERTINENT HISTORY: CVA Previously broken L femur - had rod Previous Cardiac surgery Previous Lumbar Surgery Shoulder Replacements  PAIN:  Are you having pain? Yes: NPRS scale: 3/10 Pain location: Both sides/flanks Pain description: Cannot describe Aggravating factors: twisting, but always in some pain   Relieving factors: Medication  PRECAUTIONS: None  RED FLAGS: Pt reports constant numbness and tingling in both hands and feet. Reports urge with urination. Reports taking diuretic.    WEIGHT BEARING RESTRICTIONS: No  FALLS:  Has patient fallen in last 6 months? No  LIVING ENVIRONMENT: Lives with: lives with their family and lives with their spouse Wife and son Lives in: House/apartment Stairs: Yes: External: 3-5 steps; none Has following equipment at home: None  OCCUPATION: N/A  PLOF: Independent, son reports he had to help with washing patient's back secondary to shoulder injuries but usually independent  PATIENT GOALS: Run and less pain  NEXT MD VISIT: 09/15/23 with Margrette   OBJECTIVE:  Note: Objective measures were completed at Evaluation unless otherwise noted.  DIAGNOSTIC FINDINGS:  Left knee pain   Chronic left knee pain   Status post nailing of the left femur remote history of fracture   There is calcification of the popliteal vessel   There is chondrocalcinosis of the medial and lateral joints there is also squaring of the condyle as well medially.  The joint space however is maintained.  No effusion   Impression chondrocalcinosis, osteoarthritis, previous femoral nailing no complications  Low back from  2023: CLINICAL DATA:  Back pain   EXAM: LUMBAR SPINE - COMPLETE 4+ VIEW   COMPARISON:  CT abdomen and pelvis 03/23/2018   FINDINGS: Mild levocurvature of the lumbar spine. Lumbar vertebral body height are preserved without evidence of fracture. Minimal grade 1 retrolisthesis of L2 on L3 and L4 on L5. No spondylolysis visualized. Mild to moderate intervertebral disc space narrowing at L1-L2 and L4-L5. Facet arthropathy.   Calcified plaques in the abdominal aorta which measures up to 4 cm in diameter, appears increased since previous CT.   IMPRESSION: 1. Degenerative changes of the lumbar spine as described. 2. Distal abdominal aortic aneurysm appears to measure 4 cm in diameter which would be increased since previous CT. Follow-up ultrasound or cross-sectional imaging recommended.  PATIENT SURVEYS:  LEFS: Lower Extremity Functional Score: 44 / 80 = 55.0 %  COGNITION: Overall cognitive status: Pt somewhat confused in conversation at times. Son reports this as baseline since CVA.     POSTURE: rounded shoulders, forward  head, and increased thoracic kyphosis   LUMBAR ROM:   AROM eval  Flexion To mid shin   Extension 25% avail   Right lateral flexion   Left lateral flexion   Right rotation WFL  Left rotation WFL   (Blank rows = not tested)   LOWER EXTREMITY MMT:  MMT Right eval Left eval  Hip flexion 4 4- *  Hip extension    Hip abduction 4 seated 4 seated  Hip adduction 4 seated 4 seated   Hip internal rotation    Hip external rotation    Knee flexion    Knee extension    Ankle dorsiflexion 5 5  Ankle plantarflexion    Ankle inversion    Ankle eversion     (Blank rows = not tested)    *=pain  LOWER EXTREMITY ROM:  ROM Right eval Left eval  Hip flexion    Hip extension    Hip abduction    Hip adduction    Hip internal rotation    Hip external rotation    Knee flexion    Knee extension    Ankle dorsiflexion    Ankle plantarflexion    Ankle  inversion    Ankle eversion     (Blank rows = not tested)  FUNCTIONAL TESTS:  30 seconds chair stand test 2 minute walk test: Next Session   30 second chair stand: 10.5, BUE use - push off assist, LE pain - reports they feel tired.   GAIT: Distance walked: 75 ft from entrance to separate room  Assistive device utilized: N/A Level of assistance: Complete Independence Comments: Small step lengths bilaterally, dec hip ext/flex, knee flexed throughout cycle, forward trunk, dec arm swing                                                                                                                                TREATMENT DATE:  09/13/2023: PT Eval and HEP   PATIENT EDUCATION:  Education details: Pt was educated on findings of PT evaluation, prognosis, frequency of therapy visits and rationale, attendance policy, and HEP if given. Person educated: Patient and Child(ren) Education method: Medical illustrator Education comprehension: verbalized understanding and returned demonstration  HOME EXERCISE PROGRAM: Access Code: [email or web address] URL: [email or web address] Date: 09/13/2023 Prepared by: Rosaria Powell-Butler  Exercises - Supine Lower Trunk Rotation  - 2 x daily - 7 x weekly - 3 sets - 10 reps - Supine Bridge  - 2 x daily - 7 x weekly - 3 sets - 10 reps - Seated Long Arc Quad  - 2 x daily - 7 x weekly - 3 sets - 10 reps - Sit to Stand  - 2 x daily - 7 x weekly - 3 sets - 10 reps  ASSESSMENT:  CLINICAL IMPRESSION: Patient is a 83 y.o. male who was seen today for physical therapy evaluation and treatment for M25.562,G89.29 (ICD-10-CM) - Chronic pain of left knee M48.061 (ICD-10-CM) -  Degenerative lumbar spinal stenosis.  On this date, patient reports main concern being his side/flank pain bilaterally and LE weakness/fatigue with activity. Patient demonstrates decreased lumbar ROM (flexion and extension), decreased LE strength, impaired gait, decreased LE endurance,  and difficulty with transfers, which are limited mostly due to dizziness/room spinning sensation. Patient reports that motion doesn't typically cause an increase in side/flank pain, that pain is more constant, and he feels it more at night/end of day. Reports his MD prescribed a medication (unsure of pain medication) that took pain away completely but that he ran out. Patient and son educated that PT cannot prescribe or advise on medication but to contact primary or referring MD as symptoms may not only be musculoskeletal as patient also describes increased urge with urination. Patient given HEP to target lumbar ROM and LE strength to start PT. Patient will benefit from continued skilled physical therapy in order to address the above in order to improve function and quality of life.    OBJECTIVE IMPAIRMENTS: Abnormal gait, decreased activity tolerance, decreased balance, decreased cognition, decreased coordination, decreased endurance, decreased mobility, difficulty walking, decreased ROM, decreased strength, dizziness, impaired flexibility, improper body mechanics, postural dysfunction, and pain.   ACTIVITY LIMITATIONS: carrying, lifting, bending, standing, squatting, stairs, transfers, and bed mobility  PARTICIPATION LIMITATIONS: meal prep, cleaning, laundry, community activity, and yard work  PERSONAL FACTORS: Time since onset of injury/illness/exacerbation are also affecting patient's functional outcome.   REHAB POTENTIAL: Fair    CLINICAL DECISION MAKING: Evolving/moderate complexity  EVALUATION COMPLEXITY: Low   GOALS: Goals reviewed with patient? No  SHORT TERM GOALS: Target date: 10/03/23 Patient will be independent with performance of HEP to demonstrate adequate self management of symptoms.  Baseline:  Goal status: INITIAL  2.   Patient will report at least a 25% improvement with function and/or pain reduction overall since beginning PT. Baseline:  Goal status: INITIAL      LONG TERM GOALS: Target date: 10/13/23 Patient will improve LEFS score by 10% in order to demonstrate improved self-perceived disability and overall function while meeting MCID.  Baseline: Goal status: INITIAL   2.  Patient will improve  30 second chair stand  test by 2 STS  in order to demonstrate improved LE strength and endurance required for functional transfers. Baseline:  Goal status: INITIAL   3.  Patient will report decreased side/flank pain as 2/10 or lower at end of day following completion of daily tasks. Baseline:  Goal status: INITIAL   4.  Patient will report overall 50% improvement since beginning PT. Baseline:  Goal status: INITIAL  5.  Patient will improve 2 minute walk test by 20 ft in order to demonstrate improved gait speed and activity tolerance needed for community ambulation. Baseline:  Goal status: INITIAL   PLAN:  PT FREQUENCY: 2x/week  PT DURATION: 8 weeks  PLANNED INTERVENTIONS: 97110-Therapeutic exercises, 97530- Therapeutic activity, [id number]- Neuromuscular re-education, 97535- Self Care, 02859- Manual therapy, [phone]- Gait training, [phone]- Electrical stimulation (manual), [id number]- Traction (mechanical), [phone] (1-2 muscles), [id number] (3+ muscles)- Dry Needling, Patient/Family education, Balance training, Stair training, Taping, Joint mobilization, Spinal mobilization, Cryotherapy, and Moist heat  PLAN FOR NEXT SESSION: Review HEP and goals, 2 Minute walk test, discussion on drinking more water  and more walking each day, cont LE strengthening, core strengthening, general stretching   2:55 PM, 09/13/23 Rosaria Settler, PT, DPT [place] with [facility] Auth Request Treatment Start Date: 09/13/23  Referring diagnosis code (ICD 10)? M25.562,G89.29 (ICD-10-CM) - Chronic  pain of left knee M48.061 (ICD-10-CM) - Degenerative lumbar spinal stenosis Treatment diagnosis codes (ICD 10)? (if different than referring diagnosis) M62.81,  Z74.09, M53.86 What was this (referring dx) caused by? []  Surgery []  Fall [x]  Ongoing issue []  Arthritis []  Other: ____________  Laterality: []  Rt []  Lt [x]  Both  Deficits: [x]  Pain []  Stiffness [x]  Weakness []  Edema []  Balance Deficits []  Coordination [x]  Gait Disturbance [x]  ROM []  Other   Functional Tool Score:  LEFS: Lower Extremity Functional Score: 44 / 80 = 55.0 % 30 second chair stand: 10  CPT codes: See Planned Interventions listed in the Plan section of the Evaluation.

## 2023-09-13 ENCOUNTER — Ambulatory Visit (HOSPITAL_COMMUNITY): Attending: Orthopedic Surgery

## 2023-09-13 ENCOUNTER — Encounter (HOSPITAL_COMMUNITY): Payer: Self-pay

## 2023-09-13 ENCOUNTER — Other Ambulatory Visit: Payer: Self-pay

## 2023-09-13 DIAGNOSIS — Z7409 Other reduced mobility: Secondary | ICD-10-CM | POA: Insufficient documentation

## 2023-09-13 DIAGNOSIS — G8929 Other chronic pain: Secondary | ICD-10-CM | POA: Insufficient documentation

## 2023-09-13 DIAGNOSIS — M48061 Spinal stenosis, lumbar region without neurogenic claudication: Secondary | ICD-10-CM | POA: Insufficient documentation

## 2023-09-13 DIAGNOSIS — M5386 Other specified dorsopathies, lumbar region: Secondary | ICD-10-CM | POA: Insufficient documentation

## 2023-09-13 DIAGNOSIS — R2689 Other abnormalities of gait and mobility: Secondary | ICD-10-CM | POA: Insufficient documentation

## 2023-09-13 DIAGNOSIS — M6281 Muscle weakness (generalized): Secondary | ICD-10-CM | POA: Insufficient documentation

## 2023-09-13 DIAGNOSIS — M25562 Pain in left knee: Secondary | ICD-10-CM | POA: Diagnosis not present

## 2023-09-15 ENCOUNTER — Ambulatory Visit: Admitting: Orthopedic Surgery

## 2023-09-15 DIAGNOSIS — M1711 Unilateral primary osteoarthritis, right knee: Secondary | ICD-10-CM

## 2023-09-15 DIAGNOSIS — M170 Bilateral primary osteoarthritis of knee: Secondary | ICD-10-CM | POA: Diagnosis not present

## 2023-09-15 DIAGNOSIS — M1712 Unilateral primary osteoarthritis, left knee: Secondary | ICD-10-CM

## 2023-09-15 MED ORDER — METHYLPREDNISOLONE ACETATE 40 MG/ML IJ SUSP
40.0000 mg | Freq: Once | INTRAMUSCULAR | Status: AC
  Administered 2023-09-15: 40 mg via INTRA_ARTICULAR

## 2023-09-15 NOTE — Progress Notes (Signed)
 Chief Complaint  Patient presents with   Injections    Bilat knees   Encounter Diagnoses  Name Primary?   Primary osteoarthritis of left knee Yes   Primary osteoarthritis of right knee     Alex Lara comes in for requested bilateral knee injections  Still having bilateral chronic knee pain from osteoarthritis and he is a poor surgical candidate   Procedure note for bilateral knee injections  Procedure note left knee injection verbal consent was obtained to inject left knee joint  Timeout was completed to confirm the site of injection  The medications used were 40 mg depomedrol and 3 cc of 1% lidocaine   Anesthesia was provided by ethyl chloride and the skin was prepped with alcohol.  After cleaning the skin with alcohol a 20-gauge needle was used to inject the left knee joint. There were no complications. A sterile bandage was applied.   Procedure note right knee injection verbal consent was obtained to inject right knee joint  Timeout was completed to confirm the site of injection  The medications used were 40 mg depomedrol and 3 cc of 1% lidocaine   Anesthesia was provided by ethyl chloride and the skin was prepped with alcohol.  After cleaning the skin with alcohol a 20-gauge needle was used to inject the right knee joint. There were no complications. A sterile bandage was applied.

## 2023-09-15 NOTE — Progress Notes (Signed)
 SABRA

## 2023-09-20 ENCOUNTER — Ambulatory Visit (HOSPITAL_COMMUNITY)

## 2023-09-20 ENCOUNTER — Encounter (HOSPITAL_COMMUNITY): Payer: Self-pay

## 2023-09-20 DIAGNOSIS — Z7409 Other reduced mobility: Secondary | ICD-10-CM

## 2023-09-20 DIAGNOSIS — M25562 Pain in left knee: Secondary | ICD-10-CM | POA: Diagnosis not present

## 2023-09-20 DIAGNOSIS — M6281 Muscle weakness (generalized): Secondary | ICD-10-CM | POA: Diagnosis not present

## 2023-09-20 DIAGNOSIS — M5386 Other specified dorsopathies, lumbar region: Secondary | ICD-10-CM | POA: Diagnosis not present

## 2023-09-20 DIAGNOSIS — R2689 Other abnormalities of gait and mobility: Secondary | ICD-10-CM | POA: Diagnosis not present

## 2023-09-20 DIAGNOSIS — G8929 Other chronic pain: Secondary | ICD-10-CM | POA: Diagnosis not present

## 2023-09-20 DIAGNOSIS — M48061 Spinal stenosis, lumbar region without neurogenic claudication: Secondary | ICD-10-CM | POA: Diagnosis not present

## 2023-09-20 NOTE — Therapy (Signed)
 OUTPATIENT PHYSICAL THERAPY LOWER EXTREMITY TREATMENT   Patient Name: Alex Lara. MRN: [id number] DOB:[birth year], 83 y.o., male Today's Date: 09/20/2023  END OF SESSION:  PT End of Session - 09/20/23 1516     Visit Number 2    Number of Visits 10    Date for PT Re-Evaluation 10/11/23    Authorization Type [employer]    Authorization Time Period cohere approved 10 visits from 09/13/23-12/12/2023    Progress Note Due on Visit 10    PT Start Time 1516    PT Stop Time 1600    PT Time Calculation (min) 44 min    Activity Tolerance Patient tolerated treatment well    Behavior During Therapy [facility] for tasks assessed/performed          Past Medical History:  Diagnosis Date   Anxiety and depression    Arteriosclerotic cardiovascular disease (ASCVD)    06/2008: DES to the RCA and LAD   Blood clotting disorder (HCC)    Carpal tunnel syndrome    Chronic anticoagulation    Deep vein thrombosis (HCC)    Degenerative joint disease    Gastroesophageal reflux disease    Hyperlipidemia    Hypertension    Myocardial infarction (HCC)    1970   Prostate cancer (HCC)    Stroke (HCC)    Tobacco abuse    Past Surgical History:  Procedure Laterality Date   BACK SURGERY  1988   CARDIAC CATHETERIZATION  2004   stents   CATARACT EXTRACTION W/PHACO Right 04/04/2022   Procedure: CATARACT EXTRACTION PHACO AND INTRAOCULAR LENS PLACEMENT (IOC);  Surgeon: Harrie Agent, MD;  Location: [facility];  Service: Ophthalmology;  Laterality: Right;  CDE: 10.63   COLONOSCOPY  ?  Date   COLONOSCOPY WITH PROPOFOL  N/A 07/14/2021   Procedure: COLONOSCOPY WITH PROPOFOL ;  Surgeon: Cindie Carlin POUR, DO;  Location: [facility];  Service: Endoscopy;  Laterality: N/A;  8:15am, asa 3   IM NAILING HUMERUS Left    INGUINAL HERNIA REPAIR     ORIF-unknown fracture     POLYPECTOMY  07/14/2021   Procedure: POLYPECTOMY;  Surgeon: Cindie Carlin POUR, DO;  Location: [facility];  Service: Endoscopy;;    PROSTATE BIOPSY     ROTATOR CUFF REPAIR     Patient Active Problem List   Diagnosis Date Noted   Constipation 11/01/2022   Dysphagia 11/01/2022   Prolapsed internal hemorrhoids, grade 3 05/26/2022   Grade II hemorrhoids 10/21/2021   Hemorrhoids 09/30/2021   Bradycardia 08/16/2021   Hypotension 08/16/2021   Aortic aneurysm (HCC) 08/16/2021   AKI (acute kidney injury) (HCC) 08/16/2021   Rectal pain 08/10/2021   Erectile dysfunction due to arterial insufficiency 09/13/2019   Weak urinary stream 09/13/2019   Benign prostatic hyperplasia with urinary obstruction    Depression with anxiety    Stroke-like symptoms 01/30/2019   Stroke (HCC) 01/30/2019   Malignant neoplasm of prostate (HCC) 04/22/2018   Chest tightness 05/04/2016   Dyspnea on exertion 05/04/2016   Coronary artery disease involving native coronary artery of native heart without angina pectoris    Hyperlipidemia    Tobacco abuse    Deep vein thrombosis (HCC)    Gastroesophageal reflux disease    Hypertension    Osteoarthritis 02/14/2006    PCP: Sheryle Carwin, MD  REFERRING PROVIDER: Margrette Taft BRAVO, MD  REFERRING DIAG: [phone] (ICD-10-CM) - Chronic pain of left knee M48.061 (ICD-10-CM) - Degenerative lumbar spinal stenosis  THERAPY DIAG:  Muscle weakness (generalized)  Impaired functional mobility, balance, gait, and endurance  Decreased ROM of lumbar spine  Rationale for Evaluation and Treatment: Rehabilitation  ONSET DATE:  Chronic - Years  SUBJECTIVE:   SUBJECTIVE STATEMENT: 09/20/23:  Sciatic pain going down Lt hip to his little toe, pain scale 2/10.  Has been compliance with current HEP 1x daily without questions.  Has apt with doctor Arlean.  Pt feels he doesn't need therapy then stated he feels he walks too slow but has adjusted to issues.  Eval:  Patient's son contributes to subjective history taking, as pt is very HOH and demonstrates some level of cognitive decline as per son since CVA.  Patient reports he's here for his side/flank pain and leg weakness. Reports he notices his side pain at night most but reports it hurts most all the time, just worst at night. Is unable to describe the side pain. Patient and son report at one time, MD prescribed him a medicine that helped with the side pain' pain disappeared completely but now its returned and haven't been able to get a refill. Says twisting hurts somewhat but motion doesn't usually irritate pain. Pt sons reports pt has an infection in lower intestines and that patient drinks a lot of caffeine and isn't very active. Pt reports increased pain in legs when walking, reports they just get tired.  PERTINENT HISTORY: CVA Previously broken L femur - had rod Previous Cardiac surgery Previous Lumbar Surgery Shoulder Replacements  PAIN:  Are you having pain? Yes: NPRS scale: 2/10 Pain location: Lt sides/flanks with radicular symptoms Pain description: Cannot describe Aggravating factors: twisting, but always in some pain   Relieving factors: Medication  PRECAUTIONS: None  RED FLAGS: Pt reports constant numbness and tingling in both hands and feet. Reports urge with urination. Reports taking diuretic.    WEIGHT BEARING RESTRICTIONS: No  FALLS:  Has patient fallen in last 6 months? No  LIVING ENVIRONMENT: Lives with: lives with their family and lives with their spouse Wife and son Lives in: House/apartment Stairs: Yes: External: 3-5 steps; none Has following equipment at home: None  OCCUPATION: N/A  PLOF: Independent, son reports he had to help with washing patient's back secondary to shoulder injuries but usually independent  PATIENT GOALS: Run and less pain  NEXT MD VISIT: 09/15/23 with Margrette   OBJECTIVE:  Note: Objective measures were completed at Evaluation unless otherwise noted.  DIAGNOSTIC FINDINGS:  Left knee pain   Chronic left knee pain   Status post nailing of the left femur remote history of  fracture   There is calcification of the popliteal vessel   There is chondrocalcinosis of the medial and lateral joints there is also squaring of the condyle as well medially.  The joint space however is maintained.  No effusion   Impression chondrocalcinosis, osteoarthritis, previous femoral nailing no complications  Low back from 2023: CLINICAL DATA:  Back pain   EXAM: LUMBAR SPINE - COMPLETE 4+ VIEW   COMPARISON:  CT abdomen and pelvis 03/23/2018   FINDINGS: Mild levocurvature of the lumbar spine. Lumbar vertebral body height are preserved without evidence of fracture. Minimal grade 1 retrolisthesis of L2 on L3 and L4 on L5. No spondylolysis visualized. Mild to moderate intervertebral disc space narrowing at L1-L2 and L4-L5. Facet arthropathy.   Calcified plaques in the abdominal aorta which measures up to 4 cm in diameter, appears increased since previous CT.   IMPRESSION: 1. Degenerative changes of the lumbar spine as described. 2. Distal abdominal aortic aneurysm appears  to measure 4 cm in diameter which would be increased since previous CT. Follow-up ultrasound or cross-sectional imaging recommended.  PATIENT SURVEYS:  LEFS: Lower Extremity Functional Score: 44 / 80 = 55.0 %  COGNITION: Overall cognitive status: Pt somewhat confused in conversation at times. Son reports this as baseline since CVA.     POSTURE: rounded shoulders, forward head, and increased thoracic kyphosis   LUMBAR ROM:   AROM eval  Flexion To mid shin   Extension 25% avail   Right lateral flexion   Left lateral flexion   Right rotation WFL  Left rotation WFL   (Blank rows = not tested)   LOWER EXTREMITY MMT:  MMT Right eval Left eval  Hip flexion 4 4- *  Hip extension    Hip abduction 4 seated 4 seated  Hip adduction 4 seated 4 seated   Hip internal rotation    Hip external rotation    Knee flexion    Knee extension    Ankle dorsiflexion 5 5  Ankle plantarflexion    Ankle  inversion    Ankle eversion     (Blank rows = not tested)    *=pain  LOWER EXTREMITY ROM:  ROM Right eval Left eval  Hip flexion    Hip extension    Hip abduction    Hip adduction    Hip internal rotation    Hip external rotation    Knee flexion    Knee extension    Ankle dorsiflexion    Ankle plantarflexion    Ankle inversion    Ankle eversion     (Blank rows = not tested)  FUNCTIONAL TESTS:  30 seconds chair stand test 2 minute walk test: Next Session   30 second chair stand: 10.5, BUE use - push off assist, LE pain - reports they feel tired.   GAIT: Distance walked: 75 ft from entrance to separate room  Assistive device utilized: N/A Level of assistance: Complete Independence Comments: Small step lengths bilaterally, dec hip ext/flex, knee flexed throughout cycle, forward trunk, dec arm swing                                                                                                                                TREATMENT DATE:  09/20/23: Educated purpose and benefits with therapy Reviewed goals 242 ft no AD O2 saturation with reports of labored breathing 92--> 96% HR 60 Restroom break 15:45-->15:49 Supine: Instructed log rolling LTR 5x 10 Bridge 10x SKTC 3x 20 Seated:  STS 10x    09/13/2023: PT Eval and HEP   PATIENT EDUCATION:  Education details: Pt was educated on findings of PT evaluation, prognosis, frequency of therapy visits and rationale, attendance policy, and HEP if given. Person educated: Patient and Child(ren) Education method: Medical illustrator Education comprehension: verbalized understanding and returned demonstration  HOME EXERCISE PROGRAM: Access Code: [email or web address] URL: [email or web address] Date: 09/13/2023 Prepared by: Rosaria Powell-Butler  Exercises - Supine  Lower Trunk Rotation  - 2 x daily - 7 x weekly - 3 sets - 10 reps - Supine Bridge  - 2 x daily - 7 x weekly - 3 sets - 10 reps - Seated  Long Arc Quad  - 2 x daily - 7 x weekly - 3 sets - 10 reps - Sit to Stand  - 2 x daily - 7 x weekly - 3 sets - 10 reps  ASSESSMENT:  CLINICAL IMPRESSION: 09/20/23:  Began session educating purpose and overall benefits with PT as pt entered dept stating he didn't need therapy then stated he knows he is walking slower than he wishes and feels weak and pain present.  Reviewed goals and educated importance of HEP compliance for maximal benefits.  with slow labored movements and staggered gait with min guard for safety during test.  Reports of increased pain and labored breathing following , O2 saturation checked with range from 92-96%.  Pt is a cigarette smoker and reports he smokes a pack and half a day, not interested in any smoking cessation classes.  Pt with need for restroom break during session, reports he has to pee every hour.  Discussed importance of drinking water .  Pt c/o lightheaded/spinning upon sitting up from supine position.  No reports of increased pain through session.    Eval:  Patient is a 83 y.o. male who was seen today for physical therapy evaluation and treatment for M25.562,G89.29 (ICD-10-CM) - Chronic pain of left knee M48.061 (ICD-10-CM) - Degenerative lumbar spinal stenosis.  On this date, patient reports main concern being his side/flank pain bilaterally and LE weakness/fatigue with activity. Patient demonstrates decreased lumbar ROM (flexion and extension), decreased LE strength, impaired gait, decreased LE endurance, and difficulty with transfers, which are limited mostly due to dizziness/room spinning sensation. Patient reports that motion doesn't typically cause an increase in side/flank pain, that pain is more constant, and he feels it more at night/end of day. Reports his MD prescribed a medication (unsure of pain medication) that took pain away completely but that he ran out. Patient and son educated that PT cannot prescribe or advise on medication but to contact  primary or referring MD as symptoms may not only be musculoskeletal as patient also describes increased urge with urination. Patient given HEP to target lumbar ROM and LE strength to start PT. Patient will benefit from continued skilled physical therapy in order to address the above in order to improve function and quality of life.    OBJECTIVE IMPAIRMENTS: Abnormal gait, decreased activity tolerance, decreased balance, decreased cognition, decreased coordination, decreased endurance, decreased mobility, difficulty walking, decreased ROM, decreased strength, dizziness, impaired flexibility, improper body mechanics, postural dysfunction, and pain.   ACTIVITY LIMITATIONS: carrying, lifting, bending, standing, squatting, stairs, transfers, and bed mobility  PARTICIPATION LIMITATIONS: meal prep, cleaning, laundry, community activity, and yard work  PERSONAL FACTORS: Time since onset of injury/illness/exacerbation are also affecting patient's functional outcome.   REHAB POTENTIAL: Fair    CLINICAL DECISION MAKING: Evolving/moderate complexity  EVALUATION COMPLEXITY: Low   GOALS: Goals reviewed with patient? No  SHORT TERM GOALS: Target date: 10/03/23 Patient will be independent with performance of HEP to demonstrate adequate self management of symptoms.  Baseline:  Goal status: INITIAL  2.   Patient will report at least a 25% improvement with function and/or pain reduction overall since beginning PT. Baseline:  Goal status: INITIAL     LONG TERM GOALS: Target date: 10/13/23 Patient will improve LEFS score by 10% in  order to demonstrate improved self-perceived disability and overall function while meeting MCID.  Baseline: Goal status: INITIAL   2.  Patient will improve  30 second chair stand  test by 2 STS  in order to demonstrate improved LE strength and endurance required for functional transfers. Baseline:  Goal status: INITIAL   3.  Patient will report decreased side/flank pain  as 2/10 or lower at end of day following completion of daily tasks. Baseline:  Goal status: INITIAL   4.  Patient will report overall 50% improvement since beginning PT. Baseline:  Goal status: INITIAL  5.  Patient will improve 2 minute walk test by 20 ft in order to demonstrate improved gait speed and activity tolerance needed for community ambulation. Baseline:  Goal status: INITIAL   PLAN:  PT FREQUENCY: 2x/week  PT DURATION: 8 weeks  PLANNED INTERVENTIONS: 97110-Therapeutic exercises, 97530- Therapeutic activity, [id number]- Neuromuscular re-education, 97535- Self Care, [id number]- Manual therapy, [id number]- Gait training, [phone]- Electrical stimulation (manual), [id number]- Traction (mechanical), [phone] (1-2 muscles), [id number] (3+ muscles)- Dry Needling, Patient/Family education, Balance training, Stair training, Taping, Joint mobilization, Spinal mobilization, Cryotherapy, and Moist heat  PLAN FOR NEXT SESSION: Discussion on drinking more water  and more walking each day, cont LE strengthening, core strengthening, general stretching.  Nustep for dynamic warmup.  Augustin Mclean, LPTA/CLT; CBIS [phone]  4:12 PM, 09/20/23

## 2023-09-22 ENCOUNTER — Encounter (HOSPITAL_COMMUNITY): Payer: Self-pay

## 2023-09-22 ENCOUNTER — Ambulatory Visit (HOSPITAL_COMMUNITY)

## 2023-09-22 DIAGNOSIS — M5386 Other specified dorsopathies, lumbar region: Secondary | ICD-10-CM | POA: Diagnosis not present

## 2023-09-22 DIAGNOSIS — M48061 Spinal stenosis, lumbar region without neurogenic claudication: Secondary | ICD-10-CM | POA: Diagnosis not present

## 2023-09-22 DIAGNOSIS — M6281 Muscle weakness (generalized): Secondary | ICD-10-CM | POA: Diagnosis not present

## 2023-09-22 DIAGNOSIS — M25562 Pain in left knee: Secondary | ICD-10-CM | POA: Diagnosis not present

## 2023-09-22 DIAGNOSIS — R2689 Other abnormalities of gait and mobility: Secondary | ICD-10-CM | POA: Diagnosis not present

## 2023-09-22 DIAGNOSIS — Z7409 Other reduced mobility: Secondary | ICD-10-CM | POA: Diagnosis not present

## 2023-09-22 DIAGNOSIS — G8929 Other chronic pain: Secondary | ICD-10-CM | POA: Diagnosis not present

## 2023-09-22 NOTE — Therapy (Signed)
 OUTPATIENT PHYSICAL THERAPY LOWER EXTREMITY TREATMENT   Patient Name: Alex Lara. MRN: [id number] DOB:1940/06/25, 83 y.o., male Today's Date: 09/22/2023  END OF SESSION:  PT End of Session - 09/22/23 1349     Visit Number 3    Number of Visits 10    Date for PT Re-Evaluation 10/11/23    Authorization Type [employer]    Authorization Time Period cohere approved 10 visits from 09/13/23-12/12/2023    Authorization - Visit Number 3    Authorization - Number of Visits 10    Progress Note Due on Visit 10    PT Start Time 1350    PT Stop Time 1421    PT Time Calculation (min) 31 min    Activity Tolerance Patient tolerated treatment well    Behavior During Therapy WFL for tasks assessed/performed          Past Medical History:  Diagnosis Date   Anxiety and depression    Arteriosclerotic cardiovascular disease (ASCVD)    06/2008: DES to the RCA and LAD   Blood clotting disorder (HCC)    Carpal tunnel syndrome    Chronic anticoagulation    Deep vein thrombosis (HCC)    Degenerative joint disease    Gastroesophageal reflux disease    Hyperlipidemia    Hypertension    Myocardial infarction (HCC)    1970   Prostate cancer (HCC)    Stroke (HCC)    Tobacco abuse    Past Surgical History:  Procedure Laterality Date   BACK SURGERY  1988   CARDIAC CATHETERIZATION  2004   stents   CATARACT EXTRACTION W/PHACO Right 04/04/2022   Procedure: CATARACT EXTRACTION PHACO AND INTRAOCULAR LENS PLACEMENT (IOC);  Surgeon: Harrie Agent, MD;  Location: [facility];  Service: Ophthalmology;  Laterality: Right;  CDE: 10.63   COLONOSCOPY  ?  Date   COLONOSCOPY WITH PROPOFOL  N/A 07/14/2021   Procedure: COLONOSCOPY WITH PROPOFOL ;  Surgeon: Cindie Carlin POUR, DO;  Location: [facility];  Service: Endoscopy;  Laterality: N/A;  8:15am, asa 3   IM NAILING HUMERUS Left    INGUINAL HERNIA REPAIR     ORIF-unknown fracture     POLYPECTOMY  07/14/2021   Procedure: POLYPECTOMY;  Surgeon:  Cindie Carlin POUR, DO;  Location: [facility];  Service: Endoscopy;;   PROSTATE BIOPSY     ROTATOR CUFF REPAIR     Patient Active Problem List   Diagnosis Date Noted   Constipation 11/01/2022   Dysphagia 11/01/2022   Prolapsed internal hemorrhoids, grade 3 05/26/2022   Grade II hemorrhoids 10/21/2021   Hemorrhoids 09/30/2021   Bradycardia 08/16/2021   Hypotension 08/16/2021   Aortic aneurysm (HCC) 08/16/2021   AKI (acute kidney injury) (HCC) 08/16/2021   Rectal pain 08/10/2021   Erectile dysfunction due to arterial insufficiency 09/13/2019   Weak urinary stream 09/13/2019   Benign prostatic hyperplasia with urinary obstruction    Depression with anxiety    Stroke-like symptoms 01/30/2019   Stroke (HCC) 01/30/2019   Malignant neoplasm of prostate (HCC) 04/22/2018   Chest tightness 05/04/2016   Dyspnea on exertion 05/04/2016   Coronary artery disease involving native coronary artery of native heart without angina pectoris    Hyperlipidemia    Tobacco abuse    Deep vein thrombosis (HCC)    Gastroesophageal reflux disease    Hypertension    Osteoarthritis 02/14/2006    PCP: Sheryle Carwin, MD  REFERRING PROVIDER: Margrette Taft BRAVO, MD  REFERRING DIAG: [phone] (ICD-10-CM) - Chronic pain  of left knee M48.061 (ICD-10-CM) - Degenerative lumbar spinal stenosis  THERAPY DIAG:  Muscle weakness (generalized)  Impaired functional mobility, balance, gait, and endurance  Decreased ROM of lumbar spine  Other abnormalities of gait and mobility  Rationale for Evaluation and Treatment: Rehabilitation  ONSET DATE:  Chronic - Years  SUBJECTIVE:   SUBJECTIVE STATEMENT: Pt reports still having LLE pain. Reports it as 8/10. Reports he started to not come today because of pain. Reports he thinks he's supposed to see Margrette on September 25th. Reports compliance to HEP.    Eval:  Patient's son contributes to subjective history taking, as pt is very HOH and demonstrates some  level of cognitive decline as per son since CVA. Patient reports he's here for his side/flank pain and leg weakness. Reports he notices his side pain at night most but reports it hurts most all the time, just worst at night. Is unable to describe the side pain. Patient and son report at one time, MD prescribed him a medicine that helped with the side pain' pain disappeared completely but now its returned and haven't been able to get a refill. Says twisting hurts somewhat but motion doesn't usually irritate pain. Pt sons reports pt has an infection in lower intestines and that patient drinks a lot of caffeine and isn't very active. Pt reports increased pain in legs when walking, reports they just get tired.  PERTINENT HISTORY: CVA Previously broken L femur - had rod Previous Cardiac surgery Previous Lumbar Surgery Shoulder Replacements  PAIN:  Are you having pain? Yes: NPRS scale: 2/10 Pain location: Lt sides/flanks with radicular symptoms Pain description: Cannot describe Aggravating factors: twisting, but always in some pain   Relieving factors: Medication  PRECAUTIONS: None  RED FLAGS: Pt reports constant numbness and tingling in both hands and feet. Reports urge with urination. Reports taking diuretic.    WEIGHT BEARING RESTRICTIONS: No  FALLS:  Has patient fallen in last 6 months? No  LIVING ENVIRONMENT: Lives with: lives with their family and lives with their spouse Wife and son Lives in: House/apartment Stairs: Yes: External: 3-5 steps; none Has following equipment at home: None  OCCUPATION: N/A  PLOF: Independent, son reports he had to help with washing patient's back secondary to shoulder injuries but usually independent  PATIENT GOALS: Run and less pain  NEXT MD VISIT: 09/15/23 with Margrette   OBJECTIVE:  Note: Objective measures were completed at Evaluation unless otherwise noted.  DIAGNOSTIC FINDINGS:  Left knee pain   Chronic left knee pain   Status post  nailing of the left femur remote history of fracture   There is calcification of the popliteal vessel   There is chondrocalcinosis of the medial and lateral joints there is also squaring of the condyle as well medially.  The joint space however is maintained.  No effusion   Impression chondrocalcinosis, osteoarthritis, previous femoral nailing no complications  Low back from 2023: CLINICAL DATA:  Back pain   EXAM: LUMBAR SPINE - COMPLETE 4+ VIEW   COMPARISON:  CT abdomen and pelvis 03/23/2018   FINDINGS: Mild levocurvature of the lumbar spine. Lumbar vertebral body height are preserved without evidence of fracture. Minimal grade 1 retrolisthesis of L2 on L3 and L4 on L5. No spondylolysis visualized. Mild to moderate intervertebral disc space narrowing at L1-L2 and L4-L5. Facet arthropathy.   Calcified plaques in the abdominal aorta which measures up to 4 cm in diameter, appears increased since previous CT.   IMPRESSION: 1. Degenerative changes of the  lumbar spine as described. 2. Distal abdominal aortic aneurysm appears to measure 4 cm in diameter which would be increased since previous CT. Follow-up ultrasound or cross-sectional imaging recommended.  PATIENT SURVEYS:  LEFS: Lower Extremity Functional Score: 44 / 80 = 55.0 %  COGNITION: Overall cognitive status: Pt somewhat confused in conversation at times. Son reports this as baseline since CVA.     POSTURE: rounded shoulders, forward head, and increased thoracic kyphosis   LUMBAR ROM:   AROM eval  Flexion To mid shin   Extension 25% avail   Right lateral flexion   Left lateral flexion   Right rotation WFL  Left rotation WFL   (Blank rows = not tested)   LOWER EXTREMITY MMT:  MMT Right eval Left eval  Hip flexion 4 4- *  Hip extension    Hip abduction 4 seated 4 seated  Hip adduction 4 seated 4 seated   Hip internal rotation    Hip external rotation    Knee flexion    Knee extension    Ankle  dorsiflexion 5 5  Ankle plantarflexion    Ankle inversion    Ankle eversion     (Blank rows = not tested)    *=pain  LOWER EXTREMITY ROM:  ROM Right eval Left eval  Hip flexion    Hip extension    Hip abduction    Hip adduction    Hip internal rotation    Hip external rotation    Knee flexion    Knee extension    Ankle dorsiflexion    Ankle plantarflexion    Ankle inversion    Ankle eversion     (Blank rows = not tested)  FUNCTIONAL TESTS:  30 seconds chair stand test 2 minute walk test: Next Session   30 second chair stand: 10.5, BUE use - push off assist, LE pain - reports they feel tired.   GAIT: Distance walked: 75 ft from entrance to separate room  Assistive device utilized: N/A Level of assistance: Complete Independence Comments: Small step lengths bilaterally, dec hip ext/flex, knee flexed throughout cycle, forward trunk, dec arm swing                                                                                                                                TREATMENT DATE:  09/22/23: STS from mat table, 10x --> 6x, inc pain in BLE Seated forward flexion with physioball, 1'  To R, with RUE only, 30, reports pain in   To L, with LUE only, 30, reports pain in L shoulder.  Seated march, 10x2 LAQ, 2x10 on RLE only due to pain in LLE Seated heel/toes raises, 20x Seated Hamstring stretch, 30 NuStep, seat 12, level 2 for 1.5 min, level 1 for 2.5 Session discontinued at pt request due to inc fatigue and pain    09/20/23: Educated purpose and benefits with therapy Reviewed goals 242 ft no AD O2 saturation with  reports of labored breathing 92--> 96% HR 60 Restroom break 15:45-->15:49 Supine: Instructed log rolling LTR 5x 10 Bridge 10x SKTC 3x 20 Seated:  STS 10x    09/13/2023: PT Eval and HEP   PATIENT EDUCATION:  Education details: Pt was educated on findings of PT evaluation, prognosis, frequency of therapy visits and rationale,  attendance policy, and HEP if given. Person educated: Patient and Child(ren) Education method: Medical illustrator Education comprehension: verbalized understanding and returned demonstration  HOME EXERCISE PROGRAM: Access Code: [email or web address] URL: [email or web address] Date: 09/13/2023 Prepared by: Rosaria Powell-Butler  Exercises - Supine Lower Trunk Rotation  - 2 x daily - 7 x weekly - 3 sets - 10 reps - Supine Bridge  - 2 x daily - 7 x weekly - 3 sets - 10 reps - Seated Long Arc Quad  - 2 x daily - 7 x weekly - 3 sets - 10 reps - Sit to Stand  - 2 x daily - 7 x weekly - 3 sets - 10 reps  ASSESSMENT:  CLINICAL IMPRESSION: Patient's session limited due to increased fatigue and pain in LLE. Patient reports pain as 8/10 at least at start of session, reporting he almost did not come today because of pain level. Began session with STS for general strengthening. Challenged patient with decreasing UE use. Pt unable to complete next set due to LLE pain. Addressed lumbar mobility, specifically focused on opening posterior aspect of spinal column, for LE relief. Pt limited due to shoulder pain. Focused on seated LE strength remainder of session. Ended on NuStep for dynamic cool down. Session concluded early at pt request. Patient will benefit from continued skilled physical therapy in order to address functional mobility, gait, and pain in order to improve function and QOL.      Eval:  Patient is a 83 y.o. male who was seen today for physical therapy evaluation and treatment for M25.562,G89.29 (ICD-10-CM) - Chronic pain of left knee M48.061 (ICD-10-CM) - Degenerative lumbar spinal stenosis.  On this date, patient reports main concern being his side/flank pain bilaterally and LE weakness/fatigue with activity. Patient demonstrates decreased lumbar ROM (flexion and extension), decreased LE strength, impaired gait, decreased LE endurance, and difficulty with transfers, which are  limited mostly due to dizziness/room spinning sensation. Patient reports that motion doesn't typically cause an increase in side/flank pain, that pain is more constant, and he feels it more at night/end of day. Reports his MD prescribed a medication (unsure of pain medication) that took pain away completely but that he ran out. Patient and son educated that PT cannot prescribe or advise on medication but to contact primary or referring MD as symptoms may not only be musculoskeletal as patient also describes increased urge with urination. Patient given HEP to target lumbar ROM and LE strength to start PT. Patient will benefit from continued skilled physical therapy in order to address the above in order to improve function and quality of life.    OBJECTIVE IMPAIRMENTS: Abnormal gait, decreased activity tolerance, decreased balance, decreased cognition, decreased coordination, decreased endurance, decreased mobility, difficulty walking, decreased ROM, decreased strength, dizziness, impaired flexibility, improper body mechanics, postural dysfunction, and pain.   ACTIVITY LIMITATIONS: carrying, lifting, bending, standing, squatting, stairs, transfers, and bed mobility  PARTICIPATION LIMITATIONS: meal prep, cleaning, laundry, community activity, and yard work  PERSONAL FACTORS: Time since onset of injury/illness/exacerbation are also affecting patient's functional outcome.   REHAB POTENTIAL: Fair    CLINICAL DECISION MAKING: Evolving/moderate complexity  EVALUATION COMPLEXITY: Low  GOALS: Goals reviewed with patient? No  SHORT TERM GOALS: Target date: 10/03/23 Patient will be independent with performance of HEP to demonstrate adequate self management of symptoms.  Baseline:  Goal status: INITIAL  2.   Patient will report at least a 25% improvement with function and/or pain reduction overall since beginning PT. Baseline:  Goal status: INITIAL     LONG TERM GOALS: Target date:  10/13/23 Patient will improve LEFS score by 10% in order to demonstrate improved self-perceived disability and overall function while meeting MCID.  Baseline: Goal status: INITIAL   2.  Patient will improve  30 second chair stand  test by 2 STS  in order to demonstrate improved LE strength and endurance required for functional transfers. Baseline:  Goal status: INITIAL   3.  Patient will report decreased side/flank pain as 2/10 or lower at end of day following completion of daily tasks. Baseline:  Goal status: INITIAL   4.  Patient will report overall 50% improvement since beginning PT. Baseline:  Goal status: INITIAL  5.  Patient will improve 2 minute walk test by 20 ft in order to demonstrate improved gait speed and activity tolerance needed for community ambulation. Baseline:  Goal status: INITIAL   PLAN:  PT FREQUENCY: 2x/week  PT DURATION: 8 weeks  PLANNED INTERVENTIONS: 97110-Therapeutic exercises, 97530- Therapeutic activity, [id number]- Neuromuscular re-education, 97535- Self Care, [id number]- Manual therapy, [id number]- Gait training, [phone]- Electrical stimulation (manual), [id number]- Traction (mechanical), [phone] (1-2 muscles), [id number] (3+ muscles)- Dry Needling, Patient/Family education, Balance training, Stair training, Taping, Joint mobilization, Spinal mobilization, Cryotherapy, and Moist heat  PLAN FOR NEXT SESSION: Discussion on drinking more water  and more walking each day, cont LE strengthening, core strengthening, general stretching.  Nustep for dynamic warmup.   4:05 PM, 09/22/23 Rosaria Settler, PT, DPT [facility] [place]

## 2023-09-24 ENCOUNTER — Emergency Department (HOSPITAL_COMMUNITY)

## 2023-09-24 ENCOUNTER — Other Ambulatory Visit: Payer: Self-pay

## 2023-09-24 ENCOUNTER — Emergency Department (HOSPITAL_COMMUNITY)
Admission: EM | Admit: 2023-09-24 | Discharge: 2023-09-24 | Disposition: A | Discharge status: Home / Self Care | Attending: Emergency Medicine | Admitting: Emergency Medicine

## 2023-09-24 ENCOUNTER — Encounter (HOSPITAL_COMMUNITY): Payer: Self-pay | Admitting: *Deleted

## 2023-09-24 DIAGNOSIS — M8008XA Age-related osteoporosis with current pathological fracture, vertebra(e), initial encounter for fracture: Secondary | ICD-10-CM | POA: Diagnosis not present

## 2023-09-24 DIAGNOSIS — Z7982 Long term (current) use of aspirin: Secondary | ICD-10-CM | POA: Insufficient documentation

## 2023-09-24 DIAGNOSIS — M5431 Sciatica, right side: Secondary | ICD-10-CM | POA: Insufficient documentation

## 2023-09-24 DIAGNOSIS — I7143 Infrarenal abdominal aortic aneurysm, without rupture: Secondary | ICD-10-CM | POA: Diagnosis not present

## 2023-09-24 DIAGNOSIS — R109 Unspecified abdominal pain: Secondary | ICD-10-CM | POA: Diagnosis not present

## 2023-09-24 DIAGNOSIS — M5186 Other intervertebral disc disorders, lumbar region: Secondary | ICD-10-CM | POA: Diagnosis not present

## 2023-09-24 DIAGNOSIS — N281 Cyst of kidney, acquired: Secondary | ICD-10-CM | POA: Diagnosis not present

## 2023-09-24 DIAGNOSIS — K5730 Diverticulosis of large intestine without perforation or abscess without bleeding: Secondary | ICD-10-CM | POA: Diagnosis not present

## 2023-09-24 DIAGNOSIS — M48061 Spinal stenosis, lumbar region without neurogenic claudication: Secondary | ICD-10-CM | POA: Diagnosis not present

## 2023-09-24 DIAGNOSIS — M5440 Lumbago with sciatica, unspecified side: Secondary | ICD-10-CM | POA: Diagnosis not present

## 2023-09-24 DIAGNOSIS — M25552 Pain in left hip: Secondary | ICD-10-CM | POA: Diagnosis present

## 2023-09-24 DIAGNOSIS — M47816 Spondylosis without myelopathy or radiculopathy, lumbar region: Secondary | ICD-10-CM | POA: Diagnosis not present

## 2023-09-24 DIAGNOSIS — M5432 Sciatica, left side: Secondary | ICD-10-CM | POA: Insufficient documentation

## 2023-09-24 LAB — CBC WITH DIFFERENTIAL/PLATELET
Abs Immature Granulocytes: 0.03 K/uL (ref 0.00–0.07)
Basophils Absolute: 0.1 K/uL (ref 0.0–0.1)
Basophils Relative: 2 %
Eosinophils Absolute: 0.4 K/uL (ref 0.0–0.5)
Eosinophils Relative: 7 %
HCT: 38.2 % — ABNORMAL LOW (ref 39.0–52.0)
Hemoglobin: 12.4 g/dL — ABNORMAL LOW (ref 13.0–17.0)
Immature Granulocytes: 1 %
Lymphocytes Relative: 21 %
Lymphs Abs: 1.2 K/uL (ref 0.7–4.0)
MCH: 31.1 pg (ref 26.0–34.0)
MCHC: 32.5 g/dL (ref 30.0–36.0)
MCV: 95.7 fL (ref 80.0–100.0)
Monocytes Absolute: 0.7 K/uL (ref 0.1–1.0)
Monocytes Relative: 12 %
Neutro Abs: 3.1 K/uL (ref 1.7–7.7)
Neutrophils Relative %: 57 %
Platelets: 159 K/uL (ref 150–400)
RBC: 3.99 MIL/uL — ABNORMAL LOW (ref 4.22–5.81)
RDW: 15.2 % (ref 11.5–15.5)
WBC: 5.5 K/uL (ref 4.0–10.5)
nRBC: 0 % (ref 0.0–0.2)

## 2023-09-24 LAB — BASIC METABOLIC PANEL WITH GFR
Anion gap: 11 (ref 5–15)
BUN: 33 mg/dL — ABNORMAL HIGH (ref 8–23)
CO2: 21 mmol/L — ABNORMAL LOW (ref 22–32)
Calcium: 9.3 mg/dL (ref 8.9–10.3)
Chloride: 104 mmol/L (ref 98–111)
Creatinine, Ser: 1.23 mg/dL (ref 0.61–1.24)
GFR, Estimated: 59 mL/min — ABNORMAL LOW (ref >60)
Glucose, Bld: 104 mg/dL — ABNORMAL HIGH (ref 70–99)
Potassium: 4.4 mmol/L (ref 3.5–5.1)
Sodium: 136 mmol/L (ref 135–145)

## 2023-09-24 MED ORDER — HYDROCODONE-ACETAMINOPHEN 5-325 MG PO TABS
1.0000 | ORAL_TABLET | Freq: Once | ORAL | Status: AC
  Administered 2023-09-24: 1 via ORAL
  Filled 2023-09-24: qty 1

## 2023-09-24 MED ORDER — PREDNISONE 10 MG (21) PO TBPK
ORAL_TABLET | Freq: Every day | ORAL | 0 refills | Status: DC
Start: 2023-09-24 — End: 2023-10-05

## 2023-09-24 MED ORDER — IOHEXOL 350 MG/ML SOLN
100.0000 mL | Freq: Once | INTRAVENOUS | Status: AC | PRN
  Administered 2023-09-24: 100 mL via INTRAVENOUS

## 2023-09-24 MED ORDER — HYDROCODONE-ACETAMINOPHEN 5-325 MG PO TABS: 1.0000 | ORAL_TABLET | Freq: Four times a day (QID) | ORAL | 0 refills | Status: DC | PRN

## 2023-09-24 NOTE — Discharge Instructions (Addendum)
 Please use caution with the pain medicine as they can may make you nauseous dizzy and constipation is very common.  Please use a laxative or stool softener.  Drink plenty of fluids.  Follow-up with your orthopedic doctor and spine doctor.  Return if any worsening or concerning symptoms

## 2023-09-24 NOTE — ED Triage Notes (Signed)
 Pt bilateral hip pain that radiates down bilateral legs x 2 weeks, denies any injury.

## 2023-09-24 NOTE — ED Provider Notes (Signed)
 [place] EMERGENCY DEPARTMENT AT [facility] Provider Note   CSN: [id number] Arrival date & time: 09/24/23  1000     Patient presents with: Hip Pain   Alex Lara. is a 83 y.o. male.  He is here with a complaint of pain in both of his hips radiating down to his feet that began around 2 weeks ago.  No known trauma.  Does have some intermittent side abdominal pain. has a history of infrarenal abdominal aneurysm.  Denies numbness or weakness.  No urinary incontinence or dysuria.  No fever.  Has been using topical pain medicine over his legs without improvement.  Has taken tramadol  in the past for pain for his shoulder.   The history is provided by the patient and a relative.  Hip Pain This is a new problem. The current episode started more than 1 week ago. The problem occurs constantly. The problem has not changed since onset.Associated symptoms include abdominal pain. Pertinent negatives include no chest pain, no headaches and no shortness of breath. Nothing aggravates the symptoms. Nothing relieves the symptoms. Treatments tried: topical medication. The treatment provided no relief.       Prior to Admission medications   Medication Sig Start Date End Date Taking? Authorizing Provider  acetaminophen  (TYLENOL ) 500 MG tablet Take 500 mg by mouth every 6 (six) hours as needed.    [provider]  albuterol  (VENTOLIN  HFA) 108 (90 Base) MCG/ACT inhaler Inhale 2 puffs into the lungs every 4 (four) hours as needed for wheezing or shortness of breath. 05/30/21   [provider]  amoxicillin -clavulanate (AUGMENTIN ) 875-125 MG tablet Take 1 tablet by mouth every 12 (twelve) hours. 08/03/23   Towana Ozell BROCKS, MD  aspirin  EC 325 MG tablet Take 325 mg by mouth daily.    [provider]  atorvastatin  (LIPITOR) 40 MG tablet Take 1 tablet (40 mg total) by mouth daily at 6 PM. Patient taking differently: Take 40 mg by mouth at bedtime. 01/31/19   Ricky Fines,  MD  cholecalciferol (VITAMIN D3) 25 MCG (1000 UNIT) tablet Take 1,000 Units by mouth daily.    [provider]  clonazePAM  (KLONOPIN ) 0.5 MG tablet Take 0.5 mg by mouth 3 (three) times daily as needed for anxiety. 05/19/21   [provider]  dicyclomine  (BENTYL ) 20 MG tablet Take 1 every 8-12 hours as needed for abdominal discomfort 08/03/23   Suzette Pac, MD  furosemide (LASIX) 20 MG tablet Take 20 mg by mouth daily. 03/30/23   [provider]  gabapentin  (NEURONTIN ) 100 MG capsule Take 1 capsule (100 mg total) by mouth at bedtime. 03/13/23   Margrette Taft BRAVO, MD  hydrocortisone  cream 1 % Apply 1 Application topically 2 (two) times daily. Preparation H, sometimes uses three times per day    [provider]  Ibuprofen (ADVIL) 200 MG CAPS Take 1 capsule by mouth daily as needed.    [provider]  lidocaine  4 % Place 1 patch onto the skin daily. 03/09/23   Neysa Caron PARAS, DO  losartan (COZAAR) 50 MG tablet Take 50 mg by mouth daily. 04/20/22   [provider]  lubiprostone  (AMITIZA ) 8 MCG capsule Take 1 capsule (8 mcg total) by mouth 2 (two) times daily with a meal. 06/08/23   Shirlean Therisa ORN, NP  melatonin 5 MG TABS Take 20 mg by mouth at bedtime.    [provider]  pantoprazole  (PROTONIX ) 40 MG tablet TAKE 1 TABLET(40 MG) BY MOUTH DAILY  04/24/23   Shirlean Therisa ORN, NP  sertraline  (ZOLOFT ) 100 MG tablet SMARTSIG:1.0 Tablet(s) By Mouth Daily 12/08/22   [provider]  traMADol  (ULTRAM ) 50 MG tablet Take 50 mg by mouth 3 (three) times daily as needed. 02/11/23   [provider]    Allergies: Codeine    Review of Systems  Constitutional:  Negative for fever.  Respiratory:  Negative for shortness of breath.   Cardiovascular:  Negative for chest pain.  Gastrointestinal:  Positive for abdominal pain.  Musculoskeletal:  Positive for gait problem.  Neurological:  Negative for weakness, numbness and headaches.    Updated  Vital Signs BP (!) 158/79 (BP Location: Right Arm)   Pulse 64   Temp 97.8 F (36.6 C) (Oral)   Resp 20   Ht 6' (1.829 m)   Wt 77.1 kg   SpO2 97%   BMI 23.06 kg/m   Physical Exam Vitals and nursing note reviewed.  Constitutional:      General: He is not in acute distress.    Appearance: Normal appearance. He is well-developed.  HENT:     Head: Normocephalic and atraumatic.  Eyes:     Conjunctiva/sclera: Conjunctivae normal.  Cardiovascular:     Rate and Rhythm: Normal rate and regular rhythm.     Heart sounds: No murmur heard. Pulmonary:     Effort: Pulmonary effort is normal. No respiratory distress.     Breath sounds: Normal breath sounds.  Abdominal:     Palpations: Abdomen is soft.     Tenderness: There is no abdominal tenderness. There is no guarding or rebound.  Musculoskeletal:        General: No deformity.     Cervical back: Neck supple.  Skin:    General: Skin is warm and dry.     Capillary Refill: Capillary refill takes less than 2 seconds.  Neurological:     General: No focal deficit present.     Mental Status: He is alert.     Sensory: No sensory deficit.     Motor: No weakness.     (all labs ordered are listed, but only abnormal results are displayed) Labs Reviewed  BASIC METABOLIC PANEL WITH GFR - Abnormal; Notable for the following components:      Result Value   CO2 21 (*)    Glucose, Bld 104 (*)    BUN 33 (*)    GFR, Estimated 59 (*)    All other components within normal limits  CBC WITH DIFFERENTIAL/PLATELET - Abnormal; Notable for the following components:   RBC 3.99 (*)    Hemoglobin 12.4 (*)    HCT 38.2 (*)    All other components within normal limits  URINALYSIS, ROUTINE W REFLEX MICROSCOPIC    EKG: None  Radiology: CT L-SPINE NO CHARGE Result Date: 09/24/2023 CLINICAL DATA:  Back and bilateral hip pain for 2 weeks. EXAM: CT LUMBAR SPINE WITHOUT CONTRAST TECHNIQUE: Multidetector CT imaging of the lumbar spine was performed without  intravenous contrast administration. Multiplanar CT image reconstructions were also generated. RADIATION DOSE REDUCTION: This exam was performed according to the departmental dose-optimization program which includes automated exposure control, adjustment of the mA and/or kV according to patient size and/or use of iterative reconstruction technique. COMPARISON:  CT scan 03/09/2023 FINDINGS: Segmentation: There are five lumbar type vertebral bodies. The last full intervertebral disc space is labeled L5-S1. Small disc space noted at S1-2. Alignment: Mild left convex lumbar scoliosis but normal overall alignment in the sagittal plane. Vertebrae: Remote  compression fracture of T11. No acute lumbar spine fractures are identified. Stable osteoporosis and degenerative lumbar spondylosis with multilevel disc disease and facet disease. Paraspinal and other soft tissues: Advanced vascular disease noted with 3.7 cm infrarenal abdominal aortic aneurysm. No retroperitoneal adenopathy. No paraspinal hematoma. There is severe fatty atrophy of the left iliopsoas muscle. Chronic finding since 2020. Disc levels: No large disc protrusions are identified. 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 No significant foraminal stenosis. Right-sided laminectomy defect noted at L5-S1, likely surgical. No significant spinal or lateral recess stenosis at this level. No significant foraminal stenosis. IMPRESSION: 1. Remote compression fracture of T11. 2. No acute lumbar spine fractures. 3. Stable osteoporosis and degenerative lumbar spondylosis with multilevel disc disease and facet disease. 4. 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 5. Postoperative changes at L5-S1. 6. 3.7 cm infrarenal abdominal aortic aneurysm. Recommend follow-up ultrasound every 2 years. 7. 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 Chronic finding  since 2020. 8. Aortic atherosclerosis. Aortic Atherosclerosis (ICD10-I70.0). Electronically Signed   By: MYRTIS Stammer M.D.   On: 09/24/2023 13:40   CT Angio Abd/Pel W and/or Wo Contrast Result Date: 09/24/2023 CLINICAL DATA:  Severe abdominal pain.  Known aortic aneurysm. EXAM: CTA ABDOMEN AND PELVIS WITHOUT AND WITH CONTRAST TECHNIQUE: Multidetector CT imaging of the abdomen and pelvis was performed using the standard protocol during bolus administration of intravenous contrast. Multiplanar reconstructed images and MIPs were obtained and reviewed to evaluate the vascular anatomy. RADIATION DOSE REDUCTION: This exam was performed according to the departmental dose-optimization program which includes automated exposure control, adjustment of the mA and/or kV according to patient size and/or use of iterative reconstruction technique. CONTRAST:  OMNIPAQUE  IOHEXOL  350 MG/ML SOLN COMPARISON:  None Available. FINDINGS: VASCULAR Aorta: Stable 3.7 cm infrarenal abdominal aortic aneurysm. Moderate mural thrombus is unchanged. No evidence of aneurysm leak or rupture. Celiac: Stable advanced atherosclerotic change without significant stenosis or dissection. SMA: Stable advanced atherosclerotic change with areas of significant stenosis but no occlusion, aneurysm or dissection. The branch vessels are patent. Renals: Stable atherosclerotic calcifications. Persistent significant proximal stenosis bilaterally. IMA: Diminutive vessel. Inflow: Advanced atherosclerotic calcifications but no occlusion, aneurysm or dissection. Proximal Outflow: Advanced atherosclerotic calcifications but no occlusion or aneurysm. Veins: No significant findings. Review of the MIP images confirms the above findings. NON-VASCULAR Lower chest: Stable tortuosity, ectasia and calcification of the lower thoracic aorta but no the stack shin. No pleural or pericardial effusion. No worrisome pulmonary process. Hepatobiliary: No focal liver abnormality  is seen. No gallstones, gallbladder wall thickening, or biliary dilatation. Pancreas: Unremarkable. No pancreatic ductal dilatation or surrounding inflammatory changes. Spleen: Normal in size without focal abnormality. Adrenals/Urinary Tract: Stable nodularity of the adrenal glands but no worrisome adrenal lesions. Stable renal cysts not requiring any further imaging evaluation or. No worrisome renal lesions or hydronephrosis. Stomach/Bowel: The stomach, duodenum, small bowel and colon are grossly normal without oral contrast. No inflammatory changes, mass lesions or obstructive findings. Stable colonic diverticulosis. Lymphatic: No abdominal or pelvic lymphadenopathy. Reproductive: The prostate gland is unremarkable. History of prostate gland Sir with small clips noted. The seminal vesicles are unremarkable. Other: No pelvic mass or adenopathy. No free pelvic fluid collections. No inguinal mass or adenopathy. No abdominal wall hernia or subcutaneous lesions. Musculoskeletal: No significant bony findings. Stable degenerative changes involving the lumbar spine and hips. Stable left hip hardware. Stable T11 compression deformity. IMPRESSION: 1. Stable  3.7 cm infrarenal abdominal aortic aneurysm. No evidence of aneurysm leak or rupture. 2. Stable advanced atherosclerotic calcifications involving the abdominal aorta and branch vessels. 3. No acute abdominal/pelvic findings, mass lesions or adenopathy. 4. Stable colonic diverticulosis. 5. Stable T11 compression deformity. 6. Aortic atherosclerosis. Aortic Atherosclerosis (ICD10-I70.0). Electronically Signed   By: MYRTIS Stammer M.D.   On: 09/24/2023 13:32     Procedures   Medications Ordered in the ED  HYDROcodone -acetaminophen  (NORCO/VICODIN) 5-325 MG per tablet 1 tablet (has no administration in time range)    Clinical Course as of 09/24/23 1741  Sun Sep 24, 2023  1442 Reviewed results of workup with patient and his son.  He says they follow-up with Dr.  Margrette.  They are comfortable plan for discharge on pain medicine and steroids.  Close follow-up with orthopedics and spine.  Return instructions discussed [MB]    Clinical Course User Index [MB] Towana Ozell BROCKS, MD                                 Medical Decision Making Amount and/or Complexity of Data Reviewed Labs: ordered. Radiology: ordered.  Risk Prescription drug management.   This patient complains of sciatica, pain in both of his hips radiating down to his toes; this involves an extensive number of treatment Options and is a complaint that carries with it a high risk of complications and morbidity. The differential includes sciatica, claudication, obstruction, vascular  I ordered, reviewed and interpreted labs, which included CBC with chronically low hemoglobin, chemistries with low bicarb, urinalysis ordered not obtained I ordered medication oral pain medicine and reviewed PMP when indicated. I ordered imaging studies which included CT angio abdomen along with CT lumbar spine and I independently    visualized and interpreted imaging which showed stable aneurysm no acute findings, significant spinal stenosis, chronic T11 compression Additional history obtained from patient's family members Previous records obtained and reviewed in epic including prior orthopedics and ED notes Social determinants considered, tobacco use Critical Interventions: None  After the interventions stated above, I reevaluated the patient and found patient to be feeling better and asking to be discharged Admission and further testing considered, no indications for admission.  Will cover with pain medicine and steroids and recommended close follow-up with his orthopedic team.  Return instructions discussed      Final diagnoses:  Bilateral sciatica  Infrarenal abdominal aortic aneurysm (AAA) without rupture [facility])    ED Discharge Orders          Ordered    HYDROcodone -acetaminophen   (NORCO/VICODIN) 5-325 MG tablet  Every 6 hours PRN        09/24/23 1444    predniSONE  (STERAPRED UNI-PAK 21 TAB) 10 MG (21) TBPK tablet  Daily        09/24/23 1444               Towana Ozell BROCKS, MD 09/24/23 1743

## 2023-09-26 ENCOUNTER — Ambulatory Visit (HOSPITAL_COMMUNITY): Admitting: Physical Therapy

## 2023-09-27 ENCOUNTER — Telehealth (HOSPITAL_COMMUNITY): Payer: Self-pay | Admitting: Physical Therapy

## 2023-09-27 NOTE — Telephone Encounter (Signed)
 Pt did not show for scheduled appt on 9/16.  Called and left VM regarding NS and policy; reminder for next appt this week.  Greig KATHEE Fuse, PTA/CLT [facility] Ph: [phone]

## 2023-09-28 ENCOUNTER — Encounter (HOSPITAL_COMMUNITY)

## 2023-10-02 ENCOUNTER — Telehealth (HOSPITAL_COMMUNITY): Payer: Self-pay

## 2023-10-02 ENCOUNTER — Encounter (HOSPITAL_COMMUNITY)

## 2023-10-02 NOTE — Telephone Encounter (Signed)
 Will count as No Show #1 due to pt missing another appointment previously but did have a recent ER visit. Pt missed a second appointment following that but PT neglected to call following.  Called pt concerning missed appointment this afternoon. No answer. Left message with reminder of next scheduled appointment and No Show policy from here.  3:45 PM, 10/02/23 Rosaria Settler, PT, DPT [facility] [place]

## 2023-10-05 ENCOUNTER — Emergency Department (HOSPITAL_COMMUNITY)
Admission: EM | Admit: 2023-10-05 | Discharge: 2023-10-05 | Disposition: A | Discharge status: Home / Self Care | Attending: Emergency Medicine | Admitting: Emergency Medicine

## 2023-10-05 ENCOUNTER — Telehealth (HOSPITAL_COMMUNITY): Payer: Self-pay

## 2023-10-05 ENCOUNTER — Emergency Department (HOSPITAL_COMMUNITY)

## 2023-10-05 ENCOUNTER — Ambulatory Visit: Admitting: Orthopedic Surgery

## 2023-10-05 ENCOUNTER — Encounter (HOSPITAL_COMMUNITY)

## 2023-10-05 ENCOUNTER — Other Ambulatory Visit: Payer: Self-pay

## 2023-10-05 ENCOUNTER — Encounter (HOSPITAL_COMMUNITY): Payer: Self-pay

## 2023-10-05 DIAGNOSIS — R471 Dysarthria and anarthria: Secondary | ICD-10-CM | POA: Diagnosis not present

## 2023-10-05 DIAGNOSIS — R059 Cough, unspecified: Secondary | ICD-10-CM | POA: Diagnosis not present

## 2023-10-05 DIAGNOSIS — R5383 Other fatigue: Secondary | ICD-10-CM | POA: Diagnosis not present

## 2023-10-05 DIAGNOSIS — R1030 Lower abdominal pain, unspecified: Secondary | ICD-10-CM | POA: Diagnosis not present

## 2023-10-05 DIAGNOSIS — R609 Edema, unspecified: Secondary | ICD-10-CM | POA: Insufficient documentation

## 2023-10-05 DIAGNOSIS — R5381 Other malaise: Secondary | ICD-10-CM | POA: Diagnosis not present

## 2023-10-05 DIAGNOSIS — R410 Disorientation, unspecified: Secondary | ICD-10-CM | POA: Diagnosis not present

## 2023-10-05 DIAGNOSIS — Z7982 Long term (current) use of aspirin: Secondary | ICD-10-CM | POA: Insufficient documentation

## 2023-10-05 DIAGNOSIS — R4182 Altered mental status, unspecified: Secondary | ICD-10-CM | POA: Insufficient documentation

## 2023-10-05 DIAGNOSIS — I700 Atherosclerosis of aorta: Secondary | ICD-10-CM | POA: Diagnosis not present

## 2023-10-05 DIAGNOSIS — J811 Chronic pulmonary edema: Secondary | ICD-10-CM | POA: Diagnosis not present

## 2023-10-05 LAB — URINALYSIS, W/ REFLEX TO CULTURE (INFECTION SUSPECTED)
Bilirubin Urine: NEGATIVE
Glucose, UA: NEGATIVE mg/dL
Hgb urine dipstick: NEGATIVE
Ketones, ur: NEGATIVE mg/dL
Leukocytes,Ua: NEGATIVE
Nitrite: NEGATIVE
Protein, ur: NEGATIVE mg/dL
Specific Gravity, Urine: 1.015 (ref 1.005–1.030)
pH: 5 (ref 5.0–8.0)

## 2023-10-05 LAB — COMPREHENSIVE METABOLIC PANEL WITH GFR
ALT: 21 U/L (ref 0–44)
AST: 49 U/L — ABNORMAL HIGH (ref 15–41)
Albumin: 4.2 g/dL (ref 3.5–5.0)
Alkaline Phosphatase: 63 U/L (ref 38–126)
Anion gap: 10 (ref 5–15)
BUN: 26 mg/dL — ABNORMAL HIGH (ref 8–23)
CO2: 22 mmol/L (ref 22–32)
Calcium: 9.1 mg/dL (ref 8.9–10.3)
Chloride: 103 mmol/L (ref 98–111)
Creatinine, Ser: 1.16 mg/dL (ref 0.61–1.24)
GFR, Estimated: >60 mL/min (ref >60)
Glucose, Bld: 106 mg/dL — ABNORMAL HIGH (ref 70–99)
Potassium: 3.8 mmol/L (ref 3.5–5.1)
Sodium: 135 mmol/L (ref 135–145)
Total Bilirubin: 0.8 mg/dL (ref 0.0–1.2)
Total Protein: 7.6 g/dL (ref 6.5–8.1)

## 2023-10-05 LAB — CBC WITH DIFFERENTIAL/PLATELET
Abs Immature Granulocytes: 0.03 K/uL (ref 0.00–0.07)
Basophils Absolute: 0 K/uL (ref 0.0–0.1)
Basophils Relative: 1 %
Eosinophils Absolute: 0.4 K/uL (ref 0.0–0.5)
Eosinophils Relative: 7 %
HCT: 38.8 % — ABNORMAL LOW (ref 39.0–52.0)
Hemoglobin: 12.9 g/dL — ABNORMAL LOW (ref 13.0–17.0)
Immature Granulocytes: 1 %
Lymphocytes Relative: 10 %
Lymphs Abs: 0.5 K/uL — ABNORMAL LOW (ref 0.7–4.0)
MCH: 31.6 pg (ref 26.0–34.0)
MCHC: 33.2 g/dL (ref 30.0–36.0)
MCV: 95.1 fL (ref 80.0–100.0)
Monocytes Absolute: 0.7 K/uL (ref 0.1–1.0)
Monocytes Relative: 14 %
Neutro Abs: 3.7 K/uL (ref 1.7–7.7)
Neutrophils Relative %: 67 %
Platelets: 144 K/uL — ABNORMAL LOW (ref 150–400)
RBC: 4.08 MIL/uL — ABNORMAL LOW (ref 4.22–5.81)
RDW: 15.4 % (ref 11.5–15.5)
WBC: 5.3 K/uL (ref 4.0–10.5)
nRBC: 0 % (ref 0.0–0.2)

## 2023-10-05 LAB — RESP PANEL BY RT-PCR (RSV, FLU A&B, COVID)  RVPGX2
Influenza A by PCR: NEGATIVE
Influenza B by PCR: NEGATIVE
Resp Syncytial Virus by PCR: NEGATIVE
SARS Coronavirus 2 by RT PCR: NEGATIVE

## 2023-10-05 MED ORDER — SODIUM CHLORIDE 0.9 % IV BOLUS
500.0000 mL | Freq: Once | INTRAVENOUS | Status: AC
  Administered 2023-10-05: 500 mL via INTRAVENOUS

## 2023-10-05 NOTE — Discharge Instructions (Signed)
 Your workup was reassuring, however I cannot completely rule out a stroke.  You are going home and can follow-up with your primary care doctor.  You and your son were informed of the risks.

## 2023-10-05 NOTE — ED Triage Notes (Signed)
 Pt arrived via POV with his son who reports Pt began showing signs of possible stroke yesterday. Pt presents with confusion, delayed and slowed verbal response. Pt reports previous stroke.

## 2023-10-05 NOTE — Telephone Encounter (Signed)
 Called concerning missed appointment this date. Of note, patient presented to [place] ER for AMS.  Spoke to patients wife who confirms. When asked about recent No Shows and if pt wanted to continue PT, wife reports she is unaware. Will cancel all other appointments besides next appointment at wife's request, due to clinic policy and recent change in medical status. Will follow up with hospital course and outpatient PT needs   2:17 PM, 10/05/23 Rosaria Settler, PT, DPT [facility] [place]

## 2023-10-05 NOTE — ED Notes (Signed)
 Walked with pt to bathroom for urination, however pt was unable to provide any urine. Placed urinal on bed rail and encouraged pt to let me know when he needs to go or when he has used the urinal for collection.

## 2023-10-05 NOTE — ED Provider Notes (Signed)
 [place] EMERGENCY DEPARTMENT AT [facility] Provider Note   CSN: [id number] Arrival date & time: 10/05/23  9064     Patient presents with: Altered Mental Status   Alex Lara. is a 83 y.o. male.    Altered Mental Status Patient presents mental status change..  Reportedly has been more confused over the last couple weeks.  Some difficulty speaking.  Delayed response at times.  Has been more fatigued and not wanting to do the same activities.  Reportedly had some difficulty speaking with previous stroke.     Prior to Admission medications   Medication Sig Start Date End Date Taking? Authorizing Provider  acetaminophen  (TYLENOL ) 500 MG tablet Take 500 mg by mouth every 6 (six) hours as needed.   Yes [provider]  albuterol  (VENTOLIN  HFA) 108 (90 Base) MCG/ACT inhaler Inhale 2 puffs into the lungs every 4 (four) hours as needed for wheezing or shortness of breath. 05/30/21  Yes [provider]  aspirin  EC 325 MG tablet Take 325 mg by mouth daily.   Yes [provider]  atorvastatin  (LIPITOR) 40 MG tablet Take 1 tablet (40 mg total) by mouth daily at 6 PM. Patient taking differently: Take 40 mg by mouth at bedtime. 01/31/19  Yes Ricky Fines, MD  clonazePAM  (KLONOPIN ) 0.5 MG tablet Take 0.5 mg by mouth 3 (three) times daily as needed for anxiety. 05/19/21  Yes [provider]  gabapentin  (NEURONTIN ) 100 MG capsule Take 1 capsule (100 mg total) by mouth at bedtime. 03/13/23  Yes Margrette Taft BRAVO, MD  Ibuprofen (ADVIL) 200 MG CAPS Take 1 capsule by mouth daily as needed.   Yes [provider]  ipratropium (ATROVENT) 0.06 % nasal spray Place into both nostrils. 10/03/23  Yes [provider]  losartan (COZAAR) 50 MG tablet Take 50 mg by mouth daily. 04/20/22  Yes [provider]  lubiprostone  (AMITIZA ) 8 MCG capsule Take 1 capsule (8 mcg total) by mouth 2 (two) times daily with a meal. 06/08/23  Yes Shirlean Therisa ORN, NP  melatonin 5 MG TABS Take 20 mg by mouth at bedtime.   Yes [provider]  pantoprazole  (PROTONIX ) 40 MG tablet TAKE 1 TABLET(40 MG) BY MOUTH DAILY 04/24/23  Yes Shirlean Therisa ORN, NP  predniSONE  (STERAPRED UNI-PAK 48 TAB) 10 MG (48) TBPK tablet Take by mouth daily. 09/25/23  Yes [provider]  sertraline  (ZOLOFT ) 100 MG tablet SMARTSIG:1.0 Tablet(s) By Mouth Daily 12/08/22  Yes [provider]  sertraline  (ZOLOFT ) 50 MG tablet Take 50 mg by mouth daily. 09/28/23  Yes [provider]  traMADol  (ULTRAM ) 50 MG tablet Take 50 mg by mouth 3 (three) times daily as needed. 02/11/23  Yes [provider]  furosemide (LASIX) 20 MG tablet Take 20 mg by mouth daily. Patient not taking: Reported on 10/05/2023 03/30/23   [provider]  HYDROcodone -acetaminophen  (NORCO/VICODIN) 5-325 MG tablet Take 1 tablet by mouth every 6 (six) hours as needed. 09/24/23   Towana Ozell BROCKS, MD    Allergies: Codeine    Review of Systems  Updated Vital Signs BP (!) 143/84   Pulse 65   Temp 97.9 F (36.6 C) (Oral)   Resp 19   Ht 6' (1.829 m)   Wt 77.1 kg   SpO2 97%   BMI 23.06 kg/m   Physical Exam Vitals and nursing note reviewed.  HENT:     Head: Atraumatic.  Eyes:     Extraocular Movements: Extraocular movements  intact.  Cardiovascular:     Rate and Rhythm: Regular rhythm.  Pulmonary:     Breath sounds: No wheezing or rhonchi.  Abdominal:     Tenderness: There is abdominal tenderness.     Comments: Lower abdominal tenderness no rebound or guarding.  No hernia palpated.  Musculoskeletal:        General: No tenderness.  Neurological:     Mental Status: He is alert.     Comments: Mild dysarthria.  Moving all extremities equally.  Answers questions appropriately.  Face symmetric.     (all labs ordered are listed, but only abnormal results are displayed) Labs Reviewed  COMPREHENSIVE METABOLIC PANEL WITH GFR - Abnormal; Notable for the following  components:      Result Value   Glucose, Bld 106 (*)    BUN 26 (*)    AST 49 (*)    All other components within normal limits  CBC WITH DIFFERENTIAL/PLATELET - Abnormal; Notable for the following components:   RBC 4.08 (*)    Hemoglobin 12.9 (*)    HCT 38.8 (*)    Platelets 144 (*)    Lymphs Abs 0.5 (*)    All other components within normal limits  URINALYSIS, W/ REFLEX TO CULTURE (INFECTION SUSPECTED) - Abnormal; Notable for the following components:   Bacteria, UA RARE (*)    All other components within normal limits  RESP PANEL BY RT-PCR (RSV, FLU A&B, COVID)  [id number]    EKG: None  Radiology: CT Head Wo Contrast Result Date: 10/05/2023 CLINICAL DATA:  Confusion with slower verbal response since yesterday. Possible stroke. History of previous stroke. EXAM: CT HEAD WITHOUT CONTRAST TECHNIQUE: Contiguous axial images were obtained from the base of the skull through the vertex without intravenous contrast. RADIATION DOSE REDUCTION: This exam was performed according to the departmental dose-optimization program which includes automated exposure control, adjustment of the mA and/or kV according to patient size and/or use of iterative reconstruction technique. COMPARISON:  01/30/2019 FINDINGS: Brain: Examination demonstrates the ventricles and cisterns to be within normal. Prominent CSF spaces compatible with age related atrophic change. There is mild chronic ischemic microvascular disease present. Old bilateral basal ganglia lacunar infarcts. Old small high left frontal parietal infarct. Old left posterior watershed infarct. No mass, mass effect, shift of midline structures or acute hemorrhage. No evidence to suggest acute infarction. Vascular: No hyperdense vessel or unexpected calcification. Skull: Normal. Negative for fracture or focal lesion. Sinuses/Orbits: No acute finding. Other: None. IMPRESSION: 1. No acute findings. 2. 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 Old small high left frontal parietal infarct. Old left posterior watershed infarct. Electronically Signed   By: Toribio Agreste M.D.   On: 10/05/2023 12:24   DG Chest Portable 1 View Result Date: 10/05/2023 EXAM: 1 VIEW(S) XRAY OF THE CHEST 10/05/2023 10:51:00 AM COMPARISON: 03/17/2023 CLINICAL HISTORY: cough. Pt arrived via POV with his son who reports Pt began showing signs of possible stroke yesterday. Pt presents with confusion, delayed and slowed verbal response. Pt reports previous stroke. Hx of hypertension, prostate cancer. Current everyday smoker FINDINGS: LUNGS AND PLEURA: No focal pulmonary opacity. Mild increase interstitial markings identified concerning for mild interstitial edema. No pleural effusion. No pneumothorax. HEART AND MEDIASTINUM: Aortic atherosclerotic calcification. No acute abnormality of the cardiac and mediastinal silhouettes. BONES AND SOFT TISSUES: No acute osseous abnormality. IMPRESSION: 1. Mild interstitial edema. 2. Aortic atherosclerotic calcification. Electronically signed by: Waddell Calk MD 10/05/2023 11:56 AM EDT  RP Workstation: [id number]     Procedures   Medications Ordered in the ED  sodium chloride  0.9 % bolus 500 mL (0 mLs Intravenous Stopped 10/05/23 1337)                                    Medical Decision Making Amount and/or Complexity of Data Reviewed Labs: ordered. Radiology: ordered.   Patient with some dysarthria and fatigue.  Differential diagnoses longed but include causes such as stroke, UTI, dehydration.  Does have some abdominal tenderness and has had recent scan for same that was reassuring.  Will get head CT chest x-ray basic blood work and urinalysis.   Workup reassuring here.  Potential mild edema on chest x-ray.  Blood work reassuring.  Urine does not show infection.  Head CT shows old strokes.  Patient not willing to wait for MRI to evaluate for a new stroke.  Discussed with  patient and son.  Appears to have capacity make this decision.  Will discharge home and can follow-up with PCP short-term.    Final diagnoses:  Dysarthria  Fatigue, unspecified type    ED Discharge Orders     None          Patsey Lot, MD 10/05/23 1529

## 2023-10-09 ENCOUNTER — Other Ambulatory Visit (HOSPITAL_COMMUNITY): Payer: Self-pay | Admitting: Internal Medicine

## 2023-10-09 DIAGNOSIS — Z8673 Personal history of transient ischemic attack (TIA), and cerebral infarction without residual deficits: Secondary | ICD-10-CM

## 2023-10-10 ENCOUNTER — Encounter (HOSPITAL_COMMUNITY)

## 2023-10-10 NOTE — Therapy (Signed)
 PHYSICAL THERAPY DISCHARGE SUMMARY  Visits from Start of Care: 3  Current functional level related to goals / functional outcomes: See last visit on 09/22/23   Remaining deficits: See last visit on 09/22/23   Education / Equipment: HEP   Patient agrees to discharge. Patient goals were not met. Patient is being discharged due to not returning since the last visit. Pt discharged via No Show Policy and recent hospitalizations/change in status.   3:19 PM, 10/10/23 Rosaria Settler, PT, DPT [facility] [place]

## 2023-10-12 ENCOUNTER — Ambulatory Visit: Admitting: Orthopedic Surgery

## 2023-10-13 ENCOUNTER — Encounter (HOSPITAL_COMMUNITY)

## 2023-10-17 ENCOUNTER — Emergency Department (HOSPITAL_COMMUNITY)
Admission: EM | Admit: 2023-10-17 | Discharge: 2023-10-17 | Disposition: A | Discharge status: Home / Self Care | Attending: Emergency Medicine | Admitting: Emergency Medicine

## 2023-10-17 ENCOUNTER — Encounter (HOSPITAL_COMMUNITY): Payer: Self-pay

## 2023-10-17 ENCOUNTER — Ambulatory Visit (HOSPITAL_COMMUNITY)

## 2023-10-17 ENCOUNTER — Emergency Department (EMERGENCY_DEPARTMENT_HOSPITAL)

## 2023-10-17 ENCOUNTER — Other Ambulatory Visit: Payer: Self-pay

## 2023-10-17 DIAGNOSIS — I444 Left anterior fascicular block: Secondary | ICD-10-CM | POA: Diagnosis not present

## 2023-10-17 DIAGNOSIS — Z7982 Long term (current) use of aspirin: Secondary | ICD-10-CM | POA: Diagnosis not present

## 2023-10-17 DIAGNOSIS — R531 Weakness: Secondary | ICD-10-CM | POA: Diagnosis not present

## 2023-10-17 DIAGNOSIS — I6621 Occlusion and stenosis of right posterior cerebral artery: Secondary | ICD-10-CM | POA: Diagnosis not present

## 2023-10-17 DIAGNOSIS — R410 Disorientation, unspecified: Secondary | ICD-10-CM | POA: Diagnosis not present

## 2023-10-17 DIAGNOSIS — R4182 Altered mental status, unspecified: Secondary | ICD-10-CM | POA: Diagnosis present

## 2023-10-17 DIAGNOSIS — Z8673 Personal history of transient ischemic attack (TIA), and cerebral infarction without residual deficits: Secondary | ICD-10-CM | POA: Insufficient documentation

## 2023-10-17 DIAGNOSIS — I10 Essential (primary) hypertension: Secondary | ICD-10-CM | POA: Diagnosis not present

## 2023-10-17 LAB — COMPREHENSIVE METABOLIC PANEL WITH GFR
ALT: 22 U/L (ref 0–44)
AST: 65 U/L — ABNORMAL HIGH (ref 15–41)
Albumin: 4.5 g/dL (ref 3.5–5.0)
Alkaline Phosphatase: 70 U/L (ref 38–126)
Anion gap: 13 (ref 5–15)
BUN: 26 mg/dL — ABNORMAL HIGH (ref 8–23)
CO2: 21 mmol/L — ABNORMAL LOW (ref 22–32)
Calcium: 9.2 mg/dL (ref 8.9–10.3)
Chloride: 105 mmol/L (ref 98–111)
Creatinine, Ser: 1.17 mg/dL (ref 0.61–1.24)
GFR, Estimated: >60 mL/min (ref >60)
Glucose, Bld: 109 mg/dL — ABNORMAL HIGH (ref 70–99)
Potassium: 3.6 mmol/L (ref 3.5–5.1)
Sodium: 139 mmol/L (ref 135–145)
Total Bilirubin: 0.4 mg/dL (ref 0.0–1.2)
Total Protein: 7 g/dL (ref 6.5–8.1)

## 2023-10-17 LAB — URINALYSIS, ROUTINE W REFLEX MICROSCOPIC
Bilirubin Urine: NEGATIVE
Glucose, UA: NEGATIVE mg/dL
Hgb urine dipstick: NEGATIVE
Ketones, ur: NEGATIVE mg/dL
Leukocytes,Ua: NEGATIVE
Nitrite: NEGATIVE
Protein, ur: NEGATIVE mg/dL
Specific Gravity, Urine: 1.015 (ref 1.005–1.030)
pH: 5 (ref 5.0–8.0)

## 2023-10-17 LAB — CBC
HCT: 36 % — ABNORMAL LOW (ref 39.0–52.0)
Hemoglobin: 11.9 g/dL — ABNORMAL LOW (ref 13.0–17.0)
MCH: 31.1 pg (ref 26.0–34.0)
MCHC: 33.1 g/dL (ref 30.0–36.0)
MCV: 94 fL (ref 80.0–100.0)
Platelets: 146 K/uL — ABNORMAL LOW (ref 150–400)
RBC: 3.83 MIL/uL — ABNORMAL LOW (ref 4.22–5.81)
RDW: 15.4 % (ref 11.5–15.5)
WBC: 5.6 K/uL (ref 4.0–10.5)
nRBC: 0 % (ref 0.0–0.2)

## 2023-10-17 LAB — CBG MONITORING, ED: Glucose-Capillary: 110 mg/dL — ABNORMAL HIGH (ref 70–99)

## 2023-10-17 NOTE — ED Triage Notes (Signed)
 Pt arrived via POV with son who reports pt seemed altered last night around 2215 and this morning delaying in what he has to say. Pt has hx of strokes.

## 2023-10-17 NOTE — ED Provider Notes (Signed)
 [place] EMERGENCY DEPARTMENT AT [facility] Provider Note   CSN: [id number] Arrival date & time: 10/17/23  9097     Patient presents with: Altered Mental Status   Alex Lara. is a 83 y.o. male.  {Add pertinent medical, surgical, social history, OB history to YEP:67052} Patient has a history of stroke.  The patient thought maybe he had another stroke because he felt a little bit more weak and was slightly more confused   Altered Mental Status      Prior to Admission medications   Medication Sig Start Date End Date Taking? Authorizing Provider  acetaminophen  (TYLENOL ) 500 MG tablet Take 500 mg by mouth every 6 (six) hours as needed.   Yes [provider]  albuterol  (VENTOLIN  HFA) 108 (90 Base) MCG/ACT inhaler Inhale 2 puffs into the lungs every 4 (four) hours as needed for wheezing or shortness of breath. 05/30/21  Yes [provider]  aspirin  EC 325 MG tablet Take 325 mg by mouth daily.   Yes [provider]  atorvastatin  (LIPITOR) 40 MG tablet Take 1 tablet (40 mg total) by mouth daily at 6 PM. Patient taking differently: Take 40 mg by mouth at bedtime. 01/31/19  Yes Ricky Fines, MD  clonazePAM  (KLONOPIN ) 0.5 MG tablet Take 0.5 mg by mouth 3 (three) times daily as needed for anxiety. 05/19/21  Yes [provider]  gabapentin  (NEURONTIN ) 100 MG capsule Take 1 capsule (100 mg total) by mouth at bedtime. 03/13/23  Yes Margrette Taft BRAVO, MD  Ibuprofen (ADVIL) 200 MG CAPS Take 1 capsule by mouth daily as needed.   Yes [provider]  ipratropium (ATROVENT) 0.06 % nasal spray Place into both nostrils. 10/03/23  Yes [provider]  losartan (COZAAR) 50 MG tablet Take 50 mg by mouth daily. 04/20/22  Yes [provider]  lubiprostone  (AMITIZA ) 8 MCG capsule Take 1 capsule (8 mcg total) by mouth 2 (two) times daily with a meal. 06/08/23  Yes Shirlean Therisa ORN, NP  melatonin 5 MG TABS Take 20 mg by mouth at bedtime.    Yes [provider]  pantoprazole  (PROTONIX ) 40 MG tablet TAKE 1 TABLET(40 MG) BY MOUTH DAILY 04/24/23  Yes Shirlean Therisa ORN, NP  sertraline  (ZOLOFT ) 100 MG tablet SMARTSIG:1.0 Tablet(s) By Mouth Daily 12/08/22  Yes [provider]  sertraline  (ZOLOFT ) 50 MG tablet Take 50 mg by mouth daily. 09/28/23  Yes [provider]  traMADol  (ULTRAM ) 50 MG tablet Take 50 mg by mouth 3 (three) times daily as needed. 02/11/23  Yes [provider]  furosemide (LASIX) 20 MG tablet Take 20 mg by mouth daily. Patient not taking: Reported on 10/05/2023 03/30/23   [provider]    Allergies: Codeine    Review of Systems  Updated Vital Signs BP (!) 143/75   Pulse (!) 54   Temp 97.8 F (36.6 C) (Oral)   Resp 18   Ht 6' (1.829 m)   Wt 77.1 kg   SpO2 100%   BMI 23.06 kg/m   Physical Exam  (all labs ordered are listed, but only abnormal results are displayed) Labs Reviewed  COMPREHENSIVE METABOLIC PANEL WITH GFR - Abnormal; Notable for the following components:      Result Value   CO2 21 (*)    Glucose, Bld 109 (*)    BUN 26 (*)    AST 65 (*)    All other components within normal limits  CBC - Abnormal; Notable for the following  components:   RBC 3.83 (*)    Hemoglobin 11.9 (*)    HCT 36.0 (*)    Platelets 146 (*)    All other components within normal limits  CBG MONITORING, ED - Abnormal; Notable for the following components:   Glucose-Capillary 110 (*)    All other components within normal limits  URINALYSIS, ROUTINE W REFLEX MICROSCOPIC  CBG MONITORING, ED    EKG: EKG Interpretation Date/Time:  Tuesday October 17 2023 09:17:03 EDT Ventricular Rate:  59 PR Interval:  189 QRS Duration:  109 QT Interval:  415 QTC Calculation: 412 R Axis:   -54  Text Interpretation: Sinus rhythm Left anterior fascicular block Confirmed by Suzette Pac [phone]) on 10/17/2023 2:13:52 PM  Radiology: MR ANGIO HEAD WO CONTRAST Result Date: 10/17/2023 CLINICAL  DATA:  Altered mental status EXAM: MRA HEAD WITHOUT CONTRAST TECHNIQUE: Angiographic images of the Circle of Willis were acquired using MRA technique without intravenous contrast. COMPARISON:  None Available. FINDINGS: MRA intracranial: Right-side: The internal carotid artery is patent without significant stenosis. The middle cerebral artery is patent without significant stenosis or proximal branch occlusion. The anterior cerebral artery is patent without significant stenosis or proximal branch occlusion. No aneurysm. Left side: The internal carotid artery is patent without significant stenosis. The middle cerebral artery is patent without significant stenosis or proximal branch occlusion. The anterior cerebral artery is patent without significant stenosis or proximal branch occlusion. No aneurysm. Posterior circulation: Both vertebral arteries are patent. The left is dominant. There is no basilar stenosis. Both posterior cerebral arteries are patent. The right arises from the internal carotid artery. There is a severe stenosis of the right posterior cerebral artery. Other comments: None IMPRESSION: No proximal vessel occlusion. 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 Electronically Signed   By: Nancyann Burns M.D.   On: 10/17/2023 12:48   MR BRAIN WO CONTRAST Result Date: 10/17/2023 CLINICAL DATA:  Altered mental status EXAM: MRI HEAD WITHOUT CONTRAST TECHNIQUE: Multiplanar, multiecho pulse sequences of the brain and surrounding structures were obtained without intravenous contrast. COMPARISON:  CT 10/05/2023 FINDINGS: MRI brain: There is an old infarct in the left parietal lobe with encephalomalacia. There is an old lacunar infarct in the left frontal lobe. There is an old infarct in the left basal ganglia. There is an old lacunar infarct in the right side of the thalamus. No acute infarct. The ventricles are normal. No mass lesion. There are normal flow signals in the carotid arteries and basilar  artery. No significant bone marrow signal abnormality. No significant abnormality in the paranasal sinuses or soft tissues. IMPRESSION: Multiple old infarcts No acute infarct or other acute abnormality. Electronically Signed   By: Nancyann Burns M.D.   On: 10/17/2023 12:46    {Document cardiac monitor, telemetry assessment procedure when appropriate:32947} Procedures   Medications Ordered in the ED - No data to display    {Click here for ABCD2, HEART and other calculators REFRESH Note before signing:1}                              Medical Decision Making Amount and/or Complexity of Data Reviewed Labs: ordered. Radiology: ordered.  Patient with old strokes and confusion.  Confusion has improved.  He will follow-up with his PCP  {Document critical care time when appropriate  Document review of labs and clinical decision tools ie CHADS2VASC2, etc  Document your independent review of radiology images and any outside records  Document your discussion with family members, caretakers and with consultants  Document social determinants of health affecting pt's care  Document your decision making why or why not admission, treatments were needed:32947:::1}   Final diagnoses:  Confusion    ED Discharge Orders     None

## 2023-10-17 NOTE — Discharge Instructions (Signed)
Follow-up with your doctor in a week

## 2023-10-17 NOTE — ED Notes (Signed)
 Patient transported to MRI

## 2023-10-17 NOTE — ED Notes (Signed)
 ED Provider at bedside.

## 2023-10-23 ENCOUNTER — Encounter: Payer: Self-pay | Admitting: Emergency Medicine

## 2023-10-23 ENCOUNTER — Ambulatory Visit
Admission: EM | Admit: 2023-10-23 | Discharge: 2023-10-23 | Disposition: A | Discharge status: Home / Self Care | Attending: Family Medicine | Admitting: Family Medicine

## 2023-10-23 ENCOUNTER — Other Ambulatory Visit: Payer: Self-pay

## 2023-10-23 DIAGNOSIS — J22 Unspecified acute lower respiratory infection: Secondary | ICD-10-CM

## 2023-10-23 DIAGNOSIS — J441 Chronic obstructive pulmonary disease with (acute) exacerbation: Secondary | ICD-10-CM | POA: Diagnosis not present

## 2023-10-23 MED ORDER — AMOXICILLIN-POT CLAVULANATE 875-125 MG PO TABS: 1.0000 | ORAL_TABLET | Freq: Two times a day (BID) | ORAL | 0 refills | Status: DC

## 2023-10-23 MED ORDER — PROMETHAZINE-DM 6.25-15 MG/5ML PO SYRP: 5.0000 mL | ORAL_SOLUTION | Freq: Four times a day (QID) | ORAL | 0 refills | Status: AC | PRN

## 2023-10-23 MED ORDER — FLUTICASONE PROPIONATE HFA 110 MCG/ACT IN AERO: 2.0000 | INHALATION_SPRAY | Freq: Two times a day (BID) | RESPIRATORY_TRACT | 0 refills | Status: AC

## 2023-10-23 NOTE — ED Provider Notes (Signed)
 [facility]    CSN: [id number] Arrival date & time: 10/23/23  1057      History   Chief Complaint Chief Complaint  Patient presents with   Cough    HPI Alex Lara. is a 83 y.o. male.   Patient presenting today with over a month of productive hacking cough, wheezing, shortness of breath.  Denies fever, chills, chest pain, abdominal pain, vomiting, diarrhea.  History of COPD on albuterol  as needed, states his inhalers are only giving him very short periods of relief and having to use them much more frequently recently.  Otherwise not trying anything over-the-counter for symptoms.    Past Medical History:  Diagnosis Date   Anxiety and depression    Arteriosclerotic cardiovascular disease (ASCVD)    06/2008: DES to the RCA and LAD   Blood clotting disorder    Carpal tunnel syndrome    Chronic anticoagulation    Deep vein thrombosis (HCC)    Degenerative joint disease    Gastroesophageal reflux disease    Hyperlipidemia    Hypertension    Myocardial infarction (HCC)    1970   Prostate cancer (HCC)    Stroke (HCC)    Tobacco abuse     Patient Active Problem List   Diagnosis Date Noted   Constipation 11/01/2022   Dysphagia 11/01/2022   Prolapsed internal hemorrhoids, grade 3 05/26/2022   Grade II hemorrhoids 10/21/2021   Hemorrhoids 09/30/2021   Bradycardia 08/16/2021   Hypotension 08/16/2021   Aortic aneurysm 08/16/2021   AKI (acute kidney injury) 08/16/2021   Rectal pain 08/10/2021   Erectile dysfunction due to arterial insufficiency 09/13/2019   Weak urinary stream 09/13/2019   Benign prostatic hyperplasia with urinary obstruction    Depression with anxiety    Stroke-like symptoms 01/30/2019   Stroke (HCC) 01/30/2019   Malignant neoplasm of prostate (HCC) 04/22/2018   Chest tightness 05/04/2016   Dyspnea on exertion 05/04/2016   Coronary artery disease involving native coronary artery of native heart without angina pectoris     Hyperlipidemia    Tobacco abuse    Deep vein thrombosis (HCC)    Gastroesophageal reflux disease    Hypertension    Osteoarthritis 02/14/2006    Past Surgical History:  Procedure Laterality Date   BACK SURGERY  1988   CARDIAC CATHETERIZATION  2004   stents   CATARACT EXTRACTION W/PHACO Right 04/04/2022   Procedure: CATARACT EXTRACTION PHACO AND INTRAOCULAR LENS PLACEMENT (IOC);  Surgeon: Harrie Agent, MD;  Location: [facility];  Service: Ophthalmology;  Laterality: Right;  CDE: 10.63   COLONOSCOPY  ?  Date   COLONOSCOPY WITH PROPOFOL  N/A 07/14/2021   Procedure: COLONOSCOPY WITH PROPOFOL ;  Surgeon: Cindie Carlin POUR, DO;  Location: [facility];  Service: Endoscopy;  Laterality: N/A;  8:15am, asa 3   IM NAILING HUMERUS Left    INGUINAL HERNIA REPAIR     ORIF-unknown fracture     POLYPECTOMY  07/14/2021   Procedure: POLYPECTOMY;  Surgeon: Cindie Carlin POUR, DO;  Location: [facility];  Service: Endoscopy;;   PROSTATE BIOPSY     ROTATOR CUFF REPAIR         Home Medications    Prior to Admission medications   Medication Sig Start Date End Date Taking? Authorizing Provider  amoxicillin -clavulanate (AUGMENTIN ) 875-125 MG tablet Take 1 tablet by mouth every 12 (twelve) hours. 10/23/23  Yes Stuart Vernell Norris, PA-C  fluticasone (FLOVENT HFA) 110 MCG/ACT inhaler Inhale 2 puffs into the lungs 2 (  two) times daily. Rinse mouth with water  after each use 10/23/23  Yes Stuart Vernell Norris, PA-C  promethazine-dextromethorphan (PROMETHAZINE-DM) 6.25-15 MG/5ML syrup Take 5 mLs by mouth 4 (four) times daily as needed. 10/23/23  Yes Stuart Vernell Norris, PA-C  acetaminophen  (TYLENOL ) 500 MG tablet Take 500 mg by mouth every 6 (six) hours as needed.    [provider]  albuterol  (VENTOLIN  HFA) 108 (90 Base) MCG/ACT inhaler Inhale 2 puffs into the lungs every 4 (four) hours as needed for wheezing or shortness of breath. 05/30/21   [provider]  aspirin  EC 325 MG tablet  Take 325 mg by mouth daily.    [provider]  atorvastatin  (LIPITOR) 40 MG tablet Take 1 tablet (40 mg total) by mouth daily at 6 PM. Patient taking differently: Take 40 mg by mouth at bedtime. 01/31/19   Ricky Fines, MD  clonazePAM  (KLONOPIN ) 0.5 MG tablet Take 0.5 mg by mouth 3 (three) times daily as needed for anxiety. 05/19/21   [provider]  furosemide (LASIX) 20 MG tablet Take 20 mg by mouth daily. Patient not taking: Reported on 10/05/2023 03/30/23   [provider]  gabapentin  (NEURONTIN ) 100 MG capsule Take 1 capsule (100 mg total) by mouth at bedtime. 03/13/23   Harrison, Stanley E, MD  Ibuprofen (ADVIL) 200 MG CAPS Take 1 capsule by mouth daily as needed.    [provider]  ipratropium (ATROVENT) 0.06 % nasal spray Place into both nostrils. 10/03/23   [provider]  losartan (COZAAR) 50 MG tablet Take 50 mg by mouth daily. 04/20/22   [provider]  lubiprostone  (AMITIZA ) 8 MCG capsule Take 1 capsule (8 mcg total) by mouth 2 (two) times daily with a meal. 06/08/23   Shirlean Therisa ORN, NP  melatonin 5 MG TABS Take 20 mg by mouth at bedtime.    [provider]  pantoprazole  (PROTONIX ) 40 MG tablet TAKE 1 TABLET(40 MG) BY MOUTH DAILY 04/24/23   Shirlean Therisa ORN, NP  sertraline  (ZOLOFT ) 100 MG tablet SMARTSIG:1.0 Tablet(s) By Mouth Daily 12/08/22   [provider]  sertraline  (ZOLOFT ) 50 MG tablet Take 50 mg by mouth daily. 09/28/23   [provider]  traMADol  (ULTRAM ) 50 MG tablet Take 50 mg by mouth 3 (three) times daily as needed. 02/11/23   [provider]    Family History Family History  Problem Relation Age of Onset   Coronary artery disease Father    Coronary artery disease Mother        died post-CABG   Heart disease Mother    Breast cancer Maternal Aunt    Lung cancer Cousin    Melanoma Daughter    Prostate cancer Neg Hx     Social History Social History   Tobacco Use   Smoking  status: Every Day    Current packs/day: 1.50    Average packs/day: 1.5 packs/day for 55.0 years (82.5 ttl pk-yrs)    Types: Cigarettes    Passive exposure: Current   Smokeless tobacco: Never  Vaping Use   Vaping status: Never Used  Substance Use Topics   Alcohol use: Not Currently   Drug use: No     Allergies   Codeine   Review of Systems Review of Systems Per HPI  Physical Exam Triage Vital Signs ED Triage Vitals  Encounter Vitals Group     BP 10/23/23 1139 130/75     Girls Systolic BP Percentile --      Girls Diastolic BP  Percentile --      Boys Systolic BP Percentile --      Boys Diastolic BP Percentile --      Pulse Rate 10/23/23 1139 60     Resp 10/23/23 1139 20     Temp 10/23/23 1139 98.1 F (36.7 C)     Temp Source 10/23/23 1139 Oral     SpO2 10/23/23 1139 96 %     Weight --      Height --      Head Circumference --      Peak Flow --      Pain Score 10/23/23 1140 0     Pain Loc --      Pain Education --      Exclude from Growth Chart --    No data found.  Updated Vital Signs BP 130/75 (BP Location: Right Arm)   Pulse 60   Temp 98.1 F (36.7 C) (Oral)   Resp 20   SpO2 96%   Visual Acuity Right Eye Distance:   Left Eye Distance:   Bilateral Distance:    Right Eye Near:   Left Eye Near:    Bilateral Near:     Physical Exam Vitals and nursing note reviewed.  Constitutional:      Appearance: He is well-developed.  HENT:     Head: Atraumatic.     Right Ear: External ear normal.     Left Ear: External ear normal.     Nose: Congestion present.     Mouth/Throat:     Pharynx: No oropharyngeal exudate.  Eyes:     Conjunctiva/sclera: Conjunctivae normal.     Pupils: Pupils are equal, round, and reactive to light.  Cardiovascular:     Rate and Rhythm: Normal rate and regular rhythm.  Pulmonary:     Effort: Pulmonary effort is normal. No respiratory distress.     Breath sounds: Wheezing present. No rales.     Comments: Decreased breath  sounds throughout Musculoskeletal:        General: Normal range of motion.     Cervical back: Normal range of motion and neck supple.  Lymphadenopathy:     Cervical: No cervical adenopathy.  Skin:    General: Skin is warm and dry.  Neurological:     Mental Status: He is alert and oriented to person, place, and time.  Psychiatric:        Behavior: Behavior normal.      UC Treatments / Results  Labs (all labs ordered are listed, but only abnormal results are displayed) Labs Reviewed - No data to display  EKG   Radiology No results found.  Procedures Procedures (including critical care time)  Medications Ordered in UC Medications - No data to display  Initial Impression / Assessment and Plan / UC Course  I have reviewed the triage vital signs and the nursing notes.  Pertinent labs & imaging results that were available during my care of the patient were reviewed by me and considered in my medical decision making (see chart for details).     Vital signs within normal limits, overall well-appearing in no acute distress but given duration and worsening course of symptoms as well as history of COPD will treat with Augmentin , Phenergan DM and he declines oral prednisone  but agreeable to Flovent inhaler.  Continue albuterol  every 4 hours as needed.  Return for any worsening or unresolving symptoms  Final Clinical Impressions(s) / UC Diagnoses   Final diagnoses:  Lower respiratory infection  COPD exacerbation (HCC)     Discharge Instructions      Continue the albuterol  inhaler every 4 hours as needed and I have prescribed an antibiotic, a steroid inhaler and a cough syrup additionally.  Follow-up for worsening or unresolving symptoms    ED Prescriptions     Medication Sig Dispense Auth. Provider   fluticasone (FLOVENT HFA) 110 MCG/ACT inhaler Inhale 2 puffs into the lungs 2 (two) times daily. Rinse mouth with water  after each use 1 each Stuart Vernell Norris, PA-C    promethazine-dextromethorphan (PROMETHAZINE-DM) 6.25-15 MG/5ML syrup Take 5 mLs by mouth 4 (four) times daily as needed. 100 mL Stuart Vernell Norris, PA-C   amoxicillin -clavulanate (AUGMENTIN ) 875-125 MG tablet Take 1 tablet by mouth every 12 (twelve) hours. 14 tablet Stuart Vernell Norris, [place]      PDMP not reviewed this encounter.   Stuart Vernell Norris, [place] 10/23/23 1316

## 2023-10-23 NOTE — Discharge Instructions (Signed)
 Continue the albuterol  inhaler every 4 hours as needed and I have prescribed an antibiotic, a steroid inhaler and a cough syrup additionally.  Follow-up for worsening or unresolving symptoms

## 2023-10-23 NOTE — ED Triage Notes (Signed)
 Pt reports productive cough for over a month. Denies any known fever. NAD noted. Has tried breathing treatments, last being this am. Denies any improvement in cough with otc medication or breathing treatments.

## 2023-10-26 ENCOUNTER — Ambulatory Visit: Admitting: Gastroenterology

## 2023-10-26 ENCOUNTER — Ambulatory Visit: Admitting: Orthopedic Surgery

## 2023-10-30 ENCOUNTER — Ambulatory Visit: Admitting: Orthopedic Surgery

## 2023-10-30 DIAGNOSIS — M75122 Complete rotator cuff tear or rupture of left shoulder, not specified as traumatic: Secondary | ICD-10-CM

## 2023-10-30 DIAGNOSIS — M75111 Incomplete rotator cuff tear or rupture of right shoulder, not specified as traumatic: Secondary | ICD-10-CM | POA: Diagnosis not present

## 2023-10-30 MED ORDER — METHYLPREDNISOLONE ACETATE 40 MG/ML IJ SUSP
40.0000 mg | Freq: Once | INTRAMUSCULAR | Status: AC
  Administered 2023-10-30: 40 mg via INTRA_ARTICULAR

## 2023-10-30 NOTE — Progress Notes (Signed)
  Chief Complaint  Patient presents with   Injections    Both shoulders     Encounter Diagnoses  Name Primary?   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 After pinning the skin with alcohol and anesthetized the skin with ethyl chloride the subacromial space was injected using a 20-gauge needle. There were no complications  Sterile dressing was applied.    Procedure note the subacromial injection shoulder left   Verbal consent was obtained to inject the  Left   Shoulder  Timeout was completed to confirm the injection site is a subacromial space of the  left  shoulder  Medication used Depo-Medrol  40 mg and lidocaine  1% 3 cc  Anesthesia was provided by ethyl chloride  The injection was performed in the left  posterior subacromial space. After pinning the skin with alcohol and anesthetized the skin with ethyl chloride the subacromial space was injected using a 20-gauge needle. There were no complications  Sterile dressing was applied.

## 2023-11-02 ENCOUNTER — Encounter: Payer: Self-pay | Admitting: Pain Medicine

## 2023-11-02 ENCOUNTER — Ambulatory Visit: Attending: Pain Medicine | Admitting: Pain Medicine

## 2023-11-02 VITALS — Resp 18 | Ht 72.0 in | Wt 170.0 lb

## 2023-11-02 DIAGNOSIS — G4486 Cervicogenic headache: Secondary | ICD-10-CM | POA: Diagnosis not present

## 2023-11-02 DIAGNOSIS — M5417 Radiculopathy, lumbosacral region: Secondary | ICD-10-CM | POA: Diagnosis present

## 2023-11-02 DIAGNOSIS — M25519 Pain in unspecified shoulder: Secondary | ICD-10-CM | POA: Diagnosis present

## 2023-11-02 DIAGNOSIS — M79604 Pain in right leg: Secondary | ICD-10-CM | POA: Insufficient documentation

## 2023-11-02 DIAGNOSIS — M79605 Pain in left leg: Secondary | ICD-10-CM | POA: Insufficient documentation

## 2023-11-02 DIAGNOSIS — Z789 Other specified health status: Secondary | ICD-10-CM | POA: Insufficient documentation

## 2023-11-02 DIAGNOSIS — Z79899 Other long term (current) drug therapy: Secondary | ICD-10-CM | POA: Insufficient documentation

## 2023-11-02 DIAGNOSIS — M25562 Pain in left knee: Secondary | ICD-10-CM | POA: Diagnosis present

## 2023-11-02 DIAGNOSIS — M549 Dorsalgia, unspecified: Secondary | ICD-10-CM | POA: Diagnosis not present

## 2023-11-02 DIAGNOSIS — M5410 Radiculopathy, site unspecified: Secondary | ICD-10-CM | POA: Insufficient documentation

## 2023-11-02 DIAGNOSIS — M542 Cervicalgia: Secondary | ICD-10-CM | POA: Diagnosis not present

## 2023-11-02 DIAGNOSIS — G8929 Other chronic pain: Secondary | ICD-10-CM | POA: Insufficient documentation

## 2023-11-02 DIAGNOSIS — Z9889 Other specified postprocedural states: Secondary | ICD-10-CM | POA: Diagnosis present

## 2023-11-02 DIAGNOSIS — M25561 Pain in right knee: Secondary | ICD-10-CM | POA: Diagnosis present

## 2023-11-02 DIAGNOSIS — M25511 Pain in right shoulder: Secondary | ICD-10-CM | POA: Diagnosis not present

## 2023-11-02 DIAGNOSIS — M25512 Pain in left shoulder: Secondary | ICD-10-CM | POA: Insufficient documentation

## 2023-11-02 DIAGNOSIS — M961 Postlaminectomy syndrome, not elsewhere classified: Secondary | ICD-10-CM | POA: Insufficient documentation

## 2023-11-02 DIAGNOSIS — M899 Disorder of bone, unspecified: Secondary | ICD-10-CM | POA: Insufficient documentation

## 2023-11-02 DIAGNOSIS — R519 Headache, unspecified: Secondary | ICD-10-CM | POA: Diagnosis not present

## 2023-11-02 DIAGNOSIS — G894 Chronic pain syndrome: Secondary | ICD-10-CM | POA: Insufficient documentation

## 2023-11-02 DIAGNOSIS — M629 Disorder of muscle, unspecified: Secondary | ICD-10-CM | POA: Diagnosis present

## 2023-11-02 DIAGNOSIS — M79602 Pain in left arm: Secondary | ICD-10-CM | POA: Diagnosis not present

## 2023-11-02 DIAGNOSIS — M79601 Pain in right arm: Secondary | ICD-10-CM | POA: Diagnosis not present

## 2023-11-02 DIAGNOSIS — M5412 Radiculopathy, cervical region: Secondary | ICD-10-CM | POA: Insufficient documentation

## 2023-11-02 NOTE — Progress Notes (Signed)
 PROVIDER NOTE: Interpretation of information contained herein should be left to medically-trained personnel. Specific patient instructions are provided elsewhere under Patient Instructions section of medical record. This document was created in part using AI and STT-dictation technology, any transcriptional errors that may result from this process are unintentional.  Patient: Alex Lara.  Service: E/M Encounter  Provider: Eric DELENA Como, MD  DOB: 1940-08-10  Delivery: Face-to-face  Specialty: Interventional Pain Management  MRN: [id number]  Setting: Ambulatory outpatient facility  Specialty designation: 09  Type: New Patient  Location: Outpatient office facility  PCP: Sheryle Carwin, MD  DOS: 11/02/2023    Referring Prov.: Sheryle Carwin, MD   Primary Reason(s) for Visit: Encounter for initial evaluation of one or more chronic problems (new to examiner) potentially causing chronic pain, and posing a threat to normal musculoskeletal function. (Level of risk: High) CC: Back Pain (lower)  HPI  Alex Lara is a 83 y.o. year old, male patient, who comes for the first time to our practice referred by Sheryle Carwin, MD for our initial evaluation of his chronic pain. He has Osteoarthritis; Coronary artery disease involving native coronary artery of native heart without angina pectoris; Hyperlipidemia; Tobacco abuse; Deep vein thrombosis (HCC); Gastroesophageal reflux disease; Hypertension; Chest tightness; Dyspnea on exertion; Malignant neoplasm of prostate (HCC); Stroke-like symptoms; Stroke [facility]); Benign prostatic hyperplasia with urinary obstruction; Depression with anxiety; Erectile dysfunction due to arterial insufficiency; Weak urinary stream; Rectal pain; Bradycardia; Hypotension; Aortic aneurysm; AKI (acute kidney injury); Hemorrhoids; Grade II hemorrhoids; Prolapsed internal hemorrhoids, grade 3; Constipation; Dysphagia; Chronic pain syndrome; Pharmacologic therapy; Disorder of skeletal system; Problems  influencing health status; Chronic neck and back pain (1ry area of Pain) (Bilateral); Cervicalgia; Neck pain of over 3 months duration; Neck pain (Bilateral); Posterior neck pain; Neck and shoulder pain; Chronic shoulder pain (2ry area of Pain) (Bilateral); Radicular pain of shoulder (Bilateral); Chronic lower extremity pain (3ry area of Pain) (Bilateral) (L>R); Chronic radicular pain of lower extremity (Bilateral); Lumbosacral radiculopathy at L5 (Right); Lumbosacral radiculopathy at S1 (Left); Failed back surgical syndrome; History of back surgery; Cervicogenic headache (Bilateral); Occipital headache (GON) (Bilateral); Cervical facet joint pain (Bilateral); Chronic knee pain (Bilateral); Chronic upper extremity pain (Bilateral); and Musculoskeletal disorder involving upper trapezius muscle (Bilateral) on their problem list. Today he comes in for evaluation of his Back Pain (lower)  Pain Assessment: Location: Lower Back Radiating: both legs to the feet Onset: More than a month ago Duration:   Quality: Constant Severity: 8 /10 (subjective, self-reported pain score)  Effect on ADL: difficulty performing daily activities Timing: Constant Modifying factors: medications BP:    HR:    Onset and Duration: Sudden and Present longer than 3 months Cause of pain: Unknown Severity: No change since onset, NAS-11 at its worse: 8/10, NAS-11 at its best: 1/10, NAS-11 now: 6/10, and NAS-11 on the average: 6/10 Timing: Not influenced by the time of the day Aggravating Factors: Walking Alleviating Factors: Resting Associated Problems: Dizziness and Fatigue Quality of Pain: Aching Previous Examinations or Tests: MRI scan and X-rays Previous Treatments: Denies   Alex Lara is being evaluated for possible interventional pain management therapies for the treatment of his chronic pain.  Discussed the use of AI scribe software for clinical note transcription with the patient, who gave verbal consent to  proceed.  History of Present Illness   Alex Lara. [name] is an 83 year old male with spinal stenosis who presents with chronic neck and shoulder pain. Referred by Dr. Margrette, an  orthopedic specialist, for evaluation of spinal stenosis and pain management.  He experiences chronic neck pain radiating to both shoulders, with the most severe pain in the neck. The pain is constant and accompanied by headaches radiating from the back of the head to the top. He applies a lidocaine  cream four times daily, which provides some relief. He has not undergone neck surgeries, injections, or physical therapy for this pain.  He has bilateral shoulder pain, with the left shoulder being more painful. The left arm pain is constant, extending over the biceps to the elbow, without numbness or weakness. The right arm has intermittent pain extending to the elbow, causing nocturnal numbness in all fingers. He underwent successful carpal tunnel surgery on the left hand over ten years ago and maintains dexterity in both hands.  He experiences lower back pain radiating down the left leg to the little toe and the right leg to the big toe. He had L5 disc surgery over thirty years ago, involving disc material removal. The leg pain is constant, with the left leg being worse. He received cortisone injections in both knees and shoulders, with recent shoulder injections last week and knee injections last month, but reports no improvement in knee pain.        Alex Lara has been informed that this initial visit was an evaluation only.  On the follow up appointment I will go over the results, including ordered tests and available interventional therapies. At that time he will have the opportunity to decide whether to proceed with offered therapies or not. In the event that Mr. Fils prefers avoiding interventional options, this will conclude our involvement in the case.  Medication management recommendations may be provided  upon request.  Patient informed that diagnostic tests may be ordered to assist in identifying underlying causes, narrow the list of differential diagnoses and aid in determining candidacy for (or contraindications to) planned therapeutic interventions.  Historic Controlled Substance Pharmacotherapy Review PMP and historical list of controlled substances: Gabapentin  100 mg capsule, 1 cap p.o. daily (30/month) (last filled on 10/07/2023); clonazepam  0.5 mg tablet, 1 tab p.o. 3 times daily (90/month) (last filled on 10/06/2023); tramadol  50 mg tablet, 1 tab p.o. 3 times daily (90/month) (last filled on 10/04/2023); hydrocodone /APAP 5/325 (# 10) (last filled on 09/25/2023) Most recently prescribed controlled substance(s): Opioid Analgesic:  tramadol  50 mg tablet, 1 tab p.o. 3 times daily (90/month) (last filled on 10/04/2023) MME/day: 30.0 mg/day  Historical Monitoring: The patient  reports no history of drug use. List of prior UDS Testing: Lab Results  Component Value Date   COCAINSCRNUR NONE DETECTED 01/30/2019   THCU NONE DETECTED 01/30/2019   ETH <10 01/30/2019   Historical Background Evaluation: [place] PMP: PDMP reviewed during this encounter. Review of the past 66-months conducted.             PMP NARX Score Report:  Narcotic: 461 Sedative: 461 Stimulant: 000 [place] Department of public safety, offender search: Engineer, mining Information) Non-contributory Risk Assessment Profile: Aberrant behavior: None observed or detected today Risk factors for fatal opioid overdose: None identified today PMP NARX Overdose Risk Score: 170 Fatal overdose hazard ratio (HR): Calculation deferred Non-fatal overdose hazard ratio (HR): Calculation deferred Risk of opioid abuse or dependence: 0.7-3.0% with doses <= 36 MME/day and 6.1-26% with doses >= 120 MME/day. Substance use disorder (SUD) risk level: See below Personal History of Substance Abuse (SUD-Substance use disorder):  Alcohol: Negative  Illegal Drugs: Negative   Rx Drugs: Negative  ORT Risk Level  calculation: Low Risk  Opioid Risk Tool - 11/02/23 1242       Family History of Substance Abuse   Alcohol Negative    Illegal Drugs Negative    Rx Drugs Negative      Personal History of Substance Abuse   Alcohol Negative    Illegal Drugs Negative    Rx Drugs Negative      Age   Age between 16-45 years  No      History of Preadolescent Sexual Abuse   History of Preadolescent Sexual Abuse Negative or Male      Psychological Disease   Psychological Disease Negative    Depression Negative      Total Score   Opioid Risk Tool Scoring 0    Opioid Risk Interpretation Low Risk         ORT Scoring interpretation table:  Score <3 = Low Risk for SUD  Score between 4-7 = Moderate Risk for SUD  Score >8 = High Risk for Opioid Abuse   PHQ-2 Depression Scale:  Total score: 0  PHQ-2 Scoring interpretation table: (Score and probability of major depressive disorder)  Score 0 = No depression  Score 1 = 15.4% Probability  Score 2 = 21.1% Probability  Score 3 = 38.4% Probability  Score 4 = 45.5% Probability  Score 5 = 56.4% Probability  Score 6 = 78.6% Probability   PHQ-9 Depression Scale:  Total score: 0  PHQ-9 Scoring interpretation table:  Score 0-4 = No depression  Score 5-9 = Mild depression  Score 10-14 = Moderate depression  Score 15-19 = Moderately severe depression  Score 20-27 = Severe depression (2.4 times higher risk of SUD and 2.89 times higher risk of overuse)   Pharmacologic Plan: As per protocol, I have not taken over any controlled substance management, pending the results of ordered tests and/or consults.            Initial impression: Pending review of available data and ordered tests.  Meds   Current Outpatient Medications:    acetaminophen  (TYLENOL ) 500 MG tablet, Take 500 mg by mouth every 6 (six) hours as needed., Disp: , Rfl:    albuterol  (VENTOLIN  HFA) 108 (90 Base) MCG/ACT inhaler, Inhale 2 puffs into the lungs  every 4 (four) hours as needed for wheezing or shortness of breath., Disp: , Rfl:    ARNUITY ELLIPTA 100 MCG/ACT AEPB, Inhale 1 puff into the lungs daily., Disp: , Rfl:    aspirin  EC 325 MG tablet, Take 325 mg by mouth daily., Disp: , Rfl:    atorvastatin  (LIPITOR) 40 MG tablet, Take 1 tablet (40 mg total) by mouth daily at 6 PM., Disp: 30 tablet, Rfl: 3   Calcium  Carbonate-Vit D-Min (CALCIUM  1200 PO), Take by mouth daily., Disp: , Rfl:    clonazePAM  (KLONOPIN ) 0.5 MG tablet, Take 0.5 mg by mouth 3 (three) times daily as needed for anxiety., Disp: , Rfl:    fluticasone (FLOVENT HFA) 110 MCG/ACT inhaler, Inhale 2 puffs into the lungs 2 (two) times daily. Rinse mouth with water  after each use, Disp: 1 each, Rfl: 0   gabapentin  (NEURONTIN ) 100 MG capsule, Take 1 capsule (100 mg total) by mouth at bedtime., Disp: 60 capsule, Rfl: 5   Ibuprofen (ADVIL) 200 MG CAPS, Take 1 capsule by mouth daily as needed., Disp: , Rfl:    ipratropium (ATROVENT) 0.06 % nasal spray, Place into both nostrils., Disp: , Rfl:    lidocaine  (LMX) 4 % cream, Apply 1 Application topically  as needed., Disp: , Rfl:    losartan (COZAAR) 50 MG tablet, Take 50 mg by mouth daily., Disp: , Rfl:    lubiprostone  (AMITIZA ) 8 MCG capsule, Take 1 capsule (8 mcg total) by mouth 2 (two) times daily with a meal., Disp: 60 capsule, Rfl: 3   melatonin 5 MG TABS, Take 20 mg by mouth at bedtime., Disp: , Rfl:    pantoprazole  (PROTONIX ) 40 MG tablet, TAKE 1 TABLET(40 MG) BY MOUTH DAILY, Disp: 30 tablet, Rfl: 11   promethazine-dextromethorphan (PROMETHAZINE-DM) 6.25-15 MG/5ML syrup, Take 5 mLs by mouth 4 (four) times daily as needed., Disp: 100 mL, Rfl: 0   traMADol  (ULTRAM ) 50 MG tablet, Take 50 mg by mouth 3 (three) times daily as needed., Disp: , Rfl:   Current Facility-Administered Medications:    methylPREDNISolone  acetate (DEPO-MEDROL ) injection 40 mg, 40 mg, Intra-articular, Once, Margrette Taft BRAVO, MD   methylPREDNISolone  acetate  (DEPO-MEDROL ) injection 40 mg, 40 mg, Intra-articular, Once, Margrette Taft BRAVO, MD  Imaging Review  Cervical Imaging: Cervical DG complete: Results for orders placed in visit on 03/15/21 DG Cervical Spine Complete  Narrative Diagnostic neck x-rays bilateral shoulder and neck pain  Mid cervical spine severe uncovertebral joint arthritis osteophytes joint space narrowing  Hyperlordosis is noted sitting as well no major disc space narrowing is seen in the mid to lower cervical spine  Impression cervical spondylosis  Shoulder Imaging: Shoulder-L MR wo contrast: Results for orders placed during the hospital encounter of 03/19/15 MR Shoulder Left Wo Contrast  Narrative CLINICAL DATA:  Pain and weakness about the left shoulder for 8 months. No known injury. Initial encounter.  EXAM: MRI OF THE LEFT SHOULDER WITHOUT CONTRAST  TECHNIQUE: Multiplanar, multisequence MR imaging of the shoulder was performed. No intravenous contrast was administered.  COMPARISON:  None.  FINDINGS: Rotator cuff: There is rotator cuff tendinopathy. The patient has a partial width, full-thickness tear of the supraspinatus measuring approximately 1.3 cm from front to back with retraction of 1-2 cm. The rotator cuff is otherwise intact.  Muscles:  No atrophy or focal lesion.  Biceps long head: The tendon is completely torn from the superior labrum and retracted approximately 5 cm below the top of the humeral head.  Acromioclavicular Joint:  Bulky degenerative change is seen.  Glenohumeral Joint: Unremarkable.  Labrum:  Intact.  Bones: No fracture or worrisome marrow lesion. There is some subacromial spurring. Fluid is present in the subacromial/subdeltoid bursa.  IMPRESSION: Rotator cuff tendinopathy with a partial width, full-thickness tear of the anterior and far lateral supraspinatus measuring approximately 1.3 cm from front to back with 1-2 cm of retraction. No atrophy.  Complete tear  of the long head of biceps from the superior labrum.  Bulky acromioclavicular osteoarthritis. Subacromial spur also identified.  Subacromial/subdeltoid bursitis.   Electronically Signed By: Debby Prader M.D. On: 03/19/2015 16:40  Shoulder-R DG: Results for orders placed in visit on 11/15/21 DG Shoulder Right  Narrative Right shoulder pain chronic  Obvious proximal migration of the humeral head break in Shenton's line glenohumeral joint otherwise is clear there is no osteophyte formation.  There is greater tuberosity sclerosis  Impression chronic rotator cuff disease changes in the bone with proximal migration of the humerus and sclerosis of the greater tuberosity  Shoulder-L DG: Results for orders placed in visit on 11/15/21 DG Shoulder Left  Narrative Chronic left shoulder pain  3 views left shoulder  No posterior erosion is seen in the glenoid.  The humeral head is riding slightly high with  break of Shenton's line of the shoulder.  The acromion is curved there is no hook  There is osteopenia in the bone  Impression  1.  Proximal migration of the humerus which is seen in chronic rotator cuff disease  2.  Osteopenia  Thoracic Imaging: Thoracic DG w/swimmers view: Results for orders placed during the hospital encounter of 07/28/21 [facility] Thoracic Spine W/Swimmers  Narrative CLINICAL DATA:  Back pain.  Mid back pain and weakness.  EXAM: THORACIC SPINE - 3 VIEWS  COMPARISON:  No prior thoracic spine exams. Cervical spine radiograph 03/15/2021. Lower thoracic spine reformats from abdominal CT 03/23/2018  FINDINGS: Exaggerated upper thoracic kyphosis. Known T11 compression fracture is grossly stable. There additional mild compression fractures of tentatively number T4, T5, and T9. Multilevel anterior spurring with slight diffuse disc space narrowing. No definite paravertebral soft tissue abnormalities.  IMPRESSION: 1. Chronic compression deformities at T11,  also T4 and T5, stable from prior imaging. 2. Mild T9 compression fracture with no comparisons to assess for acuity. 3. Diffuse degenerative disc disease.   Electronically Signed By: Andrea Gasman M.D. On: 07/28/2021 18:55  Lumbosacral Imaging: Lumbar DG 2-3 views: Results for orders placed in visit on 03/29/21 DG Lumbar Spine 2-3 Views  Narrative Spine films x3 patient complains of back pain  Patient has abnormal coronal and sagittal plane alignment multilevel degenerative disc disease with disc space narrowing at multiple levels  Significant osteophytes  No evidence of spondylolysis or listhesis  Impression severe spondylosis and degenerative disc disease lumbar spine  Lumbar DG (Complete) 4+V: Results for orders placed during the hospital encounter of 07/28/21 DG Lumbar Spine Complete  Narrative CLINICAL DATA:  Back pain  EXAM: LUMBAR SPINE - COMPLETE 4+ VIEW  COMPARISON:  CT abdomen and pelvis 03/23/2018  FINDINGS: Mild levocurvature of the lumbar spine. Lumbar vertebral body height are preserved without evidence of fracture. Minimal grade 1 retrolisthesis of L2 on L3 and L4 on L5. No spondylolysis visualized. Mild to moderate intervertebral disc space narrowing at L1-L2 and L4-L5. Facet arthropathy.  Calcified plaques in the abdominal aorta which measures up to 4 cm in diameter, appears increased since previous CT.  IMPRESSION: 1. Degenerative changes of the lumbar spine as described. 2. Distal abdominal aortic aneurysm appears to measure 4 cm in diameter which would be increased since previous CT. Follow-up ultrasound or cross-sectional imaging recommended.   Electronically Signed By: Catarina Pouch M.D. On: 07/28/2021 15:10  Hip Imaging: Hip-L DG 2-3 views: Results for orders placed in visit on 12/19/22 DG HIP UNILAT WITH PELVIS 2-3 VIEWS LEFT  Narrative Multiple views left hip  Status post femoral nailing for what was either a hip  fracture or femur fracture in any event there is a cephalic medullary nail making me think this was for inotrope fracture there is also an abnormality near the area just proximal to the midshaft which may have been the fracture site as well.  In any event no arthritis in the hip joint  Impression healed fracture left femur/hip no problems related to the nail.  We do note femoral artery calcifications  Knee Imaging: Knee-L DG 4 views: Results for orders placed during the hospital encounter of 05/30/05 DG Knee Complete 4 Views Left  Narrative Clinical Data: Pain, left knee effusion. History of fracture of the femur on 03/29/05 has had an intramedullary rod placed. LEFT KNEE - 4 VIEW: Findings: AP lateral and both oblique views of the left knee are made and show a small left joint  effusion that was not present on the previous study of 03/29/05. There are again noted the anterior superior degenerative and posterior degenerative hypertrophic spurs of the upper patella. The intramedullary rod appears to be in good and is fixed with 2 large screws in the distal femur. There is noted calcification of the articular cartilages of both medial and lateral aspects of the knee. There also appears to be some flattening of the medial femoral condyle suggesting possibly an old injury in that area. No acute fracture is seen.  Impression 1. Degenerative arthritic changes superior aspect of the patella. Probable small joint effusion. 2. Articular cartilage calcification with flattening of the medial femoral condyle. No acute fracture, dislocation or foreign body is seen in the joint space.  Provider: Angeline Lesches  Foot Imaging: Foot-L DG Complete: Results for orders placed in visit on 12/19/22 DG Foot Complete Left  Narrative Complains of left foot pain  X-rays show squaring off of the first metatarsal consistent with osteoarthritis no major osteophytes are noted.  The small toe also is visualized as well  where the pain was there is no obvious problems in that area  Impression osteoarthritis great toe left foot  Wrist Imaging: Wrist-L DG Complete: Results for orders placed in visit on 10/29/00 DG Wrist Complete Left  Narrative FINDINGS CLINICAL DATA:  CAT BITE, HAND SWELLING. LEFT HAND COMPLETE 10/29/00 THERE IS GAS SEEN WITHIN THE SOFT TISSUES OF THE ANTERIOR THUMB.  ADJACENT TO THIS SOFT TISSUE GAS, THERE IS A SLIGHTLY RADIOPAQUE DENSITY SEEN WHICH COULD REPRESENT A MINIMALLY RADIOPAQUE FOREIGN BODY.  UNDERLYING BONES ARE UNREMARKABLE.  NO FRACTURE, SUBLUXATION, DISLOCATION OR EVIDENCE OF OSTEOMYELITIS. IMPRESSION SOFT TISSUE GAS IN THE LEFT THUMB.  THERE MAY BE MINIMALLY RADIOPAQUE FOREIGN BODY IN THE SOFT TISSUES AS WELL. LEFT WRIST TWO VIEWS 10/29/00 NO ACUTE BONE ABNORMALITY IS SEEN.  SPECIFICALLY, THERE IS NO EVIDENCE OF FRACTURE, SUBLUXATION OR DISLOCATION.  NO EVIDENCE OF OSTEOMYELITIS. IMPRESSION NO ACUTE BONY ABNORMALITY.  Hand Imaging: Hand-L DG Complete: Results for orders placed in visit on 10/29/00 DG Hand Complete Left  Narrative FINDINGS CLINICAL DATA:  CAT BITE, HAND SWELLING. LEFT HAND COMPLETE 10/29/00 THERE IS GAS SEEN WITHIN THE SOFT TISSUES OF THE ANTERIOR THUMB.  ADJACENT TO THIS SOFT TISSUE GAS, THERE IS A SLIGHTLY RADIOPAQUE DENSITY SEEN WHICH COULD REPRESENT A MINIMALLY RADIOPAQUE FOREIGN BODY.  UNDERLYING BONES ARE UNREMARKABLE.  NO FRACTURE, SUBLUXATION, DISLOCATION OR EVIDENCE OF OSTEOMYELITIS. IMPRESSION SOFT TISSUE GAS IN THE LEFT THUMB.  THERE MAY BE MINIMALLY RADIOPAQUE FOREIGN BODY IN THE SOFT TISSUES AS WELL. LEFT WRIST TWO VIEWS 10/29/00 NO ACUTE BONE ABNORMALITY IS SEEN.  SPECIFICALLY, THERE IS NO EVIDENCE OF FRACTURE, SUBLUXATION OR DISLOCATION.  NO EVIDENCE OF OSTEOMYELITIS. IMPRESSION NO ACUTE BONY ABNORMALITY.  Complexity Note: Imaging results reviewed.                         ROS  Cardiovascular: Heart trouble, Daily Aspirin   intake, High blood pressure, Heart surgery, and Blood thinners:  Antiplatelet Pulmonary or Respiratory: Smoking Neurological: Stroke (Residual deficits or weakness:  ) and Curved spine Psychological-Psychiatric: Depressed Gastrointestinal: Heartburn due to stomach pushing into lungs (Hiatal hernia), Reflux or heatburn, and Irregular, infrequent bowel movements (Constipation) Genitourinary: No reported renal or genitourinary signs or symptoms such as difficulty voiding or producing urine, peeing blood, non-functioning kidney, kidney stones, difficulty emptying the bladder, difficulty controlling the flow of urine, or chronic kidney disease Hematological: Brusing easily Endocrine: No reported endocrine signs  or symptoms such as high or low blood sugar, rapid heart rate due to high thyroid  levels, obesity or weight gain due to slow thyroid  or thyroid  disease Rheumatologic: No reported rheumatological signs and symptoms such as fatigue, joint pain, tenderness, swelling, redness, heat, stiffness, decreased range of motion, with or without associated rash Musculoskeletal: Negative for myasthenia gravis, muscular dystrophy, multiple sclerosis or malignant hyperthermia Work History: Retired  Allergies  Mr. Peerson is allergic to codeine.  Laboratory Chemistry Profile   Renal Lab Results  Component Value Date   BUN 26 (H) 10/17/2023   CREATININE 1.17 10/17/2023   GFRAA >60 01/30/2019   GFRNONAA >60 10/17/2023   SPECGRAV 1.020 03/09/2023   PHUR 6.0 03/09/2023   PROTEINUR NEGATIVE 10/17/2023     Electrolytes Lab Results  Component Value Date   NA 139 10/17/2023   K 3.6 10/17/2023   CL 105 10/17/2023   CALCIUM  9.2 10/17/2023   MG 1.8 08/16/2021   PHOS 3.2 08/16/2021     Hepatic Lab Results  Component Value Date   AST 65 (H) 10/17/2023   ALT 22 10/17/2023   ALBUMIN 4.5 10/17/2023   ALKPHOS 70 10/17/2023   LIPASE 36 08/03/2023     ID Lab Results  Component Value Date   SARSCOV2NAA  NEGATIVE 10/05/2023     Bone No results found for: VD25OH, [id number], CI6874NY7, CI7874NY7, 25OHVITD1, 25OHVITD2, 25OHVITD3, TESTOFREE, TESTOSTERONE   Endocrine Lab Results  Component Value Date   GLUCOSE 109 (H) 10/17/2023   GLUCOSEU NEGATIVE 10/17/2023   HGBA1C 5.5 01/31/2019   TSH 1.053 08/16/2021     Neuropathy Lab Results  Component Value Date   VITAMINB12 504 08/18/2021   FOLATE 6.9 08/18/2021   HGBA1C 5.5 01/31/2019     CNS No results found for: COLORCSF, APPEARCSF, RBCCOUNTCSF, WBCCSF, POLYSCSF, LYMPHSCSF, EOSCSF, PROTEINCSF, GLUCCSF, JCVIRUS, CSFOLI, IGGCSF, LABACHR, ACETBL   Inflammation (CRP: Acute  ESR: Chronic) Lab Results  Component Value Date   CRP 0.1 10/15/2008   ESRSEDRATE 3 10/15/2008   LATICACIDVEN 1.2 08/16/2021     Rheumatology No results found for: RF, ANA, LABURIC, URICUR, LYMEIGGIGMAB, LYMEABIGMQN, HLAB27   Coagulation Lab Results  Component Value Date   INR 1.1 08/16/2021   LABPROT 13.9 08/16/2021   APTT 29 08/16/2021   PLT 146 (L) 10/17/2023     Cardiovascular Lab Results  Component Value Date   BNP 63.9 08/17/2021   HGB 11.9 (L) 10/17/2023   HCT 36.0 (L) 10/17/2023     Screening Lab Results  Component Value Date   SARSCOV2NAA NEGATIVE 10/05/2023     Cancer No results found for: CEA, CA125, LABCA2   Allergens No results found for: ALMOND, APPLE, ASPARAGUS, AVOCADO, BANANA, BARLEY, BASIL, BAYLEAF, GREENBEAN, LIMABEAN, WHITEBEAN, BEEFIGE, REDBEET, BLUEBERRY, BROCCOLI, CABBAGE, MELON, CARROT, CASEIN, CASHEWNUT, CAULIFLOWER, CELERY     Note: Lab results reviewed.  PFSH  Drug: Mr. Chavous  reports no history of drug use. Alcohol:  reports that he does not currently use alcohol. Tobacco:  reports that he has been smoking cigarettes. He has a 82.5 pack-year smoking history. He has been exposed to tobacco smoke. He has  never used smokeless tobacco. Medical:  has a past medical history of Anxiety and depression, Arteriosclerotic cardiovascular disease (ASCVD), Blood clotting disorder, Carpal tunnel syndrome, Chronic anticoagulation, Chronic upper extremity pain (11/02/2023), Deep vein thrombosis (HCC), Degenerative joint disease, Gastroesophageal reflux disease, Hyperlipidemia, Hypertension, Myocardial infarction [facility]), Prostate cancer (HCC), Stroke (HCC), and Tobacco abuse. Family: family history includes Breast cancer in his maternal  aunt; Coronary artery disease in his father and mother; Heart disease in his mother; Lung cancer in his cousin; Melanoma in his daughter.  Past Surgical History:  Procedure Laterality Date   BACK SURGERY  1988   CARDIAC CATHETERIZATION  2004   stents   CATARACT EXTRACTION W/PHACO Right 04/04/2022   Procedure: CATARACT EXTRACTION PHACO AND INTRAOCULAR LENS PLACEMENT (IOC);  Surgeon: Harrie Agent, MD;  Location: [facility];  Service: Ophthalmology;  Laterality: Right;  CDE: 10.63   COLONOSCOPY  ?  Date   COLONOSCOPY WITH PROPOFOL  N/A 07/14/2021   Procedure: COLONOSCOPY WITH PROPOFOL ;  Surgeon: Cindie Carlin POUR, DO;  Location: [facility];  Service: Endoscopy;  Laterality: N/A;  8:15am, asa 3   IM NAILING HUMERUS Left    INGUINAL HERNIA REPAIR     ORIF-unknown fracture     POLYPECTOMY  07/14/2021   Procedure: POLYPECTOMY;  Surgeon: Cindie Carlin POUR, DO;  Location: [facility];  Service: Endoscopy;;   PROSTATE BIOPSY     ROTATOR CUFF REPAIR     Active Ambulatory Problems    Diagnosis Date Noted   Osteoarthritis 02/14/2006   Coronary artery disease involving native coronary artery of native heart without angina pectoris    Hyperlipidemia    Tobacco abuse    Deep vein thrombosis (HCC)    Gastroesophageal reflux disease    Hypertension    Chest tightness 05/04/2016   Dyspnea on exertion 05/04/2016   Malignant neoplasm of prostate (HCC) 04/22/2018   Stroke-like symptoms  01/30/2019   Stroke (HCC) 01/30/2019   Benign prostatic hyperplasia with urinary obstruction    Depression with anxiety    Erectile dysfunction due to arterial insufficiency 09/13/2019   Weak urinary stream 09/13/2019   Rectal pain 08/10/2021   Bradycardia 08/16/2021   Hypotension 08/16/2021   Aortic aneurysm 08/16/2021   AKI (acute kidney injury) 08/16/2021   Hemorrhoids 09/30/2021   Grade II hemorrhoids 10/21/2021   Prolapsed internal hemorrhoids, grade 3 05/26/2022   Constipation 11/01/2022   Dysphagia 11/01/2022   Chronic pain syndrome 11/02/2023   Pharmacologic therapy 11/02/2023   Disorder of skeletal system 11/02/2023   Problems influencing health status 11/02/2023   Chronic neck and back pain (1ry area of Pain) (Bilateral) 11/02/2023   Cervicalgia 11/02/2023   Neck pain of over 3 months duration 11/02/2023   Neck pain (Bilateral) 11/02/2023   Posterior neck pain 11/02/2023   Neck and shoulder pain 11/02/2023   Chronic shoulder pain (2ry area of Pain) (Bilateral) 11/02/2023   Radicular pain of shoulder (Bilateral) 11/02/2023   Chronic lower extremity pain (3ry area of Pain) (Bilateral) (L>R) 11/02/2023   Chronic radicular pain of lower extremity (Bilateral) 11/02/2023   Lumbosacral radiculopathy at L5 (Right) 11/02/2023   Lumbosacral radiculopathy at S1 (Left) 11/02/2023   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   History of back surgery 11/02/2023   Cervicogenic headache (Bilateral) 11/02/2023   Occipital headache (GON) (Bilateral) 11/02/2023   Cervical facet joint pain (Bilateral) 11/02/2023   Chronic knee pain (Bilateral) 11/02/2023   Chronic upper extremity pain (Bilateral) 11/02/2023   Musculoskeletal disorder involving upper trapezius muscle (Bilateral) 11/02/2023   Resolved Ambulatory Problems    Diagnosis Date Noted   SYNDROME, CARPAL TUNNEL 02/14/2006   Open fracture of one or more phalanges of foot 09/23/2008   OPEN FRACTURE DISTAL PHALANX OR PHALANGES  HAND 01/27/2010   Open wound of finger 01/27/2010   Past Medical History:  Diagnosis Date   Anxiety and depression    Arteriosclerotic  cardiovascular disease (ASCVD)    Blood clotting disorder    Chronic anticoagulation    Chronic upper extremity pain 11/02/2023   Degenerative joint disease    Myocardial infarction [facility]    Prostate cancer (HCC)    Constitutional Exam  General appearance: Well nourished, well developed, and well hydrated. In no apparent acute distress Vitals:   11/02/23 1226  Resp: 18  Weight: 170 lb (77.1 kg)  Height: 6' (1.829 m)   BMI Assessment: Estimated body mass index is 23.06 kg/m as calculated from the following:   Height as of this encounter: 6' (1.829 m).   Weight as of this encounter: 170 lb (77.1 kg).  BMI interpretation table: BMI level Category Range association with higher incidence of chronic pain  <18 kg/m2 Underweight   18.5-24.9 kg/m2 Ideal body weight   25-29.9 kg/m2 Overweight Increased incidence by 20%  30-34.9 kg/m2 Obese (Class I) Increased incidence by 68%  35-39.9 kg/m2 Severe obesity (Class II) Increased incidence by 136%  >40 kg/m2 Extreme obesity (Class III) Increased incidence by 254%   Patient's current BMI Ideal Body weight  Body mass index is 23.06 kg/m. Ideal body weight: 77.6 kg (171 lb 1.2 oz)   BMI Readings from Last 4 Encounters:  11/02/23 23.06 kg/m  10/17/23 23.06 kg/m  10/05/23 23.06 kg/m  09/24/23 23.06 kg/m   Wt Readings from Last 4 Encounters:  11/02/23 170 lb (77.1 kg)  10/17/23 170 lb 0.3 oz (77.1 kg)  10/05/23 170 lb (77.1 kg)  09/24/23 170 lb (77.1 kg)    Psych/Mental status: Alert, oriented x 3 (person, place, & time)       Eyes: PERLA Respiratory: No evidence of acute respiratory distress  Assessment  Primary Diagnosis & Pertinent Problem List: The primary encounter diagnosis was Chronic neck and back pain (1ry area of Pain) (Bilateral). Diagnoses of Cervicalgia, Neck pain of over 3  months duration, Neck pain (Bilateral), Posterior neck pain, Cervical facet joint pain (Bilateral), Cervicogenic headache (Bilateral), Occipital headache (GON) (Bilateral), Neck and shoulder pain, Musculoskeletal disorder involving upper trapezius muscle (Bilateral), Chronic upper extremity pain (Bilateral), Chronic shoulder pain (2ry area of Pain) (Bilateral), Radicular pain of shoulder (Bilateral), Chronic lower extremity pain (3ry area of Pain) (Bilateral) (L>R), Failed back surgical syndrome, Chronic radicular pain of lower extremity (Bilateral), Lumbosacral radiculopathy at L5 (Right), Lumbosacral radiculopathy at S1 (Left), History of back surgery, Chronic knee pain (Bilateral), Chronic pain syndrome, Pharmacologic therapy, Disorder of skeletal system, and Problems influencing health status were also pertinent to this visit.  Visit Diagnosis (New problems to examiner): 1. Chronic neck and back pain (1ry area of Pain) (Bilateral)   2. Cervicalgia   3. Neck pain of over 3 months duration   4. Neck pain (Bilateral)   5. Posterior neck pain   6. Cervical facet joint pain (Bilateral)   7. Cervicogenic headache (Bilateral)   8. Occipital headache (GON) (Bilateral)   9. Neck and shoulder pain   10. Musculoskeletal disorder involving upper trapezius muscle (Bilateral)   11. Chronic upper extremity pain (Bilateral)   12. Chronic shoulder pain (2ry area of Pain) (Bilateral)   13. Radicular pain of shoulder (Bilateral)   14. Chronic lower extremity pain (3ry area of Pain) (Bilateral) (L>R)   15. Failed back surgical syndrome   16. Chronic radicular pain of lower extremity (Bilateral)   17. Lumbosacral radiculopathy at L5 (Right)   18. Lumbosacral radiculopathy at S1 (Left)   19. History of back surgery   20. Chronic knee  pain (Bilateral)   21. Chronic pain syndrome   22. Pharmacologic therapy   23. Disorder of skeletal system   24. Problems influencing health status    Plan of Care (Initial  workup plan)  Note: Mr. Aliano was reminded that as per protocol, today's visit has been an evaluation only. We have not taken over the patient's controlled substance management.  Problem-specific plan: Assessment and Plan    Chronic cervical radiculopathy and neck pain   He experiences chronic neck pain with radicular symptoms affecting both shoulders and arms, more severe on the left side. The pain is constant, worsens with movement, and is accompanied by headaches. There is no history of neck surgeries or physical therapy, and no recent imaging studies have been conducted. Lidocaine  cream provides some relief. Plan: Order cervical spine x-rays with flexion and extension views, recommend physical therapy, provide information on TENS unit for pain management, and consider MRI of the cervical spine if x-rays indicate instability.  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 He had L5-S1 surgery over 30 years ago, and no recent imaging studies have been performed. The pain distribution suggests issues at the L4-5 level. Lidocaine  cream is used for symptomatic relief. Plan: Order lumbar spine x-rays with flexion and extension views, consider MRI of the lumbar spine if x-rays indicate instability, and order blood work to assess factors affecting pain perception.  Chronic bilateral shoulder pain   He has chronic bilateral shoulder pain with recent steroid injections providing no relief. Lidocaine  cream is used for symptomatic relief.  Chronic bilateral knee pain   He experiences chronic bilateral knee pain with recent steroid injections providing no relief. The pain is diffuse in the left knee and localized to the top of the patella on the right knee. Lidocaine  cream is used for symptomatic relief.  Urinary frequency   He has increased urinary frequency  possibly related to previous use of Lasix, with no current diuretic use. Plan: Follow up with primary care physician regarding diuretic use and potential heart-related fluid retention.       Lab Orders         Compliance Drug Analysis, Ur         Magnesium         Vitamin B12         Sedimentation rate         25-Hydroxy vitamin D Lcms D2+D3         C-reactive protein     Imaging Orders         DG Cervical Spine With Flex & Extend         DG Shoulder Right         DG Shoulder Left         DG Knee Complete 4 Views Right         DG Knee Complete 4 Views Left         DG Lumbar Spine Complete W/Bend         MR LUMBAR SPINE WO CONTRAST         MR CERVICAL SPINE WO CONTRAST     Referral Orders         Ambulatory referral to Physical Therapy     Procedure Orders    No procedure(s) ordered today   Pharmacotherapy (current): Medications ordered:  No orders of the defined types were placed  in this encounter.  Medications administered during this visit: Alex HILARIO Glendia Lara. [name] had no medications administered during this visit.   Analgesic Pharmacotherapy:  Opioid Analgesics: For patients currently taking or requesting to take opioid analgesics, in accordance with   Medical Board Guidelines, we will assess their risks and indications for the use of these substances. After completing our evaluation, we may offer recommendations, but we no longer take patients for medication management. The prescribing physician will ultimately decide, based on his/her training and level of comfort whether to adopt any of the recommendations, including whether or not to prescribe such medicines.  Membrane stabilizer: To be determined at a later time  Muscle relaxant: To be determined at a later time  NSAID: To be determined at a later time  Other analgesic(s): To be determined at a later time   Interventional management options: Mr. Gigante was informed that there is no guarantee that he  would be a candidate for interventional therapies. The decision will be based on the results of diagnostic studies, as well as Mr. Mcfadden risk profile.  Procedure(s) under consideration:  Pending results of ordered studies     Interventional Therapies  Risk Factors  Considerations  Medical Comorbidities:  AA  CAD  Hx. DVT  GERD  HTN  Hx. Stroke  Tobacco Abuse  Constipation  advanced age     Planned  Pending:   Referral to physical therapy to work with the patient's neck pain and upper back pain (11/02/2023)    Under consideration:   Diagnostic/therapeutic midline cervical ESI #1  Diagnostic/therapeutic bilateral trapezius muscle TPI/MNB #1  Diagnostic/therapeutic midline caudal ESI #1 + diagnostic epidurogram #1    Completed: (Analgesic benefit)1  None at this time   Therapeutic  Palliative (PRN) options:   None established   Completed by other providers:   None reported  1(Analgesic benefit): Expressed in percentage (%). (Local anesthetic[LA] +/- sedation  L.A.Local Anesthetic  Steroid benefit  Ongoing benefit)   Provider-requested follow-up: Return in about 2 weeks (around 11/16/2023) for ( ), Eval-day (M,W), (Face2F), 2nd Visit, to review of ordered test(s).  Future Appointments  Date Time Provider Department Center  12/12/2023  3:30 PM Shirlean Therisa ORN, NP [facility]   I discussed the assessment and treatment plan with the patient. The patient was provided an opportunity to ask questions and all were answered. The patient agreed with the plan and demonstrated an understanding of the instructions.  Patient advised to call back or seek an in-person evaluation if the symptoms or condition worsens.  Duration of encounter: 80 minutes.  Total time on encounter, as per AMA guidelines included both the face-to-face and non-face-to-face time personally spent by the physician and/or other qualified health care professional(s) on the day of the encounter (includes time  in activities that require the physician or other qualified health care professional and does not include time in activities normally performed by clinical staff). Physician's time may include the following activities when performed: Preparing to see the patient (e.g., pre-charting review of records, searching for previously ordered imaging, lab work, and nerve conduction tests) Review of prior analgesic pharmacotherapies. Reviewing PMP Interpreting ordered tests (e.g., lab work, imaging, nerve conduction tests) Performing post-procedure evaluations, including interpretation of diagnostic procedures Obtaining and/or reviewing separately obtained history Performing a medically appropriate examination and/or evaluation Counseling and educating the patient/family/caregiver Ordering medications, tests, or procedures Referring and communicating with other health care professionals (when not separately reported) Documenting clinical information in the electronic or other  health record Independently interpreting results (not separately reported) and communicating results to the patient/ family/caregiver Care coordination (not separately reported)  Note by: Eric DELENA Como, MD (TTS and AI technology used. I apologize for any typographical errors that were not detected and corrected.) Date: 11/02/2023; Time: 1:51 PM

## 2023-11-02 NOTE — Progress Notes (Signed)
 Safety precautions to be maintained throughout the outpatient stay will include: orient to surroundings, keep bed in low position, maintain call bell within reach at all times, provide assistance with transfer out of bed and ambulation.

## 2023-11-02 NOTE — Patient Instructions (Addendum)
 ______________________________________________________________________    New Patients  Welcome to [facility] ______________________________________________________________________    Patient Information update  To: All of our patients.  Re: Name change.  It has been made official that our current name, "[facility]"   will soon be changed to "[facility]".   The purpose of this change is to eliminate any confusion created by the concept of our practice being a "Medication Management Pain Clinic". In the past this has led to the misconception that we treat pain primarily by the use of prescription medications.  Nothing can be farther from the truth.   Understanding PAIN MANAGEMENT: To further understand what our practice does, you first have to understand that "Pain Management" is a subspecialty that requires additional training once a physician has completed their specialty training, which can be in either Anesthesia, Neurology, Psychiatry, or Physical Medicine and Rehabilitation (PMR). Each one of these contributes to the final approach taken by each physician to the management of their patient's pain. To be a "Pain Management Specialist" you must have first completed one of the specialty trainings below.  Anesthesiologists - trained in clinical pharmacology and interventional techniques such as nerve blockade and regional as well as central neuroanatomy. They are trained to block pain before, during, and after surgical interventions.  Neurologists - trained in the diagnosis and pharmacological treatment of complex neurological conditions, such as Multiple Sclerosis, Parkinson's, spinal cord injuries, and other systemic conditions that may be associated with symptoms that may include but are not limited to pain. They tend to rely primarily on  the treatment of chronic pain using prescription medications.  Psychiatrist - trained in conditions affecting the psychosocial wellbeing of patients including but not limited to depression, anxiety, schizophrenia, personality disorders, addiction, and other substance use disorders that may be associated with chronic pain. They tend to rely primarily on the treatment of chronic pain using prescription medications.   Physical Medicine and Rehabilitation (PMR) physicians, also known as physiatrists - trained to treat a wide variety of medical conditions affecting the brain, spinal cord, nerves, bones, joints, ligaments, muscles, and tendons. Their training is primarily aimed at treating patients that have suffered injuries that have caused severe physical impairment. Their training is primarily aimed at the physical therapy and rehabilitation of those patients. They may also work alongside orthopedic surgeons or neurosurgeons using their expertise in assisting surgical patients to recover after their surgeries.  INTERVENTIONAL PAIN MANAGEMENT is sub-subspecialty of Pain Management.  Our physicians are Board-certified in Anesthesia, Pain Management, and Interventional Pain Management.  This meaning that not only have they been trained and Board-certified in their specialty of Anesthesia, and subspecialty of Pain Management, but they have also received further training in the sub-subspecialty of Interventional Pain Management, in order to become Board-certified as INTERVENTIONAL PAIN MANAGEMENT SPECIALIST.    Mission: Our goal is to use our skills in  INTERVENTIONAL PAIN MANAGEMENT as alternatives to the chronic use of prescription opioid medications for the treatment of pain. To make this more clear, we have changed our name to reflect what we do and offer. We will continue to offer medication management assessment and recommendations, but we will not be taking over any patient's medication  management.  ______________________________________________________________________       ______________________________________________________________________    Appointment Information  It is our goal and responsibility to provide the medical community with assistance in the evaluation and management  of patients with chronic pain. Unfortunately our resources are limited. Because we do not have an unlimited amount of time, or available appointments, we are required to closely monitor for unkept or cancelled appointments.  Patient's responsibilities: 1. Punctuality: Patients are required to be physically present in our office at least 15 minutes before their scheduled appointment. 2. Tardiness: Patients not physically present in our office at their scheduled appointment time will be rescheduled. 3. Plan ahead: Assume that you will encounter traffic and plan to arrive 30 minutes before your appointment. 4. Other appointments and responsibilities: Do not schedule other appointments immediately before or after your scheduled appointment.  5. Be prepared: Make a list of everything that you need to discuss with your provider so that you use your time efficiently. Once the provider leaves your room, he/she will not return to your room to discuss anything that you neglected to bring up during your allowed time. 6. No children or pets: Do not bring children or pets to your appointment. 7. Cancelling or rescheduling your appointment: Advanced notification (more than 24 hours in advance) is required. 8. No Show: Not calling to cancel an appointment and simply not showing up is unacceptable. This leads to loss of appointments that could have been used by a patient in need. (See below)  Corrective process for repeat offenders:  No Shows: Three (3) No Shows within a 12 month period will result in an automatic discharge from our practice. Rescheduling or cancelling with more than 24 hours notice  will not be penalized and will not count against you. Tardiness: If you have to be rescheduled three (3) times due to late arrivals, it will be counted as one (1) No Show. Cancellation or reschedule: Three (3) cancellations or rescheduling where notice was given with less than 24 hours in advance, will be recorded as one (1) No Show.  Types of appointments: New patient initial evaluation: These are evaluations only. Your initial patient questionnaire will be collected and entered into the system. A history of present illness will be taken. Prior lab work, imaging studies, and associated treatments will be reviewed. The provider may order appropriate diagnostic testing depending on their evaluation and review of available information. No treatments will be started on this visit. 2nd Follow-up visit: During this visit your provider will inform you of the results of the diagnostic tests ordered on the initial evaluation. Based on the providers assessment, treatment options will be offered, at which the patient will decide if he/she is interested in the alternatives. If interested, a treatment plan will be established and started. Procedure visits: Post-procedure evaluation visits: Evaluation visits MM New problems Flare-up evaluations Follow-up after diagnostic testing ______________________________________________________________________    ists at [facility].   Initial Visit The first or initial visit consists of an evaluation only.   Interventional pain management.  This is an interventional pain management medical practice. This means that we offer therapies other than prescription controlled substances to manage chronic pain. These therapies may include, but are not limited to, diagnostic, therapeutic, and palliative specialized injection therapies (i.e.: Epidural Steroids, Facet Blocks, etc.). We specialize in a variety of nerve blocks as well as radiofrequency treatments. We offer  pain implant evaluations and trials, as well as follow up management. In addition we also provide a variety joint injections, including Viscosupplementation (AKA: Gel Therapy).  Prescription Pain Medication. We specialize in alternatives to opioids.  Although we can provide evaluations and recommendations for/of pharmacologic therapies based on CDC Guidelines, we no  longer take patients for long-term medication management. Please be clear that we will not be taking over the prescribing of your pain medications.  ______________________________________________________________________    ______________________________________________________________________    TENS (Device can be purchased online, without prescription. Search: TENS 7000.) Transcutaneous electrical nerve stimulation (TENS) is a method of pain relief that involves the use of mild electrical stimulation. A TENS machine is a small, battery-operated device that has leads connected to sticky pads called electrodes. Available at [employer]. (Estimated price as of July 10th, 2025.)  (Estimated [employer] cost: $38.88) Rechargeable 9V batteries:  (Estimated [employer] cost: $12.98)  Larger Reusable 2 x 4 TENS Pads/Electrodes:  (Estimated [employer] cost: $9.99)  Total cost: $61.85    ELECTRODE PLACEMENT:   TENS UNIT SAFETY WARNING SHEET and INFORMATION INDICATIONS AND CONTRAINDICTIONS Read the operation manual before using the device. Freight forwarder ([place] ) restricts this device to sale by or on the order of a physician. Observe your physician's precise instructions and let him show you where to apply the electrodes. For a successful therapy, the correct application of the electrodes is an important factor. Carefully write down the settings your physician recommended. Indications for use This device is a prescription device and only for symptomatic relief of chronic intractable pain.  Contraindications:   Any electrode placement that applies  current to the carotid sinus (neck) region.   Patients with implanted electronic devices (for example, a pacemaker) or metallic implants should not undertake.   Any electrode placement that causes current to flow transcerebrally (through the head). The use of unit whenever pain symptoms are undiagnosed, unit etiology is determined.   The use of TENS whenever pain syndromes are undiagnosed, until etiology is established.   WARNINGS AND PRECAUTIONS  Warnings:   The device must be kept out of reach of children.   The safety of device for use during pregnancy or delivery has not been established.   Do not place electrodes on front of the throat. This may result in spasms of the laryngeal and pharyngeal muscles.   Do not place the electrodes over the carotid nerve (side of neck below ear).   The device is not effective for pain of central origin (headaches).   The device may interfere with electronic monitoring equipment (such as ECG monitors and ECG alarms).   Electrodes should not be placed over the eyes, in the mouth, or internally.   These devices have no curative value.   TENS devices should be used only under the continued supervision of a physician.   TENS is a symptomatic treatment and as such suppresses the sensation of pain which would otherwise serve as a protective mechanism. Precautions/Adverse Reactions   Isolated cases of skin irritation may occur at the site of electrode placement following long-term application.   Stimulation should be stopped and electrodes removed until the cause of the irritation can be determined.   Effectiveness is highly dependent upon patient selection by a person qualified in the management of pain patients.   If the device treatment becomes ineffective or unpleasant, stimulation should be discontinued until reevaluation by a physician/clinician.   Always turn the device off before applying or removing electrodes.   Skin irritation and electrode burns are potential adverse  reactions.  PURPOSE: A Transcutaneous Electrical Nerve Stimulator, or TENS, unit is designed to relieve post-operative, acute and chronic pain. It is used for pain caused by peripheral nerves and not central. TENS units are prescription-only devices.   OPERATION: TENS units work in a couple  of ways. The first way they are thought to work is by a method called the [employer]. The [employer] states that our brains can only handle one stimulus at a time. When you have chronic pain, this pain signal is constantly being sent to your brain and recognized as pain. When an electrical stimulus is added to the area of pain the body feels this electrical stimulus, and since the brain can only handle one thing at a time, the pain is not transmitted to the brain. The second method thought to be part of TENS unit's success is by way of stimulating our own bodies to release their own natural painkillers. TENS units do not work for everyone and results may vary. Always follow the instructions and warnings in your user's manual.   USE: One of the most important tasks that must be performed is battery maintenance. If you are using a Engineering ge[employer], always fully charge it and fully deplete it before charging it again. These batteries can develop memories and by not performing this charging task correctly, your battery's life can be greatly diminished. If your battery does develop a memory you can help expand the memory by charging for 12 - 13 hours and then completely depleting the battery. Always prepare the skin before applying electrodes. Your skin should be clean and free of any lotions or creams. If you are using electrodes that use conductive gel, apply a small, even layer over the electrode. For carbon, self-adhesive electrodes, apply a drop of water  to the electrodes before applying to the skin. The electrodes attach to the lead wires and then the TENS unit. Always grasp the connector and not the  cord when inserting or removing. When making adjustments, always make sure the unit's channels (1 and 2) are in the OFF position. The actual settings should be recommended and prescribed by your physician. Medical equipment suppliers don'tset or instruct users as to user settings. When you are using the BURST mode, the unit delivers a series of quick pulses followed by a rest. This cycle repeats itself frequently. Always have channels OFF before changing modes.   For MODULATION mode, the stimulation automatically varies the width of the pulse.   For CONVENTIONAL mode, the stimulation is constant. After the settings have been fine-tuned, set the timer to 30 or 60 minutes. Your physician should also prescribe the use time. When the lights become dim, it means your batteries should be replaced or recharged.   ACCESSORIES: The electrodes and lead wires can be obtained from your medical equipment supplier. Your medical equipment supplier can set up a recurring delivery to accommodate your needs. Electrodes should be replaced once a month and lead wires once every 6 months.  Video Tutorial https://youtu.be/[email or web address]?[email or web address]    ______________________________________________________________________

## 2023-11-08 ENCOUNTER — Ambulatory Visit (HOSPITAL_COMMUNITY): Admission: RE | Admit: 2023-11-08 | Discharge: 2023-11-08 | Attending: Pain Medicine | Admitting: Pain Medicine

## 2023-11-08 ENCOUNTER — Ambulatory Visit (HOSPITAL_COMMUNITY)
Admission: RE | Admit: 2023-11-08 | Discharge: 2023-11-08 | Disposition: A | Discharge status: Home / Self Care | Source: Ambulatory Visit | Attending: Pain Medicine | Admitting: Pain Medicine

## 2023-11-08 DIAGNOSIS — M4722 Other spondylosis with radiculopathy, cervical region: Secondary | ICD-10-CM | POA: Diagnosis not present

## 2023-11-08 DIAGNOSIS — Z9889 Other specified postprocedural states: Secondary | ICD-10-CM

## 2023-11-08 DIAGNOSIS — G8929 Other chronic pain: Secondary | ICD-10-CM

## 2023-11-08 DIAGNOSIS — M47812 Spondylosis without myelopathy or radiculopathy, cervical region: Secondary | ICD-10-CM | POA: Diagnosis not present

## 2023-11-08 DIAGNOSIS — M25519 Pain in unspecified shoulder: Secondary | ICD-10-CM | POA: Diagnosis present

## 2023-11-08 DIAGNOSIS — M549 Dorsalgia, unspecified: Secondary | ICD-10-CM | POA: Insufficient documentation

## 2023-11-08 DIAGNOSIS — R519 Headache, unspecified: Secondary | ICD-10-CM | POA: Insufficient documentation

## 2023-11-08 DIAGNOSIS — M961 Postlaminectomy syndrome, not elsewhere classified: Secondary | ICD-10-CM

## 2023-11-08 DIAGNOSIS — M25512 Pain in left shoulder: Secondary | ICD-10-CM | POA: Insufficient documentation

## 2023-11-08 DIAGNOSIS — M85861 Other specified disorders of bone density and structure, right lower leg: Secondary | ICD-10-CM | POA: Diagnosis not present

## 2023-11-08 DIAGNOSIS — M79601 Pain in right arm: Secondary | ICD-10-CM | POA: Diagnosis present

## 2023-11-08 DIAGNOSIS — G4486 Cervicogenic headache: Secondary | ICD-10-CM

## 2023-11-08 DIAGNOSIS — M5410 Radiculopathy, site unspecified: Secondary | ICD-10-CM | POA: Insufficient documentation

## 2023-11-08 DIAGNOSIS — M5116 Intervertebral disc disorders with radiculopathy, lumbar region: Secondary | ICD-10-CM | POA: Diagnosis not present

## 2023-11-08 DIAGNOSIS — M4726 Other spondylosis with radiculopathy, lumbar region: Secondary | ICD-10-CM | POA: Diagnosis not present

## 2023-11-08 DIAGNOSIS — M25562 Pain in left knee: Secondary | ICD-10-CM | POA: Insufficient documentation

## 2023-11-08 DIAGNOSIS — M542 Cervicalgia: Secondary | ICD-10-CM | POA: Diagnosis not present

## 2023-11-08 DIAGNOSIS — I700 Atherosclerosis of aorta: Secondary | ICD-10-CM | POA: Diagnosis not present

## 2023-11-08 DIAGNOSIS — M25511 Pain in right shoulder: Secondary | ICD-10-CM | POA: Insufficient documentation

## 2023-11-08 DIAGNOSIS — M4802 Spinal stenosis, cervical region: Secondary | ICD-10-CM | POA: Diagnosis not present

## 2023-11-08 DIAGNOSIS — M25561 Pain in right knee: Secondary | ICD-10-CM | POA: Insufficient documentation

## 2023-11-08 DIAGNOSIS — M47816 Spondylosis without myelopathy or radiculopathy, lumbar region: Secondary | ICD-10-CM | POA: Diagnosis not present

## 2023-11-08 DIAGNOSIS — M5412 Radiculopathy, cervical region: Secondary | ICD-10-CM | POA: Diagnosis present

## 2023-11-08 DIAGNOSIS — M79604 Pain in right leg: Secondary | ICD-10-CM | POA: Diagnosis not present

## 2023-11-08 DIAGNOSIS — M5417 Radiculopathy, lumbosacral region: Secondary | ICD-10-CM

## 2023-11-08 DIAGNOSIS — M79605 Pain in left leg: Secondary | ICD-10-CM | POA: Insufficient documentation

## 2023-11-08 DIAGNOSIS — M85812 Other specified disorders of bone density and structure, left shoulder: Secondary | ICD-10-CM | POA: Diagnosis not present

## 2023-11-08 DIAGNOSIS — M79602 Pain in left arm: Secondary | ICD-10-CM | POA: Insufficient documentation

## 2023-11-08 DIAGNOSIS — I7143 Infrarenal abdominal aortic aneurysm, without rupture: Secondary | ICD-10-CM | POA: Diagnosis not present

## 2023-11-08 DIAGNOSIS — M19012 Primary osteoarthritis, left shoulder: Secondary | ICD-10-CM | POA: Diagnosis not present

## 2023-11-08 DIAGNOSIS — M48061 Spinal stenosis, lumbar region without neurogenic claudication: Secondary | ICD-10-CM | POA: Diagnosis not present

## 2023-11-08 DIAGNOSIS — M19011 Primary osteoarthritis, right shoulder: Secondary | ICD-10-CM | POA: Diagnosis not present

## 2023-11-12 LAB — COMPLIANCE DRUG ANALYSIS, UR

## 2023-11-13 ENCOUNTER — Encounter: Payer: Self-pay | Admitting: Radiology

## 2023-11-14 ENCOUNTER — Telehealth: Payer: Self-pay

## 2023-11-14 NOTE — Telephone Encounter (Signed)
 Pt and his wife phoned regarding pt has been having diarrhea 2 or 3 days . His anal area is raw and he is wanting something phoned in to his pharmacy for this issue. Last ov was 06/08/2023 and he was suppose to follow up in 6 weeks. Does this pt need a ov. Please advise

## 2023-11-15 MED ORDER — HYDROCORTISONE (PERIANAL) 2.5 % EX CREA: 1.0000 | TOPICAL_CREAM | Freq: Two times a day (BID) | CUTANEOUS | 1 refills | Status: AC

## 2023-11-15 NOTE — Addendum Note (Signed)
 Addended by: SHIRLEAN THERISA ORN on: 11/15/2023 01:17 PM   Modules accepted: Orders

## 2023-11-15 NOTE — Telephone Encounter (Signed)
 Phoned and advised the pt of the Rx / directions being sent to his pharmacy. Pt is already scheduled

## 2023-11-15 NOTE — Telephone Encounter (Signed)
 I sent in Anusol  rectal cream to use twice a day per rectum as needed.  Yes, needs office visit. Thanks!

## 2023-11-21 ENCOUNTER — Other Ambulatory Visit (HOSPITAL_COMMUNITY)
Admission: RE | Admit: 2023-11-21 | Discharge: 2023-11-21 | Disposition: A | Discharge status: Home / Self Care | Source: Ambulatory Visit | Attending: Pain Medicine | Admitting: Pain Medicine

## 2023-11-21 DIAGNOSIS — M899 Disorder of bone, unspecified: Secondary | ICD-10-CM | POA: Diagnosis not present

## 2023-11-21 DIAGNOSIS — G8929 Other chronic pain: Secondary | ICD-10-CM | POA: Insufficient documentation

## 2023-11-21 DIAGNOSIS — Z79899 Other long term (current) drug therapy: Secondary | ICD-10-CM | POA: Insufficient documentation

## 2023-11-21 LAB — VITAMIN D 25 HYDROXY (VIT D DEFICIENCY, FRACTURES): Vit D, 25-Hydroxy: 42.63 ng/mL (ref 30–100)

## 2023-11-21 LAB — C-REACTIVE PROTEIN: CRP: 0.6 mg/dL (ref <1.0)

## 2023-11-21 LAB — MAGNESIUM: Magnesium: 1.7 mg/dL (ref 1.7–2.4)

## 2023-11-21 LAB — VITAMIN B12: Vitamin B-12: 503 pg/mL (ref 180–914)

## 2023-11-21 LAB — SEDIMENTATION RATE: Sed Rate: 11 mm/h (ref 0–20)

## 2023-11-28 DIAGNOSIS — F410 Panic disorder [episodic paroxysmal anxiety] without agoraphobia: Secondary | ICD-10-CM | POA: Diagnosis not present

## 2023-11-28 DIAGNOSIS — N1831 Chronic kidney disease, stage 3a: Secondary | ICD-10-CM | POA: Diagnosis not present

## 2023-11-28 DIAGNOSIS — I2510 Atherosclerotic heart disease of native coronary artery without angina pectoris: Secondary | ICD-10-CM | POA: Diagnosis not present

## 2023-11-28 DIAGNOSIS — Z8673 Personal history of transient ischemic attack (TIA), and cerebral infarction without residual deficits: Secondary | ICD-10-CM | POA: Diagnosis not present

## 2023-12-01 ENCOUNTER — Ambulatory Visit (HOSPITAL_COMMUNITY): Attending: Orthopedic Surgery

## 2023-12-01 ENCOUNTER — Telehealth (HOSPITAL_COMMUNITY): Payer: Self-pay

## 2023-12-01 NOTE — Telephone Encounter (Signed)
 Pt was called because he no-showed today's evaluation. PT left a VM notifying him of his missed appointment and to contact the facility to reschedule or close his referral.   Lacinda Fass, PT, DPT

## 2023-12-10 DIAGNOSIS — R937 Abnormal findings on diagnostic imaging of other parts of musculoskeletal system: Secondary | ICD-10-CM | POA: Insufficient documentation

## 2023-12-10 NOTE — Progress Notes (Unsigned)
 PROVIDER NOTE: Interpretation of information contained herein should be left to medically-trained personnel. Specific patient instructions are provided elsewhere under Patient Instructions section of medical record. This document was created in part using AI and STT-dictation technology, any transcriptional errors that may result from this process are unintentional.  Patient: Alex Lara.  Service: E/M   PCP: Sheryle Carwin, MD  DOB: 26-Jun-1940  DOS: 12/11/2023  Provider: Eric DELENA Como, MD  MRN: [id number]  Delivery: Face-to-face  Specialty: Interventional Pain Management  Type: Established Patient  Setting: Ambulatory outpatient facility  Specialty designation: 09  Referring Prov.: Sheryle Carwin, MD  Location: Outpatient office facility       Primary Reason(s) for Visit: Encounter for evaluation before starting new chronic pain management plan of care (Level of risk: moderate) CC: No chief complaint on file.  HPI  Alex Lara is a 83 y.o. year old, male patient, who comes today for a follow-up evaluation to review the test results and decide on a treatment plan. He has Osteoarthritis; Coronary artery disease involving native coronary artery of native heart without angina pectoris; Hyperlipidemia; Tobacco abuse; Deep vein thrombosis (HCC); Gastroesophageal reflux disease; Hypertension; Chest tightness; Dyspnea on exertion; Malignant neoplasm of prostate (HCC); Stroke-like symptoms; Stroke [facility]); Benign prostatic hyperplasia with urinary obstruction; Depression with anxiety; Erectile dysfunction due to arterial insufficiency; Weak urinary stream; Rectal pain; Bradycardia; Hypotension; Aortic aneurysm; AKI (acute kidney injury); Hemorrhoids; Grade II hemorrhoids; Prolapsed internal hemorrhoids, grade 3; Constipation; Dysphagia; Chronic pain syndrome; Pharmacologic therapy; Disorder of skeletal system; Problems influencing health status; Chronic neck and back pain (1ry area of Pain) (Bilateral);  Cervicalgia; Neck pain of over 3 months duration; Neck pain (Bilateral); Posterior neck pain; Neck and shoulder pain; Chronic shoulder pain (2ry area of Pain) (Bilateral); Radicular pain of shoulder (Bilateral); Chronic lower extremity pain (3ry area of Pain) (Bilateral) (L>R); Chronic radicular pain of lower extremity (Bilateral); Lumbosacral radiculopathy at L5 (Right); Lumbosacral radiculopathy at S1 (Left); Failed back surgical syndrome; History of back surgery; Cervicogenic headache (Bilateral); Occipital headache (GON) (Bilateral); Cervical facet joint pain (Bilateral); Chronic knee pain (Bilateral); Chronic upper extremity pain (Bilateral); Musculoskeletal disorder involving upper trapezius muscle (Bilateral); Abnormal MRI, cervical spine (11/12/2023); and Abnormal MRI, lumbar spine (11/12/2023) on their problem list. His primarily concern today is the No chief complaint on file.  Pain Assessment: Location:     Radiating:   Onset:   Duration:   Quality:   Severity:  /10 (subjective, self-reported pain score)  Effect on ADL:   Timing:   Modifying factors:   BP:    HR:    Alex Lara comes in today for a follow-up visit after his initial evaluation on 11/02/2023. Today we went over the results of his tests. These were explained in Layman's terms. During today's appointment we went over my diagnostic impression, as well as the proposed treatment plan.  Review of initial evaluation (11/02/2023): Alex Lara. [name] is an 83 year old male with spinal stenosis who presents with chronic neck and shoulder pain. Referred by Dr. Margrette, an orthopedic specialist, for evaluation of spinal stenosis and pain management.   He experiences chronic neck pain radiating to both shoulders, with the most severe pain in the neck. The pain is constant and accompanied by headaches radiating from the back of the head to the top. He applies a lidocaine  cream four times daily, which provides some relief. He  has not undergone neck surgeries, injections, or physical therapy for this pain.   He  has bilateral shoulder pain, with the left shoulder being more painful. The left arm pain is constant, extending over the biceps to the elbow, without numbness or weakness. The right arm has intermittent pain extending to the elbow, causing nocturnal numbness in all fingers. He underwent successful carpal tunnel surgery on the left hand over ten years ago and maintains dexterity in both hands.   He experiences lower back pain radiating down the left leg to the little toe and the right leg to the big toe. He had L5 disc surgery over thirty years ago, involving disc material removal. The leg pain is constant, with the left leg being worse. He received cortisone injections in both knees and shoulders, with recent shoulder injections last week and knee injections last month, but reports no improvement in knee pain.  Review of diagnostic test ordered on 11/02/2023:  Diagnostic lab work: *** Diagnostic imaging: ***  Discussed the use of AI scribe software for clinical note transcription with the patient, who gave verbal consent to proceed.  History of Present Illness          Patient presented with interventional treatment options. Mr. Earwood was informed that I will not be providing medication management. Pharmacotherapy evaluation including recommendations may be offered, if specifically requested.   Controlled Substance Pharmacotherapy Assessment REMS (Risk Evaluation and Mitigation Strategy)  Opioid Analgesic:  tramadol  50 mg tablet, 1 tab p.o. 3 times daily (90/month) (last filled on 10/04/2023) MME/day: 30.0 mg/day   Pill Count: None expected due to no prior prescriptions written by our practice. No notes on file  Pharmacokinetics: Liberation and absorption (onset of action): WNL Distribution (time to peak effect): WNL Metabolism and excretion (duration of action): WNL         Pharmacodynamics: Desired  effects: Analgesia: Mr. Bilotta reports >50% benefit. Functional ability: Patient reports that medication allows him to accomplish basic ADLs Clinically meaningful improvement in function (CMIF): Sustained CMIF goals met Perceived effectiveness: Described as relatively effective, allowing for increase in activities of daily living (ADL) Undesirable effects: Side-effects or Adverse reactions: None reported Monitoring: [place] PMP: PDMP reviewed during this encounter. Online review of the past 54-month period previously conducted. Not applicable at this point since we have not taken over the patient's medication management yet. List of other Serum/Urine Drug Screening Test(s):  Lab Results  Component Value Date   COCAINSCRNUR NONE DETECTED 01/30/2019   THCU NONE DETECTED 01/30/2019   ETH <10 01/30/2019   List of all UDS test(s) done:  Lab Results  Component Value Date   SUMMARY FINAL 11/02/2023   Last UDS on record: Summary  Date Value Ref Range Status  11/02/2023 FINAL  Final    Comment:    ==================================================================== Compliance Drug Analysis, Ur ==================================================================== Test                             Result       Flag       Units  Drug Present and Declared for Prescription Verification   7-aminoclonazepam              39           EXPECTED   ng/mg creat    7-aminoclonazepam is an expected metabolite of clonazepam . Source of    clonazepam  is a scheduled prescription medication.    Tramadol   1443         EXPECTED   ng/mg creat   O-Desmethyltramadol            1670         EXPECTED   ng/mg creat   N-Desmethyltramadol            326          EXPECTED   ng/mg creat    Source of tramadol  is a prescription medication. O-desmethyltramadol    and N-desmethyltramadol are expected metabolites of tramadol .    Gabapentin                      PRESENT      EXPECTED   Ibuprofen                       PRESENT      EXPECTED   Lidocaine                       PRESENT      EXPECTED  Drug Present not Declared for Prescription Verification   Sertraline                      PRESENT      UNEXPECTED   Desmethylsertraline            PRESENT      UNEXPECTED    Desmethylsertraline is an expected metabolite of sertraline .    Dextromethorphan               PRESENT      UNEXPECTED   Dextrorphan/Levorphanol        PRESENT      UNEXPECTED    Dextrorphan is an expected metabolite of dextromethorphan, an over-    the-counter or prescription cough suppressant. Dextrorphan cannot be    distinguished from the scheduled prescription medication levorphanol    by the method used for analysis.  Drug Absent but Declared for Prescription Verification   Acetaminophen                   Not Detected UNEXPECTED    Acetaminophen , as indicated in the declared medication list, is not    always detected even when used as directed.    Salicylate                     Not Detected UNEXPECTED    Aspirin , as indicated in the declared medication list, is not always    detected even when used as directed.    Promethazine                    Not Detected UNEXPECTED ==================================================================== Test                      Result    Flag   Units      Ref Range   Creatinine              111              mg/dL      >=79 ==================================================================== Declared Medications:  The flagging and interpretation on this report are based on the  following declared medications.  Unexpected results may arise from  inaccuracies in the declared medications.   **Note: The testing scope of this panel includes these medications:   Clonazepam  (Klonopin )  Gabapentin  (Neurontin )  Promethazine   Tramadol  (Ultram )   **  Note: The testing scope of this panel does not include small to  moderate amounts of these reported medications:   Acetaminophen  (Tylenol )   Aspirin   Ibuprofen  Topical Lidocaine  (LMX)   **Note: The testing scope of this panel does not include the  following reported medications:   Albuterol  (Ventolin  HFA)  Atorvastatin  (Lipitor)  Calcium   Fluticasone  (Arnuity Ellipta)  Ipratropium (Atrovent)  Losartan (Cozaar)  Lubiprostone  (Amitiza )  Melatonin  Pantoprazole  (Protonix )  Vitamin D  ==================================================================== For clinical consultation, please call [phone]. ====================================================================    UDS interpretation: No unexpected findings.          Medication Assessment Form: Not applicable. No opioids. Treatment compliance: Not applicable Risk Assessment Profile: Aberrant behavior: See initial evaluations. None observed or detected today Comorbid factors increasing risk of overdose: See initial evaluation. No additional risks detected today Opioid risk tool (ORT):     11/02/2023   12:42 PM  Opioid Risk   Alcohol 0  Illegal Drugs 0  Rx Drugs 0  Alcohol 0  Illegal Drugs 0  Rx Drugs 0  Age between 16-45 years  0  History of Preadolescent Sexual Abuse 0  Psychological Disease 0  Depression 0  Opioid Risk Tool Scoring 0  Opioid Risk Interpretation Low Risk    ORT Scoring interpretation table:  Score <3 = Low Risk for SUD  Score between 4-7 = Moderate Risk for SUD  Score >8 = High Risk for Opioid Abuse   Risk of substance use disorder (SUD): Low  Risk Mitigation Strategies:  Patient opioid safety counseling: No controlled substances prescribed. Patient-Prescriber Agreement (PPA): No agreement signed.  Controlled substance notification to other providers: None required. No opioid therapy.  Pharmacologic Plan: Non-opioid analgesic therapy offered. Interventional alternatives discussed.             Laboratory Chemistry Profile   Renal Lab Results  Component Value Date   BUN 26 (H) 10/17/2023   CREATININE 1.17 10/17/2023    GFRAA >60 01/30/2019   GFRNONAA >60 10/17/2023   SPECGRAV 1.020 03/09/2023   PHUR 6.0 03/09/2023   PROTEINUR NEGATIVE 10/17/2023     Electrolytes Lab Results  Component Value Date   NA 139 10/17/2023   K 3.6 10/17/2023   CL 105 10/17/2023   CALCIUM  9.2 10/17/2023   MG 1.7 11/21/2023   PHOS 3.2 08/16/2021     Hepatic Lab Results  Component Value Date   AST 65 (H) 10/17/2023   ALT 22 10/17/2023   ALBUMIN 4.5 10/17/2023   ALKPHOS 70 10/17/2023   LIPASE 36 08/03/2023     ID Lab Results  Component Value Date   SARSCOV2NAA NEGATIVE 10/05/2023     Bone Lab Results  Component Value Date   VD25OH 42.63 11/21/2023     Endocrine Lab Results  Component Value Date   GLUCOSE 109 (H) 10/17/2023   GLUCOSEU NEGATIVE 10/17/2023   HGBA1C 5.5 01/31/2019   TSH 1.053 08/16/2021     Neuropathy Lab Results  Component Value Date   VITAMINB12 503 11/21/2023   FOLATE 6.9 08/18/2021   HGBA1C 5.5 01/31/2019     CNS No results found for: COLORCSF, APPEARCSF, RBCCOUNTCSF, WBCCSF, POLYSCSF, LYMPHSCSF, EOSCSF, PROTEINCSF, GLUCCSF, JCVIRUS, CSFOLI, IGGCSF, LABACHR, ACETBL   Inflammation (CRP: Acute  ESR: Chronic) Lab Results  Component Value Date   CRP 0.6 11/21/2023   ESRSEDRATE 11 11/21/2023   LATICACIDVEN 1.2 08/16/2021     Rheumatology No results found for: RF, ANA, LABURIC, URICUR, LYMEIGGIGMAB, LYMEABIGMQN, HLAB27   Coagulation Lab  Results  Component Value Date   INR 1.1 08/16/2021   LABPROT 13.9 08/16/2021   APTT 29 08/16/2021   PLT 146 (L) 10/17/2023     Cardiovascular Lab Results  Component Value Date   BNP 63.9 08/17/2021   HGB 11.9 (L) 10/17/2023   HCT 36.0 (L) 10/17/2023     Screening Lab Results  Component Value Date   SARSCOV2NAA NEGATIVE 10/05/2023     Cancer No results found for: CEA, CA125, LABCA2   Allergens No results found for: ALMOND, APPLE, ASPARAGUS, AVOCADO, BANANA, BARLEY,  BASIL, BAYLEAF, GREENBEAN, LIMABEAN, WHITEBEAN, BEEFIGE, REDBEET, BLUEBERRY, BROCCOLI, CABBAGE, MELON, CARROT, CASEIN, CASHEWNUT, CAULIFLOWER, CELERY     Note: Lab results reviewed.  Recent Diagnostic Imaging Review  Cervical Imaging: Cervical MR wo contrast: Results for orders placed during the hospital encounter of 11/08/23 MR CERVICAL SPINE WO CONTRAST  Narrative EXAM: MRI CERVICAL SPINE WITHOUT CONTRAST 11/08/2023 06:14:20 PM  TECHNIQUE: Multiplanar multisequence MRI of the cervical spine was performed without the administration of intravenous contrast.  COMPARISON: Radiographs 11/08/2023.  CLINICAL HISTORY: Cervical radiculopathy, chronic neck pain.  FINDINGS:  BONES AND ALIGNMENT: Mildly exaggerated cervical lordosis. 1.5 mm posterior subluxation at C3-C4. Normal vertebral body heights. Chronic mild superior endplate compression at T1. Borderline low T1 signal in the T2 vertebral body but without accentuated T2 signal, probably incidental. Background marrow signal is unremarkable unless otherwise stated. No abnormal enhancement.  SPINAL CORD: Normal spinal cord size. Normal spinal cord signal.  SOFT TISSUES: No paraspinal mass.  C2-C3: Moderate left foraminal stenosis due to facet arthropathy and mild uncinate spurring.  C3-C4: Prominent left and moderate right foraminal stenosis due to uncinate spurring and mild disc bulge.  C4-C5: Prominent right foraminal stenosis and borderline central stenosis due to uncinate facet spurring along with disc bulge.  C5-C6: No impingement. Degenerative left facet arthropathy.  C6-C7: No significant disc herniation. No spinal canal stenosis or neural foraminal narrowing.  C7-T1: No impingement. Bilateral degenerative facet arthropathy.  T1-T2: Moderate right foraminal stenosis due to facet spurring and uncinate spurring. Minimal disc bulge.  General Cervical Spine: Mild disc  desiccation throughout the cervical spine.  IMPRESSION: 1. C3-4: prominent left and moderate right foraminal stenosis due to uncinate spurring and mild disc bulge. 2. C4-5: prominent right foraminal stenosis and borderline central stenosis due to uncinate facet spurring along with disc bulge. 3. C2-3: moderate left foraminal stenosis due to facet arthropathy and mild uncinate spurring. 4. T1-2: moderate right foraminal stenosis due to facet spurring and uncinate spurring, with minimal disc bulge.  Electronically signed by: Ryan Salvage MD 11/12/2023 02:12 PM EST RP Workstation: [id number]  Cervical DG Bending/F/E views: Results for orders placed during the hospital encounter of 11/08/23 DG Cervical Spine With Flex & Extend  Narrative CLINICAL DATA:  Cervicalgia, chronic neck pain  EXAM: DG CERVICAL SPINE COMP WITH FLEX  COMPARISON:  03/15/2021  FINDINGS: Normal alignment and prevertebral soft tissues. Similar multilevel cervical spine degenerative changes spanning C3 through C6. Endplate sclerosis and bony spurring noted. Mild multilevel facet arthropathy posteriorly. Facets are aligned. Trachea midline. Lung apices are clear. Aortic and carotid calcifications noted.  IMPRESSION: Similar cervical spine degenerative changes as above. No acute finding by plain radiography.   Electronically Signed By: CHRISTELLA.  Shick M.D. On: 11/10/2023 09:48  Shoulder Imaging: Shoulder-L MR wo contrast: Results for orders placed during the hospital encounter of 03/19/15 MR Shoulder Left Wo Contrast  Narrative CLINICAL DATA:  Pain and weakness about the left shoulder for 8 months. No  known injury. Initial encounter.  EXAM: MRI OF THE LEFT SHOULDER WITHOUT CONTRAST  TECHNIQUE: Multiplanar, multisequence MR imaging of the shoulder was performed. No intravenous contrast was administered.  COMPARISON:  None.  FINDINGS: Rotator cuff: There is rotator cuff tendinopathy. The patient  has a partial width, full-thickness tear of the supraspinatus measuring approximately 1.3 cm from front to back with retraction of 1-2 cm. The rotator cuff is otherwise intact.  Muscles:  No atrophy or focal lesion.  Biceps long head: The tendon is completely torn from the superior labrum and retracted approximately 5 cm below the top of the humeral head.  Acromioclavicular Joint:  Bulky degenerative change is seen.  Glenohumeral Joint: Unremarkable.  Labrum:  Intact.  Bones: No fracture or worrisome marrow lesion. There is some subacromial spurring. Fluid is present in the subacromial/subdeltoid bursa.  IMPRESSION: Rotator cuff tendinopathy with a partial width, full-thickness tear of the anterior and far lateral supraspinatus measuring approximately 1.3 cm from front to back with 1-2 cm of retraction. No atrophy.  Complete tear of the long head of biceps from the superior labrum.  Bulky acromioclavicular osteoarthritis. Subacromial spur also identified.  Subacromial/subdeltoid bursitis.   Electronically Signed By: Debby Prader M.D. On: 03/19/2015 16:40  Shoulder-R DG: Results for orders placed during the hospital encounter of 11/08/23 DG Shoulder Right  Narrative EXAM: 1 VIEW XRAY OF THE RIGHT SHOULDER 11/08/2023 04:40:34 PM  COMPARISON: 11/15/2021  CLINICAL HISTORY: Right shoulder pain. Neck pain over 3 months ; Chronic pain of both shoulders; Chronic bilateral pain of lower extremities; Failed surgical syndrome; Lumbosacral radiculopathy  FINDINGS:  BONES AND JOINTS: Glenohumeral joint is normally aligned. No acute fracture or dislocation. The [facility] joint shows mild acromioclavicular degenerative changes.  SOFT TISSUES: Axillary artery calcifications are noted. Visualized lung is unremarkable.  IMPRESSION: 1. No acute findings. 2. Mild acromioclavicular osteoarthritis.  Electronically signed by: Elsie Perone MD 11/10/2023 09:38 AM EDT  RP Workstation: [id number]  Shoulder-L DG: Results for orders placed during the hospital encounter of 11/08/23 DG Shoulder Left  Narrative CLINICAL DATA:  Left shoulder pain  EXAM: LEFT SHOULDER - 2+ VIEW  COMPARISON:  11/08/2023  FINDINGS: Bones are osteopenic. No acute osseous finding, fracture, or malalignment. Similar mild degenerate arthropathy of the left AC joint and glenohumeral joint. Included left chest unremarkable. Aorta atherosclerotic. Degenerative changes throughout the spine.  IMPRESSION: Osteopenia and degenerative changes. No acute finding by plain radiography.   Electronically Signed By: CHRISTELLA.  Shick M.D. On: 11/10/2023 09:41  Thoracic Imaging: Thoracic DG w/swimmers view: Results for orders placed during the hospital encounter of 07/28/21 [facility] Thoracic Spine W/Swimmers  Narrative CLINICAL DATA:  Back pain.  Mid back pain and weakness.  EXAM: THORACIC SPINE - 3 VIEWS  COMPARISON:  No prior thoracic spine exams. Cervical spine radiograph 03/15/2021. Lower thoracic spine reformats from abdominal CT 03/23/2018  FINDINGS: Exaggerated upper thoracic kyphosis. Known T11 compression fracture is grossly stable. There additional mild compression fractures of tentatively number T4, T5, and T9. Multilevel anterior spurring with slight diffuse disc space narrowing. No definite paravertebral soft tissue abnormalities.  IMPRESSION: 1. Chronic compression deformities at T11, also T4 and T5, stable from prior imaging. 2. Mild T9 compression fracture with no comparisons to assess for acuity. 3. Diffuse degenerative disc disease.   Electronically Signed By: Andrea Gasman M.D. On: 07/28/2021 18:55  Lumbosacral Imaging: Lumbar MR wo contrast: Results for orders placed during the hospital encounter of 11/08/23 MR LUMBAR SPINE WO CONTRAST  Narrative EXAM: MRI LUMBAR  SPINE 11/08/2023 06:12:44 PM  TECHNIQUE: Multiplanar multisequence MRI of the lumbar  spine was performed without the administration of intravenous contrast.  COMPARISON: Lumbar spine radiographs 11/08/2023, lumbar CT from 09/24/2023, and rib radiographs from 09/15/2022.  CLINICAL HISTORY: Low back pain, lumbar radiculopathy, symptoms persist with greater than 6 weeks treatment.  FINDINGS:  BONES AND ALIGNMENT: Normal alignment. Normal vertebral body heights, except for L5 which is partially sacralized. 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 This represents a change in numbering compared to the prior lumbar spine CT from 09/24/2023. Careful correlation with this numbering strategy is recommended if procedural intervention is to be performed. Bone marrow signal: Fatty marrow at L5 and in the bony sacrum favoring prior regional radiation therapy. Acquired interbody fusion at L1-L2. Disc desiccation in the remainder of the lumbar intervertebral disc spaces with loss of disc height at L4-L5. 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  SPINAL CORD: Normal appearing conus medullaris terminates at the T12 level.  SOFT TISSUES: No paraspinal mass. Fatty atrophy of the left iliacus and left psoas muscles.  T12-L1: Disc bulge on right foraminal line in the arterial. No impingement.  L1-L2: Acquired interbody fusion. No impingement.  L2-L3: Moderate right subarticular lateral recess stenosis and borderline right foraminal stenosis due to facet arthropathy, disc bulge, and mild intervertebral spurring.  L3-L4: Moderate central stenosis and moderate displacement of the right L3 nerve in the lateral extraforaminal space due to disc bulge, intervertebral spurring, and facet arthropathy.  L4-L5: Moderate left foraminal stenosis and moderate displacement of the left L4 nerve and the lateral extraforaminal space due to intervertebral spurring and facet arthropathy. Prior right laminectomy at L4.  L5-S1: Rudimentary disc material below the somewhat sacralized L5 vertebral body.  No impingement.  INCIDENTAL FINDINGS: Bilateral fluid signal intensity renal cysts warrant no further imaging workup. Infrarenal abdominal aortic aneurysm 3.7 cm anterior to posterior dimension less suspected internal mural thrombus.  IMPRESSION: 1. Moderate central stenosis and moderate displacement of the right L3 nerve in the lateral extraforaminal space at L3-4 due to disc bulge, intervertebral spurring, and facet arthropathy. 2. Moderate left foraminal stenosis and moderate displacement of the left L4 nerve in the lateral extraforaminal space at L4-5 due to intervertebral spurring and facet arthropathy. Prior right laminectomy at L4. 3. Moderate right subarticular lateral recess stenosis and borderline right foraminal stenosis at L2-3 due to facet arthropathy, disc bulge, and mild intervertebral spurring. 4. Please note that the L5 vertebra is sacralized and the T12 ribs are hypoplastic. Careful correlation with the numbering strategy is recommended if procedural intervention is to be performed. 5. Infrarenal abdominal aortic aneurysm measuring 3.7 cm in anterior to posterior dimension with suspected internal mural thrombus. Recommendation is surveillance with a 2-year nterval.  Electronically signed by: Ryan Salvage MD 11/12/2023 02:05 PM EST RP Workstation: [id number]  Lumbar DG Bending views: Results for orders placed during the hospital encounter of 11/08/23 DG Lumbar Spine Complete W/Bend  Narrative CLINICAL DATA:  Low back pain  EXAM: LUMBAR SPINE - COMPLETE WITH BENDING VIEWS  COMPARISON:  07/28/2021  FINDINGS: Similar slight levocurvature on the frontal view. Preserved vertebral body heights. No acute compression fracture. Slight worsening of multilevel lower thoracic and lumbar degenerative changes diffusely. Facets are aligned. No instability with flexion extension. Aorta atherosclerotic. Similar 4 cm AAA by plain radiography.  IMPRESSION: 1. Slight  worsening of multilevel lower thoracic and lumbar degenerative changes. 2. No acute finding by plain radiography. 3. Similar 4 cm AAA.   Electronically Signed By: CHRISTELLA.  Shick  M.D. On: 11/10/2023 09:46  Hip Imaging: Hip-L DG 2-3 views: Results for orders placed in visit on 12/19/22 DG HIP UNILAT WITH PELVIS 2-3 VIEWS LEFT  Narrative Multiple views left hip  Status post femoral nailing for what was either a hip fracture or femur fracture in any event there is a cephalic medullary nail making me think this was for inotrope fracture there is also an abnormality near the area just proximal to the midshaft which may have been the fracture site as well.  In any event no arthritis in the hip joint  Impression healed fracture left femur/hip no problems related to the nail.  We do note femoral artery calcifications  Knee Imaging: Knee-R DG 4 views: Results for orders placed during the hospital encounter of 11/08/23 DG Knee Complete 4 Views Right  Narrative CLINICAL DATA:  Right knee pain/arthralgia  EXAM: DG KNEE COMPLETE 4+V*R*  COMPARISON:  06/21/2016  FINDINGS: Bones are osteopenic. Moderately severe right knee degenerative arthropathy with chondrocalcinosis in the medial and lateral compartments. Degenerative changes have progressed compared to 2018. Joint space loss and bony spurring noted. No acute osseous finding, fracture, malalignment or large effusion. Patella is located. Progressive peripheral atherosclerotic changes.  IMPRESSION: 1. Osteopenia and worsening degenerative changes as above. 2. No acute finding by plain radiography.   Electronically Signed By: CHRISTELLA.  Shick M.D. On: 11/10/2023 09:43  Knee-L DG 4 views: Results for orders placed during the hospital encounter of 11/08/23 DG Knee Complete 4 Views Left  Narrative CLINICAL DATA:  Left knee pain/arthralgia  EXAM: LEFT KNEE - COMPLETE 4+ VIEW  COMPARISON:  11/08/2023  FINDINGS: Moderately severe left  knee tricompartmental degenerative arthropathy with chondrocalcinosis. No acute osseous finding, fracture, malalignment or large effusion. Soft tissues unremarkable. Remote left femur intramedullary rod, incompletely imaged. Calcified peripheral atherosclerosis noted.  IMPRESSION: 1. Moderately severe left knee tricompartmental degenerative arthropathy. 2. No acute finding by plain radiography.   Electronically Signed By: CHRISTELLA.  Shick M.D. On: 11/10/2023 09:44  Foot Imaging: Foot-L DG Complete: Results for orders placed in visit on 12/19/22 DG Foot Complete Left  Narrative Complains of left foot pain  X-rays show squaring off of the first metatarsal consistent with osteoarthritis no major osteophytes are noted.  The small toe also is visualized as well where the pain was there is no obvious problems in that area  Impression osteoarthritis great toe left foot  Wrist Imaging: Wrist-L DG Complete: Results for orders placed in visit on 10/29/00 DG Wrist Complete Left  Narrative FINDINGS CLINICAL DATA:  CAT BITE, HAND SWELLING. LEFT HAND COMPLETE 10/29/00 THERE IS GAS SEEN WITHIN THE SOFT TISSUES OF THE ANTERIOR THUMB.  ADJACENT TO THIS SOFT TISSUE GAS, THERE IS A SLIGHTLY RADIOPAQUE DENSITY SEEN WHICH COULD REPRESENT A MINIMALLY RADIOPAQUE FOREIGN BODY.  UNDERLYING BONES ARE UNREMARKABLE.  NO FRACTURE, SUBLUXATION, DISLOCATION OR EVIDENCE OF OSTEOMYELITIS. IMPRESSION SOFT TISSUE GAS IN THE LEFT THUMB.  THERE MAY BE MINIMALLY RADIOPAQUE FOREIGN BODY IN THE SOFT TISSUES AS WELL. LEFT WRIST TWO VIEWS 10/29/00 NO ACUTE BONE ABNORMALITY IS SEEN.  SPECIFICALLY, THERE IS NO EVIDENCE OF FRACTURE, SUBLUXATION OR DISLOCATION.  NO EVIDENCE OF OSTEOMYELITIS. IMPRESSION NO ACUTE BONY ABNORMALITY.  Hand Imaging: Hand-L DG Complete: Results for orders placed in visit on 10/29/00 DG Hand Complete Left  Narrative FINDINGS CLINICAL DATA:  CAT BITE, HAND SWELLING. LEFT HAND COMPLETE  10/29/00 THERE IS GAS SEEN WITHIN THE SOFT TISSUES OF THE ANTERIOR THUMB.  ADJACENT TO THIS SOFT TISSUE GAS, THERE IS A SLIGHTLY  RADIOPAQUE DENSITY SEEN WHICH COULD REPRESENT A MINIMALLY RADIOPAQUE FOREIGN BODY.  UNDERLYING BONES ARE UNREMARKABLE.  NO FRACTURE, SUBLUXATION, DISLOCATION OR EVIDENCE OF OSTEOMYELITIS. IMPRESSION SOFT TISSUE GAS IN THE LEFT THUMB.  THERE MAY BE MINIMALLY RADIOPAQUE FOREIGN BODY IN THE SOFT TISSUES AS WELL. LEFT WRIST TWO VIEWS 10/29/00 NO ACUTE BONE ABNORMALITY IS SEEN.  SPECIFICALLY, THERE IS NO EVIDENCE OF FRACTURE, SUBLUXATION OR DISLOCATION.  NO EVIDENCE OF OSTEOMYELITIS. IMPRESSION NO ACUTE BONY ABNORMALITY.  Complexity Note: Imaging results reviewed.                         Meds   Current Outpatient Medications:    acetaminophen  (TYLENOL ) 500 MG tablet, Take 500 mg by mouth every 6 (six) hours as needed., Disp: , Rfl:    albuterol  (VENTOLIN  HFA) 108 (90 Base) MCG/ACT inhaler, Inhale 2 puffs into the lungs every 4 (four) hours as needed for wheezing or shortness of breath., Disp: , Rfl:    ARNUITY ELLIPTA 100 MCG/ACT AEPB, Inhale 1 puff into the lungs daily., Disp: , Rfl:    aspirin  EC 325 MG tablet, Take 325 mg by mouth daily., Disp: , Rfl:    atorvastatin  (LIPITOR) 40 MG tablet, Take 1 tablet (40 mg total) by mouth daily at 6 PM., Disp: 30 tablet, Rfl: 3   Calcium  Carbonate-Vit D-Min (CALCIUM  1200 PO), Take by mouth daily., Disp: , Rfl:    clonazePAM  (KLONOPIN ) 0.5 MG tablet, Take 0.5 mg by mouth 3 (three) times daily as needed for anxiety., Disp: , Rfl:    fluticasone  (FLOVENT  HFA) 110 MCG/ACT inhaler, Inhale 2 puffs into the lungs 2 (two) times daily. Rinse mouth with water  after each use, Disp: 1 each, Rfl: 0   gabapentin  (NEURONTIN ) 100 MG capsule, Take 1 capsule (100 mg total) by mouth at bedtime., Disp: 60 capsule, Rfl: 5   hydrocortisone  (ANUSOL -HC) 2.5 % rectal cream, Place 1 Application rectally 2 (two) times daily., Disp: 30 g, Rfl: 1    Ibuprofen (ADVIL) 200 MG CAPS, Take 1 capsule by mouth daily as needed., Disp: , Rfl:    ipratropium (ATROVENT) 0.06 % nasal spray, Place into both nostrils., Disp: , Rfl:    lidocaine  (LMX) 4 % cream, Apply 1 Application topically as needed., Disp: , Rfl:    losartan (COZAAR) 50 MG tablet, Take 50 mg by mouth daily., Disp: , Rfl:    lubiprostone  (AMITIZA ) 8 MCG capsule, Take 1 capsule (8 mcg total) by mouth 2 (two) times daily with a meal., Disp: 60 capsule, Rfl: 3   melatonin 5 MG TABS, Take 20 mg by mouth at bedtime., Disp: , Rfl:    pantoprazole  (PROTONIX ) 40 MG tablet, TAKE 1 TABLET(40 MG) BY MOUTH DAILY, Disp: 30 tablet, Rfl: 11   promethazine -dextromethorphan (PROMETHAZINE -DM) 6.25-15 MG/5ML syrup, Take 5 mLs by mouth 4 (four) times daily as needed., Disp: 100 mL, Rfl: 0   traMADol  (ULTRAM ) 50 MG tablet, Take 50 mg by mouth 3 (three) times daily as needed., Disp: , Rfl:   Current Facility-Administered Medications:    methylPREDNISolone  acetate (DEPO-MEDROL ) injection 40 mg, 40 mg, Intra-articular, Once, Margrette Taft BRAVO, MD   methylPREDNISolone  acetate (DEPO-MEDROL ) injection 40 mg, 40 mg, Intra-articular, Once, Margrette Taft BRAVO, MD  ROS  Constitutional: Denies any fever or chills Gastrointestinal: No reported hemesis, hematochezia, vomiting, or acute GI distress Musculoskeletal: Denies any acute onset joint swelling, redness, loss of ROM, or weakness Neurological: No reported episodes of acute onset apraxia, aphasia, dysarthria, agnosia,  amnesia, paralysis, loss of coordination, or loss of consciousness  Allergies  Mr. Chavira is allergic to codeine.  PFSH  Drug: Mr. Yohn  reports no history of drug use. Alcohol:  reports that he does not currently use alcohol. Tobacco:  reports that he has been smoking cigarettes. He has a 82.5 pack-year smoking history. He has been exposed to tobacco smoke. He has never used smokeless tobacco. Medical:  has a past medical history of Anxiety  and depression, Arteriosclerotic cardiovascular disease (ASCVD), Blood clotting disorder, Carpal tunnel syndrome, Chronic anticoagulation, Chronic upper extremity pain (11/02/2023), Deep vein thrombosis (HCC), Degenerative joint disease, Gastroesophageal reflux disease, Hyperlipidemia, Hypertension, Myocardial infarction [facility]), Prostate cancer (HCC), Stroke (HCC), and Tobacco abuse. Surgical: Mr. Reaver  has a past surgical history that includes Inguinal hernia repair; Rotator cuff repair; ORIF-unknown fracture; Colonoscopy (?  Date); Prostate biopsy; Colonoscopy with propofol  (N/A, 07/14/2021); polypectomy (07/14/2021); Cardiac catheterization (2004); IM nailing humerus (Left); Back surgery (1988); and Cataract extraction w/PHACO (Right, 04/04/2022). Family: family history includes Breast cancer in his maternal aunt; Coronary artery disease in his father and mother; Heart disease in his mother; Lung cancer in his cousin; Melanoma in his daughter.  Constitutional Exam  General appearance: Well nourished, well developed, and well hydrated. In no apparent acute distress There were no vitals filed for this visit. BMI Assessment: Estimated body mass index is 23.06 kg/m as calculated from the following:   Height as of 11/02/23: 6' (1.829 m).   Weight as of 11/02/23: 170 lb (77.1 kg).  BMI interpretation table: BMI level Category Range association with higher incidence of chronic pain  <18 kg/m2 Underweight   18.5-24.9 kg/m2 Ideal body weight   25-29.9 kg/m2 Overweight Increased incidence by 20%  30-34.9 kg/m2 Obese (Class I) Increased incidence by 68%  35-39.9 kg/m2 Severe obesity (Class II) Increased incidence by 136%  >40 kg/m2 Extreme obesity (Class III) Increased incidence by 254%   Patient's current BMI Ideal Body weight  There is no height or weight on file to calculate BMI. Patient weight not recorded   BMI Readings from Last 4 Encounters:  11/02/23 23.06 kg/m  10/17/23 23.06 kg/m   10/05/23 23.06 kg/m  09/24/23 23.06 kg/m   Wt Readings from Last 4 Encounters:  11/02/23 170 lb (77.1 kg)  10/17/23 170 lb 0.3 oz (77.1 kg)  10/05/23 170 lb (77.1 kg)  09/24/23 170 lb (77.1 kg)    Psych/Mental status: Alert, oriented x 3 (person, place, & time)       Eyes: PERLA Respiratory: No evidence of acute respiratory distress  Assessment & Plan  Primary Diagnosis & Pertinent Problem List: The primary encounter diagnosis was Chronic pain syndrome. Diagnoses of Chronic neck and back pain (1ry area of Pain) (Bilateral), Chronic shoulder pain (2ry area of Pain) (Bilateral), Chronic lower extremity pain (3ry area of Pain) (Bilateral) (L>R), Abnormal MRI, cervical spine (11/12/2023), and Abnormal MRI, lumbar spine (11/12/2023) were also pertinent to this visit. Visit Diagnosis: 1. Chronic pain syndrome   2. Chronic neck and back pain (1ry area of Pain) (Bilateral)   3. Chronic shoulder pain (2ry area of Pain) (Bilateral)   4. Chronic lower extremity pain (3ry area of Pain) (Bilateral) (L>R)   5. Abnormal MRI, cervical spine (11/12/2023)   6. Abnormal MRI, lumbar spine (11/12/2023)    Problems updated and reviewed during this visit: Problem  Abnormal MRI, cervical spine (11/12/2023)   (11/12/2023) CERVICAL SPINE MRI FINDINGS:   BONES AND ALIGNMENT: Mildly exaggerated cervical lordosis. 1.5 mm posterior  subluxation at C3-C4. Normal vertebral body heights. Chronic mild superior endplate compression at T1. Borderline low T1 signal in the T2 vertebral body but without accentuated T2 signal, probably incidental. Background marrow signal is unremarkable unless otherwise stated. No abnormal enhancement.   SPINAL CORD: Normal spinal cord size. Normal spinal cord signal.   SOFT TISSUES: No paraspinal mass.   C2-C3: Moderate left foraminal stenosis due to facet arthropathy and mild uncinate spurring.   C3-C4: Prominent left and moderate right foraminal stenosis due to  uncinate spurring and mild disc bulge.   C4-C5: Prominent right foraminal stenosis and borderline central stenosis due to uncinate facet spurring along with disc bulge.   C5-C6: No impingement. Degenerative left facet arthropathy.   C6-C7: No significant disc herniation. No spinal canal stenosis or neural foraminal narrowing.   C7-T1: No impingement. Bilateral degenerative facet arthropathy.   T1-T2: Moderate right foraminal stenosis due to facet spurring and uncinate spurring. Minimal disc bulge.   General Cervical Spine: Mild disc desiccation throughout the cervical spine.   IMPRESSION: 1. C3-4: prominent left and moderate right foraminal stenosis due to uncinate spurring and mild disc bulge. 2. C4-5: prominent right foraminal stenosis and borderline central stenosis due to uncinate facet spurring along with disc bulge. 3. C2-3: moderate left foraminal stenosis due to facet arthropathy and mild uncinate spurring. 4. T1-2: moderate right foraminal stenosis due to facet spurring and uncinate spurring, with minimal disc bulge.   Abnormal MRI, lumbar spine (11/12/2023)   (11/12/2023) LUMBAR SPINE MRI FINDINGS:   BONES AND ALIGNMENT: Normal alignment. Normal vertebral body heights, except for L5 which is partially sacralized. 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 This represents a change in numbering compared to the prior lumbar spine CT from 09/24/2023. Careful correlation with this numbering strategy is recommended if procedural intervention is to be performed. Bone marrow signal: Fatty marrow at L5 and in the bony sacrum favoring prior regional radiation therapy. Acquired interbody fusion at L1-L2. Disc desiccation in the remainder of the lumbar intervertebral disc spaces with loss of disc height at L4-L5. 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   SPINAL CORD: Normal appearing conus medullaris terminates at the T12 level.   SOFT TISSUES: No paraspinal mass. Fatty atrophy of the left  iliacus and left psoas muscles.   T12-L1: Disc bulge on right foraminal line in the arterial. No impingement.   L1-L2: Acquired interbody fusion. No impingement.   L2-L3: Moderate right subarticular lateral recess stenosis and borderline right foraminal stenosis due to facet arthropathy, disc bulge, and mild intervertebral spurring.   L3-L4: Moderate central stenosis and moderate displacement of the right L3 nerve in the lateral extraforaminal space due to disc bulge, intervertebral spurring, and facet arthropathy.   L4-L5: Moderate left foraminal stenosis and moderate displacement of the left L4 nerve and the lateral extraforaminal space due to intervertebral spurring and facet arthropathy. Prior right laminectomy at L4.   L5-S1: Rudimentary disc material below the somewhat sacralized L5 vertebral body. No impingement.   INCIDENTAL FINDINGS: Bilateral fluid signal intensity renal cysts warrant no further imaging workup. Infrarenal abdominal aortic aneurysm 3.7 cm anterior to posterior dimension less suspected internal mural thrombus.   IMPRESSION: 1. Moderate central stenosis and moderate displacement of the right L3 nerve in the lateral extraforaminal space at L3-4 due to disc bulge, intervertebral spurring, and facet arthropathy. 2. Moderate left foraminal stenosis and moderate displacement of the left L4 nerve in the lateral extraforaminal space at L4-5 due to intervertebral spurring and facet arthropathy. Prior right  laminectomy at L4. 3. Moderate right subarticular lateral recess stenosis and borderline right foraminal stenosis at L2-3 due to facet arthropathy, disc bulge, and mild intervertebral spurring. 4. Please note that the L5 vertebra is sacralized and the T12 ribs are hypoplastic. Careful correlation with the numbering strategy is recommended if procedural intervention is to be performed. 5. Infrarenal abdominal aortic aneurysm measuring 3.7 cm in anterior  to posterior dimension with suspected internal mural thrombus. Recommendation is surveillance with a 2-year nterval.   Aortic Aneurysm  Coronary Artery Disease Involving Native Coronary Artery of Native Heart Without Angina Pectoris   06/2008: DES to the RCA and LAD   Gastroesophageal Reflux Disease    Plan of Care  Assessment and Plan             Pharmacotherapy (Medications Ordered): No orders of the defined types were placed in this encounter.  Procedure Orders    No procedure(s) ordered today   No orders of the defined types were placed in this encounter.  Lab Orders  No laboratory test(s) ordered today   Imaging Orders  No imaging studies ordered today   Referral Orders  No referral(s) requested today    Pharmacological management:  Opioid Analgesics: I will not be prescribing any opioids at this time Membrane stabilizer: I will not be prescribing any at this time Muscle relaxant: I will not be prescribing any at this time NSAID: I will not be prescribing any at this time Other analgesic(s): I will not be prescribing any at this time      Interventional Therapies  Risk Factors  Considerations  Medical Comorbidities:  AA  CAD  Hx. DVT  GERD  HTN  Bradycardia  Hx. Stroke  Hx. DVT  Tobacco Abuse  Constipation  advanced age     Planned  Pending:   Referral to physical therapy to work with the patient's neck pain and upper back pain (11/02/2023)    Under consideration:   Diagnostic/therapeutic midline cervical ESI #1  Diagnostic/therapeutic bilateral trapezius muscle TPI/MNB #1  Diagnostic/therapeutic midline caudal ESI #1 + diagnostic epidurogram #1    Completed: (Analgesic benefit)1  None at this time   Therapeutic  Palliative (PRN) options:   None established   Completed by other providers:   None reported  1(Analgesic benefit): Expressed in percentage (%). (Local anesthetic[LA] +/- sedation  L.A.Local Anesthetic  Steroid benefit   Ongoing benefit)      Provider-requested follow-up: No follow-ups on file. Recent Visits Date Type Provider Dept  11/02/23 Office Visit Tanya Glisson, MD [facility]  Showing recent visits within past 90 days and meeting all other requirements Future Appointments Date Type Provider Dept  12/11/23 Appointment Tanya Glisson, MD [facility]  Showing future appointments within next 90 days and meeting all other requirements   Primary Care Physician: Sheryle Carwin, MD  Duration of encounter: *** minutes.  Total time on encounter, as per AMA guidelines included both the face-to-face and non-face-to-face time personally spent by the physician and/or other qualified health care professional(s) on the day of the encounter (includes time in activities that require the physician or other qualified health care professional and does not include time in activities normally performed by clinical staff). Physician's time may include the following activities when performed: Preparing to see the patient (e.g., pre-charting review of records, searching for previously ordered imaging, lab work, and nerve conduction tests) Review of prior analgesic pharmacotherapies. Reviewing PMP Interpreting ordered tests (e.g., lab work, imaging, nerve conduction tests)  Performing post-procedure evaluations, including interpretation of diagnostic procedures Obtaining and/or reviewing separately obtained history Performing a medically appropriate examination and/or evaluation Counseling and educating the patient/family/caregiver Ordering medications, tests, or procedures Referring and communicating with other health care professionals (when not separately reported) Documenting clinical information in the electronic or other health record Independently interpreting results (not separately reported) and communicating results to the patient/ family/caregiver Care coordination (not separately  reported)  Note by: Eric DELENA Como, MD (TTS technology used. I apologize for any typographical errors that were not detected and corrected.) Date: 12/11/2023; Time: 9:17 AM

## 2023-12-11 ENCOUNTER — Ambulatory Visit: Admitting: Pain Medicine

## 2023-12-11 DIAGNOSIS — M4854XS Collapsed vertebra, not elsewhere classified, thoracic region, sequela of fracture: Secondary | ICD-10-CM | POA: Insufficient documentation

## 2023-12-11 DIAGNOSIS — M25512 Pain in left shoulder: Secondary | ICD-10-CM | POA: Insufficient documentation

## 2023-12-11 DIAGNOSIS — Q7649 Other congenital malformations of spine, not associated with scoliosis: Secondary | ICD-10-CM | POA: Insufficient documentation

## 2023-12-11 DIAGNOSIS — N281 Cyst of kidney, acquired: Secondary | ICD-10-CM | POA: Insufficient documentation

## 2023-12-11 DIAGNOSIS — M47812 Spondylosis without myelopathy or radiculopathy, cervical region: Secondary | ICD-10-CM | POA: Insufficient documentation

## 2023-12-11 DIAGNOSIS — M5459 Other low back pain: Secondary | ICD-10-CM | POA: Insufficient documentation

## 2023-12-11 DIAGNOSIS — M4800 Spinal stenosis, site unspecified: Secondary | ICD-10-CM | POA: Insufficient documentation

## 2023-12-11 DIAGNOSIS — M4802 Spinal stenosis, cervical region: Secondary | ICD-10-CM | POA: Insufficient documentation

## 2023-12-11 DIAGNOSIS — M75102 Unspecified rotator cuff tear or rupture of left shoulder, not specified as traumatic: Secondary | ICD-10-CM | POA: Insufficient documentation

## 2023-12-11 DIAGNOSIS — M6250 Muscle wasting and atrophy, not elsewhere classified, unspecified site: Secondary | ICD-10-CM | POA: Insufficient documentation

## 2023-12-11 DIAGNOSIS — Z9889 Other specified postprocedural states: Secondary | ICD-10-CM | POA: Insufficient documentation

## 2023-12-11 DIAGNOSIS — M67912 Unspecified disorder of synovium and tendon, left shoulder: Secondary | ICD-10-CM | POA: Insufficient documentation

## 2023-12-11 DIAGNOSIS — M150 Primary generalized (osteo)arthritis: Secondary | ICD-10-CM | POA: Insufficient documentation

## 2023-12-11 DIAGNOSIS — Z91199 Patient's noncompliance with other medical treatment and regimen due to unspecified reason: Secondary | ICD-10-CM

## 2023-12-11 DIAGNOSIS — M7552 Bursitis of left shoulder: Secondary | ICD-10-CM | POA: Insufficient documentation

## 2023-12-11 DIAGNOSIS — S22000S Wedge compression fracture of unspecified thoracic vertebra, sequela: Secondary | ICD-10-CM | POA: Insufficient documentation

## 2023-12-11 DIAGNOSIS — S22080S Wedge compression fracture of T11-T12 vertebra, sequela: Secondary | ICD-10-CM | POA: Insufficient documentation

## 2023-12-11 DIAGNOSIS — G8929 Other chronic pain: Secondary | ICD-10-CM

## 2023-12-11 DIAGNOSIS — M51369 Other intervertebral disc degeneration, lumbar region without mention of lumbar back pain or lower extremity pain: Secondary | ICD-10-CM | POA: Insufficient documentation

## 2023-12-11 DIAGNOSIS — M48061 Spinal stenosis, lumbar region without neurogenic claudication: Secondary | ICD-10-CM | POA: Insufficient documentation

## 2023-12-11 DIAGNOSIS — M170 Bilateral primary osteoarthritis of knee: Secondary | ICD-10-CM | POA: Insufficient documentation

## 2023-12-11 DIAGNOSIS — M5030 Other cervical disc degeneration, unspecified cervical region: Secondary | ICD-10-CM | POA: Insufficient documentation

## 2023-12-11 DIAGNOSIS — I7143 Infrarenal abdominal aortic aneurysm, without rupture: Secondary | ICD-10-CM | POA: Insufficient documentation

## 2023-12-11 DIAGNOSIS — M8580 Other specified disorders of bone density and structure, unspecified site: Secondary | ICD-10-CM | POA: Insufficient documentation

## 2023-12-11 DIAGNOSIS — M19011 Primary osteoarthritis, right shoulder: Secondary | ICD-10-CM | POA: Insufficient documentation

## 2023-12-11 DIAGNOSIS — M85812 Other specified disorders of bone density and structure, left shoulder: Secondary | ICD-10-CM | POA: Insufficient documentation

## 2023-12-11 DIAGNOSIS — M5134 Other intervertebral disc degeneration, thoracic region: Secondary | ICD-10-CM | POA: Insufficient documentation

## 2023-12-11 DIAGNOSIS — R937 Abnormal findings on diagnostic imaging of other parts of musculoskeletal system: Secondary | ICD-10-CM

## 2023-12-11 DIAGNOSIS — D7589 Other specified diseases of blood and blood-forming organs: Secondary | ICD-10-CM | POA: Insufficient documentation

## 2023-12-11 DIAGNOSIS — S46112S Strain of muscle, fascia and tendon of long head of biceps, left arm, sequela: Secondary | ICD-10-CM | POA: Insufficient documentation

## 2023-12-11 DIAGNOSIS — G894 Chronic pain syndrome: Secondary | ICD-10-CM

## 2023-12-11 DIAGNOSIS — M19012 Primary osteoarthritis, left shoulder: Secondary | ICD-10-CM | POA: Insufficient documentation

## 2023-12-11 DIAGNOSIS — M1712 Unilateral primary osteoarthritis, left knee: Secondary | ICD-10-CM | POA: Insufficient documentation

## 2023-12-11 DIAGNOSIS — S22070S Wedge compression fracture of T9-T10 vertebra, sequela: Secondary | ICD-10-CM | POA: Insufficient documentation

## 2023-12-11 DIAGNOSIS — S13140S Subluxation of C3/C4 cervical vertebrae, sequela: Secondary | ICD-10-CM | POA: Insufficient documentation

## 2023-12-11 DIAGNOSIS — M47816 Spondylosis without myelopathy or radiculopathy, lumbar region: Secondary | ICD-10-CM | POA: Insufficient documentation

## 2023-12-11 NOTE — Patient Instructions (Signed)
 ______________________________________________________________________    Procedure instructions  Stop blood-thinners  Do not eat or drink fluids (other than water ) for 6 hours before your procedure  No water  for 2 hours before your procedure  Take your blood pressure medicine with a sip of water   Arrive 30 minutes before your appointment  If sedation is planned, bring suitable driver. [place], [place], & public transportation are NOT APPROVED)  Carefully read the Preparing for your procedure detailed instructions  If you have questions call us  at [phone]  Procedure appointments are for procedures only.   NO medication refills or new problem evaluations will be done on procedure days.   Only the scheduled, pre-approved procedure and side will be done.   ______________________________________________________________________     ______________________________________________________________________    Preparing for your procedure  Appointments: If you think you may not be able to keep your appointment, call 24-48 hours in advance to cancel. We need time to make it available to others.  Procedure visits are for procedures only. During your procedure appointment there will be: NO Prescription Refills*. NO medication changes or discussions*. NO discussion of disability issues*. NO unrelated pain problem evaluations*. NO evaluations to order other pain procedures*. *These will be addressed at a separate and distinct evaluation encounter on the provider's evaluation schedule and not during procedure days.  Instructions: Food intake: Avoid eating anything solid for at least 8 hours prior to your procedure. Clear liquid intake: You may take clear liquids such as water  up to 2 hours prior to your procedure. (No carbonated drinks. No soda.) Transportation: Unless otherwise stated by your physician, bring a driver. (Driver cannot be a Market researcher, Pharmacist, community, or any other form of public  transportation.) Morning Medicines: Except for blood thinners, take all of your other morning medications with a sip of water . Make sure to take your heart and blood pressure medicines. If your blood pressure's lower number is above 100, the case will be rescheduled. Blood thinners: Make sure to stop your blood thinners as instructed.  If you take a blood thinner, but were not instructed to stop it, call our office [phone] and ask to talk to a nurse. Not stopping a blood thinner prior to certain procedures could lead to serious complications. Diabetics on insulin : Notify the staff so that you can be scheduled 1st case in the morning. If your diabetes requires high dose insulin , take only  of your normal insulin  dose the morning of the procedure and notify the staff that you have done so. Preventing infections: Shower with an antibacterial soap the morning of your procedure.  Build-up your immune system: Take 1000 mg of Vitamin C with every meal (3 times a day) the day prior to your procedure. Antibiotics: Inform the nursing staff if you are taking any antibiotics or if you have any conditions that may require antibiotics prior to procedures. (Example: recent joint implants)   Pregnancy: If you are pregnant make sure to notify the nursing staff. Not doing so may result in injury to the fetus, including death.  Sickness: If you have a cold, fever, or any active infections, call and cancel or reschedule your procedure. Receiving steroids while having an infection may result in complications. Arrival: You must be in the facility at least 30 minutes prior to your scheduled procedure. Tardiness: Your scheduled time is also the cutoff time. If you do not arrive at least 15 minutes prior to your procedure, you will be rescheduled.  Children: Do not bring any children with  you. Make arrangements to keep them home. Dress appropriately: There is always a possibility that your clothing may get soiled. Avoid  long dresses. Valuables: Do not bring any jewelry or valuables.  Reasons to call and reschedule or cancel your procedure: (Following these recommendations will minimize the risk of a serious complication.) Surgeries: Avoid having procedures within 2 weeks of any surgery. (Avoid for 2 weeks before or after any surgery). Flu Shots: Avoid having procedures within 2 weeks of a flu shots or . (Avoid for 2 weeks before or after immunizations). Barium: Avoid having a procedure within 7-10 days after having had a radiological study involving the use of radiological contrast. (Myelograms, Barium swallow or enema study). Heart attacks: Avoid any elective procedures or surgeries for the initial 6 months after a Myocardial Infarction (Heart Attack). Blood thinners: It is imperative that you stop these medications before procedures. Let us  know if you if you take any blood thinner.  Infection: Avoid procedures during or within two weeks of an infection (including chest colds or gastrointestinal problems). Symptoms associated with infections include: Localized redness, fever, chills, night sweats or profuse sweating, burning sensation when voiding, cough, congestion, stuffiness, runny nose, sore throat, diarrhea, nausea, vomiting, cold or Flu symptoms, recent or current infections. It is specially important if the infection is over the area that we intend to treat. Heart and lung problems: Symptoms that may suggest an active cardiopulmonary problem include: cough, chest pain, breathing difficulties or shortness of breath, dizziness, ankle swelling, uncontrolled high or unusually low blood pressure, and/or palpitations. If you are experiencing any of these symptoms, cancel your procedure and contact your primary care physician for an evaluation.  Remember:  Regular Business hours are:  Monday to Thursday 8:00 AM to 4:00 PM  Provider's Schedule: Eric Como, MD:  Procedure days: Tuesday and Thursday 7:30  AM to 4:00 PM  Wallie Sherry, MD:  Procedure days: Monday and Wednesday 7:30 AM to 4:00 PM Last  Updated: 12/20/2022 ______________________________________________________________________     ______________________________________________________________________    General Risks and Possible Complications  Patient Responsibilities: It is important that you read this as it is part of your informed consent. It is our duty to inform you of the risks and possible complications associated with treatments offered to you. It is your responsibility as a patient to read this and to ask questions about anything that is not clear or that you believe was not covered in this document.  Patient's Rights: You have the right to refuse treatment. You also have the right to change your mind, even after initially having agreed to have the treatment done. However, under this last option, if you wait until the last second to change your mind, you may be charged for the materials used up to that point.  Introduction: Medicine is not an Visual merchandiser. Everything in Medicine, including the lack of treatment(s), carries the potential for danger, harm, or loss (which is by definition: Risk). In Medicine, a complication is a secondary problem, condition, or disease that can aggravate an already existing one. All treatments carry the risk of possible complications. The fact that a side effects or complications occurs, does not imply that the treatment was conducted incorrectly. It must be clearly understood that these can happen even when everything is done following the highest safety standards.  No treatment: You can choose not to proceed with the proposed treatment alternative. The "PRO(s)" would include: avoiding the risk of complications associated with the therapy. The "CON(s)" would include:  not getting any of the treatment benefits. These benefits fall under one of three categories: diagnostic; therapeutic; and/or  palliative. Diagnostic benefits include: getting information which can ultimately lead to improvement of the disease or symptom(s). Therapeutic benefits are those associated with the successful treatment of the disease. Finally, palliative benefits are those related to the decrease of the primary symptoms, without necessarily curing the condition (example: decreasing the pain from a flare-up of a chronic condition, such as incurable terminal cancer).  General Risks and Complications: These are associated to most interventional treatments. They can occur alone, or in combination. They fall under one of the following six (6) categories: no benefit or worsening of symptoms; bleeding; infection; nerve damage; allergic reactions; and/or death. No benefits or worsening of symptoms: In Medicine there are no guarantees, only probabilities. No healthcare provider can ever guarantee that a medical treatment will work, they can only state the probability that it may. Furthermore, there is always the possibility that the condition may worsen, either directly, or indirectly, as a consequence of the treatment. Bleeding: This is more common if the patient is taking a blood thinner, either prescription or over the counter (example: Goody Powders, Fish oil, Aspirin, Garlic, etc.), or if suffering a condition associated with impaired coagulation (example: Hemophilia, cirrhosis of the liver, low platelet counts, etc.). However, even if you do not have one on these, it can still happen. If you have any of these conditions, or take one of these drugs, make sure to notify your treating physician. Infection: This is more common in patients with a compromised immune system, either due to disease (example: diabetes, cancer, human immunodeficiency virus [HIV], etc.), or due to medications or treatments (example: therapies used to treat cancer and rheumatological diseases). However, even if you do not have one on these, it can still  happen. If you have any of these conditions, or take one of these drugs, make sure to notify your treating physician. Nerve Damage: This is more common when the treatment is an invasive one, but it can also happen with the use of medications, such as those used in the treatment of cancer. The damage can occur to small secondary nerves, or to large primary ones, such as those in the spinal cord and brain. This damage may be temporary or permanent and it may lead to impairments that can range from temporary numbness to permanent paralysis and/or brain death. Allergic Reactions: Any time a substance or material comes in contact with our body, there is the possibility of an allergic reaction. These can range from a mild skin rash (contact dermatitis) to a severe systemic reaction (anaphylactic reaction), which can result in death. Death: In general, any medical intervention can result in death, most of the time due to an unforeseen complication. ______________________________________________________________________      ______________________________________________________________________    Steroid injections  Common steroids for injections Triamcinolone: Used by many sports medicine physicians for large joint and bursal injections, often combined with a local anesthetic like lidocaine . A study focusing on coccydynia (tailbone pain) found triamcinolone was more effective than betamethasone , suggesting it may also be preferable for other localized inflammation conditions. Methylprednisolone: A common alternative to triamcinolone that is also a strong anti-inflammatory. It is available in different formulations, with the acetate suspension being the long-acting option for intra-articular injections. Dexamethasone : This is a non-particulate steroid, meaning it has a lower risk of tissue damage compared to particulate steroids like triamcinolone and methylprednisolone. While less common for this specific  use,  it is an option for targeted injections.   Considerations for physicians Particulate vs. non-particulate steroids: Triamcinolone and methylprednisolone are particulate, meaning they can clump together. Dexamethasone  is non-particulate. Particulate steroids are often preferred for their longer-lasting effects but carry a theoretical higher risk for certain injections (though this is less of a concern in the costochondral joints). Combined injectate: Corticosteroids are typically mixed with a local anesthetic like lidocaine  to provide both immediate pain relief (from the anesthetic) and longer-term inflammation reduction (from the steroid). Imaging guidance: To ensure accurate placement of the needle and medication, physicians may use ultrasound or fluoroscopic guidance for the injection, especially in complex or refractory cases.   Patient guidance Before undergoing a steroid injection, discuss the options with your physician. They will determine the best steroid, dosage, and procedure for your specific case based on factors like: Severity of your condition History of response to other treatments Your overall health status Experience and preference of the physician  Last  Updated: 09/05/2023 ______________________________________________________________________

## 2023-12-12 ENCOUNTER — Ambulatory Visit: Admitting: Gastroenterology

## 2023-12-12 VITALS — BP 188/82 | HR 66 | Temp 98.4°F | Ht 72.0 in | Wt 176.8 lb

## 2023-12-12 DIAGNOSIS — K529 Noninfective gastroenteritis and colitis, unspecified: Secondary | ICD-10-CM | POA: Insufficient documentation

## 2023-12-12 DIAGNOSIS — R194 Change in bowel habit: Secondary | ICD-10-CM

## 2023-12-12 DIAGNOSIS — R109 Unspecified abdominal pain: Secondary | ICD-10-CM | POA: Insufficient documentation

## 2023-12-12 NOTE — Progress Notes (Signed)
 Gastroenterology Office Note     Primary Care Physician:  Sheryle Carwin, MD  Primary Gastroenterologist: Dr Cindie   Chief Complaint   Chief Complaint  Patient presents with   Follow-up    Left side pain X awhile. Pt is  having diarrhea instead of constipation. Pt wants hemorrhoids cut out     History of Present Illness   Alex Lara. is an 83 y.o. male presenting today with a history of  constipation, symptomatic hemorrhoids, banding X 3 in 2023, possible fissure in the past, last seen in May 2025. Now having diarrhea instead of constipation, symptomatic hemorrhoids, seen in ED July 2025 with proctocolitis. Colonoscopy in 2023 with  non-bleeding internal hemorrhoids, sigmoid diverticulosis, one 5 mm polyp s/p removal. Sessile serrated adenoma. 5 year surveillance     CT abd/pelvis with contrast in July 2025: moderate wall thickening involving the rectum and distal sigmoid, consistent with proctocolitis. Stable 3.7 cm infrarenal abdominal aortic aneurysm.   CTA abd/pelvis with and without contrast Sept 2025: SMA stable advanced change with areas of significant stenosis but no occlusion,  branch vessels patent. IMA diminutive, celiac stable advanced atherosclerotic change without significant stenosis.      BM about 5 times a day. Left sided pain unknown how long time.   Wife would like for patient to be checked for H.pylori. son has it.   Burning with BMs. Wants hemorrhoids removed. Wants to see a careers adviser.   Had time of fecal incontinence.   BP 181/93, then 199/88. No headache or blurred vision. Talking to PCP today.   Taking pantoprazole  daily. Strangling with liquids, solid food dysphagia also.   Will drink one shot bottle occasionally. Mildly elevated AST recently.   BPE Dec 2024: esophageal ring that could cause issues with tougher textures. No issues with the tablet. Recommend chewing well, soft textures, avoiding laying down for at least 2-3 hours after eating.  Take small bites. Wife had desired to avoid invasive procedure. Just let us  know if that changes.    Past Medical History:  Diagnosis Date   Anxiety and depression    Arteriosclerotic cardiovascular disease (ASCVD)    06/2008: DES to the RCA and LAD   Blood clotting disorder    Carpal tunnel syndrome    Chronic anticoagulation    Chronic upper extremity pain 11/02/2023   Deep vein thrombosis (HCC)    Degenerative joint disease    Gastroesophageal reflux disease    Hyperlipidemia    Hypertension    Myocardial infarction (HCC)    1970   Prostate cancer (HCC)    Stroke (HCC)    Tobacco abuse     Past Surgical History:  Procedure Laterality Date   BACK SURGERY  1988   CARDIAC CATHETERIZATION  2004   stents   CATARACT EXTRACTION W/PHACO Right 04/04/2022   Procedure: CATARACT EXTRACTION PHACO AND INTRAOCULAR LENS PLACEMENT (IOC);  Surgeon: Harrie Agent, MD;  Location: [facility];  Service: Ophthalmology;  Laterality: Right;  CDE: 10.63   COLONOSCOPY  ?  Date   COLONOSCOPY WITH PROPOFOL  N/A 07/14/2021   Procedure: COLONOSCOPY WITH PROPOFOL ;  Surgeon: Cindie Carlin POUR, DO;  Location: [facility];  Service: Endoscopy;  Laterality: N/A;  8:15am, asa 3   IM NAILING HUMERUS Left    INGUINAL HERNIA REPAIR     wife- states umbilical hernia repair, not inguinal.   ORIF-unknown fracture     POLYPECTOMY  07/14/2021   Procedure: POLYPECTOMY;  Surgeon: Cindie Carlin POUR,  DO;  Location: [facility];  Service: Endoscopy;;   PROSTATE BIOPSY     ROTATOR CUFF REPAIR Bilateral     Current Outpatient Medications  Medication Sig Dispense Refill   acetaminophen  (TYLENOL ) 500 MG tablet Take 500 mg by mouth every 6 (six) hours as needed.     albuterol  (VENTOLIN  HFA) 108 (90 Base) MCG/ACT inhaler Inhale 2 puffs into the lungs every 4 (four) hours as needed for wheezing or shortness of breath.     ARNUITY ELLIPTA 100 MCG/ACT AEPB Inhale 1 puff into the lungs daily.     aspirin  EC 325 MG tablet  Take 325 mg by mouth daily.     atorvastatin  (LIPITOR) 40 MG tablet Take 1 tablet (40 mg total) by mouth daily at 6 PM. 30 tablet 3   Calcium  Carbonate-Vit D-Min (CALCIUM  1200 PO) Take by mouth daily.     clonazePAM  (KLONOPIN ) 0.5 MG tablet Take 0.5 mg by mouth 3 (three) times daily as needed for anxiety.     fluticasone  (FLOVENT  HFA) 110 MCG/ACT inhaler Inhale 2 puffs into the lungs 2 (two) times daily. Rinse mouth with water  after each use 1 each 0   gabapentin  (NEURONTIN ) 100 MG capsule Take 1 capsule (100 mg total) by mouth at bedtime. 60 capsule 5   hydrocortisone  (ANUSOL -HC) 2.5 % rectal cream Place 1 Application rectally 2 (two) times daily. 30 g 1   Ibuprofen (ADVIL) 200 MG CAPS Take 1 capsule by mouth daily as needed.     ipratropium (ATROVENT) 0.06 % nasal spray Place into both nostrils.     lidocaine  (LMX) 4 % cream Apply 1 Application topically as needed.     losartan (COZAAR) 50 MG tablet Take 50 mg by mouth daily.     melatonin 5 MG TABS Take 20 mg by mouth at bedtime.     pantoprazole  (PROTONIX ) 40 MG tablet TAKE 1 TABLET(40 MG) BY MOUTH DAILY 30 tablet 11   promethazine -dextromethorphan (PROMETHAZINE -DM) 6.25-15 MG/5ML syrup Take 5 mLs by mouth 4 (four) times daily as needed. 100 mL 0   traMADol  (ULTRAM ) 50 MG tablet Take 50 mg by mouth 3 (three) times daily as needed.     Current Facility-Administered Medications  Medication Dose Route Frequency Provider Last Rate Last Admin   methylPREDNISolone  acetate (DEPO-MEDROL ) injection 40 mg  40 mg Intra-articular Once Margrette Taft BRAVO, MD       methylPREDNISolone  acetate (DEPO-MEDROL ) injection 40 mg  40 mg Intra-articular Once Margrette Taft BRAVO, MD        Allergies as of 12/12/2023 - Review Complete 12/12/2023  Allergen Reaction Noted   Codeine Nausea And Vomiting and Other (See Comments) 11/05/2013    Family History  Problem Relation Age of Onset   Coronary artery disease Father    Coronary artery disease Mother         died post-CABG   Heart disease Mother    Breast cancer Maternal Aunt    Lung cancer Cousin    Melanoma Daughter    Prostate cancer Neg Hx     Social History   Socioeconomic History   Marital status: Married    Spouse name: Not on file   Number of children: 3   Years of education: 12   Highest education level: Not on file  Occupational History    Comment: retired  Tobacco Use   Smoking status: Every Day    Current packs/day: 1.50    Average packs/day: 1.5 packs/day for 55.0 years (82.5 ttl pk-yrs)  Types: Cigarettes    Passive exposure: Current   Smokeless tobacco: Never  Vaping Use   Vaping status: Never Used  Substance and Sexual Activity   Alcohol use: Not Currently   Drug use: No   Sexual activity: Not Currently  Other Topics Concern   Not on file  Social History Narrative   Not on file   Social Drivers of Health   Tobacco Use: High Risk (12/22/2023)   Patient History    Smoking Tobacco Use: Every Day    Smokeless Tobacco Use: Never    Passive Exposure: Current  Financial Resource Strain: Not on file  Food Insecurity: Not on file  Transportation Needs: Not on file  Physical Activity: Not on file  Stress: Not on file  Social Connections: Not on file  Intimate Partner Violence: Not on file  Depression (PHQ2-9): Low Risk (11/02/2023)   Depression (PHQ2-9)    PHQ-2 Score: 0  Alcohol Screen: Not on file  Housing: Not on file  Utilities: Not on file  Health Literacy: Not on file     Review of Systems   Gen: Denies any fever, chills, fatigue, weight loss, lack of appetite.  CV: Denies chest pain, heart palpitations, peripheral edema, syncope.  Resp: Denies shortness of breath at rest or with exertion. Denies wheezing or cough.  GI: Denies dysphagia or odynophagia. Denies jaundice, hematemesis, fecal incontinence. GU : Denies urinary burning, urinary frequency, urinary hesitancy MS: Denies joint pain, muscle weakness, cramps, or limitation of movement.   Derm: Denies rash, itching, dry skin Psych: Denies depression, anxiety, memory loss, and confusion Heme: Denies bruising, bleeding, and enlarged lymph nodes.   Physical Exam   BP (!) 188/82 (BP Location: Right Arm, Patient Position: Sitting, Cuff Size: Normal) Comment: manual  Pulse 66   Temp 98.4 F (36.9 C)   Ht 6' (1.829 m)   Wt 176 lb 12.8 oz (80.2 kg)   BMI 23.98 kg/m  General:   Alert and oriented. Pleasant and cooperative. Well-nourished and well-developed.  Head:  Normocephalic and atraumatic. Abdomen:  +BS, soft, non-tender and non-distended. No HSM noted. No guarding or rebound. No masses appreciated.  Rectal:  Deferred  Msk:  Symmetrical without gross deformities. Normal posture. Extremities:  Without edema. Neurologic:  Alert and  oriented x4 Skin:  Intact without significant lesions or rashes. Psych:  Alert and cooperative. Normal mood and affect.  Lab Results  Component Value Date   WBC 5.6 10/17/2023   HGB 11.9 (L) 10/17/2023   HCT 36.0 (L) 10/17/2023   MCV 94.0 10/17/2023   PLT 146 (L) 10/17/2023   Lab Results  Component Value Date   IRON 54 08/18/2021   TIBC 224 (L) 08/18/2021   FERRITIN 343 (H) 08/18/2021   Lab Results  Component Value Date   VITAMINB12 503 11/21/2023   Lab Results  Component Value Date   FOLATE 6.9 08/18/2021      Assessment   Alex Pehl. is an 83 y.o. male presenting today with a history of constipation, symptomatic hemorrhoids, banding X 3 in 2023, possible fissure in the past, last seen in May 2025, now with concerns for diarrhea,  symptomatic hemorrhoids, dysphagia.   Diarrhea: proctocolitis on CT July 2025. Stool studies completed after visit negative (Cdiff and GI path panel).Colonoscopy with random colonic biopsies recommended in light of CT findings. Last colonoscopy 2023 with sessile serrated adenoma.   hemorrhoids: history of hemorrhoids in past s/p banding in 2023, possible fissure as well in past.  Recommending colonoscopy as planned and suspect we have maximized results of prior hemorrhoid banding. Wait till colonoscopy to decide on next steps, such as general surgery referral. Need to rule out any IBD underlying.  Dysphagia: chronic. Previously wife had declined EGD. 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  Family history of H.pylori: son has been diagnosed with this. Wife requesting to be checked. At this point, would rather he continue PPI therapy and can do biopsies at time of EGD as would have to hold PPI 2 weeks prior.   Mildly elevated AST: recheck now. Endorses alcohol use intermittently.   Chronic thrombocytopenia: normal liver and spleen historically. Query if r/t alcohol use.      PLAN    Stool studies completed: negative Cdiff and GI profile Requested CMP, alpha gal, celiac: still needs to be done Proceed with colonoscopy. Upper endoscopy/dilation  by Dr. Cindie  in near future: the risks, benefits, and alternatives have been discussed with the patient in detail. The patient states understanding and desires to proceed.  Follow-up with PCP regarding BP: going today Return 3 months regardless   Alex MICAEL Stager, PhD, [facility] Gastroenter[facility]

## 2023-12-12 NOTE — H&P (View-Only) (Signed)
 Gastroenterology Office Note     Primary Care Physician:  Sheryle Carwin, MD  Primary Gastroenterologist: Dr Cindie   Chief Complaint   Chief Complaint  Patient presents with   Follow-up    Left side pain X awhile. Pt is  having diarrhea instead of constipation. Pt wants hemorrhoids cut out     History of Present Illness   Alex Lara. is an 83 y.o. male presenting today with a history of  constipation, symptomatic hemorrhoids, banding X 3 in 2023, possible fissure in the past, last seen in May 2025. Now having diarrhea instead of constipation, symptomatic hemorrhoids, seen in ED July 2025 with proctocolitis. Colonoscopy in 2023 with  non-bleeding internal hemorrhoids, sigmoid diverticulosis, one 5 mm polyp s/p removal. Sessile serrated adenoma. 5 year surveillance     CT abd/pelvis with contrast in July 2025: moderate wall thickening involving the rectum and distal sigmoid, consistent with proctocolitis. Stable 3.7 cm infrarenal abdominal aortic aneurysm.   CTA abd/pelvis with and without contrast Sept 2025: SMA stable advanced change with areas of significant stenosis but no occlusion,  branch vessels patent. IMA diminutive, celiac stable advanced atherosclerotic change without significant stenosis.      BM about 5 times a day. Left sided pain unknown how long time.   Wife would like for patient to be checked for H.pylori. son has it.   Burning with BMs. Wants hemorrhoids removed. Wants to see a careers adviser.   Had time of fecal incontinence.   BP 181/93, then 199/88. No headache or blurred vision. Talking to PCP today.   Taking pantoprazole  daily. Strangling with liquids, solid food dysphagia also.   Will drink one shot bottle occasionally. Mildly elevated AST recently.   BPE Dec 2024: esophageal ring that could cause issues with tougher textures. No issues with the tablet. Recommend chewing well, soft textures, avoiding laying down for at least 2-3 hours after eating.  Take small bites. Wife had desired to avoid invasive procedure. Just let us  know if that changes.    Past Medical History:  Diagnosis Date   Anxiety and depression    Arteriosclerotic cardiovascular disease (ASCVD)    06/2008: DES to the RCA and LAD   Blood clotting disorder    Carpal tunnel syndrome    Chronic anticoagulation    Chronic upper extremity pain 11/02/2023   Deep vein thrombosis (HCC)    Degenerative joint disease    Gastroesophageal reflux disease    Hyperlipidemia    Hypertension    Myocardial infarction (HCC)    1970   Prostate cancer (HCC)    Stroke (HCC)    Tobacco abuse     Past Surgical History:  Procedure Laterality Date   BACK SURGERY  1988   CARDIAC CATHETERIZATION  2004   stents   CATARACT EXTRACTION W/PHACO Right 04/04/2022   Procedure: CATARACT EXTRACTION PHACO AND INTRAOCULAR LENS PLACEMENT (IOC);  Surgeon: Harrie Agent, MD;  Location: [facility];  Service: Ophthalmology;  Laterality: Right;  CDE: 10.63   COLONOSCOPY  ?  Date   COLONOSCOPY WITH PROPOFOL  N/A 07/14/2021   Procedure: COLONOSCOPY WITH PROPOFOL ;  Surgeon: Cindie Carlin POUR, DO;  Location: [facility];  Service: Endoscopy;  Laterality: N/A;  8:15am, asa 3   IM NAILING HUMERUS Left    INGUINAL HERNIA REPAIR     wife- states umbilical hernia repair, not inguinal.   ORIF-unknown fracture     POLYPECTOMY  07/14/2021   Procedure: POLYPECTOMY;  Surgeon: Cindie Carlin POUR,  DO;  Location: [facility];  Service: Endoscopy;;   PROSTATE BIOPSY     ROTATOR CUFF REPAIR Bilateral     Current Outpatient Medications  Medication Sig Dispense Refill   acetaminophen  (TYLENOL ) 500 MG tablet Take 500 mg by mouth every 6 (six) hours as needed.     albuterol  (VENTOLIN  HFA) 108 (90 Base) MCG/ACT inhaler Inhale 2 puffs into the lungs every 4 (four) hours as needed for wheezing or shortness of breath.     ARNUITY ELLIPTA 100 MCG/ACT AEPB Inhale 1 puff into the lungs daily.     aspirin  EC 325 MG tablet  Take 325 mg by mouth daily.     atorvastatin  (LIPITOR) 40 MG tablet Take 1 tablet (40 mg total) by mouth daily at 6 PM. 30 tablet 3   Calcium  Carbonate-Vit D-Min (CALCIUM  1200 PO) Take by mouth daily.     clonazePAM  (KLONOPIN ) 0.5 MG tablet Take 0.5 mg by mouth 3 (three) times daily as needed for anxiety.     fluticasone  (FLOVENT  HFA) 110 MCG/ACT inhaler Inhale 2 puffs into the lungs 2 (two) times daily. Rinse mouth with water  after each use 1 each 0   gabapentin  (NEURONTIN ) 100 MG capsule Take 1 capsule (100 mg total) by mouth at bedtime. 60 capsule 5   hydrocortisone  (ANUSOL -HC) 2.5 % rectal cream Place 1 Application rectally 2 (two) times daily. 30 g 1   Ibuprofen (ADVIL) 200 MG CAPS Take 1 capsule by mouth daily as needed.     ipratropium (ATROVENT) 0.06 % nasal spray Place into both nostrils.     lidocaine  (LMX) 4 % cream Apply 1 Application topically as needed.     losartan (COZAAR) 50 MG tablet Take 50 mg by mouth daily.     melatonin 5 MG TABS Take 20 mg by mouth at bedtime.     pantoprazole  (PROTONIX ) 40 MG tablet TAKE 1 TABLET(40 MG) BY MOUTH DAILY 30 tablet 11   promethazine -dextromethorphan (PROMETHAZINE -DM) 6.25-15 MG/5ML syrup Take 5 mLs by mouth 4 (four) times daily as needed. 100 mL 0   traMADol  (ULTRAM ) 50 MG tablet Take 50 mg by mouth 3 (three) times daily as needed.     Current Facility-Administered Medications  Medication Dose Route Frequency Provider Last Rate Last Admin   methylPREDNISolone  acetate (DEPO-MEDROL ) injection 40 mg  40 mg Intra-articular Once Margrette Taft BRAVO, MD       methylPREDNISolone  acetate (DEPO-MEDROL ) injection 40 mg  40 mg Intra-articular Once Margrette Taft BRAVO, MD        Allergies as of 12/12/2023 - Review Complete 12/12/2023  Allergen Reaction Noted   Codeine Nausea And Vomiting and Other (See Comments) 11/05/2013    Family History  Problem Relation Age of Onset   Coronary artery disease Father    Coronary artery disease Mother         died post-CABG   Heart disease Mother    Breast cancer Maternal Aunt    Lung cancer Cousin    Melanoma Daughter    Prostate cancer Neg Hx     Social History   Socioeconomic History   Marital status: Married    Spouse name: Not on file   Number of children: 3   Years of education: 12   Highest education level: Not on file  Occupational History    Comment: retired  Tobacco Use   Smoking status: Every Day    Current packs/day: 1.50    Average packs/day: 1.5 packs/day for 55.0 years (82.5 ttl pk-yrs)  Types: Cigarettes    Passive exposure: Current   Smokeless tobacco: Never  Vaping Use   Vaping status: Never Used  Substance and Sexual Activity   Alcohol use: Not Currently   Drug use: No   Sexual activity: Not Currently  Other Topics Concern   Not on file  Social History Narrative   Not on file   Social Drivers of Health   Tobacco Use: High Risk (12/22/2023)   Patient History    Smoking Tobacco Use: Every Day    Smokeless Tobacco Use: Never    Passive Exposure: Current  Financial Resource Strain: Not on file  Food Insecurity: Not on file  Transportation Needs: Not on file  Physical Activity: Not on file  Stress: Not on file  Social Connections: Not on file  Intimate Partner Violence: Not on file  Depression (PHQ2-9): Low Risk (11/02/2023)   Depression (PHQ2-9)    PHQ-2 Score: 0  Alcohol Screen: Not on file  Housing: Not on file  Utilities: Not on file  Health Literacy: Not on file     Review of Systems   Gen: Denies any fever, chills, fatigue, weight loss, lack of appetite.  CV: Denies chest pain, heart palpitations, peripheral edema, syncope.  Resp: Denies shortness of breath at rest or with exertion. Denies wheezing or cough.  GI: Denies dysphagia or odynophagia. Denies jaundice, hematemesis, fecal incontinence. GU : Denies urinary burning, urinary frequency, urinary hesitancy MS: Denies joint pain, muscle weakness, cramps, or limitation of movement.   Derm: Denies rash, itching, dry skin Psych: Denies depression, anxiety, memory loss, and confusion Heme: Denies bruising, bleeding, and enlarged lymph nodes.   Physical Exam   BP (!) 188/82 (BP Location: Right Arm, Patient Position: Sitting, Cuff Size: Normal) Comment: manual  Pulse 66   Temp 98.4 F (36.9 C)   Ht 6' (1.829 m)   Wt 176 lb 12.8 oz (80.2 kg)   BMI 23.98 kg/m  General:   Alert and oriented. Pleasant and cooperative. Well-nourished and well-developed.  Head:  Normocephalic and atraumatic. Abdomen:  +BS, soft, non-tender and non-distended. No HSM noted. No guarding or rebound. No masses appreciated.  Rectal:  Deferred  Msk:  Symmetrical without gross deformities. Normal posture. Extremities:  Without edema. Neurologic:  Alert and  oriented x4 Skin:  Intact without significant lesions or rashes. Psych:  Alert and cooperative. Normal mood and affect.  Lab Results  Component Value Date   WBC 5.6 10/17/2023   HGB 11.9 (L) 10/17/2023   HCT 36.0 (L) 10/17/2023   MCV 94.0 10/17/2023   PLT 146 (L) 10/17/2023   Lab Results  Component Value Date   IRON 54 08/18/2021   TIBC 224 (L) 08/18/2021   FERRITIN 343 (H) 08/18/2021   Lab Results  Component Value Date   VITAMINB12 503 11/21/2023   Lab Results  Component Value Date   FOLATE 6.9 08/18/2021      Assessment   Alex Pehl. is an 83 y.o. male presenting today with a history of constipation, symptomatic hemorrhoids, banding X 3 in 2023, possible fissure in the past, last seen in May 2025, now with concerns for diarrhea,  symptomatic hemorrhoids, dysphagia.   Diarrhea: proctocolitis on CT July 2025. Stool studies completed after visit negative (Cdiff and GI path panel).Colonoscopy with random colonic biopsies recommended in light of CT findings. Last colonoscopy 2023 with sessile serrated adenoma.   hemorrhoids: history of hemorrhoids in past s/p banding in 2023, possible fissure as well in past.  Recommending colonoscopy as planned and suspect we have maximized results of prior hemorrhoid banding. Wait till colonoscopy to decide on next steps, such as general surgery referral. Need to rule out any IBD underlying.  Dysphagia: chronic. Previously wife had declined EGD. 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  Family history of H.pylori: son has been diagnosed with this. Wife requesting to be checked. At this point, would rather he continue PPI therapy and can do biopsies at time of EGD as would have to hold PPI 2 weeks prior.   Mildly elevated AST: recheck now. Endorses alcohol use intermittently.   Chronic thrombocytopenia: normal liver and spleen historically. Query if r/t alcohol use.      PLAN    Stool studies completed: negative Cdiff and GI profile Requested CMP, alpha gal, celiac: still needs to be done Proceed with colonoscopy. Upper endoscopy/dilation  by Dr. Cindie  in near future: the risks, benefits, and alternatives have been discussed with the patient in detail. The patient states understanding and desires to proceed.  Follow-up with PCP regarding BP: going today Return 3 months regardless   Therisa MICAEL Stager, PhD, [facility] Gastroenterology

## 2023-12-12 NOTE — Patient Instructions (Signed)
 We are arranging a colonoscopy, upper endoscopy, and dilation with Dr. Cindie in the near future.  Please have blood work and stool tests done as soon as you can.   If you have any blurred vision, significant headache, chest pain, go to the emergency room. Definitely contact your primary care to discuss blood pressure management.  We will see you back in 3 months regardless!  I enjoyed seeing you again today! I value our relationship and want to provide genuine, compassionate, and quality care. You may receive a survey regarding your visit with me, and I welcome your feedback! Thanks so much for taking the time to complete this. I look forward to seeing you again.      Therisa MICAEL Stager, PhD, ANP-BC [facility] Gastroenterology

## 2023-12-13 ENCOUNTER — Other Ambulatory Visit: Payer: Self-pay | Admitting: *Deleted

## 2023-12-13 ENCOUNTER — Encounter: Payer: Self-pay | Admitting: *Deleted

## 2023-12-13 ENCOUNTER — Telehealth: Payer: Self-pay | Admitting: *Deleted

## 2023-12-13 MED ORDER — PEG 3350-KCL-NA BICARB-NACL 420 G PO SOLR: 4000.0000 mL | Freq: Once | ORAL | 0 refills | Status: AC

## 2023-12-13 NOTE — Telephone Encounter (Signed)
 PA approved via cohere Authorization #[id number]  DOS: 12/27/2023 - 01/10/2024

## 2023-12-17 LAB — CLOSTRIDIUM DIFFICILE EIA: C difficile Toxins A+B, EIA: NEGATIVE

## 2023-12-18 LAB — GI PROFILE, STOOL, PCR

## 2023-12-20 ENCOUNTER — Emergency Department (HOSPITAL_COMMUNITY): Admission: EM | Admit: 2023-12-20 | Discharge: 2023-12-20 | Discharge status: Against Medical Advice

## 2023-12-21 ENCOUNTER — Emergency Department (HOSPITAL_COMMUNITY): Admission: EM | Admit: 2023-12-21 | Discharge: 2023-12-21 | Discharge status: Against Medical Advice

## 2023-12-21 ENCOUNTER — Emergency Department (HOSPITAL_COMMUNITY)

## 2023-12-21 ENCOUNTER — Other Ambulatory Visit: Payer: Self-pay

## 2023-12-21 ENCOUNTER — Encounter (HOSPITAL_COMMUNITY): Payer: Self-pay

## 2023-12-21 DIAGNOSIS — M79605 Pain in left leg: Secondary | ICD-10-CM | POA: Diagnosis present

## 2023-12-21 DIAGNOSIS — Z5321 Procedure and treatment not carried out due to patient leaving prior to being seen by health care provider: Secondary | ICD-10-CM | POA: Insufficient documentation

## 2023-12-21 NOTE — ED Triage Notes (Signed)
 Pt arrived via [facility] c/o left lower leg pain and swelling for apprx 1 week. Pt verbalizes concern for a possible DVT/blood clot. Pt denies injury and is ambulatory in Triage.

## 2023-12-22 ENCOUNTER — Other Ambulatory Visit: Payer: Self-pay

## 2023-12-22 ENCOUNTER — Encounter (HOSPITAL_COMMUNITY): Payer: Self-pay

## 2023-12-22 ENCOUNTER — Inpatient Hospital Stay (HOSPITAL_COMMUNITY): Admission: RE | Admit: 2023-12-22 | Discharge: 2023-12-22 | Attending: Internal Medicine

## 2023-12-24 ENCOUNTER — Ambulatory Visit: Payer: Self-pay | Admitting: Gastroenterology

## 2023-12-27 ENCOUNTER — Ambulatory Visit (HOSPITAL_COMMUNITY): Admitting: Certified Registered Nurse Anesthetist

## 2023-12-27 ENCOUNTER — Other Ambulatory Visit: Payer: Self-pay

## 2023-12-27 ENCOUNTER — Encounter (HOSPITAL_COMMUNITY): Payer: Self-pay | Admitting: Internal Medicine

## 2023-12-27 ENCOUNTER — Ambulatory Visit (HOSPITAL_COMMUNITY)
Admission: RE | Admit: 2023-12-27 | Discharge: 2023-12-27 | Disposition: A | Attending: Internal Medicine | Admitting: Internal Medicine

## 2023-12-27 ENCOUNTER — Encounter (HOSPITAL_COMMUNITY): Admission: RE | Disposition: A | Payer: Self-pay | Attending: Internal Medicine

## 2023-12-27 DIAGNOSIS — K5900 Constipation, unspecified: Secondary | ICD-10-CM | POA: Diagnosis not present

## 2023-12-27 DIAGNOSIS — I252 Old myocardial infarction: Secondary | ICD-10-CM | POA: Insufficient documentation

## 2023-12-27 DIAGNOSIS — F419 Anxiety disorder, unspecified: Secondary | ICD-10-CM | POA: Insufficient documentation

## 2023-12-27 DIAGNOSIS — I2510 Atherosclerotic heart disease of native coronary artery without angina pectoris: Secondary | ICD-10-CM | POA: Diagnosis not present

## 2023-12-27 DIAGNOSIS — K5790 Diverticulosis of intestine, part unspecified, without perforation or abscess without bleeding: Secondary | ICD-10-CM | POA: Diagnosis not present

## 2023-12-27 DIAGNOSIS — K648 Other hemorrhoids: Secondary | ICD-10-CM | POA: Insufficient documentation

## 2023-12-27 DIAGNOSIS — K2970 Gastritis, unspecified, without bleeding: Secondary | ICD-10-CM | POA: Diagnosis not present

## 2023-12-27 DIAGNOSIS — K219 Gastro-esophageal reflux disease without esophagitis: Secondary | ICD-10-CM | POA: Insufficient documentation

## 2023-12-27 DIAGNOSIS — F17210 Nicotine dependence, cigarettes, uncomplicated: Secondary | ICD-10-CM | POA: Insufficient documentation

## 2023-12-27 DIAGNOSIS — K529 Noninfective gastroenteritis and colitis, unspecified: Secondary | ICD-10-CM | POA: Diagnosis not present

## 2023-12-27 DIAGNOSIS — B379 Candidiasis, unspecified: Secondary | ICD-10-CM

## 2023-12-27 DIAGNOSIS — I10 Essential (primary) hypertension: Secondary | ICD-10-CM | POA: Diagnosis not present

## 2023-12-27 DIAGNOSIS — K5730 Diverticulosis of large intestine without perforation or abscess without bleeding: Secondary | ICD-10-CM | POA: Diagnosis not present

## 2023-12-27 DIAGNOSIS — K222 Esophageal obstruction: Secondary | ICD-10-CM

## 2023-12-27 DIAGNOSIS — F32A Depression, unspecified: Secondary | ICD-10-CM | POA: Insufficient documentation

## 2023-12-27 DIAGNOSIS — Z8673 Personal history of transient ischemic attack (TIA), and cerebral infarction without residual deficits: Secondary | ICD-10-CM | POA: Diagnosis not present

## 2023-12-27 HISTORY — PX: ESOPHAGEAL BRUSHING: SHX6842

## 2023-12-27 HISTORY — PX: ESOPHAGEAL DILATION: SHX303

## 2023-12-27 HISTORY — PX: ESOPHAGOGASTRODUODENOSCOPY: SHX5428

## 2023-12-27 HISTORY — PX: COLONOSCOPY: SHX5424

## 2023-12-27 LAB — KOH PREP

## 2023-12-27 SURGERY — COLONOSCOPY
Anesthesia: Monitor Anesthesia Care

## 2023-12-27 MED ORDER — LACTATED RINGERS IV SOLN: INTRAVENOUS | Status: DC | PRN

## 2023-12-27 MED ORDER — LIDOCAINE 2% (20 MG/ML) 5 ML SYRINGE
INTRAMUSCULAR | Status: DC | PRN
  Administered 2023-12-27: 09:00:00 60 mg via INTRAVENOUS

## 2023-12-27 MED ORDER — PROPOFOL 500 MG/50ML IV EMUL
INTRAVENOUS | Status: DC | PRN
  Administered 2023-12-27: 09:00:00 80 mg via INTRAVENOUS
  Administered 2023-12-27: 09:00:00 150 ug/kg/min via INTRAVENOUS

## 2023-12-27 NOTE — Anesthesia Preprocedure Evaluation (Signed)
 Anesthesia Evaluation  Patient identified by MRN, date of birth, ID band Patient awake    Reviewed: Allergy & Precautions, H&P , NPO status , Patient's Chart, lab work & pertinent test results, reviewed documented beta blocker date and time   Airway Mallampati: II  TM Distance: >3 FB Neck ROM: full    Dental no notable dental hx.    Pulmonary neg pulmonary ROS, Current Smoker and Patient abstained from smoking.   Pulmonary exam normal breath sounds clear to auscultation       Cardiovascular Exercise Tolerance: Good hypertension, + CAD and + Past MI   Rhythm:regular Rate:Normal     Neuro/Psych  Headaches PSYCHIATRIC DISORDERS Anxiety Depression     Neuromuscular disease CVA    GI/Hepatic Neg liver ROS,GERD  ,,  Endo/Other  negative endocrine ROS    Renal/GU Renal disease  negative genitourinary   Musculoskeletal   Abdominal   Peds  Hematology negative hematology ROS (+)   Anesthesia Other Findings   Reproductive/Obstetrics negative OB ROS                              Anesthesia Physical Anesthesia Plan  ASA: 3  Anesthesia Plan: MAC   Post-op Pain Management:    Induction:   PONV Risk Score and Plan: Propofol  infusion  Airway Management Planned:   Additional Equipment:   Intra-op Plan:   Post-operative Plan:   Informed Consent: I have reviewed the patients History and Physical, chart, labs and discussed the procedure including the risks, benefits and alternatives for the proposed anesthesia with the patient or authorized representative who has indicated his/her understanding and acceptance.     Dental Advisory Given  Plan Discussed with: CRNA  Anesthesia Plan Comments:         Anesthesia Quick Evaluation

## 2023-12-27 NOTE — Discharge Instructions (Addendum)
 EGD Discharge instructions Please read the instructions outlined below and refer to this sheet in the next few weeks. These discharge instructions provide you with general information on caring for yourself after you leave the hospital. Your doctor may also give you specific instructions. While your treatment has been planned according to the most current medical practices available, unavoidable complications occasionally occur. If you have any problems or questions after discharge, please call your doctor. ACTIVITY You may resume your regular activity but move at a slower pace for the next 24 hours.  Take frequent rest periods for the next 24 hours.  Walking will help expel (get rid of) the air and reduce the bloated feeling in your abdomen.  No driving for 24 hours (because of the anesthesia (medicine) used during the test).  You may shower.  Do not sign any important legal documents or operate any machinery for 24 hours (because of the anesthesia used during the test).  NUTRITION Drink plenty of fluids.  You may resume your normal diet.  Begin with a light meal and progress to your normal diet.  Avoid alcoholic beverages for 24 hours or as instructed by your caregiver.  MEDICATIONS You may resume your normal medications unless your caregiver tells you otherwise.  WHAT YOU CAN EXPECT TODAY You may experience abdominal discomfort such as a feeling of fullness or gas pains.  FOLLOW-UP Your doctor will discuss the results of your test with you.  SEEK IMMEDIATE MEDICAL ATTENTION IF ANY OF THE FOLLOWING OCCUR: Excessive nausea (feeling sick to your stomach) and/or vomiting.  Severe abdominal pain and distention (swelling).  Trouble swallowing.  Temperature over 101 F (37.8 C).  Rectal bleeding or vomiting of blood.      Colonoscopy Discharge Instructions  Read the instructions outlined below and refer to this sheet in the next few weeks. These discharge instructions provide you  with general information on caring for yourself after you leave the hospital. Your doctor may also give you specific instructions. While your treatment has been planned according to the most current medical practices available, unavoidable complications occasionally occur.   ACTIVITY You may resume your regular activity, but move at a slower pace for the next 24 hours.  Take frequent rest periods for the next 24 hours.  Walking will help get rid of the air and reduce the bloated feeling in your belly (abdomen).  No driving for 24 hours (because of the medicine (anesthesia) used during the test).   Do not sign any important legal documents or operate any machinery for 24 hours (because of the anesthesia used during the test).  NUTRITION Drink plenty of fluids.  You may resume your normal diet as instructed by your doctor.  Begin with a light meal and progress to your normal diet. Heavy or fried foods are harder to digest and may make you feel sick to your stomach (nauseated).  Avoid alcoholic beverages for 24 hours or as instructed.  MEDICATIONS You may resume your normal medications unless your doctor tells you otherwise.  WHAT YOU CAN EXPECT TODAY Some feelings of bloating in the abdomen.  Passage of more gas than usual.  Spotting of blood in your stool or on the toilet paper.  IF YOU HAD POLYPS REMOVED DURING THE COLONOSCOPY: No aspirin  products for 7 days or as instructed.  No alcohol for 7 days or as instructed.  Eat a soft diet for the next 24 hours.  FINDING OUT THE RESULTS OF YOUR TEST Not all test results  are available during your visit. If your test results are not back during the visit, make an appointment with your caregiver to find out the results. Do not assume everything is normal if you have not heard from your caregiver or the medical facility. It is important for you to follow up on all of your test results.  SEEK IMMEDIATE MEDICAL ATTENTION IF: You have more than a  spotting of blood in your stool.  Your belly is swollen (abdominal distention).  You are nauseated or vomiting.  You have a temperature over 101.  You have abdominal pain or discomfort that is severe or gets worse throughout the day.   Your EGD revealed mild amount inflammation in your stomach.  I took biopsies of this to rule out infection with a bacteria called H. pylori.  I also took samples of your esophagus.  Small bowel was normal.  You also had a tightening of your esophagus called a Schatzki's ring.  This is a benign ring related to acid reflux.  I stretched this out today.  Hopefully this helps with feeling of food getting stuck.  Continue on pantoprazole  daily.  Your colonoscopy was relatively unremarkable.  I did not find any polyps or evidence of colon cancer.    Given your age, I do not think you need further colonoscopies for polyp surveillance.   Overall, your colon appeared very healthy.  I did not see any active inflammation indicative of underlying inflammatory bowel disease such as Crohn's disease or ulcerative colitis throughout your colon or end portion of your small bowel.  I took biopsies of your colon to further evaluate.    Await pathology results, my office will contact you.  Follow up in GI office in 2-3 months  I hope you have a great rest of your week!  Carlin POUR. Cindie, D.O. Gastroenterology and Hepatology [facility]

## 2023-12-27 NOTE — Op Note (Signed)
 [facility] Patient Name: Alex Lara Procedure Date: 12/27/2023 8:43 AM MRN: [id number] Date of Birth: 1940-08-21 Attending MD: Carlin POUR. Cindie , [place], [id number] CSN: [id number] Age: 83 Admit Type: Outpatient Procedure:                Colonoscopy Indications:              Chronic diarrhea, Follow-up of colitis Providers:                Carlin POUR. Cindie, DO, Devere Lodge, Chad Wilson,                            Technician Referring MD:              Medicines:                See the Anesthesia note for documentation of the                            administered medications Complications:            No immediate complications. Estimated Blood Loss:     Estimated blood loss was minimal. Procedure:                Pre-Anesthesia Assessment:                           - The anesthesia plan was to use monitored                            anesthesia care (MAC).                           After obtaining informed consent, the colonoscope                            was passed under direct vision. Throughout the                            procedure, the patient's blood pressure, pulse, and                            oxygen saturations were monitored continuously. The                            [id number]-HQ190L ([id number]) was introduced through the                            anus and advanced to the the terminal ileum, with                            identification of the appendiceal orifice and IC                            valve. The colonoscopy was performed without                            difficulty. The patient tolerated the procedure  well. The quality of the bowel preparation was                            evaluated using the BBPS [facility] Bowel Preparation                            Scale) with scores of: Right Colon = 2 (minor                            amount of residual staining, small fragments of                            stool and/or opaque liquid, but  mucosa seen well),                            Transverse Colon = 2 (minor amount of residual                            staining, small fragments of stool and/or opaque                            liquid, but mucosa seen well) and Left Colon = 2                            (minor amount of residual staining, small fragments                            of stool and/or opaque liquid, but mucosa seen                            well). The total BBPS score equals 6. The quality                            of the bowel preparation was good. Scope In: 9:09:32 AM Scope Out: 9:21:44 AM Scope Withdrawal Time: 0 hours 9 minutes 2 seconds  Total Procedure Duration: 0 hours 12 minutes 12 seconds  Findings:      Non-bleeding internal hemorrhoids were found.      Multiple large-mouthed and small-mouthed diverticula were found in the       sigmoid colon.      The terminal ileum appeared normal.      Biopsies for histology were taken with a cold forceps from the ascending       colon, transverse colon and descending colon for evaluation of       microscopic colitis. Impression:               - Non-bleeding internal hemorrhoids.                           - Diverticulosis in the sigmoid colon.                           - The examined portion of the ileum was normal.                           -  Biopsies were taken with a cold forceps from the                            ascending colon, transverse colon and descending                            colon for evaluation of microscopic colitis. Moderate Sedation:      Per Anesthesia Care Recommendation:           - Patient has a contact number available for                            emergencies. The signs and symptoms of potential                            delayed complications were discussed with the                            patient. Return to normal activities tomorrow.                            Written discharge instructions were provided to the                             patient.                           - Resume previous diet.                           - Continue present medications.                           - Await pathology results.                           - No repeat colonoscopy due to age.                           - Return to GI clinic in 3 months. Procedure Code(s):        --- Professional ---                           [phone], Colonoscopy, flexible; with biopsy, single                            or multiple Diagnosis Code(s):        --- Professional ---                           K64.8, Other hemorrhoids                           K52.9, Noninfective gastroenteritis and colitis,                            unspecified  K57.30, Diverticulosis of large intestine without                            perforation or abscess without bleeding CPT copyright 2022 American [employer]. All rights reserved. The codes documented in this report are preliminary and upon coder review may  be revised to meet current compliance requirements. Carlin POUR. Cindie, DO Carlin POUR. Cindie, DO 12/27/2023 9:29:52 AM This report has been signed electronically. Number of Addenda: 0

## 2023-12-27 NOTE — Interval H&P Note (Signed)
 History and Physical Interval Note:  12/27/2023 8:39 AM  Pas[place] VEAR Glendia Mickey.  has presented today for surgery, with the diagnosis of PROCTOCOLITIS, DYSPHAGIA.  The various methods of treatment have been discussed with the patient and family. After consideration of risks, benefits and other options for treatment, the patient has consented to  Procedures with comments: COLONOSCOPY (N/A) - 945AM, ASA 3 EGD (ESOPHAGOGASTRODUODENOSCOPY) (N/A) DILATION, ESOPHAGUS (N/A) as a surgical intervention.  The patient's history has been reviewed, patient examined, no change in status, stable for surgery.  I have reviewed the patient's chart and labs.  Questions were answered to the patient's satisfaction.     Carlin MARLA Hasty

## 2023-12-27 NOTE — Op Note (Addendum)
 [facility] Patient Name: Alex Lara Procedure Date: 12/27/2023 8:43 AM MRN: [id number] Date of Birth: May 18, 1940 Attending MD: Carlin POUR. Cindie , [place], [id number] CSN: [id number] Age: 83 Admit Type: Outpatient Procedure:                Upper GI endoscopy Indications:              Dysphagia Providers:                Carlin POUR. Cindie, DO, Devere Lodge, Chad Wilson,                            Technician Referring MD:              Medicines:                See the Anesthesia note for documentation of the                            administered medications Complications:            No immediate complications. Estimated Blood Loss:     Estimated blood loss was minimal. Procedure:                Pre-Anesthesia Assessment:                           - The anesthesia plan was to use monitored                            anesthesia care (MAC).                           After obtaining informed consent, the endoscope was                            passed under direct vision. Throughout the                            procedure, the patient's blood pressure, pulse, and                            oxygen saturations were monitored continuously. The                            [id number]-[id number] ([id number]) scope was introduced through                            the mouth, and advanced to the second part of                            duodenum. The upper GI endoscopy was accomplished                            without difficulty. The patient tolerated the                            procedure well. Scope In: 8:56:48 AM Scope Out:  9:04:48 AM Total Procedure Duration: 0 hours 8 minutes 0 seconds  Findings:      A mild Schatzki ring was found in the distal esophagus. A TTS dilator       was passed through the scope. Dilation with an 18-19-20 mm balloon       dilator was performed to 19 mm. The dilation site was examined and       showed mild mucosal disruption and moderate improvement in luminal        narrowing.      Patchy inflammation was found in the stomach. Biopsies were taken with a       cold forceps for Helicobacter pylori testing.      The duodenal bulb, first portion of the duodenum and second portion of       the duodenum were normal.      White nummular lesions were noted in the middle third of the esophagus.       Cells for cytology were obtained by brushing. Impression:               - Mild Schatzki ring. Dilated.                           - Gastritis. Biopsied.                           - Normal duodenal bulb, first portion of the                            duodenum and second portion of the duodenum.                           - White nummular lesions in esophageal mucosa.                            Cells for cytology obtained. Moderate Sedation:      Per Anesthesia Care Recommendation:           - Patient has a contact number available for                            emergencies. The signs and symptoms of potential                            delayed complications were discussed with the                            patient. Return to normal activities tomorrow.                            Written discharge instructions were provided to the                            patient.                           - Resume previous diet.                           -  Continue present medications.                           - Await pathology results.                           - Repeat upper endoscopy PRN for retreatment.                           - Return to GI clinic in 3 months.                           - Use Protonix  (pantoprazole ) 40 mg PO daily.                           - Treat for candidal esophagitis if cytology                            positive                           **PICTURES FROM PROCEDURE DID NOT TRANSFER OVER TO                            PROVATION** Procedure Code(s):        --- Professional ---                           [phone], Esophagogastroduodenoscopy, flexible,                             transoral; with transendoscopic balloon dilation of                            esophagus (less than 30 mm diameter)                           43239, 59, Esophagogastroduodenoscopy, flexible,                            transoral; with biopsy, single or multiple Diagnosis Code(s):        --- Professional ---                           K22.2, Esophageal obstruction                           K29.70, Gastritis, unspecified, without bleeding                           R13.10, Dysphagia, unspecified CPT copyright 2022 American Medical Association. All rights reserved. The codes documented in this report are preliminary and upon coder review may  be revised to meet current compliance requirements. Carlin POUR. Cindie, DO Carlin POUR. Cindie, DO 12/27/2023 9:27:36 AM This report has been signed electronically. Number of Addenda: 0

## 2023-12-27 NOTE — Transfer of Care (Signed)
 Immediate Anesthesia Transfer of Care Note  Patient: Alex Lara.  Procedure(s) Performed: COLONOSCOPY EGD (ESOPHAGOGASTRODUODENOSCOPY) DILATION, ESOPHAGUS ESOPHAGOSCOPY, WITH BRUSH BIOPSY  Patient Location: Short Stay  Anesthesia Type:General  Level of Consciousness: awake  Airway & Oxygen Therapy: Patient Spontanous Breathing  Post-op Assessment: Report given to RN and Post -op Vital signs reviewed and stable  Post vital signs: Reviewed and stable  Last Vitals:  Vitals Value Taken Time  BP 108/53 12/27/23 09:27  Temp 36.4 C 12/27/23 09:27  Pulse 56 12/27/23 09:27  Resp 15 12/27/23 09:27  SpO2 100 % 12/27/23 09:27    Last Pain:  Vitals:   12/27/23 0927  TempSrc: Oral  PainSc: 0-No pain         Complications: No notable events documented.

## 2023-12-28 ENCOUNTER — Encounter (HOSPITAL_COMMUNITY): Payer: Self-pay | Admitting: Internal Medicine

## 2023-12-28 LAB — SURGICAL PATHOLOGY

## 2023-12-29 NOTE — Anesthesia Postprocedure Evaluation (Signed)
"   Anesthesia Post Note  Patient: Waylen Depaolo.  Procedure(s) Performed: COLONOSCOPY EGD (ESOPHAGOGASTRODUODENOSCOPY) DILATION, ESOPHAGUS ESOPHAGOSCOPY, WITH BRUSH BIOPSY  Patient location during evaluation: Phase II Anesthesia Type: MAC Level of consciousness: awake Pain management: pain level controlled Vital Signs Assessment: post-procedure vital signs reviewed and stable Respiratory status: spontaneous breathing and respiratory function stable Cardiovascular status: blood pressure returned to baseline and stable Postop Assessment: no headache and no apparent nausea or vomiting Anesthetic complications: no Comments: Late entry   No notable events documented.   Last Vitals:  Vitals:   12/27/23 0738 12/27/23 0927  BP: (!) 167/79 (!) 108/53  Pulse: (!) 55 (!) 56  Resp: (!) 25 15  Temp: 36.9 C (!) 36.4 C  SpO2: 100% 100%    Last Pain:  Vitals:   12/28/23 1244  TempSrc:   PainSc: 0-No pain                 Yvonna PARAS Kaley Jutras      "

## 2024-01-01 ENCOUNTER — Encounter: Payer: Self-pay | Admitting: Pain Medicine

## 2024-01-01 ENCOUNTER — Ambulatory Visit: Attending: Pain Medicine | Admitting: Pain Medicine

## 2024-01-01 VITALS — BP 165/86 | HR 62 | Temp 97.3°F | Resp 18 | Ht 72.0 in | Wt 176.0 lb

## 2024-01-01 DIAGNOSIS — M549 Dorsalgia, unspecified: Secondary | ICD-10-CM | POA: Diagnosis present

## 2024-01-01 DIAGNOSIS — M5412 Radiculopathy, cervical region: Secondary | ICD-10-CM | POA: Diagnosis present

## 2024-01-01 DIAGNOSIS — M25511 Pain in right shoulder: Secondary | ICD-10-CM | POA: Insufficient documentation

## 2024-01-01 DIAGNOSIS — Z7982 Long term (current) use of aspirin: Secondary | ICD-10-CM | POA: Diagnosis present

## 2024-01-01 DIAGNOSIS — M79605 Pain in left leg: Secondary | ICD-10-CM | POA: Diagnosis present

## 2024-01-01 DIAGNOSIS — M25512 Pain in left shoulder: Secondary | ICD-10-CM | POA: Diagnosis present

## 2024-01-01 DIAGNOSIS — S13140S Subluxation of C3/C4 cervical vertebrae, sequela: Secondary | ICD-10-CM | POA: Diagnosis present

## 2024-01-01 DIAGNOSIS — M79604 Pain in right leg: Secondary | ICD-10-CM | POA: Diagnosis present

## 2024-01-01 DIAGNOSIS — M47812 Spondylosis without myelopathy or radiculopathy, cervical region: Secondary | ICD-10-CM | POA: Insufficient documentation

## 2024-01-01 DIAGNOSIS — M542 Cervicalgia: Secondary | ICD-10-CM | POA: Diagnosis present

## 2024-01-01 DIAGNOSIS — M4802 Spinal stenosis, cervical region: Secondary | ICD-10-CM | POA: Insufficient documentation

## 2024-01-01 DIAGNOSIS — R937 Abnormal findings on diagnostic imaging of other parts of musculoskeletal system: Secondary | ICD-10-CM | POA: Insufficient documentation

## 2024-01-01 DIAGNOSIS — G4486 Cervicogenic headache: Secondary | ICD-10-CM | POA: Insufficient documentation

## 2024-01-01 DIAGNOSIS — G8929 Other chronic pain: Secondary | ICD-10-CM | POA: Insufficient documentation

## 2024-01-01 NOTE — Patient Instructions (Signed)
 " ______________________________________________________________________    Procedure instructions  Stop blood-thinners  Do not eat or drink fluids (other than water ) for 8 hours before your procedure  No water  for 2 hours before your procedure  Take your blood pressure medicine with a sip of water   Arrive 30 minutes before your appointment  If sedation is planned, bring suitable driver. [place], [place], & public transportation are NOT APPROVED)  Carefully read the Preparing for your procedure detailed instructions  If you have questions call us  at [phone]  Procedure appointments are for procedures only.   NO medication refills or new problem evaluations will be done on procedure days.   Only the scheduled, pre-approved procedure and side will be done.   ______________________________________________________________________     ______________________________________________________________________    Preparing for your procedure  Appointments: If you think you may not be able to keep your appointment, call 24-48 hours in advance to cancel. We need time to make it available to others.  Procedure visits are for procedures only. During your procedure appointment there will be: NO Prescription Refills*. NO medication changes or discussions*. NO discussion of disability issues*. NO unrelated pain problem evaluations*. NO evaluations to order other pain procedures*. *These will be addressed at a separate and distinct evaluation encounter on the provider's evaluation schedule and not during procedure days.  Instructions: Food intake: Avoid eating anything solid for at least 8 hours prior to your procedure. Clear liquid intake: You may take clear liquids such as water  up to 2 hours prior to your procedure. (No carbonated drinks. No soda.) Transportation: Unless otherwise stated by your physician, bring a driver. (Driver cannot be a Market Researcher, Pharmacist, Community, or any other form of public  transportation.) Morning Medicines: Except for blood thinners, take all of your other morning medications with a sip of water . Make sure to take your heart and blood pressure medicines. If your blood pressure's lower number is above 100, the case will be rescheduled. Blood thinners: Make sure to stop your blood thinners as instructed.  If you take a blood thinner, but were not instructed to stop it, call our office [phone] and ask to talk to a nurse. Not stopping a blood thinner prior to certain procedures could lead to serious complications. Diabetics on insulin: Notify the staff so that you can be scheduled 1st case in the morning. If your diabetes requires high dose insulin, take only  of your normal insulin dose the morning of the procedure and notify the staff that you have done so. Preventing infections: Shower with an antibacterial soap the morning of your procedure.  Build-up your immune system: Take 1000 mg of Vitamin C with every meal (3 times a day) the day prior to your procedure. Antibiotics: Inform the nursing staff if you are taking any antibiotics or if you have any conditions that may require antibiotics prior to procedures. (Example: recent joint implants)   Pregnancy: If you are pregnant make sure to notify the nursing staff. Not doing so may result in injury to the fetus, including death.  Sickness: If you have a cold, fever, or any active infections, call and cancel or reschedule your procedure. Receiving steroids while having an infection may result in complications. Arrival: You must be in the facility at least 30 minutes prior to your scheduled procedure. Tardiness: Your scheduled time is also the cutoff time. If you do not arrive at least 15 minutes prior to your procedure, you will be rescheduled.  Children: Do not bring any children  with you. Make arrangements to keep them home. Dress appropriately: There is always a possibility that your clothing may get soiled. Avoid  long dresses. Valuables: Do not bring any jewelry or valuables.  Reasons to call and reschedule or cancel your procedure: (Following these recommendations will minimize the risk of a serious complication.) Surgeries: Avoid having procedures within 2 weeks of any surgery. (Avoid for 2 weeks before or after any surgery). Flu Shots: Avoid having procedures within 2 weeks of a flu shots or . (Avoid for 2 weeks before or after immunizations). Barium: Avoid having a procedure within 7-10 days after having had a radiological study involving the use of radiological contrast. (Myelograms, Barium swallow or enema study). Heart attacks: Avoid any elective procedures or surgeries for the initial 6 months after a Myocardial Infarction (Heart Attack). Blood thinners: It is imperative that you stop these medications before procedures. Let us  know if you if you take any blood thinner.  Infection: Avoid procedures during or within two weeks of an infection (including chest colds or gastrointestinal problems). Symptoms associated with infections include: Localized redness, fever, chills, night sweats or profuse sweating, burning sensation when voiding, cough, congestion, stuffiness, runny nose, sore throat, diarrhea, nausea, vomiting, cold or Flu symptoms, recent or current infections. It is specially important if the infection is over the area that we intend to treat. Heart and lung problems: Symptoms that may suggest an active cardiopulmonary problem include: cough, chest pain, breathing difficulties or shortness of breath, dizziness, ankle swelling, uncontrolled high or unusually low blood pressure, and/or palpitations. If you are experiencing any of these symptoms, cancel your procedure and contact your primary care physician for an evaluation.  Remember:  Regular Business hours are:  Monday to Thursday 8:00 AM to 4:00 PM  Provider's Schedule: Eric Como, MD:  Procedure days: Tuesday and Thursday 7:30  AM to 4:00 PM  Wallie Sherry, MD:  Procedure days: Monday and Wednesday 7:30 AM to 4:00 PM Last  Updated: 12/20/2022 ______________________________________________________________________     ______________________________________________________________________    General Risks and Possible Complications  Patient Responsibilities: It is important that you read this as it is part of your informed consent. It is our duty to inform you of the risks and possible complications associated with treatments offered to you. It is your responsibility as a patient to read this and to ask questions about anything that is not clear or that you believe was not covered in this document.  Patients Rights: You have the right to refuse treatment. You also have the right to change your mind, even after initially having agreed to have the treatment done. However, under this last option, if you wait until the last second to change your mind, you may be charged for the materials used up to that point.  Introduction: Medicine is not an visual merchandiser. Everything in Medicine, including the lack of treatment(s), carries the potential for danger, harm, or loss (which is by definition: Risk). In Medicine, a complication is a secondary problem, condition, or disease that can aggravate an already existing one. All treatments carry the risk of possible complications. The fact that a side effects or complications occurs, does not imply that the treatment was conducted incorrectly. It must be clearly understood that these can happen even when everything is done following the highest safety standards.  No treatment: You can choose not to proceed with the proposed treatment alternative. The PRO(s) would include: avoiding the risk of complications associated with the therapy. The CON(s) would  include: not getting any of the treatment benefits. These benefits fall under one of three categories: diagnostic; therapeutic; and/or  palliative. Diagnostic benefits include: getting information which can ultimately lead to improvement of the disease or symptom(s). Therapeutic benefits are those associated with the successful treatment of the disease. Finally, palliative benefits are those related to the decrease of the primary symptoms, without necessarily curing the condition (example: decreasing the pain from a flare-up of a chronic condition, such as incurable terminal cancer).  General Risks and Complications: These are associated to most interventional treatments. They can occur alone, or in combination. They fall under one of the following six (6) categories: no benefit or worsening of symptoms; bleeding; infection; nerve damage; allergic reactions; and/or death. No benefits or worsening of symptoms: In Medicine there are no guarantees, only probabilities. No healthcare provider can ever guarantee that a medical treatment will work, they can only state the probability that it may. Furthermore, there is always the possibility that the condition may worsen, either directly, or indirectly, as a consequence of the treatment. Bleeding: This is more common if the patient is taking a blood thinner, either prescription or over the counter (example: Goody Powders, Fish oil, Aspirin , Garlic, etc.), or if suffering a condition associated with impaired coagulation (example: Hemophilia, cirrhosis of the liver, low platelet counts, etc.). However, even if you do not have one on these, it can still happen. If you have any of these conditions, or take one of these drugs, make sure to notify your treating physician. Infection: This is more common in patients with a compromised immune system, either due to disease (example: diabetes, cancer, human immunodeficiency virus [HIV], etc.), or due to medications or treatments (example: therapies used to treat cancer and rheumatological diseases). However, even if you do not have one on these, it can still  happen. If you have any of these conditions, or take one of these drugs, make sure to notify your treating physician. Nerve Damage: This is more common when the treatment is an invasive one, but it can also happen with the use of medications, such as those used in the treatment of cancer. The damage can occur to small secondary nerves, or to large primary ones, such as those in the spinal cord and brain. This damage may be temporary or permanent and it may lead to impairments that can range from temporary numbness to permanent paralysis and/or brain death. Allergic Reactions: Any time a substance or material comes in contact with our body, there is the possibility of an allergic reaction. These can range from a mild skin rash (contact dermatitis) to a severe systemic reaction (anaphylactic reaction), which can result in death. Death: In general, any medical intervention can result in death, most of the time due to an unforeseen complication. ______________________________________________________________________      ______________________________________________________________________    Blood Thinners  IMPORTANT NOTICE:  If you take any of these, make sure to notify the nursing staff.  Failure to do so may result in serious injury.  Recommended time intervals to stop and restart blood-thinners, before & after invasive procedures  Generic Name Brand Name Pre-procedure: Stop medication for this amount of time before your procedure: Post-procedure: Wait this amount of time after the procedure before restarting your medication:  Abciximab Reopro 15 days 2 hrs  Alteplase Activase 10 days 10 days  Anagrelide Agrylin    Apixaban Eliquis 3 days 6 hrs  Cilostazol Pletal 3 days 5 hrs  Clopidogrel  Plavix  7-10 days 2 hrs  Dabigatran Pradaxa 5 days 6 hrs  Dalteparin Fragmin 24 hours 4 hrs  Dipyridamole Aggrenox 11days 2 hrs  Edoxaban Lixiana; Savaysa 3 days 2 hrs  Enoxaparin   Lovenox  24 hours 4 hrs   Eptifibatide Integrillin 8 hours 2 hrs  Fondaparinux  Arixtra 72 hours 12 hrs  Hydroxychloroquine Plaquenil 11 days   Prasugrel Effient 7-10 days 6 hrs  Reteplase Retavase 10 days 10 days  Rivaroxaban Xarelto 3 days 6 hrs  Ticagrelor Brilinta 5-7 days 6 hrs  Ticlopidine Ticlid 10-14 days 2 hrs  Tinzaparin Innohep 24 hours 4 hrs  Tirofiban Aggrastat 8 hours 2 hrs  Warfarin Coumadin 5 days 2 hrs   Other medications with blood-thinning effects  NOTE: Consider stopping these if you have prolonged bleeding despite not taking any of the above blood thinners. Otherwise ask your provider and this will be decided on a case-by-case basis.  Product indications Generic (Brand) names Note  Cholesterol Lipitor Stop 4 days before procedure  Blood thinner (injectable) Heparin (LMW or LMWH Heparin) Stop 24 hours before procedure  Cancer Ibrutinib (Imbruvica) Stop 7 days before procedure  Malaria/Rheumatoid Hydroxychloroquine (Plaquenil) Stop 11 days before procedure  Thrombolytics  10 days before or after procedures   Over-the-counter (OTC) Products with blood-thinning effects  NOTE: Consider stopping these if you have prolonged bleeding despite not taking any of the above blood thinners. Otherwise ask your provider and this will be decided on a case-by-case basis.  Product Common names Stop Time  Aspirin  > 325 mg Goody Powders, Excedrin, etc. 11 days  Aspirin  <= 81 mg  7 days  Fish oil  4 days  Garlic supplements  7 days  Ginkgo biloba  36 hours  Ginseng  24 hours  NSAIDs Ibuprofen, Naprosyn, etc. 3 days  Vitamin E  4 days   ______________________________________________________________________     ______________________________________________________________________    Steroid injections  Common steroids for injections Triamcinolone: Used by many sports medicine physicians for large joint and bursal injections, often combined with a local anesthetic like lidocaine . A study focusing on  coccydynia (tailbone pain) found triamcinolone was more effective than betamethasone, suggesting it may also be preferable for other localized inflammation conditions. Methylprednisolone : A common alternative to triamcinolone that is also a strong anti-inflammatory. It is available in different formulations, with the acetate suspension being the long-acting option for intra-articular injections. Dexamethasone : This is a non-particulate steroid, meaning it has a lower risk of tissue damage compared to particulate steroids like triamcinolone and methylprednisolone . While less common for this specific use, it is an option for targeted injections.   Considerations for physicians Particulate vs. non-particulate steroids: Triamcinolone and methylprednisolone  are particulate, meaning they can clump together. Dexamethasone  is non-particulate. Particulate steroids are often preferred for their longer-lasting effects but carry a theoretical higher risk for certain injections (though this is less of a concern in the costochondral joints). Combined injectate: Corticosteroids are typically mixed with a local anesthetic like lidocaine  to provide both immediate pain relief (from the anesthetic) and longer-term inflammation reduction (from the steroid). Imaging guidance: To ensure accurate placement of the needle and medication, physicians may use ultrasound or fluoroscopic guidance for the injection, especially in complex or refractory cases.   Patient guidance Before undergoing a steroid injection, discuss the options with your physician. They will determine the best steroid, dosage, and procedure for your specific case based on factors like: Severity of your condition History of response to other treatments Your overall health status Experience and preference of the physician  Last  Updated: 09/05/2023 ______________________________________________________________________     "

## 2024-01-01 NOTE — Progress Notes (Addendum)
 PROVIDER NOTE: Interpretation of information contained herein should be left to medically-trained personnel. Specific patient instructions are provided elsewhere under Patient Instructions section of medical record. This document was created in part using AI and STT-dictation technology, any transcriptional errors that may result from this process are unintentional.  Patient: Alex Lara.  Service: E/M   PCP: Sheryle Carwin, MD  DOB: 07/07/1940  DOS: 01/01/2024  Provider: Eric DELENA Como, MD  MRN: [id number]  Delivery: Face-to-face  Specialty: Interventional Pain Management  Type: Established Patient  Setting: Ambulatory outpatient facility  Specialty designation: 09  Referring Prov.: Sheryle Carwin, MD  Location: Outpatient office facility       Primary Reason(s) for Visit: Encounter for evaluation before starting new chronic pain management plan of care (Level of risk: moderate) CC: Neck Pain and Back Pain (lower)  HPI  Alex Lara is a 83 y.o. year old, male patient, who comes today for a follow-up evaluation to review the test results and decide on a treatment plan. He has Coronary artery disease involving native coronary artery of native heart without angina pectoris; Hyperlipidemia; Tobacco abuse; Deep vein thrombosis (HCC); Gastroesophageal reflux disease; Hypertension; Chest tightness; Dyspnea on exertion; Malignant neoplasm of prostate (HCC); Stroke-like symptoms; Stroke [facility]); Benign prostatic hyperplasia with urinary obstruction; Depression with anxiety; Erectile dysfunction due to arterial insufficiency; Weak urinary stream; Rectal pain; Bradycardia; Hypotension; Aortic aneurysm; AKI (acute kidney injury); Hemorrhoids; Grade II hemorrhoids; Prolapsed internal hemorrhoids, grade 3; Change in bowel habits; Dysphagia; Chronic pain syndrome; Pharmacologic therapy; Disorder of skeletal system; Problems influencing health status; Chronic neck and back pain (1ry area of Pain) (Bilateral);  Cervicalgia; Neck pain of over 3 months duration; Neck pain (Bilateral); Chronic neck pain, posterior; Neck and shoulder pain (Bilateral); Chronic shoulder pain (2ry area of Pain) (Bilateral); Chronic shoulder radicular pain (Bilateral); Chronic lower extremity pain (3ry area of Pain) (Bilateral) (L>R); Chronic radicular pain of lower extremity (Bilateral); Lumbosacral radiculopathy at L5 (Right); Lumbosacral radiculopathy at S1 (Left); Failed back surgical syndrome; History of back surgery; Cervicogenic headache (Bilateral); Occipital headache (GON) (Bilateral); Cervical facet joint pain (Bilateral); Chronic knee pain (Bilateral); Chronic upper extremity pain (Bilateral); Musculoskeletal disorder involving upper trapezius muscle (Bilateral); Abnormal MRI, cervical spine (11/12/2023); Abnormal MRI, lumbar spine (11/12/2023); DDD (degenerative disc disease), cervical; Cervical facet arthropathy (Multilevel) (Bilateral); Cervical foraminal stenosis (Bilateral: C3-4) (Right: C4-5,T1-2) (Left:C2-3); Cervical Posterior Subluxation of C3-C4 cervical vertebrae (1.5 mm), sequela; Tendinopathy of rotator cuff (Left); Supraspinatus tendon tear (Left); Osteoarthritis of AC (acromioclavicular) joint (Left); Arthralgia of acromioclavicular joint (Left); Acromioclavicular joint pain (Left); Subacromial bursitis of shoulder joint (Left); Subdeltoid bursitis (Left); Osteoarthritis involving multiple joints; Complete tear of long head of biceps tendon, sequela (Left); Osteoarthritis of AC (acromioclavicular) joint (Right); Osteopenia determined by x-ray; Osteopenia of shoulder (Left); Osteoarthritis of shoulders (Bilateral); Osteoarthritis of glenohumeral joints (Bilateral); Osteoarthritis of acromioclavicular joints (Bilateral); DDD (degenerative disc disease), thoracic; Wedge compression fracture of T4 thoracic vertebra, sequela; Wedge compression fracture of T5 thoracic vertebra, sequela; Wedge compression fracture of T9 thoracic  vertebra, sequela; Wedge compression fracture of T11 thoracic vertebra, sequela; Thoracic compression fracture (T4, T5, T9, T11), sequela; Bertolotti's syndrome; DDD (degenerative disc disease), lumbar; Focal fatty atrophy of iliacus and psoas muscle (Left); Sacrum and L5 Fatty infiltration of bone marrow (XRT); (AAA) Infrarenal Abdominal Aortic Aneurysm, 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.7 cm); Lumbar facet arthropathy (Multilevel) (Bilateral); Lumbar facet joint pain; 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 His primarily concern today is the Neck Pain and Back Pain (lower)  Pain Assessment: Location:   Neck Radiating: both shoulders Onset: More than a month ago Duration: Chronic pain Quality: Pressure Severity: 5 /10 (subjective, self-reported pain score)  Effect on ADL:   Timing: Constant Modifying factors: medications BP: (!) 165/86  HR: 62  Alex Lara comes in today for a follow-up visit after his initial evaluation on 11/02/2023. Today we went over the results of his tests. These were explained in Layman's terms. During today's appointment we went over my diagnostic impression, as well as the proposed treatment plan.  Review of initial evaluation (11/02/2023): Alex Lara. [name] is an 83 year old male with spinal stenosis who presents with chronic neck and shoulder pain. Referred by Dr. Margrette, an orthopedic specialist, for evaluation of spinal stenosis and pain management.   He experiences chronic neck pain radiating to both shoulders, with the most severe pain in the neck. The pain is constant and accompanied by headaches radiating  from the back of the head to the top. He applies a lidocaine  cream four times daily, which provides some relief. He has not undergone neck surgeries, injections, or physical therapy for this pain.   He has bilateral shoulder pain, with the left shoulder being more painful. The left arm pain is constant, extending over the biceps to the elbow, without numbness or weakness. The right arm has intermittent pain extending to the elbow, causing nocturnal numbness in all fingers. He underwent successful carpal tunnel surgery on the left hand over ten years ago and maintains dexterity in both hands.   He experiences lower back pain radiating down the left leg to the little toe and the right leg to the big toe. He had L5 disc surgery over thirty years ago, involving disc material removal. The leg pain is constant, with the left leg being worse. He received cortisone injections in both knees and shoulders, with recent shoulder injections last week and knee injections last month, but reports no improvement in knee pain.   Review of diagnostic test ordered on 11/02/2023:  Diagnostic lab work: Completely within normal limits Diagnostic imaging: Diagnostic x-rays of the cervical spine with flexion and extension views demonstrated degenerative disc disease with mild multilevel facet arthropathy.  Aortic and carotid calcifications noted. Diagnostic bilateral shoulder x-rays demonstrated glenohumeral and acromioclavicular degenerative changes, bilaterally.  Diagnostic bilateral knee x-rays demonstrated degenerative osteoarthritis of both knees with evidence of chondrocalcinosis on the right knee and left tricompartmental osteoarthritis.  Diagnostic x-rays of the lumbar spine with flexion and extension views demonstrated slight worsening of multilevel lower thoracic and lumbar degenerative disc disease and a 4 cm infrarenal aortic abdominal aneurysm.  MRI of the cervical and lumbar spine as below: (11/12/2023) CERVICAL  SPINE MRI FINDINGS: IMPRESSION: 1. C3-4: prominent left and moderate right foraminal stenosis due to uncinate spurring and mild disc bulge. 2. C4-5: prominent right foraminal stenosis and borderline central stenosis due to uncinate facet spurring along with disc bulge. 3. C2-3: moderate left foraminal stenosis due to facet arthropathy and mild uncinate spurring. 4. T1-2: moderate right foraminal stenosis due to facet spurring and uncinate spurring, with minimal disc bulge.   (11/12/2023) LUMBAR SPINE MRI FINDINGS: INCIDENTAL FINDINGS: Bilateral fluid signal intensity renal cysts warrant no further imaging workup. Infrarenal abdominal  aortic aneurysm 3.7 cm anterior to posterior dimension less suspected internal mural thrombus.   IMPRESSION: 1. Moderate central stenosis and moderate displacement of the right L3 nerve in the lateral extraforaminal space at L3-4 due to disc bulge, intervertebral spurring, and facet arthropathy. 2. Moderate left foraminal stenosis and moderate displacement of the left L4 nerve in the lateral extraforaminal space at L4-5 due to intervertebral spurring and facet arthropathy. Prior right laminectomy at L4. 3. Moderate right subarticular lateral recess stenosis and borderline right foraminal stenosis at L2-3 due to facet arthropathy, disc bulge, and mild intervertebral spurring. 4. Please note that the L5 vertebra is sacralized and the T12 ribs are hypoplastic. Careful correlation with the numbering strategy is recommended if procedural intervention is to be performed. 5. Infrarenal abdominal aortic aneurysm measuring 3.7 cm in anterior to posterior dimension with suspected internal mural thrombus. Recommendation is surveillance with a 2-year nterval.  Discussed the use of AI scribe software for clinical note transcription with the patient, who gave verbal consent to proceed.  History of Present Illness   Alex Lara. [name] is an 83 year old male who presents  with chronic neck and shoulder pain. He was referred by Dr. Margrette for evaluation of his chronic neck and shoulder pain.  He has constant neck pain that radiates to both shoulders, worst in the neck, with headaches from the occiput to the vertex. He uses lidocaine  cream four times daily with partial relief. He has not had neck surgery, injections, or physical therapy.  He also has bilateral shoulder pain, left worse than right, with constant left arm pain from biceps to elbow and intermittent right arm pain to the elbow. He has nocturnal numbness of all fingers. He had prior left carpal tunnel surgery about 10 years ago.  He has constant low back pain radiating down the left leg to the little toe and down the right leg to the great toe, left worse. He had L5 disc surgery about 30 years ago. He received cortisone injections in both shoulders about a week before his initial 11/02/2023 evaluation and in both knees about a month prior.  Cervical spine x-rays show degenerative disc disease with mild multilevel spondyloarthropathy. Shoulder and pelvis x-rays show bilateral glenohumeral and acromioclavicular degenerative changes. Knee x-rays show bilateral osteoarthritis. Lumbar spine x-rays show multilevel thoracic and lumbar degenerative disease and a 4 cm infrarenal abdominal aortic aneurysm.  Cervical MRI (11/12/2023) shows multilevel foraminal stenosis, including prominent left and moderate right stenosis at C3-4, prominent right foraminal with borderline central stenosis at C4-5, moderate left foraminal stenosis at C2-3, and moderate right foraminal stenosis at C5-6, related to osteophytes, facet spurring, and mild disc bulges.  Lumbar MRI (11/12/2023) shows a 3.7 cm infrarenal abdominal aortic aneurysm, moderate central canal stenosis, moderate right L3 nerve root displacement at L3-4, and moderate left foraminal stenosis with left L4 nerve root displacement at L4-5 with evidence of prior right L4  laminectomy. It also shows moderate right subarticular lateral recess and borderline right foraminal stenosis at L2-3, with sacralization of L5 and hypoplastic T12 ribs.      Patient presented with interventional treatment options. Mr. Greenawalt was informed that I will not be providing medication management. Pharmacotherapy evaluation including recommendations may be offered, if specifically requested.   Controlled Substance Pharmacotherapy Assessment REMS (Risk Evaluation and Mitigation Strategy)  Opioid Analgesic:  tramadol  50 mg tablet, 1 tab p.o. 3 times daily (90/month) (last filled on 10/04/2023) MME/day: 30.0 mg/day   Pill Count: None  expected due to no prior prescriptions written by our practice. No notes on file  Pharmacokinetics: Liberation and absorption (onset of action): WNL Distribution (time to peak effect): WNL Metabolism and excretion (duration of action): WNL         Pharmacodynamics: Desired effects: Analgesia: Mr. Lunt reports >50% benefit. Functional ability: Patient reports that medication allows him to accomplish basic ADLs Clinically meaningful improvement in function (CMIF): Sustained CMIF goals met Perceived effectiveness: Described as relatively effective, allowing for increase in activities of daily living (ADL) Undesirable effects: Side-effects or Adverse reactions: None reported Monitoring: [place] PMP: PDMP reviewed during this encounter. Online review of the past 12-month period previously conducted. Not applicable at this point since we have not taken over the patient's medication management yet. List of other Serum/Urine Drug Screening Test(s):  Lab Results  Component Value Date   COCAINSCRNUR NONE DETECTED 01/30/2019   THCU NONE DETECTED 01/30/2019   ETH <10 01/30/2019   List of all UDS test(s) done:  Lab Results  Component Value Date   SUMMARY FINAL 11/02/2023   Last UDS on record: Summary  Date Value Ref Range Status  11/02/2023 FINAL  Final     Comment:    ==================================================================== Compliance Drug Analysis, Ur ==================================================================== Test                             Result       Flag       Units  Drug Present and Declared for Prescription Verification   7-aminoclonazepam              39           EXPECTED   ng/mg creat    7-aminoclonazepam is an expected metabolite of clonazepam . Source of    clonazepam  is a scheduled prescription medication.    Tramadol                        1443         EXPECTED   ng/mg creat   O-Desmethyltramadol            1670         EXPECTED   ng/mg creat   N-Desmethyltramadol            326          EXPECTED   ng/mg creat    Source of tramadol  is a prescription medication. O-desmethyltramadol    and N-desmethyltramadol are expected metabolites of tramadol .    Gabapentin                      PRESENT      EXPECTED   Ibuprofen                      PRESENT      EXPECTED   Lidocaine                       PRESENT      EXPECTED  Drug Present not Declared for Prescription Verification   Sertraline                      PRESENT      UNEXPECTED   Desmethylsertraline            PRESENT      UNEXPECTED    Desmethylsertraline is an expected metabolite of sertraline .  Dextromethorphan               PRESENT      UNEXPECTED   Dextrorphan/Levorphanol        PRESENT      UNEXPECTED    Dextrorphan is an expected metabolite of dextromethorphan, an over-    the-counter or prescription cough suppressant. Dextrorphan cannot be    distinguished from the scheduled prescription medication levorphanol    by the method used for analysis.  Drug Absent but Declared for Prescription Verification   Acetaminophen                   Not Detected UNEXPECTED    Acetaminophen , as indicated in the declared medication list, is not    always detected even when used as directed.    Salicylate                     Not Detected UNEXPECTED    Aspirin , as  indicated in the declared medication list, is not always    detected even when used as directed.    Promethazine                    Not Detected UNEXPECTED ==================================================================== Test                      Result    Flag   Units      Ref Range   Creatinine              111              mg/dL      >=79 ==================================================================== Declared Medications:  The flagging and interpretation on this report are based on the  following declared medications.  Unexpected results may arise from  inaccuracies in the declared medications.   **Note: The testing scope of this panel includes these medications:   Clonazepam  (Klonopin )  Gabapentin  (Neurontin )  Promethazine   Tramadol  (Ultram )   **Note: The testing scope of this panel does not include small to  moderate amounts of these reported medications:   Acetaminophen  (Tylenol )  Aspirin   Ibuprofen  Topical Lidocaine  (LMX)   **Note: The testing scope of this panel does not include the  following reported medications:   Albuterol  (Ventolin  HFA)  Atorvastatin  (Lipitor)  Calcium   Fluticasone  (Arnuity Ellipta)  Ipratropium (Atrovent)  Losartan (Cozaar)  Lubiprostone  (Amitiza )  Melatonin  Pantoprazole  (Protonix )  Vitamin D  ==================================================================== For clinical consultation, please call [phone]. ====================================================================    UDS interpretation: No unexpected findings.          Medication Assessment Form: Not applicable. No opioids. Treatment compliance: Not applicable Risk Assessment Profile: Aberrant behavior: See initial evaluations. None observed or detected today Comorbid factors increasing risk of overdose: See initial evaluation. No additional risks detected today Opioid risk tool (ORT):     11/02/2023   12:42 PM  Opioid Risk   Alcohol 0  Illegal Drugs  0  Rx Drugs 0  Alcohol 0  Illegal Drugs 0  Rx Drugs 0  Age between 16-45 years  0  History of Preadolescent Sexual Abuse 0  Psychological Disease 0  Depression 0  Opioid Risk Tool Scoring 0  Opioid Risk Interpretation Low Risk    ORT Scoring interpretation table:  Score <3 = Low Risk for SUD  Score between 4-7 = Moderate Risk for SUD  Score >8 = High Risk for Opioid Abuse   Risk of substance use disorder (SUD):  Low  Risk Mitigation Strategies:  Patient opioid safety counseling: No controlled substances prescribed. Patient-Prescriber Agreement (PPA): No agreement signed.  Controlled substance notification to other providers: None required. No opioid therapy.  Pharmacologic Plan: Non-opioid analgesic therapy offered. Interventional alternatives discussed.             Laboratory Chemistry Profile   Renal Lab Results  Component Value Date   BUN 26 (H) 10/17/2023   CREATININE 1.17 10/17/2023   GFRAA >60 01/30/2019   GFRNONAA >60 10/17/2023   SPECGRAV 1.020 03/09/2023   PHUR 6.0 03/09/2023   PROTEINUR NEGATIVE 10/17/2023     Electrolytes Lab Results  Component Value Date   NA 139 10/17/2023   K 3.6 10/17/2023   CL 105 10/17/2023   CALCIUM  9.2 10/17/2023   MG 1.7 11/21/2023   PHOS 3.2 08/16/2021     Hepatic Lab Results  Component Value Date   AST 65 (H) 10/17/2023   ALT 22 10/17/2023   ALBUMIN 4.5 10/17/2023   ALKPHOS 70 10/17/2023   LIPASE 36 08/03/2023     ID Lab Results  Component Value Date   SARSCOV2NAA NEGATIVE 10/05/2023     Bone Lab Results  Component Value Date   VD25OH 42.63 11/21/2023     Endocrine Lab Results  Component Value Date   GLUCOSE 109 (H) 10/17/2023   GLUCOSEU NEGATIVE 10/17/2023   HGBA1C 5.5 01/31/2019   TSH 1.053 08/16/2021     Neuropathy Lab Results  Component Value Date   VITAMINB12 503 11/21/2023   FOLATE 6.9 08/18/2021   HGBA1C 5.5 01/31/2019     CNS No results found for: COLORCSF, APPEARCSF,  RBCCOUNTCSF, WBCCSF, POLYSCSF, LYMPHSCSF, EOSCSF, PROTEINCSF, GLUCCSF, JCVIRUS, CSFOLI, IGGCSF, LABACHR, ACETBL   Inflammation (CRP: Acute  ESR: Chronic) Lab Results  Component Value Date   CRP 0.6 11/21/2023   ESRSEDRATE 11 11/21/2023   LATICACIDVEN 1.2 08/16/2021     Rheumatology No results found for: RF, ANA, LABURIC, URICUR, LYMEIGGIGMAB, LYMEABIGMQN, HLAB27   Coagulation Lab Results  Component Value Date   INR 1.1 08/16/2021   LABPROT 13.9 08/16/2021   APTT 29 08/16/2021   PLT 146 (L) 10/17/2023     Cardiovascular Lab Results  Component Value Date   BNP 63.9 08/17/2021   HGB 11.9 (L) 10/17/2023   HCT 36.0 (L) 10/17/2023     Screening Lab Results  Component Value Date   SARSCOV2NAA NEGATIVE 10/05/2023     Cancer No results found for: CEA, CA125, LABCA2   Allergens No results found for: ALMOND, APPLE, ASPARAGUS, AVOCADO, BANANA, BARLEY, BASIL, BAYLEAF, GREENBEAN, LIMABEAN, WHITEBEAN, BEEFIGE, REDBEET, BLUEBERRY, BROCCOLI, CABBAGE, MELON, CARROT, CASEIN, CASHEWNUT, CAULIFLOWER, CELERY     Note: Lab results reviewed.  Recent Diagnostic Imaging Review  Cervical Imaging: Cervical MR wo contrast: Results for orders placed during the hospital encounter of 11/08/23 MR CERVICAL SPINE WO CONTRAST  Narrative EXAM: MRI CERVICAL SPINE WITHOUT CONTRAST 11/08/2023 06:14:20 PM  TECHNIQUE: Multiplanar multisequence MRI of the cervical spine was performed without the administration of intravenous contrast.  COMPARISON: Radiographs 11/08/2023.  CLINICAL HISTORY: Cervical radiculopathy, chronic neck pain.  FINDINGS:  BONES AND ALIGNMENT: Mildly exaggerated cervical lordosis. 1.5 mm posterior subluxation at C3-C4. Normal vertebral body heights. Chronic mild superior endplate compression at T1. Borderline low T1 signal in the T2 vertebral body but without accentuated T2 signal,  probably incidental. Background marrow signal is unremarkable unless otherwise stated. No abnormal enhancement.  SPINAL CORD: Normal spinal cord size. Normal spinal cord signal.  SOFT TISSUES: No paraspinal  mass.  C2-C3: Moderate left foraminal stenosis due to facet arthropathy and mild uncinate spurring.  C3-C4: Prominent left and moderate right foraminal stenosis due to uncinate spurring and mild disc bulge.  C4-C5: Prominent right foraminal stenosis and borderline central stenosis due to uncinate facet spurring along with disc bulge.  C5-C6: No impingement. Degenerative left facet arthropathy.  C6-C7: No significant disc herniation. No spinal canal stenosis or neural foraminal narrowing.  C7-T1: No impingement. Bilateral degenerative facet arthropathy.  T1-T2: Moderate right foraminal stenosis due to facet spurring and uncinate spurring. Minimal disc bulge.  General Cervical Spine: Mild disc desiccation throughout the cervical spine.  IMPRESSION: 1. C3-4: prominent left and moderate right foraminal stenosis due to uncinate spurring and mild disc bulge. 2. C4-5: prominent right foraminal stenosis and borderline central stenosis due to uncinate facet spurring along with disc bulge. 3. C2-3: moderate left foraminal stenosis due to facet arthropathy and mild uncinate spurring. 4. T1-2: moderate right foraminal stenosis due to facet spurring and uncinate spurring, with minimal disc bulge.  Electronically signed by: Ryan Salvage MD 11/12/2023 02:12 PM EST RP Workstation: [id number]  Cervical DG Bending/F/E views: Results for orders placed during the hospital encounter of 11/08/23 DG Cervical Spine With Flex & Extend  Narrative CLINICAL DATA:  Cervicalgia, chronic neck pain  EXAM: DG CERVICAL SPINE COMP WITH FLEX  COMPARISON:  03/15/2021  FINDINGS: Normal alignment and prevertebral soft tissues. Similar multilevel cervical spine degenerative changes  spanning C3 through C6. Endplate sclerosis and bony spurring noted. Mild multilevel facet arthropathy posteriorly. Facets are aligned. Trachea midline. Lung apices are clear. Aortic and carotid calcifications noted.  IMPRESSION: Similar cervical spine degenerative changes as above. No acute finding by plain radiography.   Electronically Signed By: CHRISTELLA.  Shick M.D. On: 11/10/2023 09:48  Shoulder Imaging: Shoulder-L MR wo contrast: Results for orders placed during the hospital encounter of 03/19/15 MR Shoulder Left Wo Contrast  Narrative CLINICAL DATA:  Pain and weakness about the left shoulder for 8 months. No known injury. Initial encounter.  EXAM: MRI OF THE LEFT SHOULDER WITHOUT CONTRAST  TECHNIQUE: Multiplanar, multisequence MR imaging of the shoulder was performed. No intravenous contrast was administered.  COMPARISON:  None.  FINDINGS: Rotator cuff: There is rotator cuff tendinopathy. The patient has a partial width, full-thickness tear of the supraspinatus measuring approximately 1.3 cm from front to back with retraction of 1-2 cm. The rotator cuff is otherwise intact.  Muscles:  No atrophy or focal lesion.  Biceps long head: The tendon is completely torn from the superior labrum and retracted approximately 5 cm below the top of the humeral head.  Acromioclavicular Joint:  Bulky degenerative change is seen.  Glenohumeral Joint: Unremarkable.  Labrum:  Intact.  Bones: No fracture or worrisome marrow lesion. There is some subacromial spurring. Fluid is present in the subacromial/subdeltoid bursa.  IMPRESSION: Rotator cuff tendinopathy with a partial width, full-thickness tear of the anterior and far lateral supraspinatus measuring approximately 1.3 cm from front to back with 1-2 cm of retraction. No atrophy.  Complete tear of the long head of biceps from the superior labrum.  Bulky acromioclavicular osteoarthritis. Subacromial spur  also identified.  Subacromial/subdeltoid bursitis.   Electronically Signed By: Debby Prader M.D. On: 03/19/2015 16:40  Shoulder-R DG: Results for orders placed during the hospital encounter of 11/08/23 DG Shoulder Right  Narrative EXAM: 1 VIEW XRAY OF THE RIGHT SHOULDER 11/08/2023 04:40:34 PM  COMPARISON: 11/15/2021  CLINICAL HISTORY: Right shoulder pain. Neck pain over 3 months ; Chronic  pain of both shoulders; Chronic bilateral pain of lower extremities; Failed surgical syndrome; Lumbosacral radiculopathy  FINDINGS:  BONES AND JOINTS: Glenohumeral joint is normally aligned. No acute fracture or dislocation. The [facility] joint shows mild acromioclavicular degenerative changes.  SOFT TISSUES: Axillary artery calcifications are noted. Visualized lung is unremarkable.  IMPRESSION: 1. No acute findings. 2. Mild acromioclavicular osteoarthritis.  Electronically signed by: Elsie Perone MD 11/10/2023 09:38 AM EDT RP Workstation: [id number]  Shoulder-L DG: Results for orders placed during the hospital encounter of 11/08/23 DG Shoulder Left  Narrative CLINICAL DATA:  Left shoulder pain  EXAM: LEFT SHOULDER - 2+ VIEW  COMPARISON:  11/08/2023  FINDINGS: Bones are osteopenic. No acute osseous finding, fracture, or malalignment. Similar mild degenerate arthropathy of the left AC joint and glenohumeral joint. Included left chest unremarkable. Aorta atherosclerotic. Degenerative changes throughout the spine.  IMPRESSION: Osteopenia and degenerative changes. No acute finding by plain radiography.   Electronically Signed By: CHRISTELLA.  Shick M.D. On: 11/10/2023 09:41  Thoracic Imaging: Thoracic DG w/swimmers view: Results for orders placed during the hospital encounter of 07/28/21 [facility] Thoracic Spine W/Swimmers  Narrative CLINICAL DATA:  Back pain.  Mid back pain and weakness.  EXAM: THORACIC SPINE - 3 VIEWS  COMPARISON:  No prior thoracic spine exams. Cervical  spine radiograph 03/15/2021. Lower thoracic spine reformats from abdominal CT 03/23/2018  FINDINGS: Exaggerated upper thoracic kyphosis. Known T11 compression fracture is grossly stable. There additional mild compression fractures of tentatively number T4, T5, and T9. Multilevel anterior spurring with slight diffuse disc space narrowing. No definite paravertebral soft tissue abnormalities.  IMPRESSION: 1. Chronic compression deformities at T11, also T4 and T5, stable from prior imaging. 2. Mild T9 compression fracture with no comparisons to assess for acuity. 3. Diffuse degenerative disc disease.   Electronically Signed By: Andrea Gasman M.D. On: 07/28/2021 18:55  Lumbosacral Imaging: Lumbar MR wo contrast: Results for orders placed during the hospital encounter of 11/08/23 MR LUMBAR SPINE WO CONTRAST  Narrative EXAM: MRI LUMBAR SPINE 11/08/2023 06:12:44 PM  TECHNIQUE: Multiplanar multisequence MRI of the lumbar spine was performed without the administration of intravenous contrast.  COMPARISON: Lumbar spine radiographs 11/08/2023, lumbar CT from 09/24/2023, and rib radiographs from 09/15/2022.  CLINICAL HISTORY: Low back pain, lumbar radiculopathy, symptoms persist with greater than 6 weeks treatment.  FINDINGS:  BONES AND ALIGNMENT: Normal alignment. Normal vertebral body heights, except for L5 which is partially sacralized. 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 This represents a change in numbering compared to the prior lumbar spine CT from 09/24/2023. Careful correlation with this numbering strategy is recommended if procedural intervention is to be performed. Bone marrow signal: Fatty marrow at L5 and in the bony sacrum favoring prior regional radiation therapy. Acquired interbody fusion at L1-L2. Disc desiccation in the remainder of the lumbar intervertebral disc spaces with loss of disc height at L4-L5. 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  SPINAL CORD: Normal appearing  conus medullaris terminates at the T12 level.  SOFT TISSUES: No paraspinal mass. Fatty atrophy of the left iliacus and left psoas muscles.  T12-L1: Disc bulge on right foraminal line in the arterial. No impingement.  L1-L2: Acquired interbody fusion. No impingement.  L2-L3: Moderate right subarticular lateral recess stenosis and borderline right foraminal stenosis due to facet arthropathy, disc bulge, and mild intervertebral spurring.  L3-L4: Moderate central stenosis and moderate displacement of the right L3 nerve in the lateral extraforaminal space due to disc bulge, intervertebral spurring, and facet arthropathy.  L4-L5: Moderate left foraminal stenosis and  moderate displacement of the left L4 nerve and the lateral extraforaminal space due to intervertebral spurring and facet arthropathy. Prior right laminectomy at L4.  L5-S1: Rudimentary disc material below the somewhat sacralized L5 vertebral body. No impingement.  INCIDENTAL FINDINGS: Bilateral fluid signal intensity renal cysts warrant no further imaging workup. Infrarenal abdominal aortic aneurysm 3.7 cm anterior to posterior dimension less suspected internal mural thrombus.  IMPRESSION: 1. Moderate central stenosis and moderate displacement of the right L3 nerve in the lateral extraforaminal space at L3-4 due to disc bulge, intervertebral spurring, and facet arthropathy. 2. Moderate left foraminal stenosis and moderate displacement of the left L4 nerve in the lateral extraforaminal space at L4-5 due to intervertebral spurring and facet arthropathy. Prior right laminectomy at L4. 3. Moderate right subarticular lateral recess stenosis and borderline right foraminal stenosis at L2-3 due to facet arthropathy, disc bulge, and mild intervertebral spurring. 4. Please note that the L5 vertebra is sacralized and the T12 ribs are hypoplastic. Careful correlation with the numbering strategy is recommended if procedural  intervention is to be performed. 5. Infrarenal abdominal aortic aneurysm measuring 3.7 cm in anterior to posterior dimension with suspected internal mural thrombus. Recommendation is surveillance with a 2-year nterval.  Electronically signed by: Ryan Salvage MD 11/12/2023 02:05 PM EST RP Workstation: [id number]  Lumbar DG Bending views: Results for orders placed during the hospital encounter of 11/08/23 DG Lumbar Spine Complete W/Bend  Narrative CLINICAL DATA:  Low back pain  EXAM: LUMBAR SPINE - COMPLETE WITH BENDING VIEWS  COMPARISON:  07/28/2021  FINDINGS: Similar slight levocurvature on the frontal view. Preserved vertebral body heights. No acute compression fracture. Slight worsening of multilevel lower thoracic and lumbar degenerative changes diffusely. Facets are aligned. No instability with flexion extension. Aorta atherosclerotic. Similar 4 cm AAA by plain radiography.  IMPRESSION: 1. Slight worsening of multilevel lower thoracic and lumbar degenerative changes. 2. No acute finding by plain radiography. 3. Similar 4 cm AAA.   Electronically Signed By: CHRISTELLA.  Shick M.D. On: 11/10/2023 09:46  Hip Imaging: Hip-L DG 2-3 views: Results for orders placed in visit on 12/19/22 DG HIP UNILAT WITH PELVIS 2-3 VIEWS LEFT  Narrative Multiple views left hip  Status post femoral nailing for what was either a hip fracture or femur fracture in any event there is a cephalic medullary nail making me think this was for inotrope fracture there is also an abnormality near the area just proximal to the midshaft which may have been the fracture site as well.  In any event no arthritis in the hip joint  Impression healed fracture left femur/hip no problems related to the nail.  We do note femoral artery calcifications  Knee Imaging: Knee-R DG 4 views: Results for orders placed during the hospital encounter of 11/08/23 DG Knee Complete 4 Views Right  Narrative CLINICAL DATA:   Right knee pain/arthralgia  EXAM: DG KNEE COMPLETE 4+V*R*  COMPARISON:  06/21/2016  FINDINGS: Bones are osteopenic. Moderately severe right knee degenerative arthropathy with chondrocalcinosis in the medial and lateral compartments. Degenerative changes have progressed compared to 2018. Joint space loss and bony spurring noted. No acute osseous finding, fracture, malalignment or large effusion. Patella is located. Progressive peripheral atherosclerotic changes.  IMPRESSION: 1. Osteopenia and worsening degenerative changes as above. 2. No acute finding by plain radiography.   Electronically Signed By: CHRISTELLA.  Shick M.D. On: 11/10/2023 09:43  Knee-L DG 4 views: Results for orders placed during the hospital encounter of 11/08/23 DG Knee Complete 4 Views Left  Narrative CLINICAL DATA:  Left knee pain/arthralgia  EXAM: LEFT KNEE - COMPLETE 4+ VIEW  COMPARISON:  11/08/2023  FINDINGS: Moderately severe left knee tricompartmental degenerative arthropathy with chondrocalcinosis. No acute osseous finding, fracture, malalignment or large effusion. Soft tissues unremarkable. Remote left femur intramedullary rod, incompletely imaged. Calcified peripheral atherosclerosis noted.  IMPRESSION: 1. Moderately severe left knee tricompartmental degenerative arthropathy. 2. No acute finding by plain radiography.   Electronically Signed By: CHRISTELLA.  Shick M.D. On: 11/10/2023 09:44  Foot Imaging: Foot-L DG Complete: Results for orders placed in visit on 12/19/22 DG Foot Complete Left  Narrative Complains of left foot pain  X-rays show squaring off of the first metatarsal consistent with osteoarthritis no major osteophytes are noted.  The small toe also is visualized as well where the pain was there is no obvious problems in that area  Impression osteoarthritis great toe left foot  Wrist Imaging: Wrist-L DG Complete: Results for orders placed in visit on 10/29/00 DG Wrist Complete  Left  Narrative FINDINGS CLINICAL DATA:  CAT BITE, HAND SWELLING. LEFT HAND COMPLETE 10/29/00 THERE IS GAS SEEN WITHIN THE SOFT TISSUES OF THE ANTERIOR THUMB.  ADJACENT TO THIS SOFT TISSUE GAS, THERE IS A SLIGHTLY RADIOPAQUE DENSITY SEEN WHICH COULD REPRESENT A MINIMALLY RADIOPAQUE FOREIGN BODY.  UNDERLYING BONES ARE UNREMARKABLE.  NO FRACTURE, SUBLUXATION, DISLOCATION OR EVIDENCE OF OSTEOMYELITIS. IMPRESSION SOFT TISSUE GAS IN THE LEFT THUMB.  THERE MAY BE MINIMALLY RADIOPAQUE FOREIGN BODY IN THE SOFT TISSUES AS WELL. LEFT WRIST TWO VIEWS 10/29/00 NO ACUTE BONE ABNORMALITY IS SEEN.  SPECIFICALLY, THERE IS NO EVIDENCE OF FRACTURE, SUBLUXATION OR DISLOCATION.  NO EVIDENCE OF OSTEOMYELITIS. IMPRESSION NO ACUTE BONY ABNORMALITY.  Hand Imaging: Hand-L DG Complete: Results for orders placed in visit on 10/29/00 DG Hand Complete Left  Narrative FINDINGS CLINICAL DATA:  CAT BITE, HAND SWELLING. LEFT HAND COMPLETE 10/29/00 THERE IS GAS SEEN WITHIN THE SOFT TISSUES OF THE ANTERIOR THUMB.  ADJACENT TO THIS SOFT TISSUE GAS, THERE IS A SLIGHTLY RADIOPAQUE DENSITY SEEN WHICH COULD REPRESENT A MINIMALLY RADIOPAQUE FOREIGN BODY.  UNDERLYING BONES ARE UNREMARKABLE.  NO FRACTURE, SUBLUXATION, DISLOCATION OR EVIDENCE OF OSTEOMYELITIS. IMPRESSION SOFT TISSUE GAS IN THE LEFT THUMB.  THERE MAY BE MINIMALLY RADIOPAQUE FOREIGN BODY IN THE SOFT TISSUES AS WELL. LEFT WRIST TWO VIEWS 10/29/00 NO ACUTE BONE ABNORMALITY IS SEEN.  SPECIFICALLY, THERE IS NO EVIDENCE OF FRACTURE, SUBLUXATION OR DISLOCATION.  NO EVIDENCE OF OSTEOMYELITIS. IMPRESSION NO ACUTE BONY ABNORMALITY.  Complexity Note: Imaging results reviewed.                         Meds  Current Medications[1]  ROS  Constitutional: Denies any fever or chills Gastrointestinal: No reported hemesis, hematochezia, vomiting, or acute GI distress Musculoskeletal: Denies any acute onset joint swelling, redness, loss of ROM, or  weakness Neurological: No reported episodes of acute onset apraxia, aphasia, dysarthria, agnosia, amnesia, paralysis, loss of coordination, or loss of consciousness  Allergies  Mr. Hammonds is allergic to codeine.  PFSH  Drug: Mr. Chiappetta  reports no history of drug use. Alcohol:  reports that he does not currently use alcohol. Tobacco:  reports that he has been smoking cigarettes. He has a 82.5 pack-year smoking history. He has been exposed to tobacco smoke. He has never used smokeless tobacco. Medical:  has a past medical history of Anxiety and depression, Arteriosclerotic cardiovascular disease (ASCVD), Blood clotting disorder, Carpal tunnel syndrome, Chronic anticoagulation, Chronic upper extremity pain (11/02/2023), Deep vein thrombosis (  HCC), Degenerative joint disease, Gastroesophageal reflux disease, Hyperlipidemia, Hypertension, Myocardial infarction [facility]), Prostate cancer (HCC), Stroke (HCC), and Tobacco abuse. Surgical: Mr. Stetzer  has a past surgical history that includes Inguinal hernia repair; Rotator cuff repair (Bilateral); ORIF-unknown fracture; Colonoscopy (?  Date); Prostate biopsy; Colonoscopy with propofol  (N/A, 07/14/2021); polypectomy (07/14/2021); Cardiac catheterization (2004); IM nailing humerus (Left); Back surgery (1988); Cataract extraction w/PHACO (Right, 04/04/2022); Colonoscopy (N/A, 12/27/2023); Esophagogastroduodenoscopy (N/A, 12/27/2023); Esophageal dilation (N/A, 12/27/2023); and Esophageal brushing (12/27/2023). Family: family history includes Breast cancer in his maternal aunt; Coronary artery disease in his father and mother; Heart disease in his mother; Lung cancer in his cousin; Melanoma in his daughter.  Constitutional Exam  General appearance: Well nourished, well developed, and well hydrated. In no apparent acute distress Vitals:   01/01/24 1342  BP: (!) 165/86  Pulse: 62  Resp: 18  Temp: (!) 97.3 F (36.3 C)  TempSrc: Temporal  SpO2: 100%  Weight: 176  lb (79.8 kg)  Height: 6' (1.829 m)   BMI Assessment: Estimated body mass index is 23.87 kg/m as calculated from the following:   Height as of this encounter: 6' (1.829 m).   Weight as of this encounter: 176 lb (79.8 kg).  BMI interpretation table: BMI level Category Range association with higher incidence of chronic pain  <18 kg/m2 Underweight   18.5-24.9 kg/m2 Ideal body weight   25-29.9 kg/m2 Overweight Increased incidence by 20%  30-34.9 kg/m2 Obese (Class I) Increased incidence by 68%  35-39.9 kg/m2 Severe obesity (Class II) Increased incidence by 136%  >40 kg/m2 Extreme obesity (Class III) Increased incidence by 254%   Patient's current BMI Ideal Body weight  Body mass index is 23.87 kg/m. Ideal body weight: 77.6 kg (171 lb 1.2 oz) Adjusted ideal body weight: 78.5 kg (173 lb 0.7 oz)   BMI Readings from Last 4 Encounters:  01/01/24 23.87 kg/m  12/27/23 23.92 kg/m  12/21/23 23.98 kg/m  12/12/23 23.98 kg/m   Wt Readings from Last 4 Encounters:  01/01/24 176 lb (79.8 kg)  12/27/23 176 lb 5.9 oz (80 kg)  12/21/23 176 lb 12.9 oz (80.2 kg)  12/12/23 176 lb 12.8 oz (80.2 kg)    Psych/Mental status: Alert, oriented x 3 (person, place, & time)       Eyes: PERLA Respiratory: No evidence of acute respiratory distress  Assessment & Plan  Primary Diagnosis & Pertinent Problem List: The primary encounter diagnosis was Chronic neck and back pain (1ry area of Pain) (Bilateral). 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 Visit Diagnosis: 1. Chronic neck and back pain (1ry area of Pain) (Bilateral)   2. Chronic shoulder pain (2ry area of Pain) (Bilateral)   3. Chronic lower extremity pain (3ry area of Pain) (Bilateral) (L>R)   4. Cervicalgia   5. Cervicogenic headache (Bilateral)   6. Cervical Posterior Subluxation of C3-C4 cervical vertebrae (1.5 mm), sequela   7. 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 Cervical facet arthropathy (Multilevel) (Bilateral)   9. Cervical facet joint pain (Bilateral)   10. Chronic shoulder radicular pain (Bilateral)   11. Abnormal  MRI, cervical spine (11/12/2023)   12. Current use of aspirin  (ASA>81mg )    Problems updated and reviewed during this visit: No problems updated.   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 MRI reveals degenerative disc disease with foraminal and central canal stenosis in the cervical spine. Symptoms are likely due to cervical spondylosis with radiculopathy and cervicogenic headache. Schedule a cervical epidural steroid injection in the midline to address bilateral symptoms. Obtain preapproval from insurance for the procedure. Stop blood thinners and Advil prior to the procedure to prevent bleeding complications. Continue using lidocaine  cream for pain relief.  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 MRI shows degenerative disc disease with foraminal and central canal stenosis in the lumbar spine. Symptoms are likely due to lumbar spondylosis with radiculopathy and spinal stenosis.  Bilateral  glenohumeral osteoarthritis with chronic pain   Chronic bilateral shoulder pain is worse in the left shoulder than the right. The left arm experiences constant pain from the biceps to the elbow, while the right arm has intermittent pain with nocturnal numbness in all fingers. X-rays show glenohumeral and acromioclavicular degenerative changes bilaterally. Symptoms are likely due to bilateral glenohumeral osteoarthritis with chronic pain.      Pharmacotherapy (Medications Ordered): No orders of the defined types were placed in this encounter.  Procedure Orders         Cervical Epidural Injection     Orders Placed This Encounter  Procedures   Cervical Epidural Injection    Sedation: Patient's choice. Purpose: Diagnostic/Therapeutic Indication(s): Radiculitis and cervicalgia associater with cervical degenerative disc disease.    Standing Status:   Future    Expiration Date:   03/31/2024    Scheduling Instructions:     Procedure: Cervical Epidural Steroid Injection/Block     Level(s): C7-T1     Laterality: Midline     Timeframe: As soon as schedule allows.    Where will this procedure be performed?:   [facility]             by Dr. Tanya   Blood Thinner Instructions to Nursing    Always make sure patient has clearance from prescribing physician to stop blood thinners for interventional therapies. If the patient requires a Lovenox -bridge therapy, make sure arrangements are made to institute it with the assistance of the PCP.    Scheduling Instructions:     Have Mr. Trageser stop the ASA (>81mg ) x 11 days prior to procedure or surgery.   Lab Orders  No laboratory test(s) ordered today   Imaging Orders  No imaging studies ordered today   Referral Orders  No referral(s) requested today    Pharmacological management:  Opioid Analgesics: I will not be prescribing any opioids at this time Membrane stabilizer: I will not be prescribing any at this time Muscle relaxant: I will  not be prescribing any at this time NSAID: I will not be prescribing any at this time Other analgesic(s): I will not be prescribing any at this time      Interventional Therapies  Risk Factors  Considerations  Medical Comorbidities:  AAA  CAD  Hx. DVT  GERD  HTN  Bradycardia  Hx. Stroke  Hx. DVT  Tobacco Abuse  Constipation  advanced age  ASA 325 mg     Planned  Pending:   Diagnostic/therapeutic midline cervical ESI #1    Under consideration:   Diagnostic/therapeutic midline cervical ESI #1  Diagnostic/therapeutic bilateral trapezius muscle TPI/MNB #1  Diagnostic/therapeutic midline caudal ESI #1 + diagnostic epidurogram #1    Completed: (Analgesic benefit)1  None at this time   Therapeutic  Palliative (PRN) options:   Referral to physical therapy to work with the patient's neck pain and upper back pain (11/02/2023)    Completed by other providers:   None reported  1(Analgesic benefit): Expressed in percentage (%). (Local anesthetic[LA] +/- sedation  L.A.Local Anesthetic  Steroid benefit  Ongoing benefit)     Provider-requested follow-up: Return for [facility]): (ML) CESI #1, (Blood Thinner Protocol). Recent Visits Date Type Provider Dept  01/01/24 Office Visit Tanya Glisson, MD [facility]  Showing recent visits within past 90 days and meeting all other requirements Today's Visits Date Type Provider Dept  02/01/24 Appointment Tanya Glisson, MD [facility]  Showing today's visits and meeting all other requirements Future Appointments No visits were found meeting these conditions. Showing future appointments within next 90 days and meeting all other requirements   Primary Care Physician: Sheryle Carwin, MD  Duration of encounter: 30 minutes.  Total time on encounter, as per AMA guidelines included both the face-to-face and non-face-to-face time personally spent by the physician and/or other qualified health care professional(s) on  the day of the encounter (includes time in activities that require the physician or other qualified health care professional and does not include time in activities normally performed by clinical staff).    Note by: Glisson DELENA Tanya, MD (TTS technology used. I apologize for any typographical errors that were not detected and corrected.) Date: 01/01/2024; Time: 7:22 AM     [1]  Current Outpatient Medications:    acetaminophen  (TYLENOL ) 500 MG tablet, Take 500 mg by mouth every 6 (six) hours as needed., Disp: , Rfl:    albuterol  (VENTOLIN  HFA) 108 (90 Base) MCG/ACT inhaler, Inhale 2 puffs into the lungs every 4 (four) hours as needed for wheezing or shortness of breath., Disp: , Rfl:    ARNUITY ELLIPTA 100 MCG/ACT AEPB, Inhale 1 puff into the lungs daily., Disp: , Rfl:    aspirin  EC 325 MG tablet, Take 325 mg by mouth daily., Disp: , Rfl:    atorvastatin  (LIPITOR) 40 MG tablet, Take 1 tablet (40 mg total) by mouth daily at 6 PM., Disp: 30 tablet, Rfl: 3   Calcium  Carbonate-Vit D-Min (CALCIUM  1200 PO), Take by mouth daily., Disp: , Rfl:    clonazePAM  (KLONOPIN ) 0.5 MG tablet, Take 0.5 mg by mouth 3 (three) times daily as needed for anxiety., Disp: , Rfl:    fluconazole  (DIFLUCAN ) 200 MG tablet, Take 2 tablets on day 1 then 1 tablet daily thereafter for a total of 14 days., Disp: 15 tablet, Rfl: 0   fluticasone  (FLOVENT  HFA) 110 MCG/ACT inhaler, Inhale 2 puffs into the lungs 2 (two) times daily. Rinse mouth with water  after each use, Disp: 1 each, Rfl: 0   gabapentin  (NEURONTIN ) 100 MG capsule, Take 1 capsule (100 mg total) by mouth at bedtime., Disp: 60 capsule, Rfl: 5   hydrocortisone  (ANUSOL -HC) 2.5 % rectal cream, Place 1 Application rectally 2 (two) times daily., Disp: 30 g, Rfl: 1   Ibuprofen (ADVIL) 200 MG CAPS, Take 1 capsule by mouth daily as  needed., Disp: , Rfl:    ipratropium (ATROVENT) 0.06 % nasal spray, Place into both nostrils., Disp: , Rfl:    lidocaine  (LMX) 4 % cream, Apply 1  Application topically as needed., Disp: , Rfl:    losartan (COZAAR) 50 MG tablet, Take 50 mg by mouth daily., Disp: , Rfl:    melatonin 5 MG TABS, Take 20 mg by mouth at bedtime., Disp: , Rfl:    pantoprazole  (PROTONIX ) 40 MG tablet, TAKE 1 TABLET(40 MG) BY MOUTH DAILY, Disp: 30 tablet, Rfl: 11   promethazine -dextromethorphan (PROMETHAZINE -DM) 6.25-15 MG/5ML syrup, Take 5 mLs by mouth 4 (four) times daily as needed., Disp: 100 mL, Rfl: 0   traMADol  (ULTRAM ) 50 MG tablet, Take 50 mg by mouth 3 (three) times daily as needed., Disp: , Rfl:   Current Facility-Administered Medications:    methylPREDNISolone  acetate (DEPO-MEDROL ) injection 40 mg, 40 mg, Intra-articular, Once, Margrette Taft BRAVO, MD   methylPREDNISolone  acetate (DEPO-MEDROL ) injection 40 mg, 40 mg, Intra-articular, Once, Margrette Taft BRAVO, MD

## 2024-01-08 ENCOUNTER — Ambulatory Visit: Admitting: Orthopedic Surgery

## 2024-01-15 NOTE — Therapy (Deleted)
 " OUTPATIENT PHYSICAL THERAPY SPINAL EVALUATION   Patient Name: Alex Lara. MRN: [id number] DOB:07/21/1940, 84 y.o., male Today's Date: 01/15/2024  END OF SESSION:   Past Medical History:  Diagnosis Date   Anxiety and depression    Arteriosclerotic cardiovascular disease (ASCVD)    06/2008: DES to the RCA and LAD   Blood clotting disorder    Carpal tunnel syndrome    Chronic anticoagulation    Chronic upper extremity pain 11/02/2023   Deep vein thrombosis (HCC)    Degenerative joint disease    Gastroesophageal reflux disease    Hyperlipidemia    Hypertension    Myocardial infarction (HCC)    1970   Prostate cancer (HCC)    Stroke (HCC)    Tobacco abuse    Past Surgical History:  Procedure Laterality Date   BACK SURGERY  1988   CARDIAC CATHETERIZATION  2004   stents   CATARACT EXTRACTION W/PHACO Right 04/04/2022   Procedure: CATARACT EXTRACTION PHACO AND INTRAOCULAR LENS PLACEMENT (IOC);  Surgeon: Harrie Agent, MD;  Location: [facility];  Service: Ophthalmology;  Laterality: Right;  CDE: 10.63   COLONOSCOPY  ?  Date   COLONOSCOPY N/A 12/27/2023   Procedure: COLONOSCOPY;  Surgeon: Cindie Carlin POUR, DO;  Location: [facility];  Service: Endoscopy;  Laterality: N/A;  945AM, ASA 3   COLONOSCOPY WITH PROPOFOL  N/A 07/14/2021   Procedure: COLONOSCOPY WITH PROPOFOL ;  Surgeon: Cindie Carlin POUR, DO;  Location: [facility];  Service: Endoscopy;  Laterality: N/A;  8:15am, asa 3   ESOPHAGEAL BRUSHING  12/27/2023   Procedure: ESOPHAGOSCOPY, WITH BRUSH BIOPSY;  Surgeon: Cindie Carlin POUR, DO;  Location: [facility];  Service: Endoscopy;;   ESOPHAGEAL DILATION N/A 12/27/2023   Procedure: DILATION, ESOPHAGUS;  Surgeon: Cindie Carlin POUR, DO;  Location: [facility];  Service: Endoscopy;  Laterality: N/A;   ESOPHAGOGASTRODUODENOSCOPY N/A 12/27/2023   Procedure: EGD (ESOPHAGOGASTRODUODENOSCOPY);  Surgeon: Cindie Carlin POUR, DO;  Location: [facility];  Service: Endoscopy;   Laterality: N/A;   IM NAILING HUMERUS Left    INGUINAL HERNIA REPAIR     wife- states umbilical hernia repair, not inguinal.   ORIF-unknown fracture     POLYPECTOMY  07/14/2021   Procedure: POLYPECTOMY;  Surgeon: Cindie Carlin POUR, DO;  Location: [facility];  Service: Endoscopy;;   PROSTATE BIOPSY     ROTATOR CUFF REPAIR Bilateral    Patient Active Problem List   Diagnosis Date Noted   Current use of aspirin  (ASA>81mg ) 01/01/2024   Proctocolitis 12/12/2023   Left sided abdominal pain 12/12/2023   DDD (degenerative disc disease), cervical 12/11/2023   Cervical facet arthropathy (Multilevel) (Bilateral) 12/11/2023   Cervical foraminal stenosis (Bilateral: C3-4) (Right: C4-5,T1-2) (Left:C2-3) 12/11/2023   Cervical Posterior Subluxation of C3-C4 cervical vertebrae (1.5 mm), sequela 12/11/2023   Tendinopathy of rotator cuff (Left) 12/11/2023   Supraspinatus tendon tear (Left) 12/11/2023   Osteoarthritis of AC (acromioclavicular) joint (Left) 12/11/2023   Arthralgia of acromioclavicular joint (Left) 12/11/2023   Acromioclavicular joint pain (Left) 12/11/2023   Subacromial bursitis of shoulder joint (Left) 12/11/2023   Subdeltoid bursitis (Left) 12/11/2023   Osteoarthritis involving multiple joints 12/11/2023   Complete tear of long head of biceps tendon, sequela (Left) 12/11/2023   Osteoarthritis of AC (acromioclavicular) joint (Right) 12/11/2023   Osteopenia determined by x-ray 12/11/2023   Osteopenia of shoulder (Left) 12/11/2023   Osteoarthritis of shoulders (Bilateral) 12/11/2023   Osteoarthritis of glenohumeral joints (Bilateral) 12/11/2023   Osteoarthritis of acromioclavicular joints (  Bilateral) 12/11/2023   DDD (degenerative disc disease), thoracic 12/11/2023   Wedge compression fracture of T4 thoracic vertebra, sequela 12/11/2023   Wedge compression fracture of T5 thoracic vertebra, sequela 12/11/2023   Wedge compression fracture of T9 thoracic vertebra, sequela 12/11/2023    Wedge compression fracture of T11 thoracic vertebra, sequela 12/11/2023   Thoracic compression fracture (T4, T5, T9, T11), sequela 12/11/2023   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   DDD (degenerative disc disease), lumbar 12/11/2023   Focal fatty atrophy of iliacus and psoas muscle (Left) 12/11/2023   Sacrum and L5 Fatty infiltration of bone marrow (XRT) 12/11/2023   (AAA) Infrarenal Abdominal Aortic Aneurysm, 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.7 cm) 12/11/2023   Lumbar facet arthropathy (Multilevel) (Bilateral) 12/11/2023   Lumbar facet joint pain 12/11/2023   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) 12/11/2023   Lumbar lateral recess stenosis (Left: L4-5) (Right: L2-3) 12/11/2023   Lumbar foraminal stenosis (Right: L2-3) (Left: L4-5) 12/11/2023   History of L4 lumbar laminectomy (Right) 12/11/2023   Osteoarthritis of knees (Bilateral) 12/11/2023   Tricompartment osteoarthritis of knee (Left) 12/11/2023   Renal cysts (Bilateral) 12/11/2023   Abnormal MRI, cervical spine (11/12/2023) 12/10/2023   Abnormal MRI, lumbar spine (11/12/2023) 12/10/2023   Chronic pain syndrome 11/02/2023   Pharmacologic therapy 11/02/2023   Disorder of skeletal system 11/02/2023   Problems influencing health status 11/02/2023   Chronic neck and back pain (1ry area of Pain) (Bilateral) 11/02/2023   Cervicalgia 11/02/2023   Neck pain of over 3 months duration 11/02/2023   Neck pain (Bilateral) 11/02/2023   Chronic neck pain, posterior 11/02/2023   Neck and shoulder pain (Bilateral) 11/02/2023   Chronic shoulder pain (2ry area of Pain) (Bilateral) 11/02/2023   Chronic shoulder radicular pain (Bilateral) 11/02/2023   Chronic lower extremity pain (3ry area of Pain) (Bilateral) (L>R) 11/02/2023   Chronic radicular pain of lower extremity (Bilateral) 11/02/2023   Lumbosacral radiculopathy at L5 (Right) 11/02/2023   Lumbosacral radiculopathy at S1 (Left) 11/02/2023   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   History of back  surgery 11/02/2023   Cervicogenic headache (Bilateral) 11/02/2023   Occipital headache (GON) (Bilateral) 11/02/2023   Cervical facet joint pain (Bilateral) 11/02/2023   Chronic knee pain (Bilateral) 11/02/2023   Chronic upper extremity pain (Bilateral) 11/02/2023   Musculoskeletal disorder involving upper trapezius muscle (Bilateral) 11/02/2023   Change in bowel habits 11/01/2022   Dysphagia 11/01/2022   Prolapsed internal hemorrhoids, grade 3 05/26/2022   Grade II hemorrhoids 10/21/2021   Hemorrhoids 09/30/2021   Bradycardia 08/16/2021   Hypotension 08/16/2021   Aortic aneurysm 08/16/2021   AKI (acute kidney injury) 08/16/2021   Rectal pain 08/10/2021   Erectile dysfunction due to arterial insufficiency 09/13/2019   Weak urinary stream 09/13/2019   Benign prostatic hyperplasia with urinary obstruction    Depression with anxiety    Stroke-like symptoms 01/30/2019   Stroke (HCC) 01/30/2019   Malignant neoplasm of prostate (HCC) 04/22/2018   Chest tightness 05/04/2016   Dyspnea on exertion 05/04/2016   Coronary artery disease involving native coronary artery of native heart without angina pectoris    Hyperlipidemia    Tobacco abuse    Deep vein thrombosis (HCC)    Gastroesophageal reflux disease    Hypertension     PCP: Sheryle Carwin, MD  REFERRING PROVIDER: Tanya Glisson, MD  REFERRING DIAG: M54.2,M54.9,G89.29 (ICD-10-CM) - Chronic neck and back pain M54.2 (ICD-10-CM) - Cervicalgia M54.2 (ICD-10-CM) - Neck pain of over 3 months duration M54.2 (ICD-10-CM) - Neck pain, bilateral M54.2 (ICD-10-CM) - Posterior  neck pain M54.2,M25.519 (ICD-10-CM) - Neck and shoulder pain  Rationale for Evaluation and Treatment: Rehabilitation  THERAPY DIAG:  No diagnosis found.  ONSET DATE: ***  SUBJECTIVE:                                                                                                                                                                                            SUBJECTIVE STATEMENT: ***  PERTINENT HISTORY:  ***  PAIN:  Are you having pain? {OPRCPAIN:27236}  PRECAUTIONS: None  RED FLAGS: {PT Red Flags:29287}   WEIGHT BEARING RESTRICTIONS: No  FALLS:  Has patient fallen in last 6 months? {fallsyesno:27318}  LIVING ENVIRONMENT: Lives with: {OPRC lives with:25569::lives with their family} Lives in: {Lives in:25570} Stairs: {opstairs:27293} Has following equipment at home: {Assistive devices:23999}  OCCUPATION: ***  PLOF: {PLOF:24004}  PATIENT GOALS: ***  NEXT MD VISIT: ***  OBJECTIVE:  Note: Objective measures were completed at Evaluation unless otherwise noted.  DIAGNOSTIC FINDINGS:  IMPRESSION: 1. C3-4: prominent left and moderate right foraminal stenosis due to uncinate spurring and mild disc bulge. 2. C4-5: prominent right foraminal stenosis and borderline central stenosis due to uncinate facet spurring along with disc bulge. 3. C2-3: moderate left foraminal stenosis due to facet arthropathy and mild uncinate spurring. 4. T1-2: moderate right foraminal stenosis due to facet spurring and uncinate spurring, with minimal disc bulge.   IMPRESSION: 1. Moderate central stenosis and moderate displacement of the right L3 nerve in the lateral extraforaminal space at L3-4 due to disc bulge, intervertebral spurring, and facet arthropathy. 2. Moderate left foraminal stenosis and moderate displacement of the left L4 nerve in the lateral extraforaminal space at L4-5 due to intervertebral spurring and facet arthropathy. Prior right laminectomy at L4. 3. Moderate right subarticular lateral recess stenosis and borderline right foraminal stenosis at L2-3 due to facet arthropathy, disc bulge, and mild intervertebral spurring. 4. Please note that the L5 vertebra is sacralized and the T12 ribs are hypoplastic. Careful correlation with the numbering strategy is recommended if procedural intervention is to be performed. 5.  Infrarenal abdominal aortic aneurysm measuring 3.7 cm in anterior to posterior dimension with suspected internal mural thrombus.  PATIENT SURVEYS:  NDI:  NECK DISABILITY INDEX  Date: *** Score  Pain intensity {NDI-1:32931}  2. Personal care (washing, dressing, etc.) {NDI-2:32932}  3. Lifting {NDI-3:32933}  4. Reading {NDI-4:32934}  5. Headaches {NDI-5:32935}  6. Concentration {NDI-6:32936}  7. Work {NDI-7:32937}  8. Driving {WIP-1:67061}  9. Sleeping {NDI-9:32939}  10. Recreation {NDI-10:32940}  Total ***/50   Minimum Detectable Change (90% confidence): 5 points or 10% points  COGNITION: Overall cognitive status: {cognition:24006}  SENSATION: {sensation:27233}  MUSCLE LENGTH: Hamstrings: Right *** deg; Left *** deg Debby test: Right *** deg; Left *** deg  POSTURE: {posture:25561}  PALPATION: ***  CERVICAL ROM:   {AROM/PROM:27142} ROM A/PROM (deg) eval  Flexion   Extension   Right lateral flexion   Left lateral flexion   Right rotation   Left rotation    (Blank rows = not tested)  UPPER EXTREMITY MMT:  MMT Right eval Left eval  Shoulder flexion    Shoulder extension    Shoulder abduction    Shoulder adduction    Shoulder extension    Shoulder internal rotation    Shoulder external rotation    Middle trapezius    Lower trapezius    Elbow flexion    Elbow extension    Wrist flexion    Wrist extension    Wrist ulnar deviation    Wrist radial deviation    Wrist pronation    Wrist supination    Grip strength     (Blank rows = not tested)  LUMBAR ROM:   AROM eval  Flexion   Extension   Right lateral flexion   Left lateral flexion   Right rotation   Left rotation    (Blank rows = not tested)   LOWER EXTREMITY MMT:    MMT Right eval Left eval  Hip flexion    Hip extension    Hip abduction    Hip adduction    Hip internal rotation    Hip external rotation    Knee flexion    Knee extension    Ankle dorsiflexion    Ankle  plantarflexion    Ankle inversion    Ankle eversion     (Blank rows = not tested)  CERVICAL SPECIAL TESTS:  {Cervical special tests:25246}  LUMBAR SPECIAL TESTS:  {lumbar special test:25242}  FUNCTIONAL TESTS:  {Functional tests:24029}  GAIT: Distance walked: *** Assistive device utilized: {Assistive devices:23999} Level of assistance: {Levels of assistance:24026} Comments: ***  TREATMENT DATE:  01/18/24: PT Eval and HEP                                                                                                                                 PATIENT EDUCATION:  Education details: PT evaluation, objective findings, POC, Importance of HEP, Precautions, Clinic policies  Person educated: Patient Education method: Medical Illustrator Education comprehension: verbalized understanding and returned demonstration  HOME EXERCISE PROGRAM: ***  ASSESSMENT:  CLINICAL IMPRESSION: Patient is a 84 y.o. male who was seen today for physical therapy evaluation and treatment for M54.2,M54.9,G89.29 (ICD-10-CM) - Chronic neck and back pain M54.2 (ICD-10-CM) - Cervicalgia M54.2 (ICD-10-CM) - Neck pain of over 3 months duration M54.2 (ICD-10-CM) - Neck pain, bilateral M54.2 (ICD-10-CM) - Posterior neck pain M54.2,M25.519 (ICD-10-CM) - Neck and shoulder pain.    OBJECTIVE IMPAIRMENTS: {opptimpairments:25111}.   ACTIVITY LIMITATIONS: {activitylimitations:27494}  PARTICIPATION LIMITATIONS: {participationrestrictions:25113}  PERSONAL FACTORS: {Personal factors:25162} are also affecting patient's  functional outcome.   REHAB POTENTIAL: {rehabpotential:25112}  CLINICAL DECISION MAKING: {clinical decision making:25114}  EVALUATION COMPLEXITY: {Evaluation complexity:25115}   GOALS: Goals reviewed with patient? Yes  SHORT TERM GOALS: Target date: 02/09/24 Patient will be independent with performance of HEP to demonstrate adequate self management of symptoms.  Baseline:  Goal  status: INITIAL  2.   Patient will report at least a 25% improvement with function and/or pain reduction overall since beginning PT. Baseline:  Goal status: INITIAL   LONG TERM GOALS: Target date: 03/01/24  *** Baseline:  Goal status: INITIAL  2.  *** Baseline:  Goal status: INITIAL  3.  *** Baseline:  Goal status: INITIAL  4.  *** Baseline:  Goal status: INITIAL  5.  *** Baseline:  Goal status: INITIAL  6.  *** Baseline:  Goal status: INITIAL  PLAN:  PT FREQUENCY: 1-2x/week  PT DURATION: 6 weeks  PLANNED INTERVENTIONS: 97164- PT Re-evaluation, 97110-Therapeutic exercises, 97530- Therapeutic activity, 97112- Neuromuscular re-education, 97535- Self Care, 02859- Manual therapy, [id number]- Gait training, [id number]- Electrical stimulation (manual), [id number]- Ultrasound, 02987- Traction (mechanical), 20560 (1-2 muscles), 20561 (3+ muscles)- Dry Needling, Patient/Family education, Balance training, Stair training, Taping, Joint mobilization, Spinal mobilization, Cryotherapy, and Moist heat.  PLAN FOR NEXT SESSION: review HEP and goals,    Alex Lara E Powell-Butler, PT 01/15/2024, 10:19 AM   [employer] Auth Request Treatment Start Date: ***  Referring diagnosis code (ICD 10)? *** Treatment diagnosis codes (ICD 10)? (if different than referring diagnosis) *** What was this (referring dx) caused by? []  Surgery []  Fall []  Ongoing issue []  Arthritis []  Other: ____________  Laterality: []  Rt []  Lt []  Both  Deficits: []  Pain []  Stiffness []  Weakness []  Edema []  Balance Deficits []  Coordination []  Gait Disturbance []  ROM []  Other   Functional Tool Score: ***  CPT codes: See Planned Interventions listed in the Plan section of the Evaluation.   "

## 2024-01-17 ENCOUNTER — Telehealth: Payer: Self-pay

## 2024-01-17 ENCOUNTER — Other Ambulatory Visit: Payer: Self-pay | Admitting: Internal Medicine

## 2024-01-17 MED ORDER — FLUCONAZOLE 200 MG PO TABS: ORAL_TABLET | ORAL | 0 refills | Status: AC

## 2024-01-17 NOTE — Telephone Encounter (Signed)
 Stomach biopsies negative for H. pylori. Colon biopsies WNL.  Samples from his esophagus did confirm candidal esophagitis. This is a yeast infection. I have sent in a 14-day course of fluconazole  400 mg day 1 and then 200 mg daily for a total of 14 days.  Continue on pantoprazole  daily.  Follow-up as previously scheduled. Thank you.   Phoned the pt and LMOVM for the pt to return call.

## 2024-01-17 NOTE — Telephone Encounter (Signed)
 Sent message to Dr Cindie in secure chat.  Pt phoned regarding his results of his colonoscopy and EGD. Please advise

## 2024-01-18 ENCOUNTER — Telehealth (HOSPITAL_COMMUNITY): Payer: Self-pay

## 2024-01-18 ENCOUNTER — Ambulatory Visit (HOSPITAL_COMMUNITY): Attending: Orthopedic Surgery

## 2024-01-18 NOTE — Telephone Encounter (Signed)
 Called patient concerning missed appointment. Spoke with wife. Wife reports patient was in a lot of pain today. Still interested in PT services. Call transferred to front desk to reschedule.   3:20 PM, 01/18/2024 Rosaria Settler, PT, DPT [facility] [place]

## 2024-01-25 ENCOUNTER — Ambulatory Visit: Admitting: Pain Medicine

## 2024-01-29 ENCOUNTER — Other Ambulatory Visit (HOSPITAL_COMMUNITY): Payer: Self-pay | Admitting: Internal Medicine

## 2024-01-29 DIAGNOSIS — R52 Pain, unspecified: Secondary | ICD-10-CM

## 2024-02-01 ENCOUNTER — Ambulatory Visit: Admission: RE | Admit: 2024-02-01 | Source: Ambulatory Visit

## 2024-02-01 ENCOUNTER — Ambulatory Visit (HOSPITAL_BASED_OUTPATIENT_CLINIC_OR_DEPARTMENT_OTHER): Admitting: Pain Medicine

## 2024-02-01 VITALS — BP 135/87 | Temp 97.5°F

## 2024-02-01 DIAGNOSIS — Z5189 Encounter for other specified aftercare: Secondary | ICD-10-CM

## 2024-02-01 DIAGNOSIS — M542 Cervicalgia: Secondary | ICD-10-CM

## 2024-02-01 DIAGNOSIS — Z91199 Patient's noncompliance with other medical treatment and regimen due to unspecified reason: Secondary | ICD-10-CM

## 2024-02-01 MED ORDER — IOHEXOL 180 MG/ML  SOLN: 10.0000 mL | Freq: Once | INTRAMUSCULAR | Status: DC

## 2024-02-01 MED ORDER — SODIUM CHLORIDE 0.9% FLUSH: 1.0000 mL | Freq: Once | INTRAVENOUS | Status: DC

## 2024-02-01 MED ORDER — FENTANYL CITRATE (PF) 100 MCG/2ML IJ SOLN: 25.0000 ug | INTRAMUSCULAR | Status: DC | PRN

## 2024-02-01 MED ORDER — PENTAFLUOROPROP-TETRAFLUOROETH EX AERO: INHALATION_SPRAY | Freq: Once | CUTANEOUS | Status: DC

## 2024-02-01 MED ORDER — MIDAZOLAM HCL (PF) 2 MG/2ML IJ SOLN: 0.5000 mg | Freq: Once | INTRAMUSCULAR | Status: DC

## 2024-02-01 MED ORDER — DEXAMETHASONE SOD PHOSPHATE PF 10 MG/ML IJ SOLN: 10.0000 mg | Freq: Once | INTRAMUSCULAR | Status: DC

## 2024-02-01 MED ORDER — ROPIVACAINE HCL 2 MG/ML IJ SOLN: 1.0000 mL | Freq: Once | INTRAMUSCULAR | Status: DC

## 2024-02-01 MED ORDER — LIDOCAINE HCL 2 % IJ SOLN: 20.0000 mL | Freq: Once | INTRAMUSCULAR | Status: DC

## 2024-02-01 NOTE — Patient Instructions (Addendum)
 " ______________________________________________________________________    Preparing for your procedure  Appointments: If you think you may not be able to keep your appointment, call 24-48 hours in advance to cancel. We need time to make it available to others.  Procedure visits are for procedures only. During your procedure appointment there will be: NO Prescription Refills*. NO medication changes or discussions*. NO discussion of disability issues*. NO unrelated pain problem evaluations*. NO evaluations to order other pain procedures*. *These will be addressed at a separate and distinct evaluation encounter on the provider's evaluation schedule and not during procedure days.  Instructions: Food intake: Avoid eating anything solid for at least 8 hours prior to your procedure. Clear liquid intake: You may take clear liquids such as water  up to 2 hours prior to your procedure. (No carbonated drinks. No soda.) Transportation: Unless otherwise stated by your physician, bring a driver. (Driver cannot be a Market Researcher, Pharmacist, Community, or any other form of public transportation.) Morning Medicines: Except for blood thinners, take all of your other morning medications with a sip of water . Make sure to take your heart and blood pressure medicines. If your blood pressure's lower number is above 100, the case will be rescheduled. Blood thinners: Make sure to stop your blood thinners as instructed.  If you take a blood thinner, but were not instructed to stop it, call our office [phone] and ask to talk to a nurse. Not stopping a blood thinner prior to certain procedures could lead to serious complications. Diabetics on insulin: Notify the staff so that you can be scheduled 1st case in the morning. If your diabetes requires high dose insulin, take only  of your normal insulin dose the morning of the procedure and notify the staff that you have done so. Preventing infections: Shower with an antibacterial soap the  morning of your procedure.  Build-up your immune system: Take 1000 mg of Vitamin C with every meal (3 times a day) the day prior to your procedure. Antibiotics: Inform the nursing staff if you are taking any antibiotics or if you have any conditions that may require antibiotics prior to procedures. (Example: recent joint implants)   Pregnancy: If you are pregnant make sure to notify the nursing staff. Not doing so may result in injury to the fetus, including death.  Sickness: If you have a cold, fever, or any active infections, call and cancel or reschedule your procedure. Receiving steroids while having an infection may result in complications. Arrival: You must be in the facility at least 30 minutes prior to your scheduled procedure. Tardiness: Your scheduled time is also the cutoff time. If you do not arrive at least 15 minutes prior to your procedure, you will be rescheduled.  Children: Do not bring any children with you. Make arrangements to keep them home. Dress appropriately: There is always a possibility that your clothing may get soiled. Avoid long dresses. Valuables: Do not bring any jewelry or valuables.  Reasons to call and reschedule or cancel your procedure: (Following these recommendations will minimize the risk of a serious complication.) Surgeries: Avoid having procedures within 2 weeks of any surgery. (Avoid for 2 weeks before or after any surgery). Flu Shots: Avoid having procedures within 2 weeks of a flu shots or . (Avoid for 2 weeks before or after immunizations). Barium: Avoid having a procedure within 7-10 days after having had a radiological study involving the use of radiological contrast. (Myelograms, Barium swallow or enema study). Heart attacks: Avoid any elective procedures or surgeries for  the initial 6 months after a Myocardial Infarction (Heart Attack). Blood thinners: It is imperative that you stop these medications before procedures. Let us  know if you if you take  any blood thinner.  Infection: Avoid procedures during or within two weeks of an infection (including chest colds or gastrointestinal problems). Symptoms associated with infections include: Localized redness, fever, chills, night sweats or profuse sweating, burning sensation when voiding, cough, congestion, stuffiness, runny nose, sore throat, diarrhea, nausea, vomiting, cold or Flu symptoms, recent or current infections. It is specially important if the infection is over the area that we intend to treat. Heart and lung problems: Symptoms that may suggest an active cardiopulmonary problem include: cough, chest pain, breathing difficulties or shortness of breath, dizziness, ankle swelling, uncontrolled high or unusually low blood pressure, and/or palpitations. If you are experiencing any of these symptoms, cancel your procedure and contact your primary care physician for an evaluation.  Remember:  Regular Business hours are:  Monday to Thursday 8:00 AM to 4:00 PM  Provider's Schedule: Eric Como, MD:  Procedure days: Tuesday and Thursday 7:30 AM to 4:00 PM  Wallie Sherry, MD:  Procedure days: Monday and Wednesday 7:30 AM to 4:00 PM Last  Updated: 12/20/2022 ______________________________________________________________________   Stop Aspirin  7 days before procedure.  You may take a Baby Aspiring 81 mg  ______________________________________________________________________    Post-Procedure Discharge Instructions  INSTRUCTIONS Apply ice:  Purpose: This will minimize any swelling and discomfort after procedure.  When: Day of procedure, as soon as you get home. How: Fill a plastic sandwich bag with crushed ice. Cover it with a small towel and apply to injection site. How long: (15 min on, 15 min off) Apply for 15 minutes then remove x 15 minutes.  Repeat sequence on day of procedure, until you go to bed. Apply heat:  Purpose: To treat any soreness and discomfort from the  procedure. When: Starting the next day after the procedure. How: Apply heat to procedure site starting the day following the procedure. How long: May continue to repeat daily, until discomfort goes away. Food intake: Start with clear liquids (like water ) and advance to regular food, as tolerated.  Physical activities: Keep activities to a minimum for the first 8 hours after the procedure. After that, then as tolerated. Driving: If you have received any sedation, be responsible and do not drive. You are not allowed to drive for 24 hours after having sedation. Blood thinner: (Applies only to those taking blood thinners) You may restart your blood thinner 6 hours after your procedure. Insulin: (Applies only to Diabetic patients taking insulin) As soon as you can eat, you may resume your normal dosing schedule. Infection prevention: Keep procedure site clean and dry. Shower daily and clean area with soap and water .  PAIN DIARY Post-procedure Pain Diary: Extremely important that this be done correctly and accurately. Recorded information will be used to determine the next step in treatment. For the purpose of accuracy, follow these rules: Evaluate only the area treated. Do not report or include pain from an untreated area. For the purpose of this evaluation, ignore all other areas of pain, except for the treated area. After your procedure, avoid taking a long nap and attempting to complete the pain diary after you wake up. Instead, set your alarm clock to go off every hour, on the hour, for the initial 8 hours after the procedure. Document the duration of the numbing medicine, and the relief you are getting from it. Do not go  to sleep and attempt to complete it later. It will not be accurate. If you received sedation, it is likely that you were given a medication that may cause amnesia. Because of this, completing the diary at a later time may cause the information to be inaccurate. This information is  needed to plan your care. Follow-up appointment: Keep your post-procedure follow-up evaluation appointment after the procedure (usually 2 weeks for most procedures, 6 weeks for radiofrequencies). DO NOT FORGET to bring you pain diary with you.   EXPECT... (What should I expect to see with my procedure?) From numbing medicine (AKA: Local Anesthetics): Numbness or decrease in pain. You may also experience some weakness, which if present, could last for the duration of the local anesthetic. Onset: Full effect within 15 minutes of injected. Duration: It will depend on the type of local anesthetic used. On the average, 1 to 8 hours.  From steroids (Applies only if steroids were used): Decrease in swelling or inflammation. Once inflammation is improved, relief of the pain will follow. Onset of benefits: Depends on the amount of swelling present. The more swelling, the longer it will take for the benefits to be seen. In some cases, up to 10 days. Duration: Steroids will stay in the system x 2 weeks. Duration of benefits will depend on multiple posibilities including persistent irritating factors. Side-effects: If present, they may typically last 2 weeks (the duration of the steroids). Frequent: Cramps (if they occur, drink Gatorade and take over-the-counter Magnesium 450-500 mg once to twice a day); water  retention with temporary weight gain; increases in blood sugar; decreased immune system response; increased appetite. Occasional: Facial flushing (red, warm cheeks); mood swings; menstrual changes. Uncommon: Long-term decrease or suppression of natural hormones; bone thinning. (These are more common with higher doses or more frequent use. This is why we prefer that our patients avoid having any injection therapies in other practices.)  Very Rare: Severe mood changes; psychosis; aseptic necrosis. From procedure: Some discomfort is to be expected once the numbing medicine wears off. This should be minimal if  ice and heat are applied as instructed.  CALL IF... (When should I call?) You experience numbness and weakness that gets worse with time, as opposed to wearing off. New onset bowel or bladder incontinence. (Applies only to procedures done in the spine)  Emergency Numbers: Durning business hours (Monday - Thursday, 8:00 AM - 4:00 PM) (Friday, 9:00 AM - 12:00 Noon): [phone] After hours: [phone] NOTE: If you are having a problem and are unable connect with, or to talk to a provider, then go to your nearest urgent care or emergency department. If the problem is serious and urgent, please call 911. ______________________________________________________________________     ______________________________________________________________________    Steroid injections  Common steroids for injections Triamcinolone: Used by many sports medicine physicians for large joint and bursal injections, often combined with a local anesthetic like lidocaine . A study focusing on coccydynia (tailbone pain) found triamcinolone was more effective than betamethasone, suggesting it may also be preferable for other localized inflammation conditions. Methylprednisolone : A common alternative to triamcinolone that is also a strong anti-inflammatory. It is available in different formulations, with the acetate suspension being the long-acting option for intra-articular injections. Dexamethasone : This is a non-particulate steroid, meaning it has a lower risk of tissue damage compared to particulate steroids like triamcinolone and methylprednisolone . While less common for this specific use, it is an option for targeted injections.   Considerations for physicians Particulate vs. non-particulate steroids: Triamcinolone  and methylprednisolone  are particulate, meaning they can clump together. Dexamethasone  is non-particulate. Particulate steroids are often preferred for their longer-lasting effects but carry a  theoretical higher risk for certain injections (though this is less of a concern in the costochondral joints). Combined injectate: Corticosteroids are typically mixed with a local anesthetic like lidocaine  to provide both immediate pain relief (from the anesthetic) and longer-term inflammation reduction (from the steroid). Imaging guidance: To ensure accurate placement of the needle and medication, physicians may use ultrasound or fluoroscopic guidance for the injection, especially in complex or refractory cases.   Patient guidance Before undergoing a steroid injection, discuss the options with your physician. They will determine the best steroid, dosage, and procedure for your specific case based on factors like: Severity of your condition History of response to other treatments Your overall health status Experience and preference of the physician  Last  Updated: 09/05/2023 ______________________________________________________________________     "

## 2024-02-01 NOTE — Progress Notes (Signed)
 Department: [facility]) Date: 02/01/2024  Event: Canceled/Rescheduled by Provider.  Encounter Type: Procedure.          Advance notice: N/A.  Reason: The patient did not stop the blood thinner as instructed.          Significance: Delayed care.          Note: We'll R/S

## 2024-02-01 NOTE — Telephone Encounter (Signed)
 Pt came by the office today and he was given a copy of his result letter and result note from Dr Cindie. The results were highlighted in yellow for the pt.

## 2024-02-12 ENCOUNTER — Telehealth: Payer: Self-pay

## 2024-02-12 NOTE — Telephone Encounter (Signed)
 SABRA

## 2024-02-13 ENCOUNTER — Ambulatory Visit (HOSPITAL_BASED_OUTPATIENT_CLINIC_OR_DEPARTMENT_OTHER): Admitting: Pain Medicine

## 2024-02-13 ENCOUNTER — Ambulatory Visit
Admission: RE | Admit: 2024-02-13 | Discharge: 2024-02-13 | Disposition: A | Source: Ambulatory Visit | Attending: Pain Medicine | Admitting: Pain Medicine

## 2024-02-13 ENCOUNTER — Encounter: Payer: Self-pay | Admitting: Pain Medicine

## 2024-02-13 VITALS — BP 132/76 | HR 72 | Temp 96.8°F | Resp 20 | Ht 72.0 in | Wt 176.0 lb

## 2024-02-13 DIAGNOSIS — G8929 Other chronic pain: Secondary | ICD-10-CM

## 2024-02-13 DIAGNOSIS — M542 Cervicalgia: Secondary | ICD-10-CM

## 2024-02-13 DIAGNOSIS — S13140S Subluxation of C3/C4 cervical vertebrae, sequela: Secondary | ICD-10-CM

## 2024-02-13 DIAGNOSIS — G4486 Cervicogenic headache: Secondary | ICD-10-CM

## 2024-02-13 DIAGNOSIS — M4802 Spinal stenosis, cervical region: Secondary | ICD-10-CM

## 2024-02-13 DIAGNOSIS — M25519 Pain in unspecified shoulder: Secondary | ICD-10-CM

## 2024-02-13 DIAGNOSIS — M5030 Other cervical disc degeneration, unspecified cervical region: Secondary | ICD-10-CM

## 2024-02-13 DIAGNOSIS — M47812 Spondylosis without myelopathy or radiculopathy, cervical region: Secondary | ICD-10-CM

## 2024-02-13 MED ORDER — LIDOCAINE HCL 2 % IJ SOLN
20.0000 mL | Freq: Once | INTRAMUSCULAR | Status: AC
  Administered 2024-02-13: 400 mg
  Filled 2024-02-13: qty 20

## 2024-02-13 MED ORDER — SODIUM CHLORIDE 0.9% FLUSH
1.0000 mL | Freq: Once | INTRAVENOUS | Status: AC
  Administered 2024-02-13: 1 mL

## 2024-02-13 MED ORDER — DEXAMETHASONE SOD PHOSPHATE PF 10 MG/ML IJ SOLN
10.0000 mg | Freq: Once | INTRAMUSCULAR | Status: AC
  Administered 2024-02-13: 10 mg
  Filled 2024-02-13: qty 1

## 2024-02-13 MED ORDER — FENTANYL CITRATE (PF) 100 MCG/2ML IJ SOLN: 25.0000 ug | INTRAMUSCULAR | Status: DC | PRN

## 2024-02-13 MED ORDER — SODIUM CHLORIDE (PF) 0.9 % IJ SOLN
INTRAMUSCULAR | Status: AC
  Filled 2024-02-13: qty 10

## 2024-02-13 MED ORDER — IOHEXOL 180 MG/ML  SOLN
10.0000 mL | Freq: Once | INTRAMUSCULAR | Status: AC
  Administered 2024-02-13: 10 mL via EPIDURAL
  Filled 2024-02-13: qty 20

## 2024-02-13 MED ORDER — MIDAZOLAM HCL (PF) 2 MG/2ML IJ SOLN: 0.5000 mg | Freq: Once | INTRAMUSCULAR | Status: DC

## 2024-02-13 MED ORDER — PENTAFLUOROPROP-TETRAFLUOROETH EX AERO
INHALATION_SPRAY | Freq: Once | CUTANEOUS | Status: AC
  Administered 2024-02-13: 30 via TOPICAL

## 2024-02-13 MED ORDER — ROPIVACAINE HCL 2 MG/ML IJ SOLN
1.0000 mL | Freq: Once | INTRAMUSCULAR | Status: AC
  Administered 2024-02-13: 1 mL via EPIDURAL
  Filled 2024-02-13: qty 20

## 2024-02-13 NOTE — Patient Instructions (Signed)
 ______________________________________________________________________    Post-Procedure Discharge Instructions  INSTRUCTIONS Apply ice:  Purpose: This will minimize any swelling and discomfort after procedure.  When: Day of procedure, as soon as you get home. How: Fill a plastic sandwich bag with crushed ice. Cover it with a small towel and apply to injection site. How long: (15 min on, 15 min off) Apply for 15 minutes then remove x 15 minutes.  Repeat sequence on day of procedure, until you go to bed. Apply heat:  Purpose: To treat any soreness and discomfort from the procedure. When: Starting the next day after the procedure. How: Apply heat to procedure site starting the day following the procedure. How long: May continue to repeat daily, until discomfort goes away. Food intake: Start with clear liquids (like water) and advance to regular food, as tolerated.  Physical activities: Keep activities to a minimum for the first 8 hours after the procedure. After that, then as tolerated. Driving: If you have received any sedation, be responsible and do not drive. You are not allowed to drive for 24 hours after having sedation. Blood thinner: (Applies only to those taking blood thinners) You may restart your blood thinner 6 hours after your procedure. Insulin: (Applies only to Diabetic patients taking insulin) As soon as you can eat, you may resume your normal dosing schedule. Infection prevention: Keep procedure site clean and dry. Shower daily and clean area with soap and water.  PAIN DIARY Post-procedure Pain Diary: Extremely important that this be done correctly and accurately. Recorded information will be used to determine the next step in treatment. For the purpose of accuracy, follow these rules: Evaluate only the area treated. Do not report or include pain from an untreated area. For the purpose of this evaluation, ignore all other areas of pain, except for the treated area. After your  procedure, avoid taking a long nap and attempting to complete the pain diary after you wake up. Instead, set your alarm clock to go off every hour, on the hour, for the initial 8 hours after the procedure. Document the duration of the numbing medicine, and the relief you are getting from it. Do not go to sleep and attempt to complete it later. It will not be accurate. If you received sedation, it is likely that you were given a medication that may cause amnesia. Because of this, completing the diary at a later time may cause the information to be inaccurate. This information is needed to plan your care. Follow-up appointment: Keep your post-procedure follow-up evaluation appointment after the procedure (usually 2 weeks for most procedures, 6 weeks for radiofrequencies). DO NOT FORGET to bring you pain diary with you.   EXPECT... (What should I expect to see with my procedure?) From numbing medicine (AKA: Local Anesthetics): Numbness or decrease in pain. You may also experience some weakness, which if present, could last for the duration of the local anesthetic. Onset: Full effect within 15 minutes of injected. Duration: It will depend on the type of local anesthetic used. On the average, 1 to 8 hours.  From steroids (Applies only if steroids were used): Decrease in swelling or inflammation. Once inflammation is improved, relief of the pain will follow. Onset of benefits: Depends on the amount of swelling present. The more swelling, the longer it will take for the benefits to be seen. In some cases, up to 10 days. Duration: Steroids will stay in the system x 2 weeks. Duration of benefits will depend on multiple posibilities including persistent irritating  factors. Side-effects: If present, they may typically last 2 weeks (the duration of the steroids). Frequent: Cramps (if they occur, drink Gatorade and take over-the-counter Magnesium 450-500 mg once to twice a day); water retention with temporary weight  gain; increases in blood sugar; decreased immune system response; increased appetite. Occasional: Facial flushing (red, warm cheeks); mood swings; menstrual changes. Uncommon: Long-term decrease or suppression of natural hormones; bone thinning. (These are more common with higher doses or more frequent use. This is why we prefer that our patients avoid having any injection therapies in other practices.)  Very Rare: Severe mood changes; psychosis; aseptic necrosis. From procedure: Some discomfort is to be expected once the numbing medicine wears off. This should be minimal if ice and heat are applied as instructed.  CALL IF... (When should I call?) You experience numbness and weakness that gets worse with time, as opposed to wearing off. New onset bowel or bladder incontinence. (Applies only to procedures done in the spine)  Emergency Numbers: Durning business hours (Monday - Thursday, 8:00 AM - 4:00 PM) (Friday, 9:00 AM - 12:00 Noon): [phone] After hours: [phone] NOTE: If you are having a problem and are unable connect with, or to talk to a provider, then go to your nearest urgent care or emergency department. If the problem is serious and urgent, please call 911. ______________________________________________________________________     ______________________________________________________________________    Steroid injections  Common steroids for injections Triamcinolone : Used by many sports medicine physicians for large joint and bursal injections, often combined with a local anesthetic like lidocaine . A study focusing on coccydynia (tailbone pain) found triamcinolone  was more effective than betamethasone, suggesting it may also be preferable for other localized inflammation conditions. Methylprednisolone: A common alternative to triamcinolone  that is also a strong anti-inflammatory. It is available in different formulations, with the acetate suspension being the long-acting  option for intra-articular injections. Dexamethasone : This is a non-particulate steroid, meaning it has a lower risk of tissue damage compared to particulate steroids like triamcinolone  and methylprednisolone. While less common for this specific use, it is an option for targeted injections.   Considerations for physicians Particulate vs. non-particulate steroids: Triamcinolone  and methylprednisolone are particulate, meaning they can clump together. Dexamethasone  is non-particulate. Particulate steroids are often preferred for their longer-lasting effects but carry a theoretical higher risk for certain injections (though this is less of a concern in the costochondral joints). Combined injectate: Corticosteroids are typically mixed with a local anesthetic like lidocaine  to provide both immediate pain relief (from the anesthetic) and longer-term inflammation reduction (from the steroid). Imaging guidance: To ensure accurate placement of the needle and medication, physicians may use ultrasound or fluoroscopic guidance for the injection, especially in complex or refractory cases.   Patient guidance Before undergoing a steroid injection, discuss the options with your physician. They will determine the best steroid, dosage, and procedure for your specific case based on factors like: Severity of your condition History of response to other treatments Your overall health status Experience and preference of the physician  Last  Updated: 09/05/2023 ______________________________________________________________________

## 2024-02-14 ENCOUNTER — Telehealth: Payer: Self-pay | Admitting: *Deleted

## 2024-02-14 NOTE — Telephone Encounter (Signed)
 Post procedure call; spoke with his wife, reports he is doing well.  No questions or concerns.

## 2024-02-16 ENCOUNTER — Ambulatory Visit: Payer: Self-pay

## 2024-03-04 ENCOUNTER — Ambulatory Visit: Admitting: Pain Medicine
